# Patient Record
Sex: Female | Born: 1935 | Race: White | Hispanic: No | Smoking: Never smoker
Health system: Southern US, Community
[De-identification: ages and names within clinical notes are randomized; demographics above are authoritative.]

## PROBLEM LIST (undated history)

## (undated) DIAGNOSIS — G20 Parkinson's disease: Secondary | ICD-10-CM

## (undated) DIAGNOSIS — M6281 Muscle weakness (generalized): Secondary | ICD-10-CM

## (undated) DIAGNOSIS — I3139 Other pericardial effusion (noninflammatory): Secondary | ICD-10-CM

## (undated) DIAGNOSIS — F0390 Unspecified dementia without behavioral disturbance: Secondary | ICD-10-CM

## (undated) DIAGNOSIS — I639 Cerebral infarction, unspecified: Secondary | ICD-10-CM

## (undated) DIAGNOSIS — I313 Pericardial effusion (noninflammatory): Secondary | ICD-10-CM

## (undated) DIAGNOSIS — F329 Major depressive disorder, single episode, unspecified: Secondary | ICD-10-CM

## (undated) DIAGNOSIS — N183 Chronic kidney disease, stage 3 unspecified: Secondary | ICD-10-CM

## (undated) DIAGNOSIS — I10 Essential (primary) hypertension: Secondary | ICD-10-CM

## (undated) DIAGNOSIS — G20A1 Parkinson's disease without dyskinesia, without mention of fluctuations: Secondary | ICD-10-CM

## (undated) DIAGNOSIS — G9340 Encephalopathy, unspecified: Secondary | ICD-10-CM

## (undated) DIAGNOSIS — G459 Transient cerebral ischemic attack, unspecified: Secondary | ICD-10-CM

## (undated) DIAGNOSIS — F32A Depression, unspecified: Secondary | ICD-10-CM

## (undated) DIAGNOSIS — I69390 Apraxia following cerebral infarction: Secondary | ICD-10-CM

## (undated) DIAGNOSIS — E785 Hyperlipidemia, unspecified: Secondary | ICD-10-CM

## (undated) DIAGNOSIS — D509 Iron deficiency anemia, unspecified: Secondary | ICD-10-CM

## (undated) DIAGNOSIS — N390 Urinary tract infection, site not specified: Secondary | ICD-10-CM

## (undated) DIAGNOSIS — R001 Bradycardia, unspecified: Secondary | ICD-10-CM

## (undated) DIAGNOSIS — E875 Hyperkalemia: Secondary | ICD-10-CM

## (undated) HISTORY — PX: ABDOMINAL HYSTERECTOMY: SHX81

## (undated) HISTORY — PX: CHOLECYSTECTOMY: SHX55

## (undated) HISTORY — PX: APPENDECTOMY: SHX54

---

## 2008-06-29 ENCOUNTER — Other Ambulatory Visit: Payer: Self-pay

## 2008-06-29 ENCOUNTER — Inpatient Hospital Stay: Payer: Self-pay | Admitting: Internal Medicine

## 2008-07-05 ENCOUNTER — Other Ambulatory Visit: Payer: Self-pay

## 2008-07-05 ENCOUNTER — Emergency Department: Payer: Self-pay | Admitting: Internal Medicine

## 2011-12-05 ENCOUNTER — Emergency Department: Payer: Self-pay | Admitting: Emergency Medicine

## 2014-05-03 ENCOUNTER — Inpatient Hospital Stay: Payer: Self-pay | Admitting: Internal Medicine

## 2014-05-03 LAB — CK TOTAL AND CKMB (NOT AT ARMC)
CK, Total: 88 U/L
CK-MB: <0.5 ng/mL — ABNORMAL LOW (ref 0.5–3.6)

## 2014-05-03 LAB — URINALYSIS, COMPLETE
Bacteria: NONE SEEN
Bilirubin,UR: NEGATIVE
Glucose,UR: NEGATIVE mg/dL (ref 0–75)
Leukocyte Esterase: NEGATIVE
NITRITE: NEGATIVE
PH: 8 (ref 4.5–8.0)
RBC,UR: 44 /HPF (ref 0–5)
Specific Gravity: 1.008 (ref 1.003–1.030)
Squamous Epithelial: <1
WBC UR: 2 /HPF (ref 0–5)

## 2014-05-03 LAB — CBC
HCT: 39 % (ref 35.0–47.0)
HGB: 12.6 g/dL (ref 12.0–16.0)
MCH: 26.9 pg (ref 26.0–34.0)
MCHC: 32.3 g/dL (ref 32.0–36.0)
MCV: 83 fL (ref 80–100)
Platelet: 149 10*3/uL — ABNORMAL LOW (ref 150–440)
RBC: 4.69 10*6/uL (ref 3.80–5.20)
RDW: 19.6 % — AB (ref 11.5–14.5)
WBC: 8.6 10*3/uL (ref 3.6–11.0)

## 2014-05-03 LAB — COMPREHENSIVE METABOLIC PANEL
ALK PHOS: 70 U/L
ALT: 12 U/L (ref 12–78)
Albumin: 3.5 g/dL (ref 3.4–5.0)
Anion Gap: 7 (ref 7–16)
BUN: 20 mg/dL — AB (ref 7–18)
Bilirubin,Total: 0.5 mg/dL (ref 0.2–1.0)
Calcium, Total: 8.4 mg/dL — ABNORMAL LOW (ref 8.5–10.1)
Chloride: 109 mmol/L — ABNORMAL HIGH (ref 98–107)
Co2: 24 mmol/L (ref 21–32)
Creatinine: 1.09 mg/dL (ref 0.60–1.30)
EGFR (African American): 56 — ABNORMAL LOW
GFR CALC NON AF AMER: 48 — AB
GLUCOSE: 97 mg/dL (ref 65–99)
Osmolality: 282 (ref 275–301)
POTASSIUM: 4.4 mmol/L (ref 3.5–5.1)
SGOT(AST): 29 U/L (ref 15–37)
Sodium: 140 mmol/L (ref 136–145)
Total Protein: 7.5 g/dL (ref 6.4–8.2)

## 2014-05-03 LAB — TROPONIN I: Troponin-I: <0.02 ng/mL

## 2014-05-04 ENCOUNTER — Ambulatory Visit: Payer: Self-pay | Admitting: Neurology

## 2014-05-04 LAB — LIPID PANEL
CHOLESTEROL: 157 mg/dL (ref 0–200)
HDL: 79 mg/dL — AB (ref 40–60)
Ldl Cholesterol, Calc: 68 mg/dL (ref 0–100)
Triglycerides: 52 mg/dL (ref 0–200)
VLDL Cholesterol, Calc: 10 mg/dL (ref 5–40)

## 2014-05-05 LAB — PHENYTOIN LEVEL, TOTAL: DILANTIN: 14 ug/mL (ref 10.0–20.0)

## 2014-05-05 LAB — AMMONIA: AMMONIA, PLASMA: 11 umol/L (ref 11–32)

## 2014-05-11 ENCOUNTER — Emergency Department: Payer: Self-pay | Admitting: Emergency Medicine

## 2014-05-11 LAB — COMPREHENSIVE METABOLIC PANEL
ALT: 14 U/L
ANION GAP: 7 (ref 7–16)
Albumin: 3.1 g/dL — ABNORMAL LOW (ref 3.4–5.0)
Alkaline Phosphatase: 69 U/L
BUN: 39 mg/dL — ABNORMAL HIGH (ref 7–18)
Bilirubin,Total: 0.3 mg/dL (ref 0.2–1.0)
Calcium, Total: 8.1 mg/dL — ABNORMAL LOW (ref 8.5–10.1)
Chloride: 105 mmol/L (ref 98–107)
Co2: 27 mmol/L (ref 21–32)
Creatinine: 1.38 mg/dL — ABNORMAL HIGH (ref 0.60–1.30)
EGFR (African American): 42 — ABNORMAL LOW
EGFR (Non-African Amer.): 36 — ABNORMAL LOW
Glucose: 101 mg/dL — ABNORMAL HIGH (ref 65–99)
OSMOLALITY: 287 (ref 275–301)
Potassium: 5 mmol/L (ref 3.5–5.1)
SGOT(AST): 27 U/L (ref 15–37)
SODIUM: 139 mmol/L (ref 136–145)
Total Protein: 6.7 g/dL (ref 6.4–8.2)

## 2014-05-11 LAB — CBC
HCT: 35.9 % (ref 35.0–47.0)
HGB: 11.5 g/dL — ABNORMAL LOW (ref 12.0–16.0)
MCH: 26.9 pg (ref 26.0–34.0)
MCHC: 32 g/dL (ref 32.0–36.0)
MCV: 84 fL (ref 80–100)
Platelet: 176 10*3/uL (ref 150–440)
RBC: 4.27 10*6/uL (ref 3.80–5.20)
RDW: 18.8 % — AB (ref 11.5–14.5)
WBC: 7.2 10*3/uL (ref 3.6–11.0)

## 2014-05-30 ENCOUNTER — Inpatient Hospital Stay: Payer: Self-pay | Admitting: Internal Medicine

## 2014-05-30 LAB — BASIC METABOLIC PANEL
Anion Gap: 7 (ref 7–16)
BUN: 24 mg/dL — ABNORMAL HIGH (ref 7–18)
CALCIUM: 8.8 mg/dL (ref 8.5–10.1)
CREATININE: 1.63 mg/dL — AB (ref 0.60–1.30)
Chloride: 106 mmol/L (ref 98–107)
Co2: 26 mmol/L (ref 21–32)
EGFR (African American): 34 — ABNORMAL LOW
EGFR (Non-African Amer.): 29 — ABNORMAL LOW
Glucose: 169 mg/dL — ABNORMAL HIGH (ref 65–99)
OSMOLALITY: 286 (ref 275–301)
POTASSIUM: 4.1 mmol/L (ref 3.5–5.1)
SODIUM: 139 mmol/L (ref 136–145)

## 2014-05-30 LAB — CBC WITH DIFFERENTIAL/PLATELET
BASOS ABS: 0.1 10*3/uL (ref 0.0–0.1)
Basophil %: 0.5 %
EOS ABS: 0.1 10*3/uL (ref 0.0–0.7)
Eosinophil %: 0.8 %
HCT: 37.4 % (ref 35.0–47.0)
HGB: 11.9 g/dL — ABNORMAL LOW (ref 12.0–16.0)
LYMPHS PCT: 9.5 %
Lymphocyte #: 1.1 10*3/uL (ref 1.0–3.6)
MCH: 27.2 pg (ref 26.0–34.0)
MCHC: 31.8 g/dL — AB (ref 32.0–36.0)
MCV: 86 fL (ref 80–100)
MONO ABS: 1 x10 3/mm — AB (ref 0.2–0.9)
Monocyte %: 8.1 %
Neutrophil #: 9.6 10*3/uL — ABNORMAL HIGH (ref 1.4–6.5)
Neutrophil %: 81.1 %
Platelet: 189 10*3/uL (ref 150–440)
RBC: 4.37 10*6/uL (ref 3.80–5.20)
RDW: 17.1 % — ABNORMAL HIGH (ref 11.5–14.5)
WBC: 11.8 10*3/uL — ABNORMAL HIGH (ref 3.6–11.0)

## 2014-05-30 LAB — URINALYSIS, COMPLETE
BACTERIA: NONE SEEN
BILIRUBIN, UR: NEGATIVE
Glucose,UR: NEGATIVE mg/dL (ref 0–75)
Nitrite: NEGATIVE
Ph: 5 (ref 4.5–8.0)
Protein: 100
RBC,UR: 48 /HPF (ref 0–5)
SQUAMOUS EPITHELIAL: NONE SEEN
Specific Gravity: 1.015 (ref 1.003–1.030)
WBC UR: 3064 /HPF (ref 0–5)

## 2014-05-30 LAB — TROPONIN I

## 2014-05-30 LAB — CLOSTRIDIUM DIFFICILE(ARMC)

## 2014-05-31 LAB — BASIC METABOLIC PANEL
ANION GAP: 6 — AB (ref 7–16)
BUN: 26 mg/dL — ABNORMAL HIGH (ref 7–18)
CREATININE: 1.62 mg/dL — AB (ref 0.60–1.30)
Calcium, Total: 8 mg/dL — ABNORMAL LOW (ref 8.5–10.1)
Chloride: 107 mmol/L (ref 98–107)
Co2: 25 mmol/L (ref 21–32)
EGFR (African American): 34 — ABNORMAL LOW
GFR CALC NON AF AMER: 30 — AB
Glucose: 112 mg/dL — ABNORMAL HIGH (ref 65–99)
Osmolality: 281 (ref 275–301)
Potassium: 3.5 mmol/L (ref 3.5–5.1)
SODIUM: 138 mmol/L (ref 136–145)

## 2014-05-31 LAB — CBC WITH DIFFERENTIAL/PLATELET
Basophil #: 0 10*3/uL (ref 0.0–0.1)
Basophil %: 0.4 %
EOS ABS: 0.2 10*3/uL (ref 0.0–0.7)
Eosinophil %: 2.4 %
HCT: 33.5 % — ABNORMAL LOW (ref 35.0–47.0)
HGB: 10.8 g/dL — AB (ref 12.0–16.0)
Lymphocyte #: 1.8 10*3/uL (ref 1.0–3.6)
Lymphocyte %: 18.7 %
MCH: 27.7 pg (ref 26.0–34.0)
MCHC: 32.3 g/dL (ref 32.0–36.0)
MCV: 86 fL (ref 80–100)
MONO ABS: 1.1 x10 3/mm — AB (ref 0.2–0.9)
Monocyte %: 11.2 %
Neutrophil #: 6.6 10*3/uL — ABNORMAL HIGH (ref 1.4–6.5)
Neutrophil %: 67.3 %
PLATELETS: 156 10*3/uL (ref 150–440)
RBC: 3.92 10*6/uL (ref 3.80–5.20)
RDW: 16.8 % — ABNORMAL HIGH (ref 11.5–14.5)
WBC: 9.8 10*3/uL (ref 3.6–11.0)

## 2014-06-02 LAB — URINE CULTURE

## 2014-06-04 LAB — CULTURE, BLOOD (SINGLE)

## 2015-01-12 ENCOUNTER — Inpatient Hospital Stay: Payer: Self-pay | Admitting: Internal Medicine

## 2015-01-12 LAB — COMPREHENSIVE METABOLIC PANEL
ALK PHOS: 76 U/L
ANION GAP: 8 (ref 7–16)
Albumin: 4.1 g/dL
BUN: 36 mg/dL — ABNORMAL HIGH
Bilirubin,Total: 0.7 mg/dL
CALCIUM: 9.2 mg/dL
Chloride: 107 mmol/L
Co2: 25 mmol/L
Creatinine: 1.62 mg/dL — ABNORMAL HIGH
EGFR (African American): 34 — ABNORMAL LOW
GFR CALC NON AF AMER: 30 — AB
Glucose: 130 mg/dL — ABNORMAL HIGH
Potassium: 4.6 mmol/L
SGOT(AST): 23 U/L
Sodium: 140 mmol/L
Total Protein: 7 g/dL

## 2015-01-12 LAB — URINALYSIS, COMPLETE
BILIRUBIN, UR: NEGATIVE
GLUCOSE, UR: NEGATIVE mg/dL (ref 0–75)
NITRITE: NEGATIVE
Ph: 5 (ref 4.5–8.0)
Protein: 30
RBC,UR: NONE SEEN /HPF (ref 0–5)
Specific Gravity: 1.018 (ref 1.003–1.030)
Squamous Epithelial: 3

## 2015-01-12 LAB — CBC
HCT: 34.7 % — ABNORMAL LOW (ref 35.0–47.0)
HGB: 11.8 g/dL — ABNORMAL LOW (ref 12.0–16.0)
MCH: 31.1 pg (ref 26.0–34.0)
MCHC: 34 g/dL (ref 32.0–36.0)
MCV: 91 fL (ref 80–100)
Platelet: 144 10*3/uL — ABNORMAL LOW (ref 150–440)
RBC: 3.8 10*6/uL (ref 3.80–5.20)
RDW: 14.6 % — ABNORMAL HIGH (ref 11.5–14.5)
WBC: 8.2 10*3/uL (ref 3.6–11.0)

## 2015-01-12 LAB — TROPONIN I: Troponin-I: 0.03 ng/mL

## 2015-01-13 LAB — BASIC METABOLIC PANEL
ANION GAP: 9 (ref 7–16)
BUN: 41 mg/dL — ABNORMAL HIGH
CALCIUM: 9 mg/dL
CO2: 25 mmol/L
Chloride: 108 mmol/L
Creatinine: 1.71 mg/dL — ABNORMAL HIGH
EGFR (African American): 32 — ABNORMAL LOW
EGFR (Non-African Amer.): 28 — ABNORMAL LOW
GLUCOSE: 116 mg/dL — AB
Potassium: 4.4 mmol/L
Sodium: 142 mmol/L

## 2015-01-13 LAB — CBC WITH DIFFERENTIAL/PLATELET
Basophil #: 0.1 10*3/uL (ref 0.0–0.1)
Basophil %: 0.6 %
EOS PCT: 0.4 %
Eosinophil #: 0 10*3/uL (ref 0.0–0.7)
HCT: 31.4 % — ABNORMAL LOW (ref 35.0–47.0)
HGB: 10.7 g/dL — ABNORMAL LOW (ref 12.0–16.0)
Lymphocyte #: 1.6 10*3/uL (ref 1.0–3.6)
Lymphocyte %: 17.6 %
MCH: 31.2 pg (ref 26.0–34.0)
MCHC: 34.1 g/dL (ref 32.0–36.0)
MCV: 92 fL (ref 80–100)
Monocyte #: 1.1 x10 3/mm — ABNORMAL HIGH (ref 0.2–0.9)
Monocyte %: 12.3 %
NEUTROS ABS: 6.2 10*3/uL (ref 1.4–6.5)
NEUTROS PCT: 69.1 %
Platelet: 125 10*3/uL — ABNORMAL LOW (ref 150–440)
RBC: 3.43 10*6/uL — AB (ref 3.80–5.20)
RDW: 14.6 % — ABNORMAL HIGH (ref 11.5–14.5)
WBC: 9 10*3/uL (ref 3.6–11.0)

## 2015-01-14 LAB — BASIC METABOLIC PANEL
ANION GAP: 8 (ref 7–16)
BUN: 34 mg/dL — AB
Calcium, Total: 8.7 mg/dL — ABNORMAL LOW
Chloride: 112 mmol/L — ABNORMAL HIGH
Co2: 23 mmol/L
Creatinine: 1.38 mg/dL — ABNORMAL HIGH
EGFR (Non-African Amer.): 36 — ABNORMAL LOW
GFR CALC AF AMER: 42 — AB
Glucose: 89 mg/dL
Potassium: 4 mmol/L
SODIUM: 143 mmol/L

## 2015-01-15 LAB — BASIC METABOLIC PANEL
ANION GAP: 10 (ref 7–16)
BUN: 24 mg/dL — ABNORMAL HIGH
CHLORIDE: 112 mmol/L — AB
Calcium, Total: 8.8 mg/dL — ABNORMAL LOW
Co2: 22 mmol/L
Creatinine: 1.09 mg/dL — ABNORMAL HIGH
EGFR (African American): 56 — ABNORMAL LOW
EGFR (Non-African Amer.): 48 — ABNORMAL LOW
Glucose: 86 mg/dL
Potassium: 4.6 mmol/L
SODIUM: 144 mmol/L

## 2015-01-15 LAB — URINE CULTURE

## 2015-01-17 ENCOUNTER — Emergency Department
Admit: 2015-01-17 | Disposition: A | Discharge status: Home / Self Care | Payer: Self-pay | Admitting: Emergency Medicine

## 2015-01-22 ENCOUNTER — Emergency Department
Admit: 2015-01-22 | Disposition: A | Discharge status: Skilled Nursing Facility | Payer: Self-pay | Admitting: Emergency Medicine

## 2015-02-10 NOTE — Discharge Summary (Signed)
PATIENT NAME:  Stacey Baker, Stacey Baker DATE OF BIRTH:  05-19-1932  DATE OF ADMISSION:  05/03/2014 DATE OF DISCHARGE:  05/05/2014  ADMITTING DIAGNOSIS: Altered mental status.  DISCHARGE DIAGNOSES: 1. Seizure. 2. Malignant hypertension. 3. Postictal encephalopathy, resolved. 4. Acute urinary retention resolved. 5. Generalized weakness. 6. Dementia with behavioral issues. 7. History of stroke with right upper extremity weakness. 8. Slurring speech.  9. Parkinson disease with dementia. 10. Hypertension. 11. Hyperlipidemia.  DISCHARGE CONDITION: Stable.  DISCHARGE MEDICATIONS: The patient is to continue vitamin B12 at 1000 mcg p.o. daily, iron sulfate 325 mg p.o. twice daily, metoprolol tartrate 25 mg p.o. twice daily, carbidopa/levodopa 25/100 one tablet 4 times daily, Tylenol 325 mg 2 tablets every 4 hours as needed, Robafen 5 mL every 4 hours as needed, vancomycin 250 mg in 5 mL oral solution 10 mL 3 times daily as previously scheduled, lisinopril 10 mg p.o. twice daily, this is a new medication. Aspirin 81 mg p.o. daily, this is new medication, and Lipitor 10 mg p.o. at bedtime.   HOME OXYGEN: None.   DIET: 2-gram salt, low-fat, low-cholesterol. Diet consistency: Mechanical soft.   ACTIVITY LIMITATIONS: As tolerated.   REFERRAL: To home health, physical therapy.  FOLLOWUP APPOINTMENT: With primary care physician in 2 days after discharge.  CONSULTANTS: Care management, social work, Dr. Loretha BrasilZeylikman, neurologist. Electroencephalogram done on 05/04/2014 revealed abnormal awake and drowsy electroencephalogram with no indication of epilepsy. There is intermittent generalized slowing according to neurologist.   RADIOLOGIC STUDIES: Chest portable single view x-ray 05/03/2014, showed no active disease. One view x-ray on 05/03/2014 post seizure revealed bibasilar atelectasis, but no acute infiltrates and stable cardiomegaly. CT scan of head without contrast, 05/03/2014 showed chronic  atrophy and change with no acute findings. CT of head without contrast repeated on 05/03/2014 after seizures revealed again no acute abnormality. MRI of the brain 05/04/2014 without contrast revealed motion degraded exam demonstrated no definite acute intracranial abnormality. Chronic changes were noted.   HOSPITAL COURSE: The patient is a 81 year old Caucasian female with a past medical history significant for history of Parkinson disease, history of dementia, who presents to the hospital with confusion. Please refer to Dr. Jarrett SohoHower's admission note on 05/03/2014. Apparently the patient was having worsening of slurring speech and was brought to the Emergency Room for further evaluation. She had witnessed seizure and was postictal when she was seen later on. She was also noted to be markedly hypertensive with systolic blood pressure reaching 234 initially. Her physical exam was unremarkable.   LABORATORY DATA: The patient's lab data done on arrival to the hospital 05/03/2014 revealed mild elevation of BUN to 20, otherwise BMP was unremarkable. The patient's liver enzymes were normal. Cardiac enzymes: First set negative. The patient's CBC was within normal limits with white blood cell count 8.2, hemoglobin 12.6, platelet count 149,000. Urinalysis was remarkable for 44 red blood cells, 2 white blood cells. More 500 protein, 2+ blood. Otherwise negative for nitrites or leukocyte esterase.  The patient's EKG showed normal sinus rhythm at 79 beats per minute, normal axis, no significant changes were noted. The patient was admitted to the hospital for further evaluation and she was loaded with Dilantin IV. She was seen by a neurologist, Dr. Loretha BrasilZeylikman, the next day on 05/04/2014 and underwent electroencephalogram as well as MRI of the brain. MRI of the brain did not show any acute stroke, electroencephalogram was negative for seizure activity. Neurologist saw patient in consultation, felt that this a suspected first  time seizure for the  patient, who does not have any history of seizures. He recommended to hold off any antiepileptic medication specifically in the setting with MRI not showing any acute abnormalities. According to Dr. Loretha Brasil, blood pressure control should be done and follow up with neurology as outpatient in the next 3-6 weeks after discharge. The patient did very well as time progressed with medical therapy. She was much more alert the next day on 05/05/2014 and was felt to be ready to discharge back to facility. She was evaluated by physical therapist and recommended moderate assistance on the day of discharge.   VITALS SIGNS: Her vitals were stable with temperature of 97.6, pulse was 85, respiration rate was 18, creatinine 13, blood pressure 133/76, saturation was 96% on room air at rest.   The patient is being discharged to assisted living facility with the above-mentioned medications and followup. In regards to blood pressure management, her blood pressure was very poorly controlled on arrival to the hospital, could be stress related. The patient was initiated on new blood pressure medications, lisinopril 10 mg twice daily dose. It is recommended to advance lisinopril as needed to get her blood pressure under control. In regards to history of stroke, the patient was advised to continue aspirin as well as Lipitor. For history of dementia with behavioral issues, the patient is to follow up with her primary care physician for further recommendations. For Parkinson disease, she is to continue her outpatient medications. She is being discharged with the above-mentioned medications and followup.  TIME SPENT: 40 minutes.     ____________________________ Katharina Caper, MD rv:lt D: 05/05/2014 20:59:27 ET T: 05/06/2014 06:01:08 ET JOB#: [id number]  cc: Katharina Caper, MD, <Dictator> Keyra Virella MD ELECTRONICALLY SIGNED 05/26/2014 15:09

## 2015-02-10 NOTE — H&P (Signed)
PATIENT NAME:  Glorianne ManchesterREIER, Corie MR#:  [id number] DATE OF BIRTH:  1932-08-04  DATE OF ADMISSION:  05/03/2014  REFERRING PHYSICIAN:  Dr. Lenard LancePaduchowski   PRIMARY CARE PHYSICIAN: Nonlocal.  [facility]   CHIEF COMPLAINT: Worsening slurred speech.   HISTORY OF PRESENT ILLNESS:  This 81 year old Caucasian female with a history of Parkinson's, dementia, CVA with residual right upper extremity weakness, as well as history of slurred speech, hypertension, hyperlipidemia, presenting with worsening slurred speech. Unable to provide any information given current mental status.  In the Emergency Department, she had a witnessed seizure and has essentially been postictal from that time. On arrival, she was noted to be markedly hypertensive, blood pressure 234/90 which has subsequently improved. Once again, unable to provide any meaningful information given the current mental status.   REVIEW OF SYSTEMS:  Unable to obtain given mental status.   PAST MEDICAL HISTORY: Parkinson's, CVA with right upper extremity weakness, as well as slurred speech at baseline, hypertension, hyperlipidemia.   SOCIAL HISTORY: Note remote tobacco abuse. No alcohol or drug use documented.   FAMILY HISTORY: Positive for lung cancer as well as diabetes.   ALLERGIES: TO PENICILLIN AND PLAVIX.   HOME MEDICATIONS: Include MPAP 325 two tablets p.o. q. 4 hours as needed for pain, carbidopa levodopa 25/100 mg 1 tablet p.o. 4 times a day, metoprolol 25 mg p.o. b.i.d., Robafen 5 mg q. 4 hours as needed for cough, vancomycin 250 mg/mL 10 mL 3 times daily, Feosol 325 mg p.o. b.i.d., vitamin B12 at 1000 mcg p.o. daily.   VITAL SIGNS: Temperature 99.6, heart rate 79, respirations 16, blood pressure 234/90 on arrival, currently 171/93, saturating (Dictation Anomaly) on room air. Weight 63.5 kg, BMI 22.6.   PHYSICAL EXAMINATION: GENERAL: Ill-appearing Caucasian female, currently in minimal to moderate distress given  mental status.  HEAD: Normocephalic, atraumatic.  EYES: Pupils equal, round, and reactive to light. Extraocular muscles unable to fully assess. No scleral icterus.  MOUTH: Moist mucosal membrane. Dentition intact. No abscess noted.  EARS, NOSE, AND THROAT:  Clear without exudates. No external lesions.  NECK: Supple. No thyromegaly. No nodules. No JVD.  PULMONARY: Clear to auscultation bilaterally without wheezes, rales, rhonchi.  No use of accessory muscles. Good respiratory effort.  CHEST: Nontender to palpation.  CARDIOVASCULAR: S1, S2, regular rate and rhythm. No murmurs, rubs, or gallops. No edema. Pedal pulses 2+ bilaterally.  GASTROINTESTINAL: Soft, nontender, nondistended. No masses. Positive bowel sounds. No hepatosplenomegaly.  MUSCULOSKELETAL: No swelling, clubbing, or edema. Right upper extremity contraction. Otherwise passive range of motion full in all extremities.  NEUROLOGICAL: Unable to fully assess given the patient's current mental status. She is mumbling incoherently, unable to follow any commands, unable to hold any conversation. She does have spontaneous movement of all extremities. Reflexes intact.  SKIN: No ulcerations, lesions, rash or cyanosis. Skin warm, dry. Turgor intact.  PSYCHIATRIC: Unable to fully assess given patient's mental status. Currently, once again mumbling incoherently in bed, unable to follow any commands.   IMAGING DATA:  CT head performed, no acute intracranial process. Chest x-ray performed, no acute cardiopulmonary process.   LABORATORY DATA: Sodium 140, potassium 4.4, chloride 109, bicarbonate 24, BUN 20, creatinine 1.09, glucose 97. Her LFTs within normal limits. WBC 8.6, hemoglobin 12.6, platelets of 149,000. Urinalysis negative for evidence of an infection.   ASSESSMENT AND PLAN: A 81 year old Caucasian female with a history of Parkinson's, dementia, as well as history of cerebrovascular accident with right upper extremity weakness and slurred  speech presenting with worsening slurred speech. In the Emergency Department noted to be markedly hypertensive, blood pressure was 234/90 on arrival. Had a witnessed seizure, now currently postictal.  1. Hypertensive emergency indicated by elevated blood pressure as well as neurological changes. Blood pressure has improved, treat with hydralazine with goal blood pressure systolic less than 180. Continue home medications for blood pressure control.  2. Seizure/encephalopathy.  Most likely related to elevated blood pressure. We will do neuro checks q. 4 hours. Initiate aspirin and statin therapy. We will check an MRI to rule out a CVA, place on telemetry. Also consult neurology. She was loaded with Dilantin in the ED.  If has any further seizure activity we will dose with Ativan 2 mg IV.  3. Hyperlipidemia. Continue statin therapy. 4. Deep venous thrombosis prophylaxis with heparin subcutaneous.   CODE STATUS: The patient is full code.   TIME SPENT: 45 minutes.    ____________________________ Cletis Athens. Hower, MD dkh:dd D: 05/03/2014 21:07:20 ET T: 05/03/2014 21:53:27 ET JOB#: [id number]  cc: Cletis Athens. Hower, MD, <Dictator> DAVID Synetta Shadow MD ELECTRONICALLY SIGNED 05/03/2014 23:40

## 2015-02-10 NOTE — Discharge Summary (Signed)
PATIENT NAME:  Stacey Baker, Stacey Baker MR#:  [id number] DATE OF BIRTH:  Mar 06, 1932  DATE OF ADMISSION:  05/30/2014 DATE OF DISCHARGE:  06/01/2014  PRESENTING COMPLAINT: Sent in from facility with hypotension.   DISCHARGE DIAGNOSES: 1.  Sepsis due to urinary tract infection and Clostridium difficile diarrhea.  2.  Hypotension, resolved.   CODE STATUS: FULL.  DISCHARGE MEDICATIONS: 1.  Vitamin B12 1000 mcg p.o. daily.  2.  Ferrous sulfate 325 mg p.o. b.i.d.  3.  Metoprolol 25 mg b.i.d.  4.  Carbidopa/levodopa 25/100 one tablet 4 times a day. 5.  Mapap 325 mg 2 tablets every 4 hours as needed.  6.  Robafen 5 mL every 4 hours as needed.  7.  Lisinopril 10 mg 1 tablet b.i.d.  8.  Aspirin 81 mg daily.  9.  Lipitor 10 mg once a day at bedtime.  10.  Metronidazole 500 mg every 8 hours.  11.  Keflex 250 mg p.o. b.i.d.  12.  Probiotic capsule 1 t.i.d. with meals.   DISCHARGE INSTRUCTIONS: 1.  Physical therapy.  2.  Home health PT has been set up.  3.  Follow up with your primary care physician in 1 to 2 weeks.   DIAGNOSTIC DATA: White count at discharge is 9.8, H and H 10.8 and 33.5, and platelet count 156,000. Creatinine 1.6, sodium 138, potassium 3.5. Blood cultures negative in 18 to 24 hours. C. difficile is positive. Urine culture ID results still pending.   BRIEF SUMMARY OF HOSPITAL COURSE: Ms. Stacey Baker is an 81 year old Caucasian female who comes in from her assisted living facility with hypotension. She was found to have symptoms suggestive of sepsis. She was admitted with: 1.  Sepsis which is secondary to UTI and C. diff colitis. She was started on IV Rocephin and p.o. Flagyl. Sepsis resolved. IV fluids were discontinued. She is tolerating p.o. diet. She remained afebrile and white count was normal. Her blood cultures remained negative.  2.  Urinary tract infection. Urine culture is growing nothing so far. She will finish up a course of Keflex. 3.  C. diff diarrhea, on p.o. Flagyl. Her diarrhea  resolved. She will take probiotic.  4.  History of Parkinson disease. Continue carbidopa/levodopa. 5.  History of hypertension. Resumed lisinopril and beta blockers.   Hospital stay otherwise remained stable. Physical therapy recommended rehabilitation; however, [facility] can take her with physical therapy, which has been arranged.   TIME SPENT: 40 minutes.   ____________________________ Wylie HailSona A. Allena KatzPatel, MD sap:sb D: 06/01/2014 13:35:50 ET T: 06/01/2014 14:14:36 ET JOB#: [id number]  cc: Ariday Brinker A. Allena KatzPatel, MD, <Dictator> Willow OraSONA A Delta Pichon MD ELECTRONICALLY SIGNED 06/09/2014 14:46

## 2015-02-10 NOTE — H&P (Signed)
PATIENT NAME:  Stacey Baker, Stacey Baker MR#:  [id number] DATE OF BIRTH:  Aug 12, 1932  DATE OF ADMISSION:  05/30/2014  PRIMARY CARE PHYSICIAN: Patient resides in [facility].  REFERRING PHYSICIAN: Dr. Derrill Kay  CHIEF COMPLAINT: Sent from nursing home for hypotension.  HISTORY OF PRESENT ILLNESS: Stacey Baker is an 81 year old female with history of dementia, previous history of CVA, Parkinson's disease, hypertension, hyperlipidemia. Was found to be hypotensive at the nursing home. Considering this, patient is sent to the Emergency Department. In the Emergency Department, patient was found to have normal blood pressure; however, urine analysis is consistent with strong urinary tract infection with a WBC count of 3000 with 2+ leukocyte esterase. Patient also has Clostridium difficile positive. I could not obtain any history from the patient.  Patient is also found to have elevated white blood cell count of 11.8 with a left shift with elevated BUN and creatinine of 24 and 1.63.  The history is mainly obtained from the Emergency Department physician and the previous medical records.   PAST MEDICAL HISTORY:  1.  Parkinson's disease. 2.  CVA with right upper extremity weakness. 3.  Slurred speech at baseline. 4.  Hypertension. 5.  Hyperlipidemia 6.  Dementia.  PAST SURGICAL HISTORY: 1.  Hemorrhoidectomy. 2.  Hernia repair. 3.  Hysterectomy. 4.  Cholecystectomy. 5.  Tonsillectomy. 6.  Appendectomy.  ALLERGIES:  1.  PENICILLIN. 2.  PLAVIX.  HOME MEDICATIONS:  1.  Vitamin B12, 1000 mcg once a day. 2.  Robafen every 4 hours as needed. 3.  Metoprolol 25 mg 2 times a day. 4.  325 mg 2 tablets every 4 hours as needed. 5.  Lisinopril 10 mg 2 times a day. 6.  Lipitor 10 mg daily. 7.  Ferrous sulfate 325 mg 2 tablets daily. 8.  Carbidopa-levodopa 25 mg 4 times a day. 9.  Aspirin 81 mg once a day.  SOCIAL HISTORY: Remote history of tobacco use. No history of alcohol and drug use  currently. Currently lives at the [facility].   FAMILY HISTORY: Positive for lung cancer.  REVIEW OF SYSTEMS: Could not be obtained from the patient secondary to patient's dementia, possibly some underlying altered mental status.   PHYSICAL EXAMINATION:  GENERAL: This is a thin-built female lying down in the bed, not in distress.  VITAL SIGNS: Temperature 98.2, pulse 75, blood pressure 164/65, respiratory rate of 20, oxygen saturation 95% on room air.  HEENT: Head normocephalic, atraumatic. No scleral icterus. Conjunctivae normal. Pupils equal, round, and reactive to light. Mucous membranes dry. No pharyngeal erythema.  NECK: Supple. No lymphadenopathy. No JVD. No carotid bruit.  CHEST: Has no focal tenderness.  LUNGS: Bilaterally clear to auscultation.  HEART: S1 and S2 regular. No murmurs are heard.  ABDOMEN: Bowel sounds present. Soft. Has tenderness all over the abdomen. No rebound or guarding.  No hepatosplenomegaly.  EXTREMITIES: No pedal edema. Pulses 2+.  SKIN: No rash or lesions.  MUSCULOSKELETAL: Good range of motion in all the extremities.  NEUROLOGIC:  The patient is oriented to self and place, but not to time. No cranial nerve abnormalities.  Motor 5/5 in upper and lower extremities.   LABORATORY DATA: CBC: WBC of 11.8, hemoglobin 11.9, platelet count of 189,000. CMP: BUN 24, creatinine of 1.63.  UA: 2+ leukocyte esterase, WBC 30-60. Clostridium difficile position.  ASSESSMENT AND PLAN: Stacey Baker is an 81 year old female who comes today sent from the nursing home for hypotension. Is found to be sepsis from urinary tract infection and Clostridium  difficile colitis.   1.  Sepsis secondary to urinary tract infection and Clostridium difficile colitis. Continue with Rocephin and p.o. Flagyl. 2.  Urinary tract infection. Send urine and blood cultures. Followup based on the urine culture, de-escalate the antibiotics. 3.  Clostridium difficile colitis. Start  the patient on p.o. Flagyl. 4.  Parkinson's disease. 5.  Continue the carbidopa-levodopa. 6.  Hypertension. Hold the lisinopril. 7.  Keep the patient on deep venous thrombosis prophylaxis with Lovenox.  TIME SPENT: 50 minutes.    ____________________________ Susa GriffinsPadmaja Shakira Los, MD pv:am D: 05/31/2014 00:21:32 ET T: 05/31/2014 04:33:05 ET JOB#: [id number]  cc: Susa GriffinsPadmaja Saverio Kader, MD, <Dictator> Susa GriffinsPADMAJA Oluwaferanmi Wain MD ELECTRONICALLY SIGNED 06/07/2014 21:33

## 2015-02-10 NOTE — Consult Note (Signed)
PATIENT NAME:  Stacey Baker, Stacey Baker MR#:  [id number] DATE OF BIRTH:  [birth year]  DATE OF CONSULTATION:  05/04/2014  REFERRING PHYSICIAN:   CONSULTING PHYSICIAN:  Pauletta BrownsYuriy Naomia Lenderman, MD   REASON FOR CONSULTATION: Seizure activity.    HISTORY OF PRESENT ILLNESS: A 81 year old Caucasian female with past medical history of Parkinson's disease, dementia, history of a stroke with right hemiparesis, hypertension, hyperlipidemia, presenting with what is suspected to be worsening of slurred speech. The patient was admitted to the Emergency Department. In the Emergency Department, the patient had what is suspected as a generalized tonic-clonic seizure with postictal state. No tongue biting, no urinary incontinence. On presentation, the patient had elevated blood pressure with systolic of 234 and diastolic of 90. Currently, the patient appears to be agitated, but appears to be back to her baseline with history of chronic dementia.   REVIEW OF SYSTEMS: Unable to obtain at this point due to the history of dementia and chronic status.   PAST MEDICAL HISTORY: For stroke with right upper extremity weakness, baseline dysarthria, hypertension, and hyperlipidemia.   SOCIAL HISTORY: No drugs, tobacco or alcohol use.   FAMILY HISTORY: Positive lung cancer, as well as diabetes.   ALLERGIES: INCLUDE PENICILLIN AND PLAVIX.   HOME MEDICATIONS: Include Sinemet, metoprolol, Robafen, vitamin B supplementation.   LABORATORY DATA: Work-up has been reviewed.   MRI of the brain: No acute intracranial abnormality was found, but this was a limited study due to motion artifact.   PHYSICAL EXAMINATION:  VITAL SIGNS: Temperature 98, pulse 67, respirations 19, blood pressure 184/72, pulse oximetry 100.  NEUROLOGIC: The patient could only tell me her name. Could not tell me the date, time and her current location. Appears to be agitated. Question of dysarthria noted, but unaware of her baseline. It was noted in the chart that the  patient does have slurred speech at baseline. Minimal evidence of right facial droop. The patient appears to have extraocular movements to be intact, as she responds to visual threats bilaterally. Motor strength: The patient moves all her extremities symmetrically, questionable drift in the right upper extremity, but difficult to assess, and also a questionable weakness on right lower extremity, but it appears the patient has weakness bilaterally. Reflexes are diminished symmetrical. Sensation: The patient withdraws from painful stimuli bilaterally. Coordination could not be assessed. Gait could not be assessed.   IMPRESSION: A 81 year old female with chronic history of dementia, Parkinson's disease on Sinemet with residual right-sided hemiparesis due to previous stroke, admitted with questionable dysarthria, but found to have seizure activity in the Emergency Department. At that time, the patient's blood pressure was elevated with 230s systolic. Has not started any antiepileptics, suspected to be at baseline at this point. MRI of the brain, even though it was motion artifact in the image, it did not show any acute intracranial abnormality.   PLAN: Suspected first-time seizure from the patient. The patient does not have any history of seizures. At this point, would hold off any antiepileptic medications, specifically in the setting with MRI not showing any acute abnormalities. Blood pressure control is being done right now. Follow up with Neurology as outpatient in about 3 to 6 weeks. The patient also had EEG monitoring that showed diffuse slowing. No abnormal epileptiform activity. For that reason, also, do not want to start any antiepileptic medication.   Thank you. It was a pleasure seeing this patient. Please call with any questions.    ____________________________ Pauletta BrownsYuriy Punam Broussard, MD yz:jr D: 05/04/2014 14:23:41 ET T: 05/04/2014 14:59:41 ET  JOB#: [id number]  cc: Pauletta Browns, MD,  <Dictator> Pauletta Browns MD ELECTRONICALLY SIGNED 05/23/2014 21:14

## 2015-02-18 NOTE — Discharge Summary (Signed)
PATIENT NAME:  Stacey Baker, Stacey Baker MR#:  [id number] DATE OF BIRTH:  09/16/1932  DATE OF ADMISSION:  01/12/2015 DATE OF DISCHARGE:  01/16/2015  ADMITTING DIAGNOSIS: Altered mental status.   DISCHARGE DIAGNOSES: 1. Acute encephalopathy as a result of urinary tract infection, now improved.  2. Acute renal failure on chronic kidney failure, now resolved.  3. Essential hypertension.  4. Hyperlipidemia.  5. Poor oral  intake.  6. Parkinson dementia.  7. 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  8. Hyperlipidemia, unspecified.  9.  IMPORTANT LABORATORY DATA AND EVALUATIONS: Sodium 140, potassium 4.6, chloride 107, bicarbonate 25. WBC 8.2, hemoglobin 11.8.  Urinalysis: WBC 300, leukocyte esterase 3+,. Creatinine on discharge was 1.09. Urine culture showed gram-negative rods, but mixed bacterial results. Urinalysis showed 2+ blood, 3+ leukocytes, WBCs were 300. CT scan of the head without contrast showed atrophy with mild periventricular small vessel disease, stable. No intracranial mass or hemorrhage, extra axial fluid collection, no acute infarct.   HOSPITAL COURSE: Please refer to H and P done by the admitting physician. The patient is an 81 year old white female who currently resides in assisted living, who was brought in with altered mental status and weakness. The patient was noted to have a urinary tract infection. She was admitted to the hospital, given antibiotics and IV fluids. The patient likely has some form of dementia, as well. She had some intermittent confusion during hospitalization. She, at this point, seems to be back at baseline and is stable for discharge. Her urinary tract infection was treated.   DISCHARGE MEDICATIONS: Iron sulfate 325 mg 1 tab p.o. b.i.d., metoprolol tartrate 25 one tab p.o. b.i.d., carbidopa, levodopa 25/100 mg 1 tablet 4 times a day, Tylenol 650 q. 4 p.r.n. for pain, Robafen 5 mL q. 4 hours p.r.n. for cough, lisinopril 10 one tab p.o.  b.i.d., aspirin 81 one tab p.o. daily, Lexapro 5 daily, vitamin B12 1000 mcg daily, Keppra 500 one tab p.o. b.i.d., atorvastatin 10 at bedtime, Voltaren topically to affected area as needed, Megace 10 mL b.i.d., quetiapine 25 mg 1 tab at bedtime, Ceftin 500 one tab p.o. b.i.d. x 4 days.   DISCHARGE INSTRUCTIONS: Home health services with physical therapy.   DIET: Low-sodium, low-fat, low-cholesterol.   ACTIVITY: As tolerated with PT evaluation and treatment.   FOLLOWUP: Follow with primary M.D. in 1-2 weeks.    TIME SPENT ON THIS DISCHARGE: 35 minutes     ____________________________ Serita GritShreyang H. Allena KatzPatel, MD shp:mw D: 01/16/2015 19:55:51 ET T: 01/17/2015 10:55:40 ET JOB#: [id number]  cc: Bonetta Mostek H. Allena KatzPatel, MD, <Dictator> Charise CarwinSHREYANG H Caz Weaver MD ELECTRONICALLY SIGNED 01/19/2015 14:17

## 2015-02-18 NOTE — H&P (Signed)
PATIENT NAME:  Glorianne ManchesterREIER, Trinika MR#:  [id number] DATE OF BIRTH:  1932/05/27  DATE OF ADMISSION:  01/12/2015  REFERRING PHYSICIAN: Gladstone Pihavid Schaevitz, MD  PRIMARY CARE PHYSICIAN: Nonlocal. The patient currently resides at [facility].   CHIEF COMPLAINT: Altered mental status.   HISTORY OF PRESENT ILLNESS: An 81 year old Caucasian female with a history of dementia; hypertension, essential; CVA with residual right upper extremity weakness; presenting with altered mental status. She is, unfortunately, unable to provide any meaningful information given mental status and medical condition. [facility], [facility]. She is being sent to the hospital after having an unwitnessed fall with worsening mental status described mainly as confusion. Upon arrival to the Emergency Department, she was noted to be actively hallucinating; however, she is unable to provide any further information.  REVIEW OF SYSTEMS: Unobtainable given the patient's mental status and medical condition.   PAST MEDICAL HISTORY: Includes Parkinson's; dementia; CVA with residual right upper extremity weakness; hypertension, essential; hyperlipidemia, unspecified.   SOCIAL HISTORY: Uses a walker for ambulation at baseline. Remote smoking history. No alcohol usage. Res[facility].   FAMILY HISTORY: Positive for lung cancer. No documentation of cardiovascular or further pulmonary disorders.   ALLERGIES: PENICILLIN AND PLAVIX.  HOME MEDICATIONS: Aspirin 81 mg p.o. daily; Tylenol 325 mg 2 tablets every 4 hours as needed for pain; lisinopril 10 mg p.o. b.i.d.; Keppra 500 mg p.o. b.i.d.; escitalopram 15 mg p.o. daily; atorvastatin 10 mg p.o. at bedtime; carbidopa and levodopa 25/100 mg p.o. 4 times daily; metoprolol 25 mg p.o. b.i.d.; Voltaren 1% topical gel to the left knee twice daily; ferrous sulfate 325 mg b.i.d.; vitamin B12, 1000 mcg p.o. daily.   PHYSICAL EXAMINATION:  VITAL  SIGNS: Temperature 98.2, heart rate 71, respirations 20, blood pressure 183/72, saturating 92% on room air. Weight 61.8 kg, BMI 22.7.  GENERAL: Chronically ill, frail-appearing Caucasian female currently in minimal to moderate distress given mental status.  HEAD: Normocephalic, atraumatic.  EYES: Pupils equal, round, and reactive to light. Unable to fully assess extraocular muscles given inability to follow commands. No scleral icterus.  MOUTH: Dry mucosal membrane. Dentition intact. No abscess noted. EARS, NOSE, THROAT: Clear without exudates. No external lesions.  NECK: Supple. No thyromegaly. No nodules. No JVD.  PULMONARY: Clear to auscultation bilaterally without wheezes, rales, or rhonchi. No use of accessory muscles. Good respiratory effort.  CHEST: Nontender to palpation.  CARDIOVASCULAR: S1, S2, regular rate and rhythm. No murmurs, rubs, or gallops. No edema. Pedal pulses 2+ bilaterally.  GASTROINTESTINAL: Soft, nontender, nondistended. No masses. Positive bowel sounds. No hepatosplenomegaly.  MUSCULOSKELETAL: No swelling, clubbing, or edema. Range of motion full in all extremities.  NEUROLOGIC: Unable to fully assess given the patient's mental status, medical condition, and inability to follow commands at this time.  SKIN: No ulceration, lesions, rashes, or cyanosis. Skin warm, dry. Turgor intact. PSYCHIATRIC: Unable to fully assess given the patient's mental status and medical condition. Mood and affect agitated, was originally having what appeared to be visual hallucinations upon arrival to the Emergency Department. Those have fortunately subsided at this time. Insight and judgment poor.   LABORATORY DATA: Sodium 140, potassium 4.6, chloride 107, bicarbonate of 25, BUN 36, creatinine 1.62, glucose of 130. LFTs within normal limits. WBC of 8.2, hemoglobin 11.8, platelets of 144,000. Urinalysis: WBCs of 300, leukocyte esterase 3+, epithelial cells 3.   ASSESSMENT AND PLAN: An 81 year old  Caucasian female with a history of dementia, essential hypertension, cerebrovascular accident with residual right  upper extremity weakness, presenting with altered mental status. 1.  Encephalopathy. Likely metabolic in etiology secondary to urinary tract infection, site unspecified. Antibiotic coverage with ceftriaxone. Follow culture data. Adjust accordingly. She does have a history of pan sensitive klebsiella. Will do neurological checks q.4 hours following mental status.  2.  Acute kidney injury. Intravenous fluid hydration. Follow urine output and renal function. Hold nephrotoxic agents.  3.  Hypertension, essential. Hold ACE inhibitors as stated above. Continue with metoprolol.  4.  Hyperlipidemia, unspecified. Continue with statin therapy. 5.  Venous thromboembolism prophylaxis with heparin subcutaneous.  CODE STATUS: The patient is full code.   TIME SPENT: 45 minutes.    ____________________________ Cletis Athens. Heylee Tant, MD dkh:ST D: 01/13/2015 00:16:33 ET T: 01/13/2015 00:46:14 ET JOB#: [id number]  cc: Cletis Athens. Richy Spradley, MD, <Dictator> Laasya Peyton Synetta Shadow MD ELECTRONICALLY SIGNED 01/13/2015 1:38

## 2015-08-27 ENCOUNTER — Encounter: Payer: Self-pay | Admitting: Internal Medicine

## 2015-08-27 ENCOUNTER — Observation Stay
Admission: EM | Admit: 2015-08-27 | Discharge: 2015-08-29 | Payer: Medicare Other | Attending: Internal Medicine | Admitting: Internal Medicine

## 2015-08-27 ENCOUNTER — Emergency Department: Payer: Medicare Other

## 2015-08-27 DIAGNOSIS — I69351 Hemiplegia and hemiparesis following cerebral infarction affecting right dominant side: Secondary | ICD-10-CM | POA: Insufficient documentation

## 2015-08-27 DIAGNOSIS — Z7982 Long term (current) use of aspirin: Secondary | ICD-10-CM | POA: Diagnosis not present

## 2015-08-27 DIAGNOSIS — R531 Weakness: Secondary | ICD-10-CM | POA: Diagnosis not present

## 2015-08-27 DIAGNOSIS — I340 Nonrheumatic mitral (valve) insufficiency: Secondary | ICD-10-CM | POA: Diagnosis not present

## 2015-08-27 DIAGNOSIS — Z888 Allergy status to other drugs, medicaments and biological substances status: Secondary | ICD-10-CM | POA: Diagnosis not present

## 2015-08-27 DIAGNOSIS — G459 Transient cerebral ischemic attack, unspecified: Secondary | ICD-10-CM | POA: Diagnosis present

## 2015-08-27 DIAGNOSIS — Z880 Allergy status to penicillin: Secondary | ICD-10-CM | POA: Diagnosis not present

## 2015-08-27 DIAGNOSIS — R001 Bradycardia, unspecified: Secondary | ICD-10-CM | POA: Insufficient documentation

## 2015-08-27 DIAGNOSIS — Z8249 Family history of ischemic heart disease and other diseases of the circulatory system: Secondary | ICD-10-CM | POA: Diagnosis not present

## 2015-08-27 DIAGNOSIS — I071 Rheumatic tricuspid insufficiency: Secondary | ICD-10-CM | POA: Diagnosis not present

## 2015-08-27 DIAGNOSIS — M6289 Other specified disorders of muscle: Secondary | ICD-10-CM | POA: Diagnosis present

## 2015-08-27 DIAGNOSIS — F329 Major depressive disorder, single episode, unspecified: Secondary | ICD-10-CM | POA: Insufficient documentation

## 2015-08-27 DIAGNOSIS — Z79899 Other long term (current) drug therapy: Secondary | ICD-10-CM | POA: Insufficient documentation

## 2015-08-27 DIAGNOSIS — I6523 Occlusion and stenosis of bilateral carotid arteries: Secondary | ICD-10-CM | POA: Diagnosis not present

## 2015-08-27 DIAGNOSIS — E049 Nontoxic goiter, unspecified: Secondary | ICD-10-CM | POA: Insufficient documentation

## 2015-08-27 DIAGNOSIS — I639 Cerebral infarction, unspecified: Secondary | ICD-10-CM

## 2015-08-27 DIAGNOSIS — G3183 Dementia with Lewy bodies: Secondary | ICD-10-CM | POA: Insufficient documentation

## 2015-08-27 DIAGNOSIS — I69320 Aphasia following cerebral infarction: Secondary | ICD-10-CM | POA: Insufficient documentation

## 2015-08-27 DIAGNOSIS — I371 Nonrheumatic pulmonary valve insufficiency: Secondary | ICD-10-CM | POA: Diagnosis not present

## 2015-08-27 DIAGNOSIS — E785 Hyperlipidemia, unspecified: Secondary | ICD-10-CM | POA: Diagnosis not present

## 2015-08-27 DIAGNOSIS — Z8673 Personal history of transient ischemic attack (TIA), and cerebral infarction without residual deficits: Secondary | ICD-10-CM | POA: Insufficient documentation

## 2015-08-27 DIAGNOSIS — G40909 Epilepsy, unspecified, not intractable, without status epilepticus: Secondary | ICD-10-CM | POA: Insufficient documentation

## 2015-08-27 HISTORY — DX: Parkinson's disease without dyskinesia, without mention of fluctuations: G20.A1

## 2015-08-27 HISTORY — DX: Cerebral infarction, unspecified: I63.9

## 2015-08-27 HISTORY — DX: Unspecified dementia, unspecified severity, without behavioral disturbance, psychotic disturbance, mood disturbance, and anxiety: F03.90

## 2015-08-27 HISTORY — DX: Parkinson's disease: G20

## 2015-08-27 HISTORY — DX: Essential (primary) hypertension: I10

## 2015-08-27 HISTORY — DX: Depression, unspecified: F32.A

## 2015-08-27 HISTORY — DX: Hyperlipidemia, unspecified: E78.5

## 2015-08-27 HISTORY — DX: Major depressive disorder, single episode, unspecified: F32.9

## 2015-08-27 LAB — COMPREHENSIVE METABOLIC PANEL
ALBUMIN: 3.4 g/dL — AB (ref 3.5–5.0)
ALT: <5 U/L — ABNORMAL LOW (ref 14–54)
ANION GAP: 6 (ref 5–15)
AST: 17 U/L (ref 15–41)
Alkaline Phosphatase: 83 U/L (ref 38–126)
BILIRUBIN TOTAL: 0.1 mg/dL — AB (ref 0.3–1.2)
BUN: 45 mg/dL — AB (ref 6–20)
CHLORIDE: 112 mmol/L — AB (ref 101–111)
CO2: 25 mmol/L (ref 22–32)
Calcium: 8.5 mg/dL — ABNORMAL LOW (ref 8.9–10.3)
Creatinine, Ser: 1.51 mg/dL — ABNORMAL HIGH (ref 0.44–1.00)
GFR calc Af Amer: 36 mL/min — ABNORMAL LOW (ref >60)
GFR calc non Af Amer: 31 mL/min — ABNORMAL LOW (ref >60)
GLUCOSE: 126 mg/dL — AB (ref 65–99)
POTASSIUM: 5.1 mmol/L (ref 3.5–5.1)
SODIUM: 143 mmol/L (ref 135–145)
Total Protein: 6.5 g/dL (ref 6.5–8.1)

## 2015-08-27 LAB — CBC
HCT: 33.3 % — ABNORMAL LOW (ref 35.0–47.0)
HEMOGLOBIN: 11.3 g/dL — AB (ref 12.0–16.0)
MCH: 30.5 pg (ref 26.0–34.0)
MCHC: 33.8 g/dL (ref 32.0–36.0)
MCV: 90.2 fL (ref 80.0–100.0)
Platelets: 129 10*3/uL — ABNORMAL LOW (ref 150–440)
RBC: 3.69 MIL/uL — ABNORMAL LOW (ref 3.80–5.20)
RDW: 14.6 % — ABNORMAL HIGH (ref 11.5–14.5)
WBC: 5.6 10*3/uL (ref 3.6–11.0)

## 2015-08-27 LAB — DIFFERENTIAL
BASOS ABS: 0 10*3/uL (ref 0–0.1)
BASOS PCT: 1 %
EOS ABS: 0.2 10*3/uL (ref 0–0.7)
Eosinophils Relative: 4 %
LYMPHS ABS: 1.9 10*3/uL (ref 1.0–3.6)
Lymphocytes Relative: 34 %
Monocytes Absolute: 0.5 10*3/uL (ref 0.2–0.9)
Monocytes Relative: 9 %
NEUTROS PCT: 52 %
Neutro Abs: 2.9 10*3/uL (ref 1.4–6.5)

## 2015-08-27 LAB — APTT: APTT: 28 s (ref 24–36)

## 2015-08-27 LAB — ETHANOL: Alcohol, Ethyl (B): <5 mg/dL (ref <5)

## 2015-08-27 LAB — PROTIME-INR
INR: 1.13
PROTHROMBIN TIME: 14.7 s (ref 11.4–15.0)

## 2015-08-27 LAB — TROPONIN I: Troponin I: <0.03 ng/mL (ref <0.031)

## 2015-08-27 MED ORDER — ASPIRIN 81 MG PO CHEW
324.0000 mg | CHEWABLE_TABLET | Freq: Once | ORAL | Status: AC
  Administered 2015-08-27: 324 mg via ORAL
  Filled 2015-08-27: qty 4

## 2015-08-27 MED ORDER — ONDANSETRON HCL 4 MG/2ML IJ SOLN: 4.0000 mg | Freq: Four times a day (QID) | INTRAMUSCULAR | Status: DC | PRN

## 2015-08-27 MED ORDER — STROKE: EARLY STAGES OF RECOVERY BOOK
Freq: Once | Status: AC
  Administered 2015-08-28: 02:00:00

## 2015-08-27 MED ORDER — ONDANSETRON HCL 4 MG PO TABS: 4.0000 mg | ORAL_TABLET | Freq: Four times a day (QID) | ORAL | Status: DC | PRN

## 2015-08-27 MED ORDER — ACETAMINOPHEN 325 MG PO TABS: 650.0000 mg | ORAL_TABLET | Freq: Four times a day (QID) | ORAL | Status: DC | PRN

## 2015-08-27 MED ORDER — ASPIRIN 300 MG RE SUPP: 300.0000 mg | Freq: Every day | RECTAL | Status: DC

## 2015-08-27 MED ORDER — ASPIRIN 325 MG PO TABS
325.0000 mg | ORAL_TABLET | Freq: Every day | ORAL | Status: DC
  Administered 2015-08-28 – 2015-08-29 (×2): 325 mg via ORAL
  Filled 2015-08-27 (×2): qty 1

## 2015-08-27 MED ORDER — SODIUM CHLORIDE 0.9 % IJ SOLN
3.0000 mL | Freq: Two times a day (BID) | INTRAMUSCULAR | Status: DC
  Administered 2015-08-28 – 2015-08-29 (×4): 3 mL via INTRAVENOUS

## 2015-08-27 MED ORDER — ACETAMINOPHEN 650 MG RE SUPP: 650.0000 mg | Freq: Four times a day (QID) | RECTAL | Status: DC | PRN

## 2015-08-27 MED ORDER — HYDRALAZINE HCL 20 MG/ML IJ SOLN: 10.0000 mg | INTRAMUSCULAR | Status: DC | PRN

## 2015-08-27 NOTE — ED Provider Notes (Signed)
[facility] Emergency Department Provider Note  ____________________________________________  Time seen: On EMS arrival  I have reviewed the triage vital signs and the nursing notes.   HISTORY  Chief Complaint Code Stroke    HPI Rashauna Tep is a 81 y.o. female with history of prior CVA with residual right upper extremity weakness, HTN, parkinson's dementia who presents for evaluation of gradual onset right-sided weakness which began at approximately 5:30 pm, has been constant since onset but improving. Patient reports that she went to the dining hall at 5:30 PM and was not able to eat the cakes appendectomy for her because she had significant weakness in the right arm. She was escorted back to her room where she reports she had some right lower extremity weakness and she was leaning to the right. Symptoms have seemed to improve with time. She denies any recent illness including no cough, sneezing, runny nose, congestion, vomiting, diarrhea, fevers or chills. No chest pain or difficulty breathing. Currently her symptoms are mild. There are no modifying factors.   No past medical history on file.  There are no active problems to display for this patient.   No past surgical history on file.  Current Outpatient Rx  Name  Route  Sig  Dispense  Refill  . acetaminophen (TYLENOL) 325 MG tablet   Oral   Take 650 mg by mouth every 4 (four) hours as needed for mild pain.         Marland Kitchen aspirin EC 81 MG tablet   Oral   Take 81 mg by mouth daily.         Marland Kitchen atorvastatin (LIPITOR) 10 MG tablet   Oral   Take 10 mg by mouth at bedtime.         . carbidopa-levodopa (SINEMET IR) 25-100 MG tablet   Oral   Take 1 tablet by mouth 4 (four) times daily.         . diclofenac sodium (VOLTAREN) 1 % GEL   Topical   Apply 2 g topically 2 (two) times daily. Pt applies to left knee.         . donepezil (ARICEPT) 5 MG tablet   Oral   Take 5 mg by mouth at bedtime.          Marland Kitchen escitalopram (LEXAPRO) 10 MG tablet   Oral   Take 10 mg by mouth daily.         . ferrous sulfate 325 (65 FE) MG tablet   Oral   Take 325 mg by mouth 2 (two) times daily.         . fluticasone (FLONASE) 50 MCG/ACT nasal spray   Each Nare   Place 1 spray into both nostrils daily as needed for rhinitis.         Marland Kitchen guaiFENesin (MUCINEX) 600 MG 12 hr tablet   Oral   Take 600 mg by mouth 2 (two) times daily as needed for to loosen phlegm.         . hydrOXYzine (ATARAX/VISTARIL) 25 MG tablet   Oral   Take 25 mg by mouth every 6 (six) hours as needed.         . levETIRAcetam (KEPPRA) 500 MG tablet   Oral   Take 500 mg by mouth 2 (two) times daily.         Marland Kitchen lisinopril (PRINIVIL,ZESTRIL) 10 MG tablet   Oral   Take 10 mg by mouth 2 (two) times daily.         Marland Kitchen  metoprolol tartrate (LOPRESSOR) 25 MG tablet   Oral   Take 25 mg by mouth 2 (two) times daily.         . mirtazapine (REMERON) 15 MG tablet   Oral   Take 15 mg by mouth at bedtime.         Marland Kitchen. QUEtiapine (SEROQUEL) 25 MG tablet   Oral   Take 25 mg by mouth at bedtime.         . vitamin B-12 (CYANOCOBALAMIN) 1000 MCG tablet   Oral   Take 1,000 mcg by mouth daily.           Allergies Penicillins and Plavix  No family history on file.  Social History Social History  Substance Use Topics  . Smoking status: Not on file  . Smokeless tobacco: Not on file  . Alcohol Use: Not on file    Review of Systems Constitutional: No fever/chills Eyes: No visual changes. ENT: No sore throat. Cardiovascular: Denies chest pain. Respiratory: Denies shortness of breath. Gastrointestinal: No abdominal pain.  No nausea, no vomiting.  No diarrhea.  No constipation. Genitourinary: Negative for dysuria. Musculoskeletal: Negative for back pain. Skin: Negative for rash. Neurological: Negative for headaches, positive for right arm and leg weakness, no numbness.  10-point ROS otherwise  negative.  ____________________________________________   PHYSICAL EXAM: Filed Vitals:   08/27/15 2001 08/27/15 2031 08/27/15 2034 08/27/15 2117  BP:   173/63 155/56  Pulse:   63 62  Temp:  97.8 F (36.6 C)    TempSrc:      Resp:   18 20  Height:      Weight:      SpO2: 100%  100% 99%    VITAL SIGNS: ED Triage Vitals  Enc Vitals Group     BP --      Pulse --      Resp --      Temp --      Temp src --      SpO2 08/27/15 1925 97 %     Weight --      Height --      Head Cir --      Peak Flow --      Pain Score --      Pain Loc --      Pain Edu? --      Excl. in GC? --     Constitutional: Alert and oriented. Well appearing and in no acute distress. Eyes: Conjunctivae are normal. PERRL. EOMI. Head: Atraumatic. Nose: No congestion/rhinnorhea. Mouth/Throat: Mucous membranes are moist.  Oropharynx non-erythematous. Neck: No stridor.   Cardiovascular: Normal rate, regular rhythm. Grossly normal heart sounds.  Good peripheral circulation. Respiratory: Normal respiratory effort.  No retractions. Lungs CTAB. Gastrointestinal: Soft and nontender. No distention.  No CVA tenderness. Genitourinary: deferred Musculoskeletal: No lower extremity tenderness nor edema.  No joint effusions. Neurologic:  Normal speech and language. Face symmetric. Cranial nerves II through XII intact. 5 out of 5 strength in bilateral upper extremities. 3 out of 5 strength in the left lower extremity (which patient reports is chronic), 4 out of 5 strength in the right lower extremity. Sensation intact to light touch throughout. Skin:  Skin is warm, dry and intact. No rash noted. Psychiatric: Mood and affect are normal. Speech and behavior are normal.  ____________________________________________   LABS (all labs ordered are listed, but only abnormal results are displayed)  Labs Reviewed  CBC - Abnormal; Notable for the following:    RBC  3.69 (*)    Hemoglobin 11.3 (*)    HCT 33.3 (*)    RDW  14.6 (*)    Platelets 129 (*)    All other components within normal limits  COMPREHENSIVE METABOLIC PANEL - Abnormal; Notable for the following:    Chloride 112 (*)    Glucose, Bld 126 (*)    BUN 45 (*)    Creatinine, Ser 1.51 (*)    Calcium 8.5 (*)    Albumin 3.4 (*)    ALT <5 (*)    Total Bilirubin 0.1 (*)    GFR calc non Af Amer 31 (*)    GFR calc Af Amer 36 (*)    All other components within normal limits  ETHANOL  PROTIME-INR  APTT  DIFFERENTIAL  TROPONIN I  URINE RAPID DRUG SCREEN, HOSP PERFORMED  URINALYSIS COMPLETEWITH MICROSCOPIC ([facility] ONLY)   ____________________________________________  EKG  ED ECG REPORT I, Gayla Doss, the attending physician, personally viewed and interpreted this ECG.   Date: 08/27/2015  EKG Time: 19:53  Rate: 55  Rhythm: sinus bradycardia  Axis: Normal axis  Intervals:none  ST&T Change: No acute ST elevation  ____________________________________________  RADIOLOGY  CXR  IMPRESSION: No acute cardiopulmonary findings. Stable cardiac enlargement.    CT head IMPRESSION: Atrophy and chronic small vessel ischemia, stable from prior. No CT findings of acute ischemia or acute intracranial abnormality. ____________________________________________   PROCEDURES  Procedure(s) performed: None  Critical Care performed: Yes, see critical care note(s). Total critical care time spent 35 minutes.  ____________________________________________   INITIAL IMPRESSION / ASSESSMENT AND PLAN / ED COURSE  Pertinent labs & imaging results that were available during my care of the patient were reviewed by me and considered in my medical decision making (see chart for details).  Bayle Yanda is a 81 y.o. female with history of prior CVA with residual right upper extremity weakness, HTN, parkinson's dementia who presents for evaluation of gradual onset right-sided weakness which began at approximately 5:30 pm, has been constant since onset  but improving. Code stroke initiated on arrival that she has mild deficits at this time and her deficits seem to be improving significantly. NIH stroke on arrival is 3. CT head pending, labs pending, will consult teleneurologist on-call.  ----------------------------------------- 8:48 PM on 08/27/2015 ----------------------------------------- NIH stroke scale currently 1. Case discussed with Dr. Darin Engels, specialist on call who has evaluated the patient. Currently she has mild deficits and they seem to be improving rapidly therefore she recommends the patient not receive TPA. She recommends aspirin as well as admission to hospitalist for TIA workup. Aspirin ordered. Case discussed with hospitalist, Dr. Clint Guy, for admission. Labs reviewed and are notable for mild anemia, mild creatinine elevation. Troponin negative. CT head was negative for any acute intracranial process. Awaiting urinalysis at the time of admission. ____________________________________________   FINAL CLINICAL IMPRESSION(S) / ED DIAGNOSES  Final diagnoses:  Right sided weakness  Transient cerebral ischemia, unspecified transient cerebral ischemia type      Gayla Doss, MD 08/27/15 2148

## 2015-08-27 NOTE — ED Notes (Signed)
Pt arrives to ED via [facility] from [facility] for c/o right-sided weakness. Per EMS, staff at the facility reports the pt was noted to be leaning to the right around 530pm this evening at dinner and began c/o right-sided leg weakness. Per EMS, pt has a previous CVA with right-sided deficits, so unknown if these s/x's are new or progressive worsening of her existing condition. Pt denies any visual changes, denies pain or HA, is A&O, in NAD with respiration even, regular, and unlabored.

## 2015-08-27 NOTE — H&P (Signed)
[facility] Physicians - [place] at [facility]   PATIENT NAME: Stacey Baker    MR#:  [id number]  DATE OF BIRTH:  February 11, 1932    DATE OF ADMISSION:  08/27/2015  PRIMARY CARE PHYSICIAN: [name] BETH, PA-C   REQUESTING/REFERRING PHYSICIAN: Inocencio Homes  CHIEF COMPLAINT:   Chief Complaint  Patient presents with  . Code Stroke  right leg weakness  HISTORY OF PRESENT ILLNESS:  Stacey Baker  is a 81 y.o. female with a known history of stroke with residual right sided weakness and mild broca like aphasia who is now presenting with worsening right-sided weakness. For approximately one day in total duration she typically uses a wheelchair for ambulation but is able to self transfer now she is no longer able to stand secondary to weakness. She states the weakness has been improving somewhat right upper arm close to baseline right lower extremity still weak. She denies further symptomatology at this time  PAST MEDICAL HISTORY:   Past Medical History  Diagnosis Date  . Stroke (HCC)   . Dementia   . Parkinson disease (HCC)   . Hypertension   . Hyperlipidemia   . Depression     PAST SURGICAL HISTORY:  History reviewed. No pertinent past surgical history.  SOCIAL HISTORY:   Social History  Substance Use Topics  . Smoking status: Never Smoker   . Smokeless tobacco: Not on file  . Alcohol Use: No    FAMILY HISTORY:   Family History  Problem Relation Age of Onset  . Hypertension Other     DRUG ALLERGIES:   Allergies  Allergen Reactions  . Penicillins Other (See Comments)    Reaction:  Unknown   . Plavix [Clopidogrel Bisulfate] Other (See Comments)    Reaction:  Unknown     REVIEW OF SYSTEMS:  REVIEW OF SYSTEMS:  CONSTITUTIONAL: Denies fevers, chills, fatigue, weakness.  EYES: Denies blurred vision, double vision, or eye pain.  EARS, NOSE, THROAT: Denies tinnitus, ear pain, hearing loss.  RESPIRATORY: denies cough, shortness of breath, wheezing   CARDIOVASCULAR: Denies chest pain, palpitations, edema.  GASTROINTESTINAL: Denies nausea, vomiting, diarrhea, abdominal pain.  GENITOURINARY: Denies dysuria, hematuria.  ENDOCRINE: Denies nocturia or thyroid problems. HEMATOLOGIC AND LYMPHATIC: Denies easy bruising or bleeding.  SKIN: Denies rash or lesions.  MUSCULOSKELETAL: Denies pain in neck, back, shoulder, knees, hips, or further arthritic symptoms.  NEUROLOGIC: Denies paralysis, paresthesias. Positive right-sided weakness more so than baseline lower extremity greater than upper extremity  PSYCHIATRIC: Denies anxiety or depressive symptoms. Otherwise full review of systems performed by me is negative.   MEDICATIONS AT HOME:   Prior to Admission medications   Medication Sig Start Date End Date Taking? Authorizing Provider  acetaminophen (TYLENOL) 325 MG tablet Take 650 mg by mouth every 4 (four) hours as needed for mild pain.   Yes Historical Provider, MD  aspirin EC 81 MG tablet Take 81 mg by mouth daily.   Yes Historical Provider, MD  atorvastatin (LIPITOR) 10 MG tablet Take 10 mg by mouth at bedtime.   Yes Historical Provider, MD  carbidopa-levodopa (SINEMET IR) 25-100 MG tablet Take 1 tablet by mouth 4 (four) times daily.   Yes Historical Provider, MD  diclofenac sodium (VOLTAREN) 1 % GEL Apply 2 g topically 2 (two) times daily. Pt applies to left knee.   Yes Historical Provider, MD  donepezil (ARICEPT) 5 MG tablet Take 5 mg by mouth at bedtime.   Yes Historical Provider, MD  escitalopram (LEXAPRO) 10 MG tablet Take  10 mg by mouth daily.   Yes Historical Provider, MD  ferrous sulfate 325 (65 FE) MG tablet Take 325 mg by mouth 2 (two) times daily.   Yes Historical Provider, MD  fluticasone (FLONASE) 50 MCG/ACT nasal spray Place 1 spray into both nostrils daily as needed for rhinitis.   Yes Historical Provider, MD  guaiFENesin (MUCINEX) 600 MG 12 hr tablet Take 600 mg by mouth 2 (two) times daily as needed for to loosen phlegm.    Yes Historical Provider, MD  hydrOXYzine (ATARAX/VISTARIL) 25 MG tablet Take 25 mg by mouth every 6 (six) hours as needed.   Yes Historical Provider, MD  levETIRAcetam (KEPPRA) 500 MG tablet Take 500 mg by mouth 2 (two) times daily.   Yes Historical Provider, MD  lisinopril (PRINIVIL,ZESTRIL) 10 MG tablet Take 10 mg by mouth 2 (two) times daily.   Yes Historical Provider, MD  metoprolol tartrate (LOPRESSOR) 25 MG tablet Take 25 mg by mouth 2 (two) times daily.   Yes Historical Provider, MD  mirtazapine (REMERON) 15 MG tablet Take 15 mg by mouth at bedtime.   Yes Historical Provider, MD  QUEtiapine (SEROQUEL) 25 MG tablet Take 25 mg by mouth at bedtime.   Yes Historical Provider, MD  vitamin B-12 (CYANOCOBALAMIN) 1000 MCG tablet Take 1,000 mcg by mouth daily.   Yes Historical Provider, MD      VITAL SIGNS:  Blood pressure 130/52, pulse 56, temperature 97.8 F (36.6 C), temperature source Oral, resp. rate 16, height  (1.6 m), weight 135 lb (61.236 kg), SpO2 99 %.  PHYSICAL EXAMINATION:  VITAL SIGNS: Filed Vitals:   08/27/15 2154  BP: 130/52  Pulse: 56  Temp:   Resp: 16   GENERAL:81 y.o.female currently currently in no acute distress.  HEAD: Normocephalic, atraumatic.  EYES: Pupils equal, round, reactive to light. Extraocular muscles intact. No scleral icterus.  MOUTH: Moist mucosal membrane. Dentition intact. No abscess noted.  EAR, NOSE, THROAT: Clear without exudates. No external lesions.  NECK: Supple. No thyromegaly. No nodules. No JVD.  PULMONARY: Clear to ascultation, without wheeze rails or rhonci. No use of accessory muscles, Good respiratory effort. good air entry bilaterally CHEST: Nontender to palpation.  CARDIOVASCULAR: S1 and S2. Regular rate and rhythm. No murmurs, rubs, or gallops. No edema. Pedal pulses 2+ bilaterally.  GASTROINTESTINAL: Soft, nontender, nondistended. No masses. Positive bowel sounds. No hepatosplenomegaly.  MUSCULOSKELETAL: No swelling, clubbing, or edema.  Range of motion full in all extremities.  NEUROLOGIC: Cranial nerves II through XII are intact.Sensation intact. Reflexes intact. Pronator drift within normal limits, gait deferred at this time strength upper extremity bilateral 4/5 proximal/distal flexion/extension right lower extremity 3/5 proximal flexion and extension distal 4/5 flexion/extension.  SKIN: No ulceration, lesions, rashes, or cyanosis. Skin warm and dry. Turgor intact.  PSYCHIATRIC: Mood, affect within normal limits. The patient is awake, alert and oriented x 3. Insight, judgment intact.    LABORATORY PANEL:   CBC  Recent Labs Lab 08/27/15 1948  WBC 5.6  HGB 11.3*  HCT 33.3*  PLT 129*   ------------------------------------------------------------------------------------------------------------------  Chemistries   Recent Labs Lab 08/27/15 1948  NA 143  K 5.1  CL 112*  CO2 25  GLUCOSE 126*  BUN 45*  CREATININE 1.51*  CALCIUM 8.5*  AST 17  ALT <5*  ALKPHOS 83  BILITOT 0.1*   ------------------------------------------------------------------------------------------------------------------  Cardiac Enzymes  Recent Labs Lab 08/27/15 1948  TROPONINI <0.03   ------------------------------------------------------------------------------------------------------------------  RADIOLOGY:  Ct Head Wo Contrast  08/27/2015  CLINICAL DATA:  Code stroke.  Difficulty moving right leg. EXAM: CT HEAD WITHOUT CONTRAST TECHNIQUE: Contiguous axial images were obtained from the base of the skull through the vertex without intravenous contrast. COMPARISON:  Head CT 01/17/2015 FINDINGS: Stable atrophy and chronic small vessel ischemia.No intracranial hemorrhage, mass effect, or midline shift. No hydrocephalus. The basilar cisterns are patent. No evidence of territorial infarct. No intracranial fluid collection. Calvarium is intact. Included paranasal sinuses are clear. Minimal opacification of lower right mastoid air cells,  unchanged. IMPRESSION: Atrophy and chronic small vessel ischemia, stable from prior. No CT findings of acute ischemia or acute intracranial abnormality. These results were called by telephone at the time of interpretation on 08/27/2015 at 7:49 pm to Dr. Toney RakesERYKA GAYLE , who verbally acknowledged these results. Electronically Signed   By: Rubye OaksMelanie  Ehinger M.D.   On: 08/27/2015 19:49   Dg Chest Portable 1 View  08/27/2015  CLINICAL DATA:  Right-sided weakness tonight at 5:30. EXAM: PORTABLE CHEST 1 VIEW COMPARISON:  01/12/2015 FINDINGS: The heart is mildly enlarged but stable. The mediastinal and hilar contours are unchanged. Paratracheal density is unchanged and is due to thyroid goiter based on prior CT scans. The lungs are clear. The bony thorax is intact. IMPRESSION: No acute cardiopulmonary findings.  Stable cardiac enlargement. Electronically Signed   By: Rudie MeyerP.  Gallerani M.D.   On: 08/27/2015 20:11    EKG:   Orders placed or performed during the hospital encounter of 08/27/15  . ED EKG  . ED EKG    IMPRESSION AND PLAN:   81 year old Caucasian female history of stroke residual right-sided weakness as well as aphasia who is presenting with worsening weakness. However somewhat improving  1. TIA: Aspirin, statin therapy, telemetry, MRI, echo, lipids, hemoglobin A1c, carotid Doppler in searching for further risk factor modification, permissive hypertension 2. Hyperlipidemia unspecified: Statin therapy 3.: Essential hypertension, allow for permissive hypertension treating only greater than 220/120 or symptomatic-hydralazine at that time 4. Seizure disorder: Continue with Keppra 5. Venous thromboembolism prophylactic: SCDs   All the records are reviewed and case discussed with ED provider. Management plans discussed with the patient, family and they are in agreement.  CODE STATUS: Full  TOTAL TIME TAKING CARE OF THIS PATIENT: 35  minutes.    Aemon Koeller,  Mardi MainlandDavid K M.D on 08/27/2015 at 11:39  PM  Between 7am to 6pm - Pager - [phone]  After 6pm: House Pager: - [phone]  [facility]  Office  [phone]  CC: Primary care physician; Merlene PullingMCGRANAGHAN, MARY BETH, PA-C

## 2015-08-27 NOTE — Progress Notes (Signed)
   08/27/15 2002  Clinical Encounter Type  Visited With Patient  Visit Type Initial  Referral From Nurse  Consult/Referral To Chaplain  Advance Directives (For Healthcare)  Does patient have an advance directive? Yes  Type of Advance Directive Healthcare Power of Attorney  Does patient want to make changes to advanced directive? No - Patient declined  Copy of advanced directive(s) in chart? No - copy requested  Visited with Pt. No needs at this time. Konrad PentaChap Rick Eleuterio Dollar [phone]

## 2015-08-28 ENCOUNTER — Observation Stay: Payer: Medicare Other

## 2015-08-28 ENCOUNTER — Observation Stay
Admit: 2015-08-28 | Discharge: 2015-08-28 | Disposition: A | Discharge status: Home / Self Care | Payer: Medicare Other | Attending: Internal Medicine | Admitting: Internal Medicine

## 2015-08-28 DIAGNOSIS — R531 Weakness: Secondary | ICD-10-CM | POA: Diagnosis not present

## 2015-08-28 LAB — LIPID PANEL
CHOL/HDL RATIO: 2.1 ratio
CHOLESTEROL: 130 mg/dL (ref 0–200)
HDL: 61 mg/dL (ref >40)
LDL CALC: 62 mg/dL (ref 0–99)
TRIGLYCERIDES: 37 mg/dL (ref <150)
VLDL: 7 mg/dL (ref 0–40)

## 2015-08-28 LAB — URINALYSIS COMPLETE WITH MICROSCOPIC (ARMC ONLY)
Bilirubin Urine: NEGATIVE
GLUCOSE, UA: NEGATIVE mg/dL
Ketones, ur: NEGATIVE mg/dL
LEUKOCYTES UA: NEGATIVE
NITRITE: NEGATIVE
Protein, ur: NEGATIVE mg/dL
SPECIFIC GRAVITY, URINE: 1.016 (ref 1.005–1.030)
pH: 5 (ref 5.0–8.0)

## 2015-08-28 LAB — URINE DRUG SCREEN, QUALITATIVE (ARMC ONLY)
Amphetamines, Ur Screen: NOT DETECTED
Barbiturates, Ur Screen: NOT DETECTED
Benzodiazepine, Ur Scrn: NOT DETECTED
CANNABINOID 50 NG, UR ~~LOC~~: NOT DETECTED
COCAINE METABOLITE, UR ~~LOC~~: NOT DETECTED
MDMA (ECSTASY) UR SCREEN: NOT DETECTED
Methadone Scn, Ur: NOT DETECTED
Opiate, Ur Screen: NOT DETECTED
PHENCYCLIDINE (PCP) UR S: NOT DETECTED
Tricyclic, Ur Screen: NOT DETECTED

## 2015-08-28 LAB — HEMOGLOBIN A1C: Hgb A1c MFr Bld: 5.1 % (ref 4.0–6.0)

## 2015-08-28 LAB — MRSA PCR SCREENING: MRSA by PCR: NEGATIVE

## 2015-08-28 MED ORDER — ATORVASTATIN CALCIUM 10 MG PO TABS
10.0000 mg | ORAL_TABLET | Freq: Every day | ORAL | Status: DC
  Administered 2015-08-28 (×2): 10 mg via ORAL
  Filled 2015-08-28 (×2): qty 1

## 2015-08-28 MED ORDER — QUETIAPINE FUMARATE 25 MG PO TABS
25.0000 mg | ORAL_TABLET | Freq: Every day | ORAL | Status: DC
  Administered 2015-08-28 (×2): 25 mg via ORAL
  Filled 2015-08-28 (×2): qty 1

## 2015-08-28 MED ORDER — VITAMIN B-12 1000 MCG PO TABS
1000.0000 ug | ORAL_TABLET | Freq: Every day | ORAL | Status: DC
  Administered 2015-08-28 – 2015-08-29 (×2): 1000 ug via ORAL
  Filled 2015-08-28 (×2): qty 1

## 2015-08-28 MED ORDER — CARBIDOPA-LEVODOPA 25-100 MG PO TABS
1.0000 | ORAL_TABLET | Freq: Four times a day (QID) | ORAL | Status: DC
  Administered 2015-08-28 – 2015-08-29 (×6): 1 via ORAL
  Filled 2015-08-28 (×6): qty 1

## 2015-08-28 MED ORDER — LEVETIRACETAM 500 MG PO TABS
500.0000 mg | ORAL_TABLET | Freq: Two times a day (BID) | ORAL | Status: DC
  Administered 2015-08-28 – 2015-08-29 (×3): 500 mg via ORAL
  Filled 2015-08-28 (×3): qty 1

## 2015-08-28 MED ORDER — FERROUS SULFATE 325 (65 FE) MG PO TABS
325.0000 mg | ORAL_TABLET | Freq: Two times a day (BID) | ORAL | Status: DC
  Administered 2015-08-28 – 2015-08-29 (×3): 325 mg via ORAL
  Filled 2015-08-28 (×3): qty 1

## 2015-08-28 MED ORDER — FLUTICASONE PROPIONATE 50 MCG/ACT NA SUSP
1.0000 | Freq: Every day | NASAL | Status: DC | PRN
  Filled 2015-08-28: qty 16

## 2015-08-28 MED ORDER — DICLOFENAC SODIUM 1 % TD GEL
2.0000 g | Freq: Two times a day (BID) | TRANSDERMAL | Status: DC
  Administered 2015-08-28 – 2015-08-29 (×2): 2 g via TOPICAL
  Filled 2015-08-28: qty 100

## 2015-08-28 MED ORDER — ESCITALOPRAM OXALATE 10 MG PO TABS
10.0000 mg | ORAL_TABLET | Freq: Every day | ORAL | Status: DC
  Administered 2015-08-28 – 2015-08-29 (×2): 10 mg via ORAL
  Filled 2015-08-28 (×2): qty 1

## 2015-08-28 MED ORDER — DONEPEZIL HCL 5 MG PO TABS
5.0000 mg | ORAL_TABLET | Freq: Every day | ORAL | Status: DC
  Administered 2015-08-28 (×2): 5 mg via ORAL
  Filled 2015-08-28 (×2): qty 1

## 2015-08-28 MED ORDER — MIRTAZAPINE 15 MG PO TABS
15.0000 mg | ORAL_TABLET | Freq: Every day | ORAL | Status: DC
  Administered 2015-08-28 (×2): 15 mg via ORAL
  Filled 2015-08-28 (×2): qty 1

## 2015-08-28 NOTE — Plan of Care (Signed)
Problem: Activity: Goal: Risk for activity intolerance will decrease Outcome: Progressing Patient has no complaints of pain. VSS. Neuro checks performed. NIH=3. Worked with PT. Tolerating diet. [facility]

## 2015-08-28 NOTE — Progress Notes (Signed)
[facility] - [place] at [facility]   PATIENT NAME: Stacey ManchesterMiriam Baker    MR#:  [id number]  DATE OF BIRTH:  10/03/1932  SUBJECTIVE:  CHIEF COMPLAINT:   Chief Complaint  Patient presents with  . Code Stroke   Sleepy after getting seroquel.  Feels tired  REVIEW OF SYSTEMS:  Review of Systems  Constitutional: Negative for fever, weight loss, malaise/fatigue and diaphoresis.  HENT: Negative for ear discharge, ear pain, hearing loss, nosebleeds, sore throat and tinnitus.   Eyes: Negative for blurred vision and pain.  Respiratory: Negative for cough, hemoptysis, shortness of breath and wheezing.   Cardiovascular: Negative for chest pain, palpitations, orthopnea and leg swelling.  Gastrointestinal: Negative for heartburn, nausea, vomiting, abdominal pain, diarrhea, constipation and blood in stool.  Genitourinary: Negative for dysuria, urgency and frequency.  Musculoskeletal: Negative for myalgias and back pain.  Skin: Negative for itching and rash.  Neurological: Positive for focal weakness. Negative for dizziness, tingling, tremors, seizures, weakness and headaches.  Psychiatric/Behavioral: Negative for depression. The patient is not nervous/anxious.    DRUG ALLERGIES:   Allergies  Allergen Reactions  . Penicillins Other (See Comments)    Reaction:  Unknown   . Plavix [Clopidogrel Bisulfate] Other (See Comments)    Reaction:  Unknown    VITALS:  Blood pressure 144/43, pulse 45, temperature 97.4 F (36.3 C), temperature source Oral, resp. rate 20, height 5\' 3"  (1.6 m), weight 61.236 kg (135 lb), SpO2 98 %. PHYSICAL EXAMINATION:  Physical Exam  Constitutional: She is oriented to person, place, and time and well-developed, well-nourished, and in no distress.  HENT:  Head: Normocephalic and atraumatic.  Eyes: Conjunctivae and EOM are normal. Pupils are equal, round, and reactive to light.  Neck: Normal range of motion. Neck supple. No tracheal deviation present.  No thyromegaly present.  Cardiovascular: Normal rate, regular rhythm and normal heart sounds.   Pulmonary/Chest: Effort normal and breath sounds normal. No respiratory distress. She has no wheezes. She exhibits no tenderness.  Abdominal: Soft. Bowel sounds are normal. She exhibits no distension. There is no tenderness.  Musculoskeletal: Normal range of motion.  Neurological: She is alert and oriented to person, place, and time. She displays weakness (right LE more than the left). No cranial nerve deficit.  Skin: Skin is warm and dry. No rash noted.  Psychiatric: Mood and affect normal.   LABORATORY PANEL:   CBC  Recent Labs Lab 08/27/15 1948  WBC 5.6  HGB 11.3*  HCT 33.3*  PLT 129*   ------------------------------------------------------------------------------------------------------------------ Chemistries   Recent Labs Lab 08/27/15 1948  NA 143  K 5.1  CL 112*  CO2 25  GLUCOSE 126*  BUN 45*  CREATININE 1.51*  CALCIUM 8.5*  AST 17  ALT <5*  ALKPHOS 83  BILITOT 0.1*   RADIOLOGY:  Ct Head Wo Contrast  08/27/2015  CLINICAL DATA:  Code stroke.  Difficulty moving right leg. EXAM: CT HEAD WITHOUT CONTRAST TECHNIQUE: Contiguous axial images were obtained from the base of the skull through the vertex without intravenous contrast. COMPARISON:  Head CT 01/17/2015 FINDINGS: Stable atrophy and chronic small vessel ischemia.No intracranial hemorrhage, mass effect, or midline shift. No hydrocephalus. The basilar cisterns are patent. No evidence of territorial infarct. No intracranial fluid collection. Calvarium is intact. Included paranasal sinuses are clear. Minimal opacification of lower right mastoid air cells, unchanged. IMPRESSION: Atrophy and chronic small vessel ischemia, stable from prior. No CT findings of acute ischemia or acute intracranial abnormality. These results were called by telephone  at the time of interpretation on 08/27/2015 at 7:49 pm to Dr. Toney Rakes , who  verbally acknowledged these results. Electronically Signed   By: Rubye Oaks M.D.   On: 08/27/2015 19:49   Dg Chest Portable 1 View  08/27/2015  CLINICAL DATA:  Right-sided weakness tonight at 5:30. EXAM: PORTABLE CHEST 1 VIEW COMPARISON:  01/12/2015 FINDINGS: The heart is mildly enlarged but stable. The mediastinal and hilar contours are unchanged. Paratracheal density is unchanged and is due to thyroid goiter based on prior CT scans. The lungs are clear. The bony thorax is intact. IMPRESSION: No acute cardiopulmonary findings.  Stable cardiac enlargement. Electronically Signed   By: Rudie Meyer M.D.   On: 08/27/2015 20:11   ASSESSMENT AND PLAN:  81 year old Caucasian female history of stroke residual right-sided weakness as well as aphasia who is presenting with worsening weakness. somewhat improving  1.  Suspected TIA: continue Aspirin, statin therapy, telemetry, await MRI, echo, lipids, hemoglobin A1c, carotid Doppler in searching for further risk factor modification, permissive hypertension in acute settings   2. Hyperlipidemia unspecified: Statin therapy  3: Essential hypertension, allow for permissive hypertension treating only greater than 220/120 or symptomatic-hydralazine at that time  4. Seizure disorder: Continue with Keppra  5. Venous thromboembolism prophylactic: SCDs     All the records are reviewed and case discussed with Care Management/Social Worker. Management plans discussed with the patient, family and they are in agreement.  CODE STATUS: DO NOT RESUSCITATE   TOTAL TIME TAKING CARE OF THIS PATIENT: 35 minutes.   More than 50% of the time was spent in counseling/coordination of care: YES (case discussed with the patient's son Leonette Most at [phone]), he was agreeable with changing her CODE STATUS from full code to DO NOT RESUSCITATE   POSSIBLE D/C IN AM, DEPENDING ON CLINICAL CONDITION and stroke workup    [facility], Brylin Stanislawski M.D on 08/28/2015 at 8:23 AM  Between  7am to 6pm - Pager - [phone]  After 6pm go to [email or web address] - password [email or web address] [facility]  [facility]  Office  [phone]  CC: Primary care physician; [name] BETH, PA-C

## 2015-08-28 NOTE — Progress Notes (Signed)
Patient stated she was "frustrated" it seemed as if every time she started feeling better, able to do things, something else happens to stop her." She then communicated pervious fall which had her placed into assisted living.

## 2015-08-28 NOTE — Progress Notes (Signed)
Reported to Dr Sherryll BurgerShah that patients heart rate continues to be 39 to high 40's. MD acknowledged, no orders given.

## 2015-08-28 NOTE — Progress Notes (Signed)
*  PRELIMINARY RESULTS* Echocardiogram 2D Echocardiogram has been performed.  Georgann HousekeeperJerry R Hege 08/28/2015, 9:23 AM

## 2015-08-28 NOTE — NC FL2 (Signed)
[place] MEDICAID FL2 LEVEL OF CARE SCREENING TOOL     IDENTIFICATION  Patient Name: Stacey Baker Birthdate: 1931-12-20 Sex: female Admission Date (Current Location): 08/27/2015  [place] and [place] Number: [facility] and Address:  [facility], [address], [place], [place] [address]      Provider Number: [id number]  Attending Physician Name and Address:  Delfino Lovett, MD  Relative Name and Phone Number:       Current Level of Care: ALF Recommended Level of Care: Assisted Living Facility Prior Approval Number:    Date Approved/Denied:   PASRR Number:    Discharge Plan: Domiciliary (Rest home)    Current Diagnoses: Patient Active Problem List   Diagnosis Date Noted  . TIA (transient ischemic attack) 08/27/2015   Past Medical History  Diagnosis Date  . Stroke (HCC)   . Dementia   . Parkinson disease (HCC)   . Hypertension   . Hyperlipidemia   . Depression          Orientation ACTIVITIES/SOCIAL BLADDER RESPIRATION    Self, Time, Situation, Place  Family supportive, Active Continent Normal  BEHAVIORAL SYMPTOMS/MOOD NEUROLOGICAL BOWEL NUTRITION STATUS      Continent  (no salt added)  PHYSICIAN VISITS COMMUNICATION OF NEEDS Height & Weight Skin  30 days Verbally   135 lbs. Normal          AMBULATORY STATUS RESPIRATION    Supervision limited Normal      Personal Care Assistance Level of Assistance  Bathing, Dressing Bathing Assistance: Limited assistance   Dressing Assistance: Limited assistance      Functional Limitations Info                SPECIAL CARE FACTORS FREQUENCY  PT (By licensed PT), OT (By licensed OT)     PT Frequency: 3x's week OT Frequency: 2x's week        Home Health PT and OT to evaluate and treat at ALF   Additional Factors Info  Code Status, Allergies Code Status Info: DNR Allergies Info: Pencillin, plavix           Current Medications  (08/28/2015): Current Facility-Administered Medications  Medication Dose Route Frequency Provider Last Rate Last Dose  . acetaminophen (TYLENOL) tablet 650 mg  650 mg Oral Q6H PRN Wyatt Haste, MD       Or  . acetaminophen (TYLENOL) suppository 650 mg  650 mg Rectal Q6H PRN Wyatt Haste, MD      . aspirin suppository 300 mg  300 mg Rectal Daily Wyatt Haste, MD       Or  . aspirin tablet 325 mg  325 mg Oral Daily Wyatt Haste, MD      . atorvastatin (LIPITOR) tablet 10 mg  10 mg Oral QHS Wyatt Haste, MD   10 mg at 08/28/15 0225  . carbidopa-levodopa (SINEMET IR) 25-100 MG per tablet immediate release 1 tablet  1 tablet Oral QID Wyatt Haste, MD      . diclofenac sodium (VOLTAREN) 1 % transdermal gel 2 g  2 g Topical BID Wyatt Haste, MD   2 g at 08/28/15 0200  . donepezil (ARICEPT) tablet 5 mg  5 mg Oral QHS Wyatt Haste, MD   5 mg at 08/28/15 0225  . escitalopram (LEXAPRO) tablet 10 mg  10 mg Oral Daily Wyatt Haste, MD      . ferrous sulfate tablet 325 mg  325 mg Oral  BID WC Wyatt Haste, MD      . fluticasone [facility]) 50 MCG/ACT nasal spray 1 spray  1 spray Each Nare Daily PRN Wyatt Haste, MD      . hydrALAZINE (APRESOLINE) injection 10 mg  10 mg Intravenous Q4H PRN Wyatt Haste, MD      . levETIRAcetam (KEPPRA) tablet 500 mg  500 mg Oral BID Wyatt Haste, MD      . mirtazapine (REMERON) tablet 15 mg  15 mg Oral QHS Wyatt Haste, MD   15 mg at 08/28/15 0225  . ondansetron (ZOFRAN) tablet 4 mg  4 mg Oral Q6H PRN Wyatt Haste, MD       Or  . ondansetron [facility]) injection 4 mg  4 mg Intravenous Q6H PRN Wyatt Haste, MD      . QUEtiapine (SEROQUEL) tablet 25 mg  25 mg Oral QHS Wyatt Haste, MD   25 mg at 08/28/15 0225  . sodium chloride 0.9 % injection 3 mL  3 mL Intravenous Q12H Wyatt Haste, MD   3 mL at 08/28/15 0155  . vitamin B-12 (CYANOCOBALAMIN) tablet 1,000 mcg  1,000 mcg Oral Daily Wyatt Haste, MD       Do not use this list as official medication orders.  Please verify with discharge summary.  Discharge Medications:   Medication List    ASK your doctor about these medications        acetaminophen 325 MG tablet  Commonly known as:  TYLENOL  Take 650 mg by mouth every 4 (four) hours as needed for mild pain.     aspirin EC 81 MG tablet  Take 81 mg by mouth daily.     atorvastatin 10 MG tablet  Commonly known as:  LIPITOR  Take 10 mg by mouth at bedtime.     carbidopa-levodopa 25-100 MG tablet  Commonly known as:  SINEMET IR  Take 1 tablet by mouth 4 (four) times daily.     diclofenac sodium 1 % Gel  Commonly known as:  VOLTAREN  Apply 2 g topically 2 (two) times daily. Pt applies to left knee.     donepezil 5 MG tablet  Commonly known as:  ARICEPT  Take 5 mg by mouth at bedtime.     escitalopram 10 MG tablet  Commonly known as:  LEXAPRO  Take 10 mg by mouth daily.     ferrous sulfate 325 (65 FE) MG tablet  Take 325 mg by mouth 2 (two) times daily.     fluticasone 50 MCG/ACT nasal spray  Commonly known as:  FLONASE  Place 1 spray into both nostrils daily as needed for rhinitis.     guaiFENesin 600 MG 12 hr tablet  Commonly known as:  MUCINEX  Take 600 mg by mouth 2 (two) times daily as needed for to loosen phlegm.     hydrOXYzine 25 MG tablet  Commonly known as:  ATARAX/VISTARIL  Take 25 mg by mouth every 6 (six) hours as needed.     levETIRAcetam 500 MG tablet  Commonly known as:  KEPPRA  Take 500 mg by mouth 2 (two) times daily.     lisinopril 10 MG tablet  Commonly known as:  PRINIVIL,ZESTRIL  Take 10 mg by mouth 2 (two) times daily.     metoprolol tartrate 25 MG tablet  Commonly known as:  LOPRESSOR  Take 25 mg by mouth 2 (two) times daily.     mirtazapine 15 MG tablet  Commonly known as:  REMERON  Take 15 mg by mouth at bedtime.     QUEtiapine 25 MG tablet  Commonly known as:  SEROQUEL  Take 25 mg by mouth at bedtime.     vitamin B-12 1000 MCG tablet  Commonly known as:  CYANOCOBALAMIN  Take  1,000 mcg by mouth daily.        Relevant Imaging Results:  Relevant Lab Results:  Recent Labs    Additional Information    Ned Cardara N Veto Macqueen, LCSW

## 2015-08-28 NOTE — Care Management Obs Status (Signed)
MEDICARE OBSERVATION STATUS NOTIFICATION   Patient Details  Name: Glorianne ManchesterMiriam Adolf MRN: [id number] Date of Birth: 12/20/1931   Medicare Observation Status Notification Given:  Yes    Gwenette GreetBrenda S Brailyn Delman, RN 08/28/2015, 1:33 PM

## 2015-08-28 NOTE — Plan of Care (Signed)
Problem: Education: Goal: Knowledge of disease or condition will improve Outcome: Not Progressing Pt at times had minimal desire to learn information on disease process.   Problem: Health Behavior/Discharge Planning: Goal: Ability to manage health-related needs will improve Outcome: Not Applicable Date Met:  39/58/44 Pt from [place]. When asked about her medications Pt said " they just give me my medicine and I take it".   Problem: Self-Care: Goal: Ability to participate in self-care as condition permits will improve Outcome: Progressing Pt has order for OT/PT/SLP due to her admitting diagnosis  Goal: Ability to communicate needs accurately will improve Outcome: Progressing Pt able to verbalize her needs.   Problem: Nutrition: Goal: Risk of aspiration will decrease Outcome: Not Applicable Date Met:  17/12/78 Pt passed stroke swallow screen. MD notified and pt placed on heart healthy diet. Pt took pills whole with water without any s/sx of aspiration. SLP evaluation ordered due to admitting dx.   Problem: Tissue Perfusion: Goal: Complications of Ischemic Stroke will be minimized. Outcome: Progressing Pt with some baseline residual effects from previous stroke. Other studies pending.   Problem: Nutrition: Goal: Adequate nutrition will be maintained Outcome: Not Applicable Date Met:  71/83/67 Did not occur overnight

## 2015-08-28 NOTE — Progress Notes (Signed)
Pt heart rate dipping into the low 40's. Pt in with stroke like symptoms. Notified Dr Sheryle Hailiamond via telephone. No new orders given. Will continue to monitor.Geri Seminolerusilla A Jackson, RN

## 2015-08-28 NOTE — Evaluation (Signed)
Physical Therapy Evaluation Patient Details Name: Stacey ManchesterMiriam Sarkisyan MRN: [id number] DOB: 12/05/1931 Today's Date: 08/28/2015   History of Present Illness  Patient is a pleasant 81 y/o female that presents with stroke like symptoms and R sided weakness. Patient has a history of R sided weakness and Broca's aphasia from previous CVA. Initial CT scan negative for acute abnormality, MRI pending. She has a history of dementia and Parkinson's as well. [facility].   Clinical Impression  Patient presents initially with lethargy, however she increases alertness with sitting and provides excellent participation in PT session. She typically transfers to and from a wheelchair at baseline, however given the set up available today she was unable to provide sufficient assistance from her UEs to [facility] or [facility] independently. She requires assistance from PT and RN student to transfer to bedside commode and chair secondary to LE weakness. She would benefit from additional PT services to increase her independence with transfers and mobility.     Follow Up Recommendations SNF    Equipment Recommendations       Recommendations for Other Services       Precautions / Restrictions Precautions Precautions: Fall Restrictions Weight Bearing Restrictions: No      Mobility  Bed Mobility Overal bed mobility: Needs Assistance Bed Mobility: Supine to Sit     Supine to sit: [facility];Min assist     General bed mobility comments: Patient provides good effort throughout transfer, though she requires assistance to come to the edge of the bed.  Transfers Overall transfer level: Needs assistance   Transfers: Sit to/from Stand Sit to Stand: +2 physical assistance;Min assist;Mod assist         General transfer comment: Patient transfers with instruction to lean forward to initiate [facility]. +2 assistance for safety with stand pivot  transfer.   Ambulation/Gait             General Gait Details: Stand pivot transfer utilized with mod-max A x2 as patient is unable to safely bring herself into single leg stance whatsoever to initiate gait or turning.   Stairs            Wheelchair Mobility    Modified Rankin (Stroke Patients Only)       Balance Overall balance assessment: Needs assistance Sitting-balance support: Bilateral upper extremity supported Sitting balance-Leahy Scale: Fair Sitting balance - Comments: Patient fatigues with prolonged sitting and begins to lean to her R and posteriorly, with cuing for hand placement she is able to sit independently for periods of time prior to leaning posteriorly again.  Postural control: Right lateral lean;Posterior lean   Standing balance-Leahy Scale: Zero Standing balance comment: Requires mod-max A x 2 for assistance with standing balance secondary to LE weakness.                              Pertinent Vitals/Pain Pain Assessment:  (Patient does not complain of any pain during this session)    Home Living Family/patient expects to be discharged to:: Skilled nursing facility                 Additional Comments: Patient presents from Assisted living.     Prior Function Level of Independence: Independent with assistive device(s)         Comments: Patient states she has been able to transfer to and from a w/c currently without assistance.  Hand Dominance        Extremity/Trunk Assessment   Upper Extremity Assessment: Generalized weakness (No focal deficits noted from R to L though not extensively investigated  as patient asked to use [facility])           Lower Extremity Assessment: Generalized weakness         Communication   Communication: HOH (Broca's aphasia in chart, no deficits noted during this session)  Cognition Arousal/Alertness: Awake/alert Behavior During Therapy: WFL for tasks assessed/performed Overall  Cognitive Status: History of cognitive impairments - at baseline (No cognitive deficits noted during this session, noted from chart)                      General Comments      Exercises        Assessment/Plan    PT Assessment Patient needs continued PT services  PT Diagnosis Difficulty walking;Generalized weakness   PT Problem List Decreased strength;Decreased mobility;Decreased activity tolerance;Decreased balance  PT Treatment Interventions Therapeutic activities;Therapeutic exercise;Balance training;Functional mobility training;Wheelchair mobility training   PT Goals (Current goals can be found in the Care Plan section) Acute Rehab PT Goals Patient Stated Goal: To return to her prior level of function.  PT Goal Formulation: With patient Time For Goal Achievement: 09/11/15 Potential to Achieve Goals: Good    Frequency 7X/week (Pending MRI )   Barriers to discharge   Patient requires assistance to complete bed to chair transfers.     Co-evaluation               End of Session Equipment Utilized During Treatment: Gait belt Activity Tolerance: Patient tolerated treatment well Patient left: in chair;with chair alarm set;with call bell/phone within reach;with nursing/sitter in room Nurse Communication: Mobility status    Functional Assessment Tool Used: Clinical judgement  Functional Limitation: Mobility: Walking and moving around Mobility: Walking and Moving Around Current Status (G9562): At least 60 percent but less than 80 percent impaired, limited or restricted Mobility: Walking and Moving Around Goal Status [phone]): At least 60 percent but less than 80 percent impaired, limited or restricted    Time: 1052-1118 PT Time Calculation (min) (ACUTE ONLY): 26 min   Charges:   PT Evaluation $Initial PT Evaluation Tier I: 1 Procedure PT Treatments $Therapeutic Activity: 8-22 mins (Toileting transfer.)   PT G Codes:   PT G-Codes **NOT FOR INPATIENT  CLASS** Functional Assessment Tool Used: Clinical judgement  Functional Limitation: Mobility: Walking and moving around Mobility: Walking and Moving Around Current Status (V7846): At least 60 percent but less than 80 percent impaired, limited or restricted Mobility: Walking and Moving Around Goal Status [phone]): At least 60 percent but less than 80 percent impaired, limited or restricted   Kerin Ransom, PT, DPT     08/28/2015, 3:49 PM

## 2015-08-28 NOTE — Evaluation (Signed)
Clinical/Bedside Swallow Evaluation Patient Details  Name: Stacey Baker MRN: [id number] Date of Birth: 01/16/1932  Today's Date: 08/28/2015 Time: SLP Start Time (ACUTE ONLY): 1145 SLP Stop Time (ACUTE ONLY): 1215 SLP Time Calculation (min) (ACUTE ONLY): 30 min  Past Medical History:  Past Medical History  Diagnosis Date  . Stroke (HCC)   . Dementia   . Parkinson disease (HCC)   . Hypertension   . Hyperlipidemia   . Depression    Past Surgical History: History reviewed. No pertinent past surgical history. HPI:  Pt states no issues with swallowing and that she is at her baseline ability. HPI: Stacey Baker is a 81 y.o. female with a known history of stroke with residual right sided weakness and mild broca like aphasia who is now presenting with worsening right-sided weakness. For approximately one day in total duration she typically uses a wheelchair for ambulation but is able to self transfer now she is no longer able to stand secondary to weakness. She states the weakness has been improving somewhat right upper arm close to baseline right lower extremity still weak. She denies further symptomatology at this time    Assessment / Plan / Recommendation Clinical Impression  Pt presented with no immediate or overt s/s of aspriation or oral pharyngeal phase dysphagia. Pt consumed 5 ice chips, ~1 ounce of water, 7 trials of puree, and 10+ trials of solids with no immediate or overt s/s of aspiration across all trials. When asked directly if she has concerns with speech, language or swallowing, Pt said the only thing she is a little concerned about is mild word finding difficulty. ST educated Pt about outpatient ST services (she indicated her SNF has an ST available for f/u). ST services available per request why Pt is admitted.  ST recommends any further ST needs in an outpatient setting if Pt feels this is necessary. At this time, Pt is communicating wants and needs with others effectively. NSG  updated.    Aspiration Risk   (reduced)    Diet Recommendation Thin;Age appropriate regular solids   Medication Administration: Whole meds with puree Compensations: Minimize environmental distractions;Slow rate;Small sips/bites    Other  Recommendations Oral Care Recommendations: Oral care BID;Staff/trained caregiver to provide oral care   Follow Up Recommendations       Frequency and Duration        Pertinent Vitals/Pain None indicated     SLP Swallow Goals  N/A   Swallow Study Prior Functional Status   Regular diet with thins    General Date of Onset: 08/27/15 Other Pertinent Information: Pt states no issues with swallowing and that she is at her baseline ability. HPI: Stacey Baker is a 81 y.o. female with a known history of stroke with residual right sided weakness and mild broca like aphasia who is now presenting with worsening right-sided weakness. For approximately one day in total duration she typically uses a wheelchair for ambulation but is able to self transfer now she is no longer able to stand secondary to weakness. She states the weakness has been improving somewhat right upper arm close to baseline right lower extremity still weak. She denies further symptomatology at this time  Type of Study: Bedside swallow evaluation Temperature Spikes Noted: No Respiratory Status: Room air History of Recent Intubation: No Behavior/Cognition: Alert;Cooperative;Pleasant mood Oral Cavity - Dentition: Adequate natural dentition/normal for age Self-Feeding Abilities: Able to feed self;Needs set up Patient Positioning: Upright in chair/Tumbleform Baseline Vocal Quality: Low vocal intensity Volitional Swallow: Able to  elicit    Oral/Motor/Sensory Function Overall Oral Motor/Sensory Function: Appears within functional limits for tasks assessed (as observed with bolus management)   Ice Chips Ice chips: Within functional limits Presentation: Spoon Other Comments: Pt consumed 5  trials of ice chips with no overt or immediate s/s of aspiration.   Thin Liquid Thin Liquid: Within functional limits Presentation: Cup;Self Fed Other Comments: Pt consumed ~1 ounce thin liquid (water) with no immediate or overt s/s of aspiration during ST visit.    Nectar Thick Nectar Thick Liquid: Not tested   Honey Thick Honey Thick Liquid: Not tested   Puree Puree: Within functional limits Presentation: Self Fed;Spoon Other Comments: Pt consumed frozen consistency ice cream and apple sauce with no immediate or overt s/s of aspiration across trials.   Solid   GO    Solid: Within functional limits Presentation: Self Fed Other Comments: Pt consumed 6 trials of graham crackers as well as chicken salad sandwhich trials from lunch tray (4 observed directly) with no immediate or overt s/s of aspiration across trials.       Stacey Baker 08/28/2015,12:15 PM

## 2015-08-29 DIAGNOSIS — R531 Weakness: Secondary | ICD-10-CM | POA: Diagnosis not present

## 2015-08-29 NOTE — Discharge Summary (Signed)
[facility] - Eugenio Saenz at [facility]   PATIENT NAME: Stacey Baker    MR#:  [id number]  DATE OF BIRTH:  09-09-1932  DATE OF ADMISSION:  08/27/2015 ADMITTING PHYSICIAN: Wyatt Haste, MD  DATE OF DISCHARGE: 08/29/2015   PRIMARY CARE PHYSICIAN: [name] BETH, PA-C    ADMISSION DIAGNOSIS:  CVA (cerebral infarction) [I63.9] Right sided weakness [M62.89] Transient cerebral ischemia, unspecified transient cerebral ischemia type [G45.9]  DISCHARGE DIAGNOSIS:  TIA ruled out Asymptomatic bradycardia Generalized weakness - multi factorial SECONDARY DIAGNOSIS:   Past Medical History  Diagnosis Date  . Stroke (HCC)   . Dementia   . Parkinson disease (HCC)   . Hypertension   . Hyperlipidemia   . Depression    HOSPITAL COURSE:  81 y.o. female with a known history of stroke with residual right sided weakness and mild broca like aphasia admitted with worsening right-sided weakness. Her weakness was improving in somewhat right upper arm and was close to baseline. Her right lower extremity was still weak on admission for which she was admitted for further evaluation and management.  See Dr. Jarrett Soho dated history and physical for further details.  She underwent full neurological evaluation which was all essentially negative.  Her MRI of the brain did not show any stroke.  Carotid Dopplers were negative.  Echo was within normal limit.  Patient was evaluated by physical therapy who recommended short-term rehabilitation, but talking with patient's son, patient was already at assisted living who has been providing her physical therapy.  3.  Has a week and thereafter continue same services as is for now.  They are in agreement with the discharge plan and she is being discharged back to her facility in stable condition.  DISCHARGE CONDITIONS:  stable CONSULTS OBTAINED:  Treatment Team:  Wyatt Haste, MD DRUG ALLERGIES:   Allergies  Allergen Reactions  . Penicillins  Other (See Comments)    Reaction:  Unknown   . Plavix [Clopidogrel Bisulfate] Other (See Comments)    Reaction:  Unknown    DISCHARGE MEDICATIONS:   Current Discharge Medication List    CONTINUE these medications which have NOT CHANGED   Details  acetaminophen (TYLENOL) 325 MG tablet Take 650 mg by mouth every 4 (four) hours as needed for mild pain.    aspirin EC 81 MG tablet Take 81 mg by mouth daily.    atorvastatin (LIPITOR) 10 MG tablet Take 10 mg by mouth at bedtime.    carbidopa-levodopa (SINEMET IR) 25-100 MG tablet Take 1 tablet by mouth 4 (four) times daily.    diclofenac sodium (VOLTAREN) 1 % GEL Apply 2 g topically 2 (two) times daily. Pt applies to left knee.    donepezil (ARICEPT) 5 MG tablet Take 5 mg by mouth at bedtime.    escitalopram (LEXAPRO) 10 MG tablet Take 10 mg by mouth daily.    ferrous sulfate 325 (65 FE) MG tablet Take 325 mg by mouth 2 (two) times daily.    fluticasone (FLONASE) 50 MCG/ACT nasal spray Place 1 spray into both nostrils daily as needed for rhinitis.    guaiFENesin (MUCINEX) 600 MG 12 hr tablet Take 600 mg by mouth 2 (two) times daily as needed for to loosen phlegm.    hydrOXYzine (ATARAX/VISTARIL) 25 MG tablet Take 25 mg by mouth every 6 (six) hours as needed.    levETIRAcetam (KEPPRA) 500 MG tablet Take 500 mg by mouth 2 (two) times daily.    lisinopril (PRINIVIL,ZESTRIL) 10 MG tablet Take  10 mg by mouth 2 (two) times daily.    metoprolol tartrate (LOPRESSOR) 25 MG tablet Take 25 mg by mouth 2 (two) times daily.    mirtazapine (REMERON) 15 MG tablet Take 15 mg by mouth at bedtime.    QUEtiapine (SEROQUEL) 25 MG tablet Take 25 mg by mouth at bedtime.    vitamin B-12 (CYANOCOBALAMIN) 1000 MCG tablet Take 1,000 mcg by mouth daily.       DISCHARGE INSTRUCTIONS:   DIET:  Cardiac diet  DISCHARGE CONDITION:  Stable  ACTIVITY:  Activity as tolerated  OXYGEN:  Home Oxygen: No.   Oxygen Delivery: room air  DISCHARGE  LOCATION:  nursing home   If you experience worsening of your admission symptoms, develop shortness of breath, life threatening emergency, suicidal or homicidal thoughts you must seek medical attention immediately by calling 911 or calling your MD immediately  if symptoms less severe.  You Must read complete instructions/literature along with all the possible adverse reactions/side effects for all the Medicines you take and that have been prescribed to you. Take any new Medicines after you have completely understood and accpet all the possible adverse reactions/side effects.   Please note  You were cared for by a hospitalist during your hospital stay. If you have any questions about your discharge medications or the care you received while you were in the hospital after you are discharged, you can call the unit and asked to speak with the hospitalist on call if the hospitalist that took care of you is not available. Once you are discharged, your primary care physician will handle any further medical issues. Please note that NO REFILLS for any discharge medications will be authorized once you are discharged, as it is imperative that you return to your primary care physician (or establish a relationship with a primary care physician if you do not have one) for your aftercare needs so that they can reassess your need for medications and monitor your lab values.    On the day of Discharge:  VITAL SIGNS:  Blood pressure 171/70, pulse 69, temperature 98.3 F (36.8 C), temperature source Oral, resp. rate 18, height 5\' 3"  (1.6 m), weight 61.236 kg (135 lb), SpO2 100 %. PHYSICAL EXAMINATION:  GENERAL:  81 y.o.-year-old patient lying in the bed with no acute distress.  EYES: Pupils equal, round, reactive to light and accommodation. No scleral icterus. Extraocular muscles intact.  HEENT: Head atraumatic, normocephalic. Oropharynx and nasopharynx clear.  NECK:  Supple, no jugular venous distention. No  thyroid enlargement, no tenderness.  LUNGS: Normal breath sounds bilaterally, no wheezing, rales,rhonchi or crepitation. No use of accessory muscles of respiration.  CARDIOVASCULAR: S1, S2 normal. No murmurs, rubs, or gallops.  ABDOMEN: Soft, non-tender, non-distended. Bowel sounds present. No organomegaly or mass.  EXTREMITIES: No pedal edema, cyanosis, or clubbing.  NEUROLOGIC: Cranial nerves II through XII are intact. Muscle strength 4/5 in all extremities. Sensation intact. Gait not checked.  PSYCHIATRIC: The patient is alert and oriented x 3.  SKIN: No obvious rash, lesion, or ulcer.  DATA REVIEW:   CBC  Recent Labs Lab 08/27/15 1948  WBC 5.6  HGB 11.3*  HCT 33.3*  PLT 129*    Chemistries   Recent Labs Lab 08/27/15 1948  NA 143  K 5.1  CL 112*  CO2 25  GLUCOSE 126*  BUN 45*  CREATININE 1.51*  CALCIUM 8.5*  AST 17  ALT <5*  ALKPHOS 83  BILITOT 0.1*    Cardiac Enzymes  Recent Labs  Lab 08/27/15 1948  TROPONINI <0.03    Microbiology Results  Results for orders placed or performed during the hospital encounter of 08/27/15  MRSA PCR Screening     Status: None   Collection Time: 08/28/15  2:28 AM  Result Value Ref Range Status   MRSA by PCR NEGATIVE NEGATIVE Final    Comment:        The GeneXpert MRSA Assay (FDA approved for NASAL specimens only), is one component of a comprehensive MRSA colonization surveillance program. It is not intended to diagnose MRSA infection nor to guide or monitor treatment for MRSA infections.     RADIOLOGY:  Ct Head Wo Contrast  08/27/2015  CLINICAL DATA:  Code stroke.  Difficulty moving right leg. EXAM: CT HEAD WITHOUT CONTRAST TECHNIQUE: Contiguous axial images were obtained from the base of the skull through the vertex without intravenous contrast. COMPARISON:  Head CT 01/17/2015 FINDINGS: Stable atrophy and chronic small vessel ischemia.No intracranial hemorrhage, mass effect, or midline shift. No hydrocephalus. The  basilar cisterns are patent. No evidence of territorial infarct. No intracranial fluid collection. Calvarium is intact. Included paranasal sinuses are clear. Minimal opacification of lower right mastoid air cells, unchanged. IMPRESSION: Atrophy and chronic small vessel ischemia, stable from prior. No CT findings of acute ischemia or acute intracranial abnormality. These results were called by telephone at the time of interpretation on 08/27/2015 at 7:49 pm to Dr. Toney Rakes , who verbally acknowledged these results. Electronically Signed   By: Rubye Oaks M.D.   On: 08/27/2015 19:49   Mr Brain Wo Contrast  08/28/2015  CLINICAL DATA:  Stroke.  Worsening right-sided weakness. EXAM: MRI HEAD WITHOUT CONTRAST TECHNIQUE: Multiplanar, multiecho pulse sequences of the brain and surrounding structures were obtained without intravenous contrast. COMPARISON:  MRI 05/04/2014 FINDINGS: Generalized atrophy of a moderate degree. This is stable. Ventricular enlargement is consistent with atrophy. Negative for acute infarct. Chronic microvascular ischemic change in the white matter is mild for age. Negative for intracranial hemorrhage. Negative for mass or edema. No shift of the midline structures. Paranasal sinuses clear.  No orbital mass. IMPRESSION: Moderate atrophy with mild chronic microvascular ischemia No acute abnormality. Electronically Signed   By: Marlan Palau M.D.   On: 08/28/2015 14:18   US Carotid Bilateral  08/28/2015  CLINICAL DATA:  TIA. EXAM: BILATERAL CAROTID DUPLEX ULTRASOUND TECHNIQUE: Wallace Cullens scale imaging, color Doppler and duplex ultrasound were performed of bilateral carotid and vertebral arteries in the neck. COMPARISON:  None. FINDINGS: Criteria: Quantification of carotid stenosis is based on velocity parameters that correlate the residual internal carotid diameter with NASCET-based stenosis levels, using the diameter of the distal internal carotid lumen as the denominator for stenosis  measurement. The following velocity measurements were obtained: RIGHT ICA:  63/13 cm/sec CCA:  66/16 cm/sec SYSTOLIC ICA/CCA RATIO:  1.0 DIASTOLIC ICA/CCA RATIO:  0.8 ECA:  55 cm/sec LEFT ICA:  59/11 cm/sec CCA:  57/10 cm/sec SYSTOLIC ICA/CCA RATIO:  1.0 DIASTOLIC ICA/CCA RATIO:  1.1 ECA:  65 cm/sec RIGHT CAROTID ARTERY: Mild right carotid bifurcation plaque. No flow limiting stenosis. Waveforms unremarkable. RIGHT VERTEBRAL ARTERY:  Patent with antegrade flow. LEFT CAROTID ARTERY: Mild left carotid bifurcation plaque. No flow limiting stenosis. Waveforms unremarkable . LEFT VERTEBRAL ARTERY:  Patent with antegrade flow. IMPRESSION: 1. Mild bilateral carotid bifurcation plaque. No flow limiting stenosis. 2. Vertebral arteries are patent with antegrade flow. Electronically Signed   By: Maisie Fus  Register   On: 08/28/2015 10:18   Dg Chest Portable 1 View  08/27/2015  CLINICAL DATA:  Right-sided weakness tonight at 5:30. EXAM: PORTABLE CHEST 1 VIEW COMPARISON:  01/12/2015 FINDINGS: The heart is mildly enlarged but stable. The mediastinal and hilar contours are unchanged. Paratracheal density is unchanged and is due to thyroid goiter based on prior CT scans. The lungs are clear. The bony thorax is intact. IMPRESSION: No acute cardiopulmonary findings.  Stable cardiac enlargement. Electronically Signed   By: Rudie Meyer M.D.   On: 08/27/2015 20:11     Management plans discussed with the patient, family and they are in agreement.  CODE STATUS: DO NOT RESUSCITATE  TOTAL TIME TAKING CARE OF THIS PATIENT: 55 minutes.    [facility], Rheannon Cerney M.D on 08/29/2015 at 11:57 AM  Between 7am to 6pm - Pager - [phone]  After 6pm go to [email or web address] - password [email or web address] [facility]  [facility]  Office  [phone]  CC: Primary care physician; [name] BETH, PA-C

## 2015-08-29 NOTE — Plan of Care (Signed)
Problem: Physical Regulation: Goal: Ability to maintain clinical measurements within normal limits will improve Outcome: Progressing Pt NIH (2). Vital signs stable. Denies pain. Tolerating ordered diet. Incontinent of urine. SB on the monitor 40's and 50's (MD aware). Possible discharge today.

## 2015-08-29 NOTE — Progress Notes (Signed)
Discharge instructions sent electronically by social worker  IV removed per policy and pt [facility].  Orson Apeanielle Haydyn Girvan, RN

## 2015-08-29 NOTE — Clinical Social Work Note (Signed)
Clinical Social Work Assessment  Patient Details  Name: Stacey Baker MRN: [id number] Date of Birth: 03/31/1932  Date of referral:  08/28/15               Reason for consult:  Facility Placement                Permission sought to share information with:  Case Manager, Magazine features editoracility Contact Representative, Family Supports Permission granted to share information::  Yes, Verbal Permission Granted  Name::        Agency::  [facility]  Relationship::  Son/Stacey Baker  Contact Information:     Housing/Transportation Living arrangements for the past 2 months:  [facility] Source of Information:  Patient, Medical Team, Facility Patient Interpreter Needed:  None Criminal Activity/Legal Involvement Pertinent to Current Situation/Hospitalization:  No - Comment as needed Significant Relationships:  Adult Children, Merchandiser, retailCommunity Support Lives with:  Facility Resident Do you feel safe going back to the place where you live?  Yes Need for family participation in patient care:  Yes (Comment)  Care giving concerns:  [facility] she [facility] years. Pt reports that she is from [place], moved to [place] to be closer to her son as her health declined.    Social Worker assessment / plan:  CSW was referred to Pt to assist with dc planning. Pt is widowed (10+ yrs), has 2 adult children, is a retired Curatorsecrretary from [place]. Pt is alert and oriented and able to participate in dc planning. CSW reviewed SNF recommendations with Pt. Pt stated that she would like to go back to ALF and "work with the ladies" who provide physical therapy for Pt. CSW will contact Rn at ALF to discuss [name] return to ALF versus SNF. [name] insurance will not cover a STR at SNF. [facility]   Employment status:  Retired Health and safety inspectornsurance information:  [employer] PT Recommendations:  Skilled Nursing Facility Information / Referral to community resources:  Skilled Nursing Facility  Patient/Family's  Response to care: Pt expressed [facility] at [facility].   Patient/Family's Understanding of and Emotional Response to Diagnosis, Current Treatment, and Prognosis:  Pt understands recommendations, she has been to SNF before several years ago. Pt states that she prefers to return to ALF but understands the benefits of SNF. CSW will follow up with [name] son who has at work today.   Emotional Assessment Appearance:  Appears stated age Attitude/Demeanor/Rapport:   (engaged, cooperative) Affect (typically observed):  Accepting, Adaptable Orientation:  Oriented to Self, Oriented to Place, Oriented to  Time, Oriented to Situation Alcohol / Substance use:  Never Used Psych involvement (Current and /or in the community):  No (Comment)  Discharge Needs  Concerns to be addressed:  Adjustment to Illness, Discharge Planning Concerns Readmission within the last 30 days:  No Current discharge risk:  Dependent with Mobility Barriers to Discharge:  Inadequate or no insurance   Stacey Cardara N Kathrin Folden, LCSW 08/29/2015, 9:29 AM

## 2015-08-29 NOTE — Discharge Instructions (Signed)
Weakness °Weakness is a lack of strength. You may feel weak all over your body or just in one part of your body. Weakness can be serious. In some cases, you may need more medical tests. °HOME CARE °· Rest. °· Eat a well-balanced diet. °· Try to exercise every day. °· Only take medicines as told by your doctor. °GET HELP RIGHT AWAY IF:  °· You cannot do your normal daily activities. °· You cannot walk up and down stairs, or you feel very tired when you do so. °· You have shortness of breath or chest pain. °· You have trouble moving parts of your body. °· You have weakness in only one body part or on only one side of the body. °· You have a fever. °· You have trouble speaking or swallowing. °· You cannot control when you pee (urinate) or poop (bowel movement). °· You have black or bloody throw up (vomit) or poop. °· Your weakness gets worse or spreads to other body parts. °· You have new aches or pains. °MAKE SURE YOU:  °· Understand these instructions. °· Will watch your condition. °· Will get help right away if you are not doing well or get worse. °  °This information is not intended to replace advice given to you by your health care provider. Make sure you discuss any questions you have with your health care provider. °  °Document Released: 09/18/2008 Document Revised: 04/06/2012 Document Reviewed: 12/05/2011 °Elsevier Interactive Patient Education ©2016 [employer] ° °

## 2015-08-29 NOTE — Progress Notes (Signed)
CSW spoke to RN at [facility] and Pt is able to return at DC with PT/OT. CSW updated MD. ALF will be able to provide transportation at dc at 1pm.  CSW spoke to [name] son [name] and he is aware of discharge and in agreement with [name] return back to ALF. CSW updated FL2 and sent to facility.    No further CSW needs.   Wilford Gristara Perlie Stene, LCSW [phone]

## 2015-09-13 ENCOUNTER — Inpatient Hospital Stay
Admission: EM | Admit: 2015-09-13 | Discharge: 2015-09-15 | DRG: 603 | Disposition: A | Discharge status: Home Health | Payer: Medicare Other | Attending: Internal Medicine | Admitting: Internal Medicine

## 2015-09-13 ENCOUNTER — Emergency Department: Payer: Medicare Other

## 2015-09-13 DIAGNOSIS — E875 Hyperkalemia: Secondary | ICD-10-CM | POA: Diagnosis present

## 2015-09-13 DIAGNOSIS — L03211 Cellulitis of face: Principal | ICD-10-CM | POA: Diagnosis present

## 2015-09-13 DIAGNOSIS — K449 Diaphragmatic hernia without obstruction or gangrene: Secondary | ICD-10-CM | POA: Diagnosis present

## 2015-09-13 DIAGNOSIS — T464X5A Adverse effect of angiotensin-converting-enzyme inhibitors, initial encounter: Secondary | ICD-10-CM | POA: Diagnosis present

## 2015-09-13 DIAGNOSIS — F0390 Unspecified dementia without behavioral disturbance: Secondary | ICD-10-CM | POA: Diagnosis present

## 2015-09-13 DIAGNOSIS — Z79899 Other long term (current) drug therapy: Secondary | ICD-10-CM

## 2015-09-13 DIAGNOSIS — Z8673 Personal history of transient ischemic attack (TIA), and cerebral infarction without residual deficits: Secondary | ICD-10-CM

## 2015-09-13 DIAGNOSIS — F329 Major depressive disorder, single episode, unspecified: Secondary | ICD-10-CM | POA: Diagnosis present

## 2015-09-13 DIAGNOSIS — N189 Chronic kidney disease, unspecified: Secondary | ICD-10-CM | POA: Diagnosis present

## 2015-09-13 DIAGNOSIS — Z880 Allergy status to penicillin: Secondary | ICD-10-CM

## 2015-09-13 DIAGNOSIS — R52 Pain, unspecified: Secondary | ICD-10-CM

## 2015-09-13 DIAGNOSIS — L03113 Cellulitis of right upper limb: Secondary | ICD-10-CM | POA: Diagnosis present

## 2015-09-13 DIAGNOSIS — I517 Cardiomegaly: Secondary | ICD-10-CM | POA: Diagnosis present

## 2015-09-13 DIAGNOSIS — E785 Hyperlipidemia, unspecified: Secondary | ICD-10-CM | POA: Diagnosis present

## 2015-09-13 DIAGNOSIS — Y929 Unspecified place or not applicable: Secondary | ICD-10-CM | POA: Diagnosis not present

## 2015-09-13 DIAGNOSIS — Z888 Allergy status to other drugs, medicaments and biological substances status: Secondary | ICD-10-CM

## 2015-09-13 DIAGNOSIS — G20 Parkinson's disease: Secondary | ICD-10-CM | POA: Diagnosis present

## 2015-09-13 DIAGNOSIS — Z7982 Long term (current) use of aspirin: Secondary | ICD-10-CM

## 2015-09-13 DIAGNOSIS — F0280 Dementia in other diseases classified elsewhere without behavioral disturbance: Secondary | ICD-10-CM | POA: Diagnosis present

## 2015-09-13 DIAGNOSIS — R001 Bradycardia, unspecified: Secondary | ICD-10-CM | POA: Diagnosis present

## 2015-09-13 DIAGNOSIS — J449 Chronic obstructive pulmonary disease, unspecified: Secondary | ICD-10-CM | POA: Diagnosis present

## 2015-09-13 DIAGNOSIS — I10 Essential (primary) hypertension: Secondary | ICD-10-CM | POA: Diagnosis present

## 2015-09-13 DIAGNOSIS — F32A Depression, unspecified: Secondary | ICD-10-CM | POA: Diagnosis present

## 2015-09-13 DIAGNOSIS — I129 Hypertensive chronic kidney disease with stage 1 through stage 4 chronic kidney disease, or unspecified chronic kidney disease: Secondary | ICD-10-CM | POA: Diagnosis present

## 2015-09-13 DIAGNOSIS — L0390 Cellulitis, unspecified: Secondary | ICD-10-CM | POA: Diagnosis present

## 2015-09-13 LAB — CBC
HEMATOCRIT: 33.8 % — AB (ref 35.0–47.0)
Hemoglobin: 11.3 g/dL — ABNORMAL LOW (ref 12.0–16.0)
MCH: 30.4 pg (ref 26.0–34.0)
MCHC: 33.5 g/dL (ref 32.0–36.0)
MCV: 90.6 fL (ref 80.0–100.0)
Platelets: 120 10*3/uL — ABNORMAL LOW (ref 150–440)
RBC: 3.73 MIL/uL — ABNORMAL LOW (ref 3.80–5.20)
RDW: 14.6 % — AB (ref 11.5–14.5)
WBC: 4.9 10*3/uL (ref 3.6–11.0)

## 2015-09-13 LAB — BASIC METABOLIC PANEL
ANION GAP: 5 (ref 5–15)
BUN: 33 mg/dL — ABNORMAL HIGH (ref 6–20)
CO2: 23 mmol/L (ref 22–32)
Calcium: 8.5 mg/dL — ABNORMAL LOW (ref 8.9–10.3)
Chloride: 112 mmol/L — ABNORMAL HIGH (ref 101–111)
Creatinine, Ser: 1.34 mg/dL — ABNORMAL HIGH (ref 0.44–1.00)
GFR, EST AFRICAN AMERICAN: 42 mL/min — AB (ref >60)
GFR, EST NON AFRICAN AMERICAN: 36 mL/min — AB (ref >60)
Glucose, Bld: 114 mg/dL — ABNORMAL HIGH (ref 65–99)
POTASSIUM: 5.5 mmol/L — AB (ref 3.5–5.1)
SODIUM: 140 mmol/L (ref 135–145)

## 2015-09-13 LAB — URINALYSIS COMPLETE WITH MICROSCOPIC (ARMC ONLY)
BILIRUBIN URINE: NEGATIVE
Bacteria, UA: NONE SEEN
GLUCOSE, UA: NEGATIVE mg/dL
LEUKOCYTES UA: NEGATIVE
NITRITE: NEGATIVE
Protein, ur: NEGATIVE mg/dL
SPECIFIC GRAVITY, URINE: 1.015 (ref 1.005–1.030)
pH: 5 (ref 5.0–8.0)

## 2015-09-13 LAB — LACTIC ACID, PLASMA: Lactic Acid, Venous: 1.2 mmol/L (ref 0.5–2.0)

## 2015-09-13 MED ORDER — DONEPEZIL HCL 5 MG PO TABS
5.0000 mg | ORAL_TABLET | Freq: Every day | ORAL | Status: DC
  Administered 2015-09-13 – 2015-09-14 (×2): 5 mg via ORAL
  Filled 2015-09-13 (×2): qty 1

## 2015-09-13 MED ORDER — LEVETIRACETAM 500 MG PO TABS
500.0000 mg | ORAL_TABLET | Freq: Two times a day (BID) | ORAL | Status: DC
  Administered 2015-09-13 – 2015-09-15 (×4): 500 mg via ORAL
  Filled 2015-09-13 (×4): qty 1

## 2015-09-13 MED ORDER — HEPARIN SODIUM (PORCINE) 5000 UNIT/ML IJ SOLN
5000.0000 [IU] | Freq: Three times a day (TID) | INTRAMUSCULAR | Status: DC
  Administered 2015-09-13 – 2015-09-14 (×4): 5000 [IU] via SUBCUTANEOUS
  Filled 2015-09-13 (×5): qty 1

## 2015-09-13 MED ORDER — ATORVASTATIN CALCIUM 10 MG PO TABS
10.0000 mg | ORAL_TABLET | Freq: Every day | ORAL | Status: DC
  Administered 2015-09-13 – 2015-09-14 (×2): 10 mg via ORAL
  Filled 2015-09-13 (×2): qty 1

## 2015-09-13 MED ORDER — MIRTAZAPINE 15 MG PO TABS
15.0000 mg | ORAL_TABLET | Freq: Every day | ORAL | Status: DC
  Administered 2015-09-13 – 2015-09-14 (×2): 15 mg via ORAL
  Filled 2015-09-13 (×2): qty 1

## 2015-09-13 MED ORDER — ONDANSETRON HCL 4 MG PO TABS: 4.0000 mg | ORAL_TABLET | Freq: Four times a day (QID) | ORAL | Status: DC | PRN

## 2015-09-13 MED ORDER — ACETAMINOPHEN 325 MG PO TABS: 650.0000 mg | ORAL_TABLET | Freq: Four times a day (QID) | ORAL | Status: DC | PRN

## 2015-09-13 MED ORDER — ASPIRIN EC 81 MG PO TBEC
81.0000 mg | DELAYED_RELEASE_TABLET | Freq: Every day | ORAL | Status: DC
  Administered 2015-09-14 – 2015-09-15 (×2): 81 mg via ORAL
  Filled 2015-09-13 (×2): qty 1

## 2015-09-13 MED ORDER — METOPROLOL TARTRATE 25 MG PO TABS
25.0000 mg | ORAL_TABLET | Freq: Two times a day (BID) | ORAL | Status: DC
  Administered 2015-09-14 – 2015-09-15 (×2): 25 mg via ORAL
  Filled 2015-09-13 (×3): qty 1

## 2015-09-13 MED ORDER — ACETAMINOPHEN 650 MG RE SUPP: 650.0000 mg | Freq: Four times a day (QID) | RECTAL | Status: DC | PRN

## 2015-09-13 MED ORDER — SODIUM CHLORIDE 0.9 % IJ SOLN
3.0000 mL | Freq: Two times a day (BID) | INTRAMUSCULAR | Status: DC
  Administered 2015-09-13 – 2015-09-15 (×4): 3 mL via INTRAVENOUS

## 2015-09-13 MED ORDER — ONDANSETRON HCL 4 MG/2ML IJ SOLN: 4.0000 mg | Freq: Four times a day (QID) | INTRAMUSCULAR | Status: DC | PRN

## 2015-09-13 MED ORDER — CARBIDOPA-LEVODOPA 25-100 MG PO TABS
1.0000 | ORAL_TABLET | Freq: Four times a day (QID) | ORAL | Status: DC
  Administered 2015-09-13 – 2015-09-15 (×6): 1 via ORAL
  Filled 2015-09-13 (×6): qty 1

## 2015-09-13 MED ORDER — CLINDAMYCIN PHOSPHATE 600 MG/50ML IV SOLN
600.0000 mg | Freq: Three times a day (TID) | INTRAVENOUS | Status: DC
  Administered 2015-09-14 – 2015-09-15 (×5): 600 mg via INTRAVENOUS
  Filled 2015-09-13 (×10): qty 50

## 2015-09-13 MED ORDER — LISINOPRIL 10 MG PO TABS
10.0000 mg | ORAL_TABLET | Freq: Two times a day (BID) | ORAL | Status: DC
  Administered 2015-09-13 – 2015-09-15 (×4): 10 mg via ORAL
  Filled 2015-09-13 (×4): qty 1

## 2015-09-13 MED ORDER — QUETIAPINE FUMARATE 25 MG PO TABS
25.0000 mg | ORAL_TABLET | Freq: Every day | ORAL | Status: DC
  Administered 2015-09-13 – 2015-09-14 (×2): 25 mg via ORAL
  Filled 2015-09-13 (×3): qty 1

## 2015-09-13 MED ORDER — ESCITALOPRAM OXALATE 10 MG PO TABS
10.0000 mg | ORAL_TABLET | Freq: Every day | ORAL | Status: DC
  Administered 2015-09-14 – 2015-09-15 (×2): 10 mg via ORAL
  Filled 2015-09-13 (×2): qty 1

## 2015-09-13 MED ORDER — VANCOMYCIN HCL IN DEXTROSE 1-5 GM/200ML-% IV SOLN
1000.0000 mg | Freq: Once | INTRAVENOUS | Status: AC
  Administered 2015-09-13: 1000 mg via INTRAVENOUS
  Filled 2015-09-13: qty 200

## 2015-09-13 NOTE — H&P (Signed)
[facility] [place] [facility]   PATIENT NAME: Stacey Baker Spratley    MR#:  [id number]  DATE OF BIRTH:  07/08/1932  DATE OF ADMISSION:  09/13/2015  PRIMARY CARE PHYSICIAN: Merlene PullingMCGRANAGHAN, MARY BETH, PA-C   REQUESTING/REFERRING PHYSICIAN: Huel CoteQuigley, M.D.  CHIEF COMPLAINT:   Chief Complaint  Patient presents with  . Fatigue    HISTORY OF PRESENT ILLNESS:  Stacey Baker Caples  is a 81 y.o. female who presents with cellulitis of right hand and right cheek. Patient came in for chief complaint of fatigue, but was found here to have a significant right hand cellulitis with development of a large bulla. She also has an area of redness on her right cheek. Patient denies any fever or chills, or any other significant infectious symptoms. She was found to have elevated white count, and was afebrile with stable vital signs. She was given IV antibiotics in the ED, and hospitalists were called for admission.  PAST MEDICAL HISTORY:   Past Medical History  Diagnosis Date  . Stroke (HCC)   . Dementia   . Parkinson disease (HCC)   . Hypertension   . Hyperlipidemia   . Depression     PAST SURGICAL HISTORY:   Past Surgical History  Procedure Laterality Date  . Abdominal hysterectomy    . Cholecystectomy      SOCIAL HISTORY:   Social History  Substance Use Topics  . Smoking status: Never Smoker   . Smokeless tobacco: Not on file  . Alcohol Use: No    FAMILY HISTORY:   Family History  Problem Relation Age of Onset  . Hypertension Other     DRUG ALLERGIES:   Allergies  Allergen Reactions  . Penicillins Other (See Comments)    Reaction:  Unknown   . Plavix [Clopidogrel Bisulfate] Other (See Comments)    Reaction:  Unknown     MEDICATIONS AT HOME:   Prior to Admission medications   Medication Sig Start Date End Date Taking? Authorizing Provider  acetaminophen (TYLENOL) 325 MG tablet Take 650 mg by mouth every 4 (four) hours as needed for mild pain.     Historical Provider, MD  aspirin EC 81 MG tablet Take 81 mg by mouth daily.    Historical Provider, MD  atorvastatin (LIPITOR) 10 MG tablet Take 10 mg by mouth at bedtime.    Historical Provider, MD  carbidopa-levodopa (SINEMET IR) 25-100 MG tablet Take 1 tablet by mouth 4 (four) times daily.    Historical Provider, MD  diclofenac sodium (VOLTAREN) 1 % GEL Apply 2 g topically 2 (two) times daily. Pt applies to left knee.    Historical Provider, MD  donepezil (ARICEPT) 5 MG tablet Take 5 mg by mouth at bedtime.    Historical Provider, MD  escitalopram (LEXAPRO) 10 MG tablet Take 10 mg by mouth daily.    Historical Provider, MD  ferrous sulfate 325 (65 FE) MG tablet Take 325 mg by mouth 2 (two) times daily.    Historical Provider, MD  fluticasone (FLONASE) 50 MCG/ACT nasal spray Place 1 spray into both nostrils daily as needed for rhinitis.    Historical Provider, MD  guaiFENesin (MUCINEX) 600 MG 12 hr tablet Take 600 mg by mouth 2 (two) times daily as needed for to loosen phlegm.    Historical Provider, MD  hydrOXYzine (ATARAX/VISTARIL) 25 MG tablet Take 25 mg by mouth every 6 (six) hours as needed.    Historical Provider, MD  levETIRAcetam (KEPPRA) 500 MG tablet Take 500 mg  by mouth 2 (two) times daily.    Historical Provider, MD  lisinopril (PRINIVIL,ZESTRIL) 10 MG tablet Take 10 mg by mouth 2 (two) times daily.    Historical Provider, MD  metoprolol tartrate (LOPRESSOR) 25 MG tablet Take 25 mg by mouth 2 (two) times daily.    Historical Provider, MD  mirtazapine (REMERON) 15 MG tablet Take 15 mg by mouth at bedtime.    Historical Provider, MD  QUEtiapine (SEROQUEL) 25 MG tablet Take 25 mg by mouth at bedtime.    Historical Provider, MD  vitamin B-12 (CYANOCOBALAMIN) 1000 MCG tablet Take 1,000 mcg by mouth daily.    Historical Provider, MD    REVIEW OF SYSTEMS:  Review of Systems  Constitutional: Negative for fever, chills, weight loss and malaise/fatigue.  HENT: Negative for ear pain, hearing  loss and tinnitus.   Eyes: Negative for blurred vision, double vision, pain and redness.  Respiratory: Negative for cough, hemoptysis and shortness of breath.   Cardiovascular: Negative for chest pain, palpitations, orthopnea and leg swelling.  Gastrointestinal: Negative for nausea, vomiting, abdominal pain, diarrhea and constipation.  Genitourinary: Negative for dysuria, frequency and hematuria.  Musculoskeletal: Positive for joint pain (right hand). Negative for back pain and neck pain.  Skin:       Right hand and right face erythema  Neurological: Negative for dizziness, tremors, focal weakness and weakness.  Endo/Heme/Allergies: Negative for polydipsia. Does not bruise/bleed easily.  Psychiatric/Behavioral: Negative for depression. The patient is not nervous/anxious and does not have insomnia.      VITAL SIGNS:   Filed Vitals:   09/13/15 1930 09/13/15 2000 09/13/15 2030 09/13/15 2100  BP: 154/68 177/66 171/62 158/60  Pulse: 51 50 45 47  Temp:      TempSrc:      Resp: Height:      Weight:      SpO2: 98% 100% 99% 93%   Wt Readings from Last 3 Encounters:  09/13/15 72.122 kg (159 lb)  08/27/15 61.236 kg (135 lb)  08/28/15 61.236 kg (135 lb)    PHYSICAL EXAMINATION:  Physical Exam  Vitals reviewed. Constitutional: She is oriented to person, place, and time. She appears well-developed and well-nourished. No distress.  HENT:  Head: Normocephalic and atraumatic.  Mouth/Throat: Oropharynx is clear and moist.  Eyes: Conjunctivae and EOM are normal. Pupils are equal, round, and reactive to light. No scleral icterus.  Neck: Normal range of motion. Neck supple. No JVD present. No thyromegaly present.  Cardiovascular: Normal rate, regular rhythm and intact distal pulses.  Exam reveals no gallop and no friction rub.   No murmur heard. Respiratory: Effort normal and breath sounds normal. No respiratory distress. She has no wheezes. She has no rales.  GI: Soft. Bowel  sounds are normal. She exhibits no distension. There is no tenderness.  Musculoskeletal: Normal range of motion. She exhibits no edema.  No arthritis, no gout  Lymphadenopathy:    She has no cervical adenopathy.  Neurological: She is alert and oriented to person, place, and time. No cranial nerve deficit.  No dysarthria, no aphasia  Skin: Skin is warm and dry. No rash noted. There is erythema (right hand and right face).  Psychiatric: She has a normal mood and affect. Her behavior is normal. Judgment and thought content normal.    LABORATORY PANEL:   CBC  Recent Labs Lab 09/13/15 1945  WBC 4.9  HGB 11.3*  HCT 33.8*  PLT 120*   ------------------------------------------------------------------------------------------------------------------  Chemistries  Recent Labs Lab 09/13/15 1926  NA 140  K 5.5*  CL 112*  CO2 23  GLUCOSE 114*  BUN 33*  CREATININE 1.34*  CALCIUM 8.5*   ------------------------------------------------------------------------------------------------------------------  Cardiac Enzymes No results for input(s): TROPONINI in the last 168 hours. ------------------------------------------------------------------------------------------------------------------  RADIOLOGY:  Dg Chest 2 View  09/13/2015  CLINICAL DATA:  Weakness.  Lethargy.  Multiple bug bites. EXAM: CHEST  2 VIEW COMPARISON:  08/27/2015 and 01/12/2015 FINDINGS: Mild cardiomegaly and ectasia thoracic aorta are stable. Bibasilar scarring is again seen, right side greater than left. No evidence of pulmonary infiltrate or pleural effusion. Pulmonary hyperinflation again noted, suspicious COPD. Small hiatal hernia again noted. IMPRESSION: Probable COPD and bibasilar scarring, without significant change. Stable cardiomegaly and small hiatal hernia. Electronically Signed   By: Myles Rosenthal M.D.   On: 09/13/2015 21:44    EKG:   Orders placed or performed during the hospital encounter of 09/13/15   . ED EKG  . ED EKG    IMPRESSION AND PLAN:  Principal Problem:   Cellulitis - patient has right hand cellulitis with large bulla and some cellulitis of her right face. Unclear etiology for the cellulitis, and IV antibiotics were started in the ED. Blood cultures were sent. We'll continue IV antibiotics for now and monitor. Active Problems:   HTN (hypertension) - stable, continue home meds   Dementia - mild, continue home meds   Parkinson disease (HCC) - continue home meds   HLD (hyperlipidemia) - continue home meds   Depression - stable, continue home meds  All the records are reviewed and case discussed with ED provider. Management plans discussed with the patient and/or family.  DVT PROPHYLAXIS: Subcutaneous heparin  ADMISSION STATUS: Inpatient  CODE STATUS: Full Advance Directive Documentation        Most Recent Value   Type of Advance Directive  Healthcare Power of Attorney   Pre-existing out of facility DNR order (yellow form or pink MOST form)     "MOST" Form in Place?        TOTAL TIME TAKING CARE OF THIS PATIENT: 45 minutes.    Leanora Murin FIELDING 09/13/2015, 10:10 PM  [employer]  Office  [phone]  CC: Primary care physician; [name] BETH, PA-C

## 2015-09-13 NOTE — ED Provider Notes (Signed)
Time Seen: Approximately. 1900  I have reviewed the triage notes  Chief Complaint: Fatigue   History of Present Illness: Stacey Baker is a 81 y.o. female who presents with generalized weakness. Patient a recent admission here to the hospital for acute cerebrovascular accident. Extensive evaluation did not show any obvious etiology with negative carotid Dopplers and echocardiogram etc. She denies any focal weakness in that she has a history of dementia is able to answer most questions appropriately. She states she feels generalized weakness and was noted that she had skin lesion on the posterior surface of her right hand and also below her right eye on her cheek area. Areas are new and the patient states questionable sting or bite. She denies any fever that she is aware of. She denies any chest or abdominal pain   Past Medical History  Diagnosis Date  . Stroke (HCC)   . Dementia   . Parkinson disease (HCC)   . Hypertension   . Hyperlipidemia   . Depression     Patient Active Problem List   Diagnosis Date Noted  . TIA (transient ischemic attack) 08/27/2015    History reviewed. No pertinent past surgical history.  History reviewed. No pertinent past surgical history.  Current Outpatient Rx  Name  Route  Sig  Dispense  Refill  . acetaminophen (TYLENOL) 325 MG tablet   Oral   Take 650 mg by mouth every 4 (four) hours as needed for mild pain.         Marland Kitchen. aspirin EC 81 MG tablet   Oral   Take 81 mg by mouth daily.         Marland Kitchen. atorvastatin (LIPITOR) 10 MG tablet   Oral   Take 10 mg by mouth at bedtime.         . carbidopa-levodopa (SINEMET IR) 25-100 MG tablet   Oral   Take 1 tablet by mouth 4 (four) times daily.         . diclofenac sodium (VOLTAREN) 1 % GEL   Topical   Apply 2 g topically 2 (two) times daily. Pt applies to left knee.         . donepezil (ARICEPT) 5 MG tablet   Oral   Take 5 mg by mouth at bedtime.         Marland Kitchen. escitalopram (LEXAPRO) 10 MG  tablet   Oral   Take 10 mg by mouth daily.         . ferrous sulfate 325 (65 FE) MG tablet   Oral   Take 325 mg by mouth 2 (two) times daily.         . fluticasone (FLONASE) 50 MCG/ACT nasal spray   Each Nare   Place 1 spray into both nostrils daily as needed for rhinitis.         Marland Kitchen. guaiFENesin (MUCINEX) 600 MG 12 hr tablet   Oral   Take 600 mg by mouth 2 (two) times daily as needed for to loosen phlegm.         . hydrOXYzine (ATARAX/VISTARIL) 25 MG tablet   Oral   Take 25 mg by mouth every 6 (six) hours as needed.         . levETIRAcetam (KEPPRA) 500 MG tablet   Oral   Take 500 mg by mouth 2 (two) times daily.         Marland Kitchen. lisinopril (PRINIVIL,ZESTRIL) 10 MG tablet   Oral   Take 10 mg by mouth 2 (two) times  daily.         . metoprolol tartrate (LOPRESSOR) 25 MG tablet   Oral   Take 25 mg by mouth 2 (two) times daily.         . mirtazapine (REMERON) 15 MG tablet   Oral   Take 15 mg by mouth at bedtime.         Marland Kitchen QUEtiapine (SEROQUEL) 25 MG tablet   Oral   Take 25 mg by mouth at bedtime.         . vitamin B-12 (CYANOCOBALAMIN) 1000 MCG tablet   Oral   Take 1,000 mcg by mouth daily.           Allergies:  Penicillins and Plavix  Family History: Family History  Problem Relation Age of Onset  . Hypertension Other     Social History: Social History  Substance Use Topics  . Smoking status: Never Smoker   . Smokeless tobacco: None  . Alcohol Use: No     Review of Systems:   10 point review of systems was performed and was otherwise negative:  Constitutional: No fever Eyes: No visual disturbances ENT: No sore throat, ear pain Cardiac: No chest pain Respiratory: No shortness of breath, wheezing, or stridor Abdomen: No abdominal pain, no vomiting, No diarrhea Endocrine: No weight loss, No night sweats Extremities: No peripheral edema, cyanosis Skin: No rashes, easy bruising Neurologic: No new focal weakness, trouble with speech or  swollowing Urologic: No dysuria, Hematuria, or urinary frequency   Physical Exam:  ED Triage Vitals  Enc Vitals Group     BP 09/13/15 1918 153/56 mmHg     Pulse Rate 09/13/15 1918 51     Resp 09/13/15 1918 18     Temp 09/13/15 1918 97.7 F (36.5 C)     Temp Source 09/13/15 1918 Oral     SpO2 09/13/15 1918 95 %     Weight 09/13/15 1918 159 lb (72.122 kg)     Height 09/13/15 1918  (1.6 m)     Head Cir --      Peak Flow --      Pain Score 09/13/15 1932 0     Pain Loc --      Pain Edu? --      Excl. in GC? --     General: Awake , Alert , and Oriented times 3; GCS 15 Head: Patient has an area of redness and swelling below her right eye approximately 3 cm oval shaped there is no fluctuance or ecchymosis , atraumatic Eyes: Pupils equal , round, reactive to light Nose/Throat: No nasal drainage, patent upper airway without erythema or exudate.  Neck: Supple, Full range of motion, No anterior adenopathy or palpable thyroid masses Lungs: Clear to ascultation without wheezes , rhonchi, or rales Heart: Regular rate, regular rhythm without murmurs , gallops , or rubs Abdomen: Soft, non tender without rebound, guarding , or rigidity; bowel sounds positive and symmetric in all 4 quadrants. No organomegaly .        Extremities: Examination of right hand shows chronic affects of rheumatoid arthritis with a blistered red skin lesion on the back of her third digit she seems to have full range of motion there is no anterior findings are circumferential nature. Blistered burn type lesion with redness that extends to the back of the right hand. Neurologic: 4 out of 4 Motor symmetric without deficits, sensory intact Skin: warm, dry, no rashes   Labs:   All laboratory work was reviewed  including any pertinent negatives or positives listed below:  Labs Reviewed  BASIC METABOLIC PANEL  URINALYSIS COMPLETEWITH MICROSCOPIC ([facility] ONLY)  CBG MONITORING, ED    EKG:  ED ECG REPORT I, Jennye Moccasin, the attending physician, personally viewed and interpreted this ECG.  Date: 09/13/2015 EKG Time: *1913 Rate: 49 Rhythm: normal sinus rhythm QRS Axis: Right axis deviation Intervals: normal ST/T Wave abnormalities: normal Conduction Disutrbances: none Narrative Interpretation: unremarkable No acute ischemic changes   Radiology:  EXAM: CHEST 2 VIEW  COMPARISON: 08/27/2015 and 01/12/2015  FINDINGS: Mild cardiomegaly and ectasia thoracic aorta are stable. Bibasilar scarring is again seen, right side greater than left. No evidence of pulmonary infiltrate or pleural effusion. Pulmonary hyperinflation again noted, suspicious COPD. Small hiatal hernia again noted.  IMPRESSION: Probable COPD and bibasilar scarring, without significant change.  Stable cardiomegaly and small hiatal hernia.    I personally reviewed the radiologic studies    ED Course:  Patient's stay here was uneventful with her generalized weakness we cannot ascertain any focal neurologic signs. Concern is over the skin lesions on the back of her right hand and also below her right eye. These appear to be some form of bullous possible staphylococcal type infection. The patient was started on vancomycin. Blood cultures 2 and lactic acid levels are pending. But I felt the patient was unlikely to be septic.    Assessment: Hand and face cellulitis         Plan:  Inpatient management I spoke to the hospitalist team, further disposition and management depends upon their evaluation.           Jennye Moccasin, MD 09/13/15 2158

## 2015-09-13 NOTE — ED Notes (Signed)
MD at bedside. 

## 2015-09-13 NOTE — ED Notes (Signed)
Pt arrived via EMS complaining of feeling weak and lethargic; pt was bitten by a bug on her hand, face, and back with visible swelling and blisters.  EMS Vitals CBG 140, BP 130/80, HR 52, O2 97%, and RR 18.

## 2015-09-14 ENCOUNTER — Inpatient Hospital Stay: Payer: Medicare Other

## 2015-09-14 LAB — CBC
HEMATOCRIT: 32.6 % — AB (ref 35.0–47.0)
HEMOGLOBIN: 10.9 g/dL — AB (ref 12.0–16.0)
MCH: 29.9 pg (ref 26.0–34.0)
MCHC: 33.3 g/dL (ref 32.0–36.0)
MCV: 89.8 fL (ref 80.0–100.0)
PLATELETS: 114 10*3/uL — AB (ref 150–440)
RBC: 3.64 MIL/uL — AB (ref 3.80–5.20)
RDW: 14.5 % (ref 11.5–14.5)
WBC: 4.3 10*3/uL (ref 3.6–11.0)

## 2015-09-14 LAB — BASIC METABOLIC PANEL
ANION GAP: 3 — AB (ref 5–15)
BUN: 30 mg/dL — ABNORMAL HIGH (ref 6–20)
CHLORIDE: 111 mmol/L (ref 101–111)
CO2: 27 mmol/L (ref 22–32)
Calcium: 8.6 mg/dL — ABNORMAL LOW (ref 8.9–10.3)
Creatinine, Ser: 1.21 mg/dL — ABNORMAL HIGH (ref 0.44–1.00)
GFR calc non Af Amer: 41 mL/min — ABNORMAL LOW (ref >60)
GFR, EST AFRICAN AMERICAN: 47 mL/min — AB (ref >60)
Glucose, Bld: 85 mg/dL (ref 65–99)
POTASSIUM: 4.6 mmol/L (ref 3.5–5.1)
SODIUM: 141 mmol/L (ref 135–145)

## 2015-09-14 LAB — URIC ACID: Uric Acid, Serum: 5.3 mg/dL (ref 2.3–6.6)

## 2015-09-14 MED ORDER — HYDRALAZINE HCL 20 MG/ML IJ SOLN
10.0000 mg | INTRAMUSCULAR | Status: DC | PRN
  Administered 2015-09-14: 10 mg via INTRAVENOUS
  Filled 2015-09-14: qty 1

## 2015-09-14 NOTE — Care Management Important Message (Signed)
Important Message  Patient Details  Name: Stacey Baker MRN: [id number] Date of Birth: 07/20/1932   Medicare Important Message Given:  Yes    Marily MemosLisa M Alexanderia Gorby, RN 09/14/2015, 12:43 PM

## 2015-09-14 NOTE — Progress Notes (Signed)
[facility] [place] at [facility]   PATIENT NAME: Stacey Baker    MR#:  [id number]  DATE OF BIRTH:  05-18-1932  SUBJECTIVE:  CHIEF COMPLAINT:   Chief Complaint  Patient presents with  . Fatigue   patient is 81 year old Caucasian female with past medical history significant for history of stroke, dementia, Parkinson's disease, hypertension, hyperlipidemia, depression who presents to the hospital with  Fatigue. On arrival to emergency room, she was noted to have right hand erythema with development of large bulla on of the right fourth finger and redness and swelling of her right face. Patient was initiated on clindamycin and vancomycin was no significant improvement of her symptoms. She admits of having fleeing symptoms such as swelling and pain in her bilateral hand metacarpophalangeal joints.   Review of Systems  Constitutional: Negative for fever, chills and weight loss.  HENT: Negative for congestion.   Eyes: Negative for blurred vision and double vision.  Respiratory: Negative for cough, sputum production, shortness of breath and wheezing.   Cardiovascular: Negative for chest pain, palpitations, orthopnea, leg swelling and PND.  Gastrointestinal: Negative for nausea, vomiting, abdominal pain, diarrhea, constipation and blood in stool.  Genitourinary: Negative for dysuria, urgency, frequency and hematuria.  Musculoskeletal: Negative for falls.  Neurological: Negative for dizziness, tremors, focal weakness and headaches.  Endo/Heme/Allergies: Does not bruise/bleed easily.  Psychiatric/Behavioral: Negative for depression. The patient does not have insomnia.    right facial erythema as well as swelling and the swelling in right hand, including bullous formation on the fourth finger skin  VITAL SIGNS: Blood pressure 131/61, pulse 51, temperature 97.3 F (36.3 C), temperature source Oral, resp. rate 16, height  (1.6 m), weight 65.318 kg (144 lb), SpO2 98  %.  PHYSICAL EXAMINATION:   GENERAL:  81 y.o.-year-old patient lying in the bed with no acute distress.  EYES: Pupils equal, round, reactive to light and accommodation. No scleral icterus. Extraocular muscles intact.  HEENT: Head atraumatic, normocephalic. Oropharynx and nasopharynx clear.  NECK:  Supple, no jugular venous distention. No thyroid enlargement, no tenderness.  LUNGS: Normal breath sounds bilaterally, no wheezing, rales,rhonchi or crepitation. No use of accessory muscles of respiration.  CARDIOVASCULAR: S1, S2 normal. No murmurs, rubs, or gallops.  ABDOMEN: Soft, nontender, nondistended. Bowel sounds present. No organomegaly or mass.  EXTREMITIES: No pedal edema, cyanosis, or clubbing.  NEUROLOGIC: Cranial nerves II through XII are intact. Muscle strength 5/5 in all extremities. Sensation intact. Gait not checked.  PSYCHIATRIC: The patient is alert and oriented x 3.  SKIN: Ulnar deviation of her right hand fingers. Some swelling of metacarpophalangeal joints as well as erythema of her dorsal aspect of the hand with bullous formation on the fourth finger, mild erythema with swelling over the right cheek  ORDERS/RESULTS REVIEWED:   CBC  Recent Labs Lab 09/13/15 1945 09/14/15 0448  WBC 4.9 4.3  HGB 11.3* 10.9*  HCT 33.8* 32.6*  PLT 120* 114*  MCV 90.6 89.8  MCH 30.4 29.9  MCHC 33.5 33.3  RDW 14.6* 14.5   ------------------------------------------------------------------------------------------------------------------  Chemistries   Recent Labs Lab 09/13/15 1926 09/14/15 0448  NA 140 141  K 5.5* 4.6  CL 112* 111  CO2 23 27  GLUCOSE 114* 85  BUN 33* 30*  CREATININE 1.34* 1.21*  CALCIUM 8.5* 8.6*   ------------------------------------------------------------------------------------------------------------------ estimated creatinine clearance is 33.2 mL/min (by C-G formula based on Cr of  1.21). ------------------------------------------------------------------------------------------------------------------ No results for input(s): TSH, T4TOTAL, T3FREE, THYROIDAB in the last 72 hours.  Invalid input(s): FREET3  Cardiac Enzymes No results for input(s): CKMB, TROPONINI, MYOGLOBIN in the last 168 hours.  Invalid input(s): CK ------------------------------------------------------------------------------------------------------------------ Invalid input(s): POCBNP ---------------------------------------------------------------------------------------------------------------  RADIOLOGY: Dg Chest 2 View  09/13/2015  CLINICAL DATA:  Weakness.  Lethargy.  Multiple bug bites. EXAM: CHEST  2 VIEW COMPARISON:  08/27/2015 and 01/12/2015 FINDINGS: Mild cardiomegaly and ectasia thoracic aorta are stable. Bibasilar scarring is again seen, right side greater than left. No evidence of pulmonary infiltrate or pleural effusion. Pulmonary hyperinflation again noted, suspicious COPD. Small hiatal hernia again noted. IMPRESSION: Probable COPD and bibasilar scarring, without significant change. Stable cardiomegaly and small hiatal hernia. Electronically Signed   By: Myles RosenthalJohn  Stahl M.D.   On: 09/13/2015 21:44   Dg Hand Complete Right  09/14/2015  CLINICAL DATA:  Right middle finger pain and swelling. Initial encounter. EXAM: RIGHT HAND - COMPLETE 3+ VIEW COMPARISON:  None. FINDINGS: Three views study shows no evidence for an acute fracture. Subluxation is noted at the MCP joint of the middle finger. Large area of soft tissue swelling noted over the PIP joint of the middle finger. No underlying bony abnormality is evident. The scattered degenerative changes noted in the PIP joints. IMPRESSION: Focal soft tissue swelling at the PIP joint of the middle finger without underlying acute bony abnormality. Electronically Signed   By: Kennith CenterEric  Mansell M.D.   On: 09/14/2015 11:58    EKG:  Orders placed or performed  during the hospital encounter of 09/13/15  . ED EKG  . ED EKG    ASSESSMENT AND PLAN:  Principal Problem:   Cellulitis Active Problems:   Parkinson disease (HCC)   HTN (hypertension)   HLD (hyperlipidemia)   Depression   Dementia   Hyperkalemia 1. Right cheek cellulitis ,  right hand cellulitis, continue antibiotic therapy for now, blood cultures and negative 2. Right hand deformation, x-ray reveals no acute fracture, but subluxation of middle finger MCP joint, there is degenerative changes were noted in PIP joints, likely osteoarthritic changes, uric acid level was unremarkable, getting rheumatologist involved for recommendations 3. Hyperkalemia, resolved with IV fluids 4. Renal insufficiency,  Chronic, urinalysis unremarkable. No obvious UTI   Management plans discussed with the patient, family and they are in agreement.   DRUG ALLERGIES:  Allergies  Allergen Reactions  . Penicillins Other (See Comments)    Reaction:  Unknown   . Plavix [Clopidogrel Bisulfate] Other (See Comments)    Reaction:  Unknown     CODE STATUS:     Code Status Orders        Start     Ordered   09/13/15 2256  Full code   Continuous     09/13/15 2255    Advance Directive Documentation        Most Recent Value   Type of Advance Directive  Healthcare Power of Attorney   Pre-existing out of facility DNR order (yellow form or pink MOST form)     "MOST" Form in Place?        TOTAL TIME TAKING CARE OF THIS PATIENT: 35 minutes.    Katharina CaperVAICKUTE,Arnola Crittendon M.D on 09/14/2015 at 3:02 PM  Between 7am to 6pm - Pager - [phone]  After 6pm go to [email or web address] - password [email or web address] [facility]  [facility]  Office  [phone]  CC: Primary care physician; Odelia GageMCGRANAGHAN, MARY BETH, PA-C

## 2015-09-14 NOTE — Evaluation (Signed)
Physical Therapy Evaluation Patient Details Name: Stacey Baker MRN: [id number] DOB: [birth year] Today's Date: 09/14/2015   History of Present Illness  Pt is a pleasant 81 y/o female that presents with swelling and possible infection in R hand and face. Pt recently admitted for CVA like symptoms, CT was negative. She has a history of dementia and Parkinson's disease.   Clinical Impression  PAtient reports she has recently been making attempts at short distance ambulation at her ALF with RW and has been transferring to and from a wheelchair with assistance. She currently presents at approximately this same level as she is able to take small steps today to transfer from the bed to the chair with RW. 2 episodes of knee buckling noted, though she was able to self correct. Patient has been seeing a PT at her facility x4 days per week, which appears appropriate for her discharge recommendations. She does demonstrate L sided weakness, though she notes this has been chronic and is not new.     Follow Up Recommendations Home health PT (At her ALF)    Equipment Recommendations       Recommendations for Other Services       Precautions / Restrictions Precautions Precautions: Fall Restrictions Weight Bearing Restrictions: No      Mobility  Bed Mobility Overal bed mobility: Needs Assistance Bed Mobility: Supine to Sit     Supine to sit: Min guard     General bed mobility comments: Patient uses hand rails with L hand to bring herself to the EOB. No physical assistance required.   Transfers Overall transfer level: Needs assistance Equipment used: Rolling walker (2 wheeled) Transfers: Sit to/from Stand Sit to Stand: Mod assist         General transfer comment: Patient requires cuing for safe hand placement as well as technique (bringing her shoulders anteriorly). Poor foot placement noted as well, required cuing for foot placement.   Ambulation/Gait Ambulation/Gait assistance: Min  assist;Mod assist Ambulation Distance (Feet): 3 Feet Assistive device: Rolling walker (2 wheeled)       General Gait Details: Patient is able to complete small steps to complete turn to transfer from bedside chair to bed, she demonstrates 2 episodes of mini-knee buckles though she is able to self correct for these.   Stairs            Wheelchair Mobility    Modified Rankin (Stroke Patients Only)       Balance Overall balance assessment: Needs assistance Sitting-balance support: Bilateral upper extremity supported Sitting balance-Leahy Scale: Fair Sitting balance - Comments: Patient tends to lean to her right, she is able to hold when corrected by PT, but with fatigue she continues to lean to the R.  Postural control: Posterior lean;Right lateral lean Standing balance support: Bilateral upper extremity supported Standing balance-Leahy Scale: Poor Standing balance comment: Patient demonstrates 2 episodes of knee buckling during ambulation, though she was able to self correct.                              Pertinent Vitals/Pain Pain Assessment: No/denies pain    Home Living Family/patient expects to be discharged to:: Assisted living               Home Equipment: Walker - 2 wheels;Wheelchair - manual Additional Comments: Patient presents from Assisted living.     Prior Function Level of Independence: Needs assistance   Gait / Transfers Assistance Needed: Patient has  been assisted with RW to transfer to and from wheelchair.            Hand Dominance        Extremity/Trunk Assessment   Upper Extremity Assessment:  (Able to touch the top of her head bilaterally )           Lower Extremity Assessment:  (Noted LLE weaker than RLE, unable to formally test as patient did not consistently follow commands)         Communication   Communication: HOH (Aphasia noted in chart, not observed in this session. )  Cognition Arousal/Alertness:  Awake/alert Behavior During Therapy: WFL for tasks assessed/performed Overall Cognitive Status: History of cognitive impairments - at baseline (Patient repeats concepts throughout session, she is appropriate in her conversation)                      General Comments General comments (skin integrity, edema, etc.): Large cyst like lesion noted on R 3rd finger, swelling generally thoughout R hand.     Exercises General Exercises - Lower Extremity Short Arc QuadBarbaraann Boys: AAROM;Left;10 reps Long Arc Quad: AROM;AAROM;Both;10 reps Heel Slides: AROM;AAROM;Both;10 reps Hip ABduction/ADduction: AROM;AAROM;Both;10 reps Straight Leg Raises: AROM;AAROM;Both;10 reps Other Exercises Other Exercises: Sit to stand performed additional 2x from chair with additional cuing for hand and foot placement.       Assessment/Plan    PT Assessment Patient needs continued PT services  PT Diagnosis Difficulty walking;Generalized weakness   PT Problem List Decreased strength;Decreased mobility;Decreased activity tolerance;Decreased balance  PT Treatment Interventions Therapeutic activities;Therapeutic exercise;Balance training;Functional mobility training;Wheelchair mobility training;Gait training   PT Goals (Current goals can be found in the Care Plan section) Acute Rehab PT Goals Patient Stated Goal: To return to her prior level of function.  PT Goal Formulation: With patient Time For Goal Achievement: 09/28/15 Potential to Achieve Goals: Good    Frequency Min 2X/week   Barriers to discharge        Co-evaluation               End of Session Equipment Utilized During Treatment: Gait belt Activity Tolerance: Patient tolerated treatment well Patient left: in chair;with chair alarm set;with call bell/phone within reach Nurse Communication: Mobility status         Time: [id number] PT Time Calculation (min) (ACUTE ONLY): 28 min   Charges:   PT Evaluation $Initial PT Evaluation Tier I: 1  Procedure PT Treatments $Therapeutic Exercise: 8-22 mins   PT G Codes:       Kerin RansomPatrick A McNamara, PT, DPT    09/14/2015, 4:37 PM

## 2015-09-15 DIAGNOSIS — R001 Bradycardia, unspecified: Secondary | ICD-10-CM

## 2015-09-15 DIAGNOSIS — N189 Chronic kidney disease, unspecified: Secondary | ICD-10-CM

## 2015-09-15 LAB — CBC
HEMATOCRIT: 32 % — AB (ref 35.0–47.0)
HEMOGLOBIN: 10.8 g/dL — AB (ref 12.0–16.0)
MCH: 30.3 pg (ref 26.0–34.0)
MCHC: 33.8 g/dL (ref 32.0–36.0)
MCV: 89.6 fL (ref 80.0–100.0)
Platelets: 110 10*3/uL — ABNORMAL LOW (ref 150–440)
RBC: 3.57 MIL/uL — AB (ref 3.80–5.20)
RDW: 14.7 % — AB (ref 11.5–14.5)
WBC: 4.3 10*3/uL (ref 3.6–11.0)

## 2015-09-15 LAB — POTASSIUM: Potassium: 5 mmol/L (ref 3.5–5.1)

## 2015-09-15 MED ORDER — CLINDAMYCIN HCL 300 MG PO CAPS: 300.0000 mg | ORAL_CAPSULE | Freq: Three times a day (TID) | ORAL | Status: DC

## 2015-09-15 MED ORDER — AMLODIPINE BESYLATE 10 MG PO TABS: 10.0000 mg | ORAL_TABLET | Freq: Every day | ORAL | Status: DC

## 2015-09-15 NOTE — Progress Notes (Signed)
Pt stable. Report called to [facility] and EMS called for transport. IV will be removed.

## 2015-09-15 NOTE — Discharge Summary (Signed)
[facility] - [place] at [facility]   PATIENT NAME: Stacey Baker    MR#:  [id number]  DATE OF BIRTH:  Aug 17, 1932  DATE OF ADMISSION:  09/13/2015 ADMITTING PHYSICIAN: Oralia Manis, MD  DATE OF DISCHARGE: No discharge date for patient encounter.  PRIMARY CARE PHYSICIAN: MCGRANAGHAN, MARY BETH, PA-C     ADMISSION DIAGNOSIS:  Cellulitis of face [L03.211]  DISCHARGE DIAGNOSIS:  Principal Problem:   Cellulitis Active Problems:   Hyperkalemia   Chronic renal insufficiency   Bradycardia   Parkinson disease (HCC)   HTN (hypertension)   HLD (hyperlipidemia)   Depression   Dementia   SECONDARY DIAGNOSIS:   Past Medical History  Diagnosis Date  . Stroke (HCC)   . Dementia   . Parkinson disease (HCC)   . Hypertension   . Hyperlipidemia   . Depression     .pro HOSPITAL COURSE:   The patient is 80 year old Caucasian female with past medical history significant for history of stroke, dementia, Parkinson's disease, hypertension, hyperlipidemia, depression who presented to the hospital with fatigue. On arrival to emergency room, she was noted to have right hand erythema with development of large bulla on one of the fingers and redness and swelling of her right face. Patient was initiated on clindamycin and vancomycin with significant improvement of her symptoms. She had completed a right hand x-ray, which revealed focal soft tissue swelling at the PIP joint in the middle finger without underlying acute bony abnormality. Patient was evaluated by physical therapist and recommended home health physical therapy. t was felt the patient is stable to be discharged back home on 09/15/2015. Patient was noted to be bradycardic, which was felt to be metoprolol related, so metoprolol is being stopped. Patient was also noted to have hyperkalemia, which was felt to be due to lisinopril and underlying chronic renal insufficiency, so lisinopril was stopped Discussion by the  problem 1. Right cheek , right hand cellulitis, have improved significantly, bulla has collapsed and drained , continue clindamycin for 7 more days to complete course orally, blood cultures are negative 2 Hyperkalemia, likely due to ACE inhibitor, worsened by chronic renal disease , discontinue lisinopril 3. Bradycardia to 40 as, lowest was 47 , relatively asymptomatic , discontinue metoprolol for now, resume metoprolol depending on patient's needs 4. Renal insufficiency, Chronic, urinalysis unremarkable. No obvious UTI 5. Essential hypertension, initiate Norvasc, which is not going to affect patient's bradycardia or hyperkalemia, follow-up with primary care physician for further recommendations 6. Generalized weakness, patient was evaluated by physical therapist and recommended home health physical therapy, which will be prescribed upon discharge DISCHARGE CONDITIONS:   Stable  CONSULTS OBTAINED:     DRUG ALLERGIES:   Allergies  Allergen Reactions  . Penicillins Other (See Comments)    Reaction:  Unknown   . Plavix [Clopidogrel Bisulfate] Other (See Comments)    Reaction:  Unknown     DISCHARGE MEDICATIONS:   Current Discharge Medication List    START taking these medications   Details  amLODipine (NORVASC) 10 MG tablet Take 1 tablet (10 mg total) by mouth daily. Qty: 30 tablet, Refills: 5    clindamycin (CLEOCIN) 300 MG capsule Take 1 capsule (300 mg total) by mouth 3 (three) times daily. Qty: 21 capsule, Refills: 0      CONTINUE these medications which have NOT CHANGED   Details  acetaminophen (TYLENOL) 325 MG tablet Take 650 mg by mouth every 4 (four) hours as needed for mild pain.    aspirin EC  81 MG tablet Take 81 mg by mouth daily.    atorvastatin (LIPITOR) 10 MG tablet Take 10 mg by mouth at bedtime.    carbidopa-levodopa (SINEMET IR) 25-100 MG tablet Take 1 tablet by mouth 4 (four) times daily.    diclofenac sodium (VOLTAREN) 1 % GEL Apply 2 g topically 2  (two) times daily. Pt applies to left knee.    donepezil (ARICEPT) 5 MG tablet Take 5 mg by mouth at bedtime.    escitalopram (LEXAPRO) 10 MG tablet Take 10 mg by mouth daily.    ferrous sulfate 325 (65 FE) MG tablet Take 325 mg by mouth 2 (two) times daily.    fluticasone (FLONASE) 50 MCG/ACT nasal spray Place 1 spray into both nostrils daily as needed for rhinitis.    guaiFENesin (MUCINEX) 600 MG 12 hr tablet Take 600 mg by mouth 2 (two) times daily as needed for to loosen phlegm.    hydrOXYzine (ATARAX/VISTARIL) 25 MG tablet Take 25 mg by mouth every 6 (six) hours as needed.    levETIRAcetam (KEPPRA) 500 MG tablet Take 500 mg by mouth 2 (two) times daily.    mirtazapine (REMERON) 15 MG tablet Take 15 mg by mouth at bedtime.    QUEtiapine (SEROQUEL) 25 MG tablet Take 25 mg by mouth at bedtime.    vitamin B-12 (CYANOCOBALAMIN) 1000 MCG tablet Take 1,000 mcg by mouth daily.      STOP taking these medications     lisinopril (PRINIVIL,ZESTRIL) 10 MG tablet      metoprolol tartrate (LOPRESSOR) 25 MG tablet          DISCHARGE INSTRUCTIONS:    Patient is to follow-up with primary care physician as outpatient  If you experience worsening of your admission symptoms, develop shortness of breath, life threatening emergency, suicidal or homicidal thoughts you must seek medical attention immediately by calling 911 or calling your MD immediately  if symptoms less severe.  You Must read complete instructions/literature along with all the possible adverse reactions/side effects for all the Medicines you take and that have been prescribed to you. Take any new Medicines after you have completely understood and accept all the possible adverse reactions/side effects.   Please note  You were cared for by a hospitalist during your hospital stay. If you have any questions about your discharge medications or the care you received while you were in the hospital after you are discharged, you can  call the unit and asked to speak with the hospitalist on call if the hospitalist that took care of you is not available. Once you are discharged, your primary care physician will handle any further medical issues. Please note that NO REFILLS for any discharge medications will be authorized once you are discharged, as it is imperative that you return to your primary care physician (or establish a relationship with a primary care physician if you do not have one) for your aftercare needs so that they can reassess your need for medications and monitor your lab values.    Today   CHIEF COMPLAINT:   Chief Complaint  Patient presents with  . Fatigue    HISTORY OF PRESENT ILLNESS:  Stacey Baker  is a 81 y.o. female with a known history of stroke, dementia, Parkinson's disease, hypertension, hyperlipidemia, depression who presented to the hospital with fatigue. On arrival to emergency room, she was noted to have right hand erythema with development of large bulla on one of the fingers and redness and swelling of her right  face. Patient was initiated on clindamycin and vancomycin with significant improvement of her symptoms. She had completed a right hand x-ray, which revealed focal soft tissue swelling at the PIP joint in the middle finger without underlying acute bony abnormality. Patient was evaluated by physical therapist and recommended home health physical therapy. t was felt the patient is stable to be discharged back home on 09/15/2015. Patient was noted to be bradycardic, which was felt to be metoprolol related, so metoprolol is being stopped. Patient was also noted to have hyperkalemia, which was felt to be due to lisinopril and underlying chronic renal insufficiency, so lisinopril was stopped Discussion by the problem 1. Right cheek , right hand cellulitis, have improved significantly, bulla has collapsed and drained , continue clindamycin for 7 more days to complete course orally, blood cultures  are negative 2 Hyperkalemia, likely due to ACE inhibitor, worsened by chronic renal disease , discontinue lisinopril 3. Bradycardia to 40 as, lowest was 47 , relatively asymptomatic , discontinue metoprolol for now, resume metoprolol depending on patient's needs 4. Renal insufficiency, Chronic, urinalysis unremarkable. No obvious UTI 5. Essential hypertension, initiate Norvasc, which is not going to affect patient's bradycardia or hyperkalemia, follow-up with primary care physician for further recommendations 6. Generalized weakness, patient was evaluated by physical therapist and recommended home health physical therapy, which will be prescribed upon discharge    VITAL SIGNS:  Blood pressure 139/56, pulse 62, temperature 98.4 F (36.9 C), temperature source Oral, resp. rate 20, height 5\' 3"  (1.6 m), weight 65.318 kg (144 lb), SpO2 95 %.  I/O:   Intake/Output Summary (Last 24 hours) at 09/15/15 1334 Last data filed at 09/15/15 0900  Gross per 24 hour  Intake    193 ml  Output      0 ml  Net    193 ml    PHYSICAL EXAMINATION:  GENERAL:  81 y.o.-year-old patient lying in the bed with no acute distress.  EYES: Pupils equal, round, reactive to light and accommodation. No scleral icterus. Extraocular muscles intact.  HEENT: Head atraumatic, normocephalic. Oropharynx and nasopharynx clear.  NECK:  Supple, no jugular venous distention. No thyroid enlargement, no tenderness.  LUNGS: Normal breath sounds bilaterally, no wheezing, rales,rhonchi or crepitation. No use of accessory muscles of respiration.  CARDIOVASCULAR: S1, S2 normal. No murmurs, rubs, or gallops.  ABDOMEN: Soft, non-tender, non-distended. Bowel sounds present. No organomegaly or mass.  EXTREMITIES: No pedal edema, cyanosis, or clubbing.  NEUROLOGIC: Cranial nerves II through XII are intact. Muscle strength 5/5 in all extremities. Sensation intact. Gait not checked.  PSYCHIATRIC: The patient is alert and oriented x 3.  SKIN:  No obvious rash, lesion, or ulcer.   DATA REVIEW:   CBC  Recent Labs Lab 09/15/15 0446  WBC 4.3  HGB 10.8*  HCT 32.0*  PLT 110*    Chemistries   Recent Labs Lab 09/14/15 0448 09/15/15 0446  NA 141  --   K 4.6 5.0  CL 111  --   CO2 27  --   GLUCOSE 85  --   BUN 30*  --   CREATININE 1.21*  --   CALCIUM 8.6*  --     Cardiac Enzymes No results for input(s): TROPONINI in the last 168 hours.  Microbiology Results  Results for orders placed or performed during the hospital encounter of 09/13/15  Culture, blood (routine x 2)     Status: None (Preliminary result)   Collection Time: 09/13/15  7:45 PM  Result Value Ref Range  Status   Specimen Description BLOOD LEFT HAND  Final   Special Requests BOTTLES DRAWN AEROBIC AND ANAEROBIC  Final   Culture NO GROWTH 2 DAYS  Final   Report Status PENDING  Incomplete  Culture, blood (routine x 2)     Status: None (Preliminary result)   Collection Time: 09/13/15  9:10 PM  Result Value Ref Range Status   Specimen Description BLOOD LEFT ANTECUBITAL  Final   Special Requests BOTTLES DRAWN AEROBIC AND ANAEROBIC  Final   Culture NO GROWTH 2 DAYS  Final   Report Status PENDING  Incomplete    RADIOLOGY:  Dg Chest 2 View  09/13/2015  CLINICAL DATA:  Weakness.  Lethargy.  Multiple bug bites. EXAM: CHEST  2 VIEW COMPARISON:  08/27/2015 and 01/12/2015 FINDINGS: Mild cardiomegaly and ectasia thoracic aorta are stable. Bibasilar scarring is again seen, right side greater than left. No evidence of pulmonary infiltrate or pleural effusion. Pulmonary hyperinflation again noted, suspicious COPD. Small hiatal hernia again noted. IMPRESSION: Probable COPD and bibasilar scarring, without significant change. Stable cardiomegaly and small hiatal hernia. Electronically Signed   By: Myles Rosenthal M.D.   On: 09/13/2015 21:44   Dg Hand Complete Right  09/14/2015  CLINICAL DATA:  Right middle finger pain and swelling. Initial encounter. EXAM: RIGHT  HAND - COMPLETE 3+ VIEW COMPARISON:  None. FINDINGS: Three views study shows no evidence for an acute fracture. Subluxation is noted at the MCP joint of the middle finger. Large area of soft tissue swelling noted over the PIP joint of the middle finger. No underlying bony abnormality is evident. The scattered degenerative changes noted in the PIP joints. IMPRESSION: Focal soft tissue swelling at the PIP joint of the middle finger without underlying acute bony abnormality. Electronically Signed   By: Kennith Center M.D.   On: 09/14/2015 11:58    EKG:   Orders placed or performed during the hospital encounter of 09/13/15  . ED EKG  . ED EKG      Management plans discussed with the patient, family and they are in agreement.  CODE STATUS:     Code Status Orders        Start     Ordered   09/13/15 2256  Full code   Continuous     09/13/15 2255    Advance Directive Documentation        Most Recent Value   Type of Advance Directive  Healthcare Power of Attorney   Pre-existing out of facility DNR order (yellow form or pink MOST form)     "MOST" Form in Place?        TOTAL TIME TAKING CARE OF THIS PATIENT: 40 minutes.    Katharina Caper M.D on 09/15/2015 at 1:34 PM  Between 7am to 6pm - Pager - [phone]  After 6pm go to [email or web address] - password [email or web address] [facility]  [facility]  Office  [phone]  CC: Primary care physician; [name] BETH, PA-C

## 2015-09-15 NOTE — Progress Notes (Signed)
[name] HR went up to 62 with activity

## 2015-09-15 NOTE — Progress Notes (Signed)
Spoke to Dr. Seth BakeV about low HR, high 40s. MD acknowledged. Will walk pt to see how HR improves and continue to monitor.

## 2015-09-15 NOTE — [place] FL2 (Signed)
[place] MEDICAID FL2 LEVEL OF CARE SCREENING TOOL     IDENTIFICATION  Patient Name: Stacey Baker Birthdate: 1932-05-30 Sex: female Admission Date (Current Location): 09/13/2015  [place] Number:  Air cabin crew)   Facility and Address:  [facility], [address] [place], [place] [address]      Provider Number: [id number]  Attending Physician Name and Address:  Katharina Caper, MD  Relative Name and Phone Number:       Current Level of Care: Hospital Recommended Level of Care: Assisted Living Facility Prior Approval Number:    Date Approved/Denied:   PASRR Number:    Discharge Plan: Domiciliary (Rest home)    Current Diagnoses: Patient Active Problem List   Diagnosis Date Noted  . Chronic renal insufficiency 09/15/2015  . Bradycardia 09/15/2015  . Cellulitis 09/13/2015  . Parkinson disease (HCC) 09/13/2015  . HTN (hypertension) 09/13/2015  . HLD (hyperlipidemia) 09/13/2015  . Depression 09/13/2015  . Dementia 09/13/2015  . Hyperkalemia 09/13/2015  . TIA (transient ischemic attack) 08/27/2015    Orientation ACTIVITIES/SOCIAL BLADDER RESPIRATION    Self, Time, Situation, Place    Incontinent Normal  BEHAVIORAL SYMPTOMS/MOOD NEUROLOGICAL BOWEL NUTRITION STATUS      Continent Diet  PHYSICIAN VISITS COMMUNICATION OF NEEDS Height & Weight Skin    Verbally  (160 cm) 159 lbs. Normal          AMBULATORY STATUS RESPIRATION    Supervision limited Normal      Personal Care Assistance Level of Assistance  Bathing, Feeding Bathing Assistance: Limited assistance Feeding assistance: Independent Dressing Assistance: Limited assistance      Functional Limitations Info  Sight, Hearing Sight Info: Adequate Hearing Info: Adequate Speech Info: Adequate       SPECIAL CARE FACTORS FREQUENCY  PT (By licensed PT)     PT Frequency:  (See order for home health PT)             Additional Factors Info  Allergies    Allergies Info:  (Penicillins, Plavix)           Current Medications (09/15/2015): Current Facility-Administered Medications  Medication Dose Route Frequency Provider Last Rate Last Dose  . acetaminophen (TYLENOL) tablet 650 mg  650 mg Oral Q6H PRN Oralia Manis, MD       Or  . acetaminophen (TYLENOL) suppository 650 mg  650 mg Rectal Q6H PRN Oralia Manis, MD      . aspirin EC tablet 81 mg  81 mg Oral Daily Oralia Manis, MD   81 mg at 09/15/15 0907  . atorvastatin (LIPITOR) tablet 10 mg  10 mg Oral QHS Oralia Manis, MD   10 mg at 09/14/15 2228  . carbidopa-levodopa (SINEMET IR) 25-100 MG per tablet immediate release 1 tablet  1 tablet Oral QID Oralia Manis, MD   1 tablet at 09/15/15 0907  . clindamycin (CLEOCIN) IVPB 600 mg  600 mg Intravenous 3 times per day Oralia Manis, MD   600 mg at 09/15/15 0519  . donepezil (ARICEPT) tablet 5 mg  5 mg Oral QHS Oralia Manis, MD   5 mg at 09/14/15 2227  . escitalopram (LEXAPRO) tablet 10 mg  10 mg Oral Daily Oralia Manis, MD   10 mg at 09/15/15 5621  . heparin injection 5,000 Units  5,000 Units Subcutaneous 3 times per day Oralia Manis, MD   5,000 Units at 09/14/15 2227  . hydrALAZINE (APRESOLINE) injection 10 mg  10 mg Intravenous Q4H PRN Ihor Austin, MD   10  mg at 09/14/15 [id number]  . levETIRAcetam (KEPPRA) tablet 500 mg  500 mg Oral BID Oralia Manisavid Willis, MD   500 mg at 09/15/15 [id number]  . mirtazapine (REMERON) tablet 15 mg  15 mg Oral QHS Oralia Manisavid Willis, MD   15 mg at 09/14/15 2228  . ondansetron (ZOFRAN) tablet 4 mg  4 mg Oral Q6H PRN Oralia Manisavid Willis, MD       Or  . ondansetron [facility](ZOFRAN) injection 4 mg  4 mg Intravenous Q6H PRN Oralia Manisavid Willis, MD      . QUEtiapine (SEROQUEL) tablet 25 mg  25 mg Oral QHS Oralia Manisavid Willis, MD   25 mg at 09/14/15 2234  . sodium chloride 0.9 % injection 3 mL  3 mL Intravenous Q12H Oralia Manisavid Willis, MD   3 mL at 09/15/15 1000   Do not use this list as official medication orders. Please verify with discharge summary.  Discharge  Medications:  Current Discharge Medication List    START taking these medications   Details  amLODipine (NORVASC) 10 MG tablet Take 1 tablet (10 mg total) by mouth daily. Qty: 30 tablet, Refills: 5    clindamycin (CLEOCIN) 300 MG capsule Take 1 capsule (300 mg total) by mouth 3 (three) times daily. Qty: 21 capsule, Refills: 0      CONTINUE these medications which have NOT CHANGED   Details  acetaminophen (TYLENOL) 325 MG tablet Take 650 mg by mouth every 4 (four) hours as needed for mild pain.    aspirin EC 81 MG tablet Take 81 mg by mouth daily.    atorvastatin (LIPITOR) 10 MG tablet Take 10 mg by mouth at bedtime.    carbidopa-levodopa (SINEMET IR) 25-100 MG tablet Take 1 tablet by mouth 4 (four) times daily.    diclofenac sodium (VOLTAREN) 1 % GEL Apply 2 g topically 2 (two) times daily. Pt applies to left knee.    donepezil (ARICEPT) 5 MG tablet Take 5 mg by mouth at bedtime.    escitalopram (LEXAPRO) 10 MG tablet Take 10 mg by mouth daily.    ferrous sulfate 325 (65 FE) MG tablet Take 325 mg by mouth 2 (two) times daily.    fluticasone (FLONASE) 50 MCG/ACT nasal spray Place 1 spray into both nostrils daily as needed for rhinitis.    guaiFENesin (MUCINEX) 600 MG 12 hr tablet Take 600 mg by mouth 2 (two) times daily as needed for to loosen phlegm.    hydrOXYzine (ATARAX/VISTARIL) 25 MG tablet Take 25 mg by mouth every 6 (six) hours as needed.    levETIRAcetam (KEPPRA) 500 MG tablet Take 500 mg by mouth 2 (two) times daily.    mirtazapine (REMERON) 15 MG tablet Take 15 mg by mouth at bedtime.    QUEtiapine (SEROQUEL) 25 MG tablet Take 25 mg by mouth at bedtime.    vitamin B-12 (CYANOCOBALAMIN) 1000 MCG tablet Take 1,000 mcg by mouth daily.      STOP taking these medications     lisinopril (PRINIVIL,ZESTRIL) 10 MG tablet      metoprolol tartrate (LOPRESSOR) 25 MG tablet                Relevant Imaging Results:  Relevant Lab Results:  Recent Labs    Additional Information    Soundra PilonMoore, Elspeth Blucher H, LCSW

## 2015-09-15 NOTE — Care Management Note (Addendum)
Case Management Note  Patient Details  Name: Stacey Baker MRN: [id number] Date of Birth: 02/04/1932  Subjective/Objective:   Left phone message on son's cell and continue to await a call back. Dr Winona LegatoVaickute ordered home health RN, PT, and nurse aid for [name] who [facility]). A referral was faxed to [facility] requesting home health PT, RN, Aid. Sammuel Hineseborah Moore, CSW is arranging transportation back to [facility], and called and spoke with Hale BogusKalisha at [facility] to inform her that [name] will be returning to [facility] today. [facility]-RN will call nurse to nurse report.                 Action/Plan:   Expected Discharge Date:                  Expected Discharge Plan:     In-House Referral:     Discharge planning Services     Post Acute Care Choice:    Choice offered to:     DME Arranged:    DME Agency:     HH Arranged:    HH Agency:     Status of Service:     Medicare Important Message Given:  Yes Date Medicare IM Given:    Medicare IM give by:    Date Additional Medicare IM Given:    Additional Medicare Important Message give by:     If discussed at Long Length of Stay Meetings, dates discussed:    Additional Comments:  Kiaria Quinnell A, RN 09/15/2015, 2:19 PM

## 2015-09-15 NOTE — Clinical Social Work Note (Signed)
Clinical Social Work Assessment  Patient Details  Name: Stacey Baker MRN: [id number] Date of Birth: 07/14/1932  Date of referral:  09/15/15               Reason for consult:   (From [facility])                Permission sought to share information with:  Facility Medical sales representativeContact Representative, Family Supports Permission granted to share information::  Yes, Verbal Permission Granted  Name::      (son [name])  Agency::     Relationship::     Contact Information:     Housing/Transportation Living arrangements for the past 2 months:  [facility] Source of Information:  Medical Team, Adult Children, Facility, Patient Patient Interpreter Needed:  None Criminal Activity/Legal Involvement Pertinent to Current Situation/Hospitalization:  No - Comment as needed Significant Relationships:  Adult Children Lives with:  Facility Resident Do you feel safe going back to the place where you live?  Yes Need for family participation in patient care:     Care giving concerns:  none   Social Worker assessment / plan:  Pt is from [facility] where she has lived 2 years. Pt reports that she is from [place], moved to [place] to be closer to her son as her health declined. Pt is widowed (10+ yrs), has 2 adult children, is a retired Diplomatic Services operational officersecretary from [place]. Pt is alert and oriented and able to participate in dc planning.  [facility].    [facility] to inform [facility] patient will discharge back today. Per MD Rx has been faxed to pharmacy. Facility is unable to transport patient back.  Call to patient's son [name], waiting on return call.    CSW will complete FL2,  in anticipation of patient returning [place] [facility] at discharge.       Employment status:  Disabled (Comment on whether or not currently receiving Disability) Insurance information:  Medicare PT Recommendations:  Home with Home Health Information / Referral to community resources:      Patient/Family's Response to care:  Patient and son was appreciative of information provided by CSW.   Patient/Family's Understanding of and Emotional Response to Diagnosis, Current Treatment, and Prognosis:  Patient and son understand that patient will [facility].  Emotional Assessment Appearance:    Attitude/Demeanor/Rapport:    Affect (typically observed):  Pleasant Orientation:  Oriented to Self, Oriented to Place, Oriented to Situation Alcohol / Substance use:    Psych involvement (Current and /or in the community):  No (Comment)  Discharge Needs  Concerns to be addressed:  Care Coordination, Discharge Planning Concerns Readmission within the last 30 days:  Yes Current discharge risk:  Chronically ill, Dependent with Mobility Barriers to Discharge:  No Barriers Identified   Soundra PilonMoore, Aleksandr Pellow H, LCSW 09/15/2015, 2:43 PM

## 2015-09-15 NOTE — Progress Notes (Signed)
Clinical Social Worker informed by Winona LegatoVaickute MD that patient is medically ready to discharge back to ALF. Patient and patient's son [name] are in a agreement with plan. Patient eager to return home.  [facility] to inform them patient is ready to discharge.  Facility unable to transport patient.  Son is unable to transport patient.  CSW did inform son and patient there is no guarantee that the insurance will cover the EMS .   All discharge information faxed to  Facility. Rx's added to discharge packet.   RN will call report to Hale BogusKalisha [phone] and [facility] will [facility].  Sammuel Hineseborah Moore. Theresia MajorsLCSWA, MSW Clinical Social Work Department [phone] 3:41 PM

## 2015-09-15 NOTE — [place] FL2 (Signed)
[place] MEDICAID [id number] LEVEL OF CARE SCREENING TOOL     IDENTIFICATION  Patient Name: Stacey Baker Birthdate: Aug 25, 1932 Sex: female Admission Date (Current Location): 09/13/2015  [place] Number:  Air cabin crew)   Facility and Address:  [facility], [address] [place], [place] [address]      Provider Number: [id number]  Attending Physician Name and Address:  Katharina Caper, MD  Relative Name and Phone Number:       Current Level of Care: Hospital Recommended Level of Care: Assisted Living Facility Prior Approval Number:    Date Approved/Denied:   PASRR Number:    Discharge Plan: Domiciliary (Rest home)    Current Diagnoses: Patient Active Problem List   Diagnosis Date Noted  . Chronic renal insufficiency 09/15/2015  . Bradycardia 09/15/2015  . Cellulitis 09/13/2015  . Parkinson disease (HCC) 09/13/2015  . HTN (hypertension) 09/13/2015  . HLD (hyperlipidemia) 09/13/2015  . Depression 09/13/2015  . Dementia 09/13/2015  . Hyperkalemia 09/13/2015  . TIA (transient ischemic attack) 08/27/2015    Orientation ACTIVITIES/SOCIAL BLADDER RESPIRATION    Self, Time, Situation, Place    Incontinent Normal  BEHAVIORAL SYMPTOMS/MOOD NEUROLOGICAL BOWEL NUTRITION STATUS      Continent Diet  PHYSICIAN VISITS COMMUNICATION OF NEEDS Height & Weight Skin    Verbally  (160 cm) 159 lbs. Normal          AMBULATORY STATUS RESPIRATION    Supervision limited Normal      Personal Care Assistance Level of Assistance  Bathing, Feeding Bathing Assistance: Limited assistance Feeding assistance: Independent Dressing Assistance: Limited assistance      Functional Limitations Info  Sight, Hearing Sight Info: Adequate Hearing Info: Adequate Speech Info: Adequate       SPECIAL CARE FACTORS FREQUENCY  PT (By licensed PT)     PT Frequency:  (See order for home health PT)             Additional Factors Info  Allergies    Allergies Info:  (Penicillins, Plavix)           Current Medications (09/15/2015): Current Facility-Administered Medications  Medication Dose Route Frequency Provider Last Rate Last Dose  . acetaminophen (TYLENOL) tablet 650 mg  650 mg Oral Q6H PRN Oralia Manis, MD       Or  . acetaminophen (TYLENOL) suppository 650 mg  650 mg Rectal Q6H PRN Oralia Manis, MD      . aspirin EC tablet 81 mg  81 mg Oral Daily Oralia Manis, MD   81 mg at 09/15/15 0907  . atorvastatin (LIPITOR) tablet 10 mg  10 mg Oral QHS Oralia Manis, MD   10 mg at 09/14/15 2228  . carbidopa-levodopa (SINEMET IR) 25-100 MG per tablet immediate release 1 tablet  1 tablet Oral QID Oralia Manis, MD   1 tablet at 09/15/15 0907  . clindamycin (CLEOCIN) IVPB 600 mg  600 mg Intravenous 3 times per day Oralia Manis, MD   600 mg at 09/15/15 0519  . donepezil (ARICEPT) tablet 5 mg  5 mg Oral QHS Oralia Manis, MD   5 mg at 09/14/15 2227  . escitalopram (LEXAPRO) tablet 10 mg  10 mg Oral Daily Oralia Manis, MD   10 mg at 09/15/15 1478  . heparin injection 5,000 Units  5,000 Units Subcutaneous 3 times per day Oralia Manis, MD   5,000 Units at 09/14/15 2227  . hydrALAZINE (APRESOLINE) injection 10 mg  10 mg Intravenous Q4H PRN Ihor Austin, MD   10  mg at 09/14/15 8295  . levETIRAcetam (KEPPRA) tablet 500 mg  500 mg Oral BID Oralia Manis, MD   500 mg at 09/15/15 6213  . mirtazapine (REMERON) tablet 15 mg  15 mg Oral QHS Oralia Manis, MD   15 mg at 09/14/15 2228  . ondansetron (ZOFRAN) tablet 4 mg  4 mg Oral Q6H PRN Oralia Manis, MD       Or  . ondansetron [facility]) injection 4 mg  4 mg Intravenous Q6H PRN Oralia Manis, MD      . QUEtiapine (SEROQUEL) tablet 25 mg  25 mg Oral QHS Oralia Manis, MD   25 mg at 09/14/15 2234  . sodium chloride 0.9 % injection 3 mL  3 mL Intravenous Q12H Oralia Manis, MD   3 mL at 09/15/15 1000   Do not use this list as official medication orders. Please verify with discharge summary.  Discharge  Medications:   Medication List    STOP taking these medications        lisinopril 10 MG tablet  Commonly known as:  PRINIVIL,ZESTRIL     metoprolol tartrate 25 MG tablet  Commonly known as:  LOPRESSOR      TAKE these medications        acetaminophen 325 MG tablet  Commonly known as:  TYLENOL  Take 650 mg by mouth every 4 (four) hours as needed for mild pain.     amLODipine 10 MG tablet  Commonly known as:  NORVASC  Take 1 tablet (10 mg total) by mouth daily.     aspirin EC 81 MG tablet  Take 81 mg by mouth daily.     atorvastatin 10 MG tablet  Commonly known as:  LIPITOR  Take 10 mg by mouth at bedtime.     carbidopa-levodopa 25-100 MG tablet  Commonly known as:  SINEMET IR  Take 1 tablet by mouth 4 (four) times daily.     clindamycin 300 MG capsule  Commonly known as:  CLEOCIN  Take 1 capsule (300 mg total) by mouth 3 (three) times daily.     diclofenac sodium 1 % Gel  Commonly known as:  VOLTAREN  Apply 2 g topically 2 (two) times daily. Pt applies to left knee.     donepezil 5 MG tablet  Commonly known as:  ARICEPT  Take 5 mg by mouth at bedtime.     escitalopram 10 MG tablet  Commonly known as:  LEXAPRO  Take 10 mg by mouth daily.     ferrous sulfate 325 (65 FE) MG tablet  Take 325 mg by mouth 2 (two) times daily.     fluticasone 50 MCG/ACT nasal spray  Commonly known as:  FLONASE  Place 1 spray into both nostrils daily as needed for rhinitis.     guaiFENesin 600 MG 12 hr tablet  Commonly known as:  MUCINEX  Take 600 mg by mouth 2 (two) times daily as needed for to loosen phlegm.     hydrOXYzine 25 MG tablet  Commonly known as:  ATARAX/VISTARIL  Take 25 mg by mouth every 6 (six) hours as needed.     levETIRAcetam 500 MG tablet  Commonly known as:  KEPPRA  Take 500 mg by mouth 2 (two) times daily.     mirtazapine 15 MG tablet  Commonly known as:  REMERON  Take 15 mg by mouth at bedtime.     QUEtiapine 25 MG tablet  Commonly known as:   SEROQUEL  Take 25 mg by mouth at  bedtime.     vitamin B-12 1000 MCG tablet  Commonly known as:  CYANOCOBALAMIN  Take 1,000 mcg by mouth daily.        Relevant Imaging Results:  Relevant Lab Results:  Recent Labs    Additional Information    Soundra PilonMoore, Nupur Hohman H, LCSW

## 2015-09-19 LAB — CULTURE, BLOOD (ROUTINE X 2)
Culture: NO GROWTH
Culture: NO GROWTH

## 2015-10-31 ENCOUNTER — Ambulatory Visit: Payer: Medicare Other | Admitting: Cardiovascular Disease

## 2015-10-31 ENCOUNTER — Encounter: Payer: Self-pay | Admitting: *Deleted

## 2015-10-31 ENCOUNTER — Emergency Department
Admission: EM | Admit: 2015-10-31 | Discharge: 2015-10-31 | Disposition: A | Discharge status: Home / Self Care | Payer: Medicare Other | Attending: Emergency Medicine | Admitting: Emergency Medicine

## 2015-10-31 ENCOUNTER — Encounter: Payer: Self-pay | Admitting: Emergency Medicine

## 2015-10-31 DIAGNOSIS — Z880 Allergy status to penicillin: Secondary | ICD-10-CM | POA: Insufficient documentation

## 2015-10-31 DIAGNOSIS — Z7982 Long term (current) use of aspirin: Secondary | ICD-10-CM | POA: Insufficient documentation

## 2015-10-31 DIAGNOSIS — R220 Localized swelling, mass and lump, head: Secondary | ICD-10-CM | POA: Diagnosis not present

## 2015-10-31 DIAGNOSIS — G3183 Dementia with Lewy bodies: Secondary | ICD-10-CM | POA: Diagnosis not present

## 2015-10-31 DIAGNOSIS — Z792 Long term (current) use of antibiotics: Secondary | ICD-10-CM | POA: Diagnosis not present

## 2015-10-31 DIAGNOSIS — Z791 Long term (current) use of non-steroidal anti-inflammatories (NSAID): Secondary | ICD-10-CM | POA: Insufficient documentation

## 2015-10-31 DIAGNOSIS — I10 Essential (primary) hypertension: Secondary | ICD-10-CM | POA: Insufficient documentation

## 2015-10-31 DIAGNOSIS — R600 Localized edema: Secondary | ICD-10-CM

## 2015-10-31 DIAGNOSIS — Z79899 Other long term (current) drug therapy: Secondary | ICD-10-CM | POA: Insufficient documentation

## 2015-10-31 DIAGNOSIS — L299 Pruritus, unspecified: Secondary | ICD-10-CM | POA: Diagnosis not present

## 2015-10-31 HISTORY — DX: Encephalopathy, unspecified: G93.40

## 2015-10-31 LAB — BASIC METABOLIC PANEL
ANION GAP: 3 — AB (ref 5–15)
BUN: 34 mg/dL — AB (ref 6–20)
CALCIUM: 8.5 mg/dL — AB (ref 8.9–10.3)
CO2: 27 mmol/L (ref 22–32)
Chloride: 112 mmol/L — ABNORMAL HIGH (ref 101–111)
Creatinine, Ser: 1.61 mg/dL — ABNORMAL HIGH (ref 0.44–1.00)
GFR calc Af Amer: 33 mL/min — ABNORMAL LOW (ref >60)
GFR, EST NON AFRICAN AMERICAN: 29 mL/min — AB (ref >60)
GLUCOSE: 109 mg/dL — AB (ref 65–99)
Potassium: 4.8 mmol/L (ref 3.5–5.1)
Sodium: 142 mmol/L (ref 135–145)

## 2015-10-31 LAB — CBC WITH DIFFERENTIAL/PLATELET
BASOS PCT: 1 %
Basophils Absolute: 0 10*3/uL (ref 0–0.1)
EOS ABS: 0.4 10*3/uL (ref 0–0.7)
EOS PCT: 8 %
HEMATOCRIT: 32.6 % — AB (ref 35.0–47.0)
Hemoglobin: 10.7 g/dL — ABNORMAL LOW (ref 12.0–16.0)
Lymphocytes Relative: 30 %
Lymphs Abs: 1.4 10*3/uL (ref 1.0–3.6)
MCH: 29.5 pg (ref 26.0–34.0)
MCHC: 32.8 g/dL (ref 32.0–36.0)
MCV: 90 fL (ref 80.0–100.0)
MONO ABS: 0.4 10*3/uL (ref 0.2–0.9)
MONOS PCT: 9 %
NEUTROS ABS: 2.4 10*3/uL (ref 1.4–6.5)
Neutrophils Relative %: 52 %
PLATELETS: 135 10*3/uL — AB (ref 150–440)
RBC: 3.62 MIL/uL — ABNORMAL LOW (ref 3.80–5.20)
RDW: 14.9 % — AB (ref 11.5–14.5)
WBC: 4.6 10*3/uL (ref 3.6–11.0)

## 2015-10-31 MED ORDER — FAMOTIDINE IN NACL 20-0.9 MG/50ML-% IV SOLN
20.0000 mg | Freq: Once | INTRAVENOUS | Status: AC
  Administered 2015-10-31: 20 mg via INTRAVENOUS
  Filled 2015-10-31: qty 50

## 2015-10-31 MED ORDER — DIPHENHYDRAMINE HCL 50 MG/ML IJ SOLN
25.0000 mg | Freq: Once | INTRAMUSCULAR | Status: AC
  Administered 2015-10-31: 25 mg via INTRAVENOUS
  Filled 2015-10-31: qty 1

## 2015-10-31 MED ORDER — DEXAMETHASONE SODIUM PHOSPHATE 10 MG/ML IJ SOLN
10.0000 mg | Freq: Once | INTRAMUSCULAR | Status: AC
  Administered 2015-10-31: 10 mg via INTRAVENOUS
  Filled 2015-10-31: qty 1

## 2015-10-31 NOTE — Discharge Instructions (Signed)
Edema  Sit with your head elevated as often as possible. Also make sure to sleep on the left side of your face instead of the right side.  Return for any concerning or worsening symptoms such as difficulty breathing or worsening of the swelling on her face.  Edema is an abnormal buildup of fluids in your bodytissues. Edema is somewhatdependent on gravity to pull the fluid to the lowest place in your body. That makes the condition more common in the legs and thighs (lower extremities). Painless swelling of the feet and ankles is common and becomes more likely as you get older. It is also common in looser tissues, like around your eyes.  When the affected area is squeezed, the fluid may move out of that spot and leave a dent for a few moments. This dent is called pitting.  CAUSES  There are many possible causes of edema. Eating too much salt and being on your feet or sitting for a long time can cause edema in your legs and ankles. Hot weather may make edema worse. Common medical causes of edema include:  Heart failure.  Liver disease.  Kidney disease.  Weak blood vessels in your legs.  Cancer.  An injury.  Pregnancy.  Some medications.  Obesity. SYMPTOMS  Edema is usually painless.Your skin may look swollen or shiny.  DIAGNOSIS  Your health care provider may be able to diagnose edema by asking about your medical history and doing a physical exam. You may need to have tests such as X-rays, an electrocardiogram, or blood tests to check for medical conditions that may cause edema.  TREATMENT  Edema treatment depends on the cause. If you have heart, liver, or kidney disease, you need the treatment appropriate for these conditions. General treatment may include:  Elevation of the affected body part above the level of your heart.  Compression of the affected body part. Pressure from elastic bandages or support stockings squeezes the tissues and forces fluid back into the blood vessels.  This keeps fluid from entering the tissues.  Restriction of fluid and salt intake.  Use of a water pill (diuretic). These medications are appropriate only for some types of edema. They pull fluid out of your body and make you urinate more often. This gets rid of fluid and reduces swelling, but diuretics can have side effects. Only use diuretics as directed by your health care provider. HOME CARE INSTRUCTIONS   Keep the affected body part above the level of your heart when you are lying down.   Do not sit still or stand for prolonged periods.   Do not put anything directly under your knees when lying down.  Do not wear constricting clothing or garters on your upper legs.   Exercise your legs to work the fluid back into your blood vessels. This may help the swelling go down.   Wear elastic bandages or support stockings to reduce ankle swelling as directed by your health care provider.   Eat a low-salt diet to reduce fluid if your health care provider recommends it.   Only take medicines as directed by your health care provider. SEEK MEDICAL CARE IF:   Your edema is not responding to treatment.  You have heart, liver, or kidney disease and notice symptoms of edema.  You have edema in your legs that does not improve after elevating them.   You have sudden and unexplained weight gain. SEEK IMMEDIATE MEDICAL CARE IF:   You develop shortness of breath or chest  pain.   You cannot breathe when you lie down.  You develop pain, redness, or warmth in the swollen areas.   You have heart, liver, or kidney disease and suddenly get edema.  You have a fever and your symptoms suddenly get worse. MAKE SURE YOU:   Understand these instructions.  Will watch your condition.  Will get help right away if you are not doing well or get worse.   This information is not intended to replace advice given to you by your health care provider. Make sure you discuss any questions you have  with your health care provider.   Document Released: 10/06/2005 Document Revised: 10/27/2014 Document Reviewed: 07/29/2013 Elsevier Interactive Patient Education [employer]

## 2015-10-31 NOTE — ED Provider Notes (Signed)
[facility] Emergency Department Provider Note  ____________________________________________  Time seen:  Seen upon arrival to the emergency department  I have reviewed the triage vital signs and the nursing notes.   HISTORY  Chief Complaint Facial Swelling    HPI Stacey Baker is a 82 y.o. female with a history of a recent cellulitis on clindamycin who is presenting with right-sided facial swelling. She says that she woke up with the facial swelling this morning. She has had no difficulty breathing. No difficulty swallowing or drooling. She says that the right side of her face was mildly "itchy" yesterday was normal when she went to sleep last night as far as the swelling goes. She says that she does not have any pain when moving her eyes from side to side. She denies any problems with her vision.   Past Medical History  Diagnosis Date  . Stroke (HCC)   . Dementia   . Parkinson disease (HCC)   . Hypertension   . Hyperlipidemia   . Depression   . CVA (cerebral infarction)   . Encephalopathy     Patient Active Problem List   Diagnosis Date Noted  . Chronic renal insufficiency 09/15/2015  . Bradycardia 09/15/2015  . Cellulitis 09/13/2015  . Parkinson disease (HCC) 09/13/2015  . HTN (hypertension) 09/13/2015  . HLD (hyperlipidemia) 09/13/2015  . Depression 09/13/2015  . Dementia 09/13/2015  . Hyperkalemia 09/13/2015  . TIA (transient ischemic attack) 08/27/2015    Past Surgical History  Procedure Laterality Date  . Abdominal hysterectomy    . Cholecystectomy      Current Outpatient Rx  Name  Route  Sig  Dispense  Refill  . acetaminophen (TYLENOL) 325 MG tablet   Oral   Take 650 mg by mouth every 4 (four) hours as needed for mild pain.         Marland Kitchen amLODipine (NORVASC) 10 MG tablet   Oral   Take 1 tablet (10 mg total) by mouth daily.   30 tablet   5   . aspirin EC 81 MG tablet   Oral   Take 81 mg by mouth daily.         Marland Kitchen  atorvastatin (LIPITOR) 10 MG tablet   Oral   Take 10 mg by mouth at bedtime.         . carbidopa-levodopa (SINEMET IR) 25-100 MG tablet   Oral   Take 1 tablet by mouth 4 (four) times daily.         . clindamycin (CLEOCIN) 300 MG capsule   Oral   Take 1 capsule (300 mg total) by mouth 3 (three) times daily.   21 capsule   0   . diclofenac sodium (VOLTAREN) 1 % GEL   Topical   Apply 2 g topically 2 (two) times daily. Pt applies to left knee.         . donepezil (ARICEPT) 5 MG tablet   Oral   Take 5 mg by mouth at bedtime.         Marland Kitchen escitalopram (LEXAPRO) 10 MG tablet   Oral   Take 10 mg by mouth daily.         . ferrous sulfate 325 (65 FE) MG tablet   Oral   Take 325 mg by mouth 2 (two) times daily.         . fluticasone (FLONASE) 50 MCG/ACT nasal spray   Each Nare   Place 1 spray into both nostrils daily as needed for rhinitis.         Marland Kitchen  guaiFENesin (MUCINEX) 600 MG 12 hr tablet   Oral   Take 600 mg by mouth 2 (two) times daily as needed for to loosen phlegm.         . hydrOXYzine (ATARAX/VISTARIL) 25 MG tablet   Oral   Take 25 mg by mouth every 6 (six) hours as needed.         . levETIRAcetam (KEPPRA) 500 MG tablet   Oral   Take 500 mg by mouth 2 (two) times daily.         . mirtazapine (REMERON) 15 MG tablet   Oral   Take 15 mg by mouth at bedtime.         Marland Kitchen. QUEtiapine (SEROQUEL) 25 MG tablet   Oral   Take 25 mg by mouth at bedtime.         . vitamin B-12 (CYANOCOBALAMIN) 1000 MCG tablet   Oral   Take 1,000 mcg by mouth daily.           Allergies Penicillins and Plavix  Family History  Problem Relation Age of Onset  . Hypertension Other     Social History Social History  Substance Use Topics  . Smoking status: Never Smoker   . Smokeless tobacco: None  . Alcohol Use: No    Review of Systems Constitutional: No fever/chills Eyes: No visual changes. ENT: No sore throat. Cardiovascular: Denies chest  pain. Respiratory: Denies shortness of breath. Gastrointestinal: No abdominal pain.  No nausea, no vomiting.  No diarrhea.  No constipation. Genitourinary: Negative for dysuria. Musculoskeletal: Negative for back pain. Skin: Negative for rash. Neurological: Negative for headaches, focal weakness or numbness.  10-point ROS otherwise negative.  ____________________________________________   PHYSICAL EXAM:  VITAL SIGNS: ED Triage Vitals  Enc Vitals Group     BP 10/31/15 1033 158/63 mmHg     Pulse Rate 10/31/15 1027 59     Resp 10/31/15 1027 14     Temp 10/31/15 1027 98 F (36.7 C)     Temp Source 10/31/15 1027 Oral     SpO2 10/31/15 1023 97 %     Weight 10/31/15 1027 189 lb (85.73 kg)     Height 10/31/15 1027 5\' 3"  (1.6 m)     Head Cir --      Peak Flow --      Pain Score --      Pain Loc --      Pain Edu? --      Excl. in GC? --     Constitutional: Alert and oriented. Well appearing and in no acute distress. Eyes: Conjunctivae are normal. PERRL. EOMI. Head: Atraumatic. Nose: No congestion/rhinnorhea. Mouth/Throat: Mucous membranes are moist.  Oropharynx non-erythematous. Neck: No stridor.   Cardiovascular: Normal rate, regular rhythm. Grossly normal heart sounds.  Good peripheral circulation. Respiratory: Normal respiratory effort.  No retractions. Lungs CTAB. Gastrointestinal: Soft and nontender. No distention. No abdominal bruits. No CVA tenderness. Musculoskeletal: No lower extremity tenderness nor edema.  No joint effusions. Neurologic:  Normal speech and language. No gross focal neurologic deficits are appreciated. No gait instability. Skin:  Facial swelling to the right periorbital area extending back to the right ear which is edematous. It is nontender. Non-erythematous or indurated. There is no pus. Psychiatric: Mood and affect are normal. Speech and behavior are normal.  ____________________________________________   LABS (all labs ordered are listed, but  only abnormal results are displayed)  Labs Reviewed  CBC WITH DIFFERENTIAL/PLATELET - Abnormal; Notable for the following:  RBC 3.62 (*)    Hemoglobin 10.7 (*)    HCT 32.6 (*)    RDW 14.9 (*)    Platelets 135 (*)    All other components within normal limits  BASIC METABOLIC PANEL - Abnormal; Notable for the following:    Chloride 112 (*)    Glucose, Bld 109 (*)    BUN 34 (*)    Creatinine, Ser 1.61 (*)    Calcium 8.5 (*)    GFR calc non Af Amer 29 (*)    GFR calc Af Amer 33 (*)    Anion gap 3 (*)    All other components within normal limits   ____________________________________________  EKG   ____________________________________________  RADIOLOGY   ____________________________________________   PROCEDURES    ____________________________________________   INITIAL IMPRESSION / ASSESSMENT AND PLAN / ED COURSE  Pertinent labs & imaging results that were available during my care of the patient were reviewed by me and considered in my medical decision making (see chart for details).  ----------------------------------------- 2:17 PM on 10/31/2015 -----------------------------------------  Patient with mild improvement after IV medications for allergic reaction. At this point I have a lower suspicion that the patient's findings are secondary to an allergic reaction. Possible early cellulitis versus edema. Fever edema. Patient will continue her clindamycin over the next 3 days which should cover any infectious etiology. Continues to be without any airway compromise. Instructed to make sure to sit up in order for gravity to work to drain the fluid from her right face. Says always sleeps on her right side. Instructed to try sleeping on the left side to eliminate any irritation that may be caused to the right side of the face.  Discuss possible early shingles with the patient but I feel this is also less likely. Especially because there was no burning pain. There weren't  any vesicular lesions either.   ____________________________________________   FINAL CLINICAL IMPRESSION(S) / ED DIAGNOSES  Facial edema.    Myrna Blazer, MD 10/31/15 [phone]

## 2015-10-31 NOTE — ED Notes (Signed)
Pt arrived by EMS with c/o right sided facial swelling. Pt denies pain but states it started to itch yesterday. Pt was recently seen for a skin infection and is taking clindamycin. NAD at this time.

## 2015-12-31 ENCOUNTER — Emergency Department: Payer: Medicare Other

## 2015-12-31 ENCOUNTER — Inpatient Hospital Stay
Admission: EM | Admit: 2015-12-31 | Discharge: 2016-01-06 | DRG: 684 | Disposition: A | Discharge status: Home Health | Payer: Medicare Other | Attending: Internal Medicine | Admitting: Internal Medicine

## 2015-12-31 ENCOUNTER — Encounter: Payer: Self-pay | Admitting: Specialist

## 2015-12-31 DIAGNOSIS — E785 Hyperlipidemia, unspecified: Secondary | ICD-10-CM | POA: Diagnosis present

## 2015-12-31 DIAGNOSIS — R197 Diarrhea, unspecified: Secondary | ICD-10-CM | POA: Diagnosis present

## 2015-12-31 DIAGNOSIS — R509 Fever, unspecified: Secondary | ICD-10-CM | POA: Diagnosis not present

## 2015-12-31 DIAGNOSIS — Y92129 Unspecified place in nursing home as the place of occurrence of the external cause: Secondary | ICD-10-CM | POA: Diagnosis not present

## 2015-12-31 DIAGNOSIS — N189 Chronic kidney disease, unspecified: Secondary | ICD-10-CM | POA: Diagnosis present

## 2015-12-31 DIAGNOSIS — R410 Disorientation, unspecified: Secondary | ICD-10-CM

## 2015-12-31 DIAGNOSIS — Z66 Do not resuscitate: Secondary | ICD-10-CM | POA: Diagnosis present

## 2015-12-31 DIAGNOSIS — Z8673 Personal history of transient ischemic attack (TIA), and cerebral infarction without residual deficits: Secondary | ICD-10-CM

## 2015-12-31 DIAGNOSIS — Z79899 Other long term (current) drug therapy: Secondary | ICD-10-CM | POA: Diagnosis not present

## 2015-12-31 DIAGNOSIS — S0083XA Contusion of other part of head, initial encounter: Secondary | ICD-10-CM | POA: Diagnosis present

## 2015-12-31 DIAGNOSIS — R531 Weakness: Secondary | ICD-10-CM

## 2015-12-31 DIAGNOSIS — E860 Dehydration: Secondary | ICD-10-CM | POA: Diagnosis present

## 2015-12-31 DIAGNOSIS — W1839XA Other fall on same level, initial encounter: Secondary | ICD-10-CM | POA: Diagnosis present

## 2015-12-31 DIAGNOSIS — F329 Major depressive disorder, single episode, unspecified: Secondary | ICD-10-CM | POA: Diagnosis present

## 2015-12-31 DIAGNOSIS — N179 Acute kidney failure, unspecified: Principal | ICD-10-CM | POA: Diagnosis present

## 2015-12-31 DIAGNOSIS — R296 Repeated falls: Secondary | ICD-10-CM | POA: Diagnosis present

## 2015-12-31 DIAGNOSIS — Z9181 History of falling: Secondary | ICD-10-CM | POA: Diagnosis not present

## 2015-12-31 DIAGNOSIS — Z87440 Personal history of urinary (tract) infections: Secondary | ICD-10-CM

## 2015-12-31 DIAGNOSIS — Z8249 Family history of ischemic heart disease and other diseases of the circulatory system: Secondary | ICD-10-CM

## 2015-12-31 DIAGNOSIS — G20 Parkinson's disease: Secondary | ICD-10-CM | POA: Diagnosis present

## 2015-12-31 DIAGNOSIS — Z993 Dependence on wheelchair: Secondary | ICD-10-CM | POA: Diagnosis not present

## 2015-12-31 DIAGNOSIS — R4182 Altered mental status, unspecified: Secondary | ICD-10-CM | POA: Diagnosis present

## 2015-12-31 DIAGNOSIS — N289 Disorder of kidney and ureter, unspecified: Secondary | ICD-10-CM | POA: Diagnosis present

## 2015-12-31 DIAGNOSIS — Z7982 Long term (current) use of aspirin: Secondary | ICD-10-CM | POA: Diagnosis not present

## 2015-12-31 DIAGNOSIS — F0390 Unspecified dementia without behavioral disturbance: Secondary | ICD-10-CM | POA: Diagnosis present

## 2015-12-31 DIAGNOSIS — I129 Hypertensive chronic kidney disease with stage 1 through stage 4 chronic kidney disease, or unspecified chronic kidney disease: Secondary | ICD-10-CM | POA: Diagnosis present

## 2015-12-31 DIAGNOSIS — W19XXXA Unspecified fall, initial encounter: Secondary | ICD-10-CM

## 2015-12-31 LAB — CBC WITH DIFFERENTIAL/PLATELET
BASOS PCT: 1 %
Basophils Absolute: 0 10*3/uL (ref 0–0.1)
EOS ABS: 0.1 10*3/uL (ref 0–0.7)
EOS PCT: 1 %
HCT: 36.3 % (ref 35.0–47.0)
HEMOGLOBIN: 12.1 g/dL (ref 12.0–16.0)
Lymphocytes Relative: 17 %
Lymphs Abs: 1.2 10*3/uL (ref 1.0–3.6)
MCH: 30.2 pg (ref 26.0–34.0)
MCHC: 33.3 g/dL (ref 32.0–36.0)
MCV: 90.7 fL (ref 80.0–100.0)
MONOS PCT: 9 %
Monocytes Absolute: 0.6 10*3/uL (ref 0.2–0.9)
NEUTROS PCT: 72 %
Neutro Abs: 5.2 10*3/uL (ref 1.4–6.5)
PLATELETS: 120 10*3/uL — AB (ref 150–440)
RBC: 4 MIL/uL (ref 3.80–5.20)
RDW: 14.1 % (ref 11.5–14.5)
WBC: 7.1 10*3/uL (ref 3.6–11.0)

## 2015-12-31 LAB — CBC
HEMATOCRIT: 37.7 % (ref 35.0–47.0)
HEMOGLOBIN: 12.5 g/dL (ref 12.0–16.0)
MCH: 29.9 pg (ref 26.0–34.0)
MCHC: 33 g/dL (ref 32.0–36.0)
MCV: 90.6 fL (ref 80.0–100.0)
Platelets: 118 10*3/uL — ABNORMAL LOW (ref 150–440)
RBC: 4.16 MIL/uL (ref 3.80–5.20)
RDW: 13.8 % (ref 11.5–14.5)
WBC: 5.8 10*3/uL (ref 3.6–11.0)

## 2015-12-31 LAB — URINALYSIS COMPLETE WITH MICROSCOPIC (ARMC ONLY)
Bilirubin Urine: NEGATIVE
GLUCOSE, UA: NEGATIVE mg/dL
Ketones, ur: NEGATIVE mg/dL
Leukocytes, UA: NEGATIVE
Nitrite: NEGATIVE
Protein, ur: NEGATIVE mg/dL
Specific Gravity, Urine: 1.01 (ref 1.005–1.030)
pH: 5 (ref 5.0–8.0)

## 2015-12-31 LAB — COMPREHENSIVE METABOLIC PANEL
ALBUMIN: 3.8 g/dL (ref 3.5–5.0)
ALT: 14 U/L (ref 14–54)
ALT: 16 U/L (ref 14–54)
ANION GAP: 9 (ref 5–15)
AST: 41 U/L (ref 15–41)
AST: 43 U/L — ABNORMAL HIGH (ref 15–41)
Albumin: 4.1 g/dL (ref 3.5–5.0)
Alkaline Phosphatase: 73 U/L (ref 38–126)
Alkaline Phosphatase: 76 U/L (ref 38–126)
Anion gap: 9 (ref 5–15)
BUN: 48 mg/dL — ABNORMAL HIGH (ref 6–20)
BUN: 45 mg/dL — ABNORMAL HIGH (ref 6–20)
CALCIUM: 8.5 mg/dL — AB (ref 8.9–10.3)
CHLORIDE: 111 mmol/L (ref 101–111)
CHLORIDE: 111 mmol/L (ref 101–111)
CO2: 20 mmol/L — AB (ref 22–32)
CO2: 20 mmol/L — AB (ref 22–32)
CREATININE: 2.26 mg/dL — AB (ref 0.44–1.00)
Calcium: 8.8 mg/dL — ABNORMAL LOW (ref 8.9–10.3)
Creatinine, Ser: 2.41 mg/dL — ABNORMAL HIGH (ref 0.44–1.00)
GFR calc non Af Amer: 18 mL/min — ABNORMAL LOW (ref >60)
GFR calc non Af Amer: 19 mL/min — ABNORMAL LOW (ref >60)
GFR, EST AFRICAN AMERICAN: 21 mL/min — AB (ref >60)
GFR, EST AFRICAN AMERICAN: 22 mL/min — AB (ref >60)
GLUCOSE: 96 mg/dL (ref 65–99)
Glucose, Bld: 109 mg/dL — ABNORMAL HIGH (ref 65–99)
POTASSIUM: 4.4 mmol/L (ref 3.5–5.1)
POTASSIUM: 4.6 mmol/L (ref 3.5–5.1)
SODIUM: 140 mmol/L (ref 135–145)
SODIUM: 140 mmol/L (ref 135–145)
Total Bilirubin: 0.6 mg/dL (ref 0.3–1.2)
Total Bilirubin: 0.6 mg/dL (ref 0.3–1.2)
Total Protein: 6.8 g/dL (ref 6.5–8.1)
Total Protein: 7.2 g/dL (ref 6.5–8.1)

## 2015-12-31 LAB — TROPONIN I: TROPONIN I: 0.04 ng/mL — AB (ref <0.031)

## 2015-12-31 MED ORDER — VITAMIN B-12 1000 MCG PO TABS
1000.0000 ug | ORAL_TABLET | Freq: Every day | ORAL | Status: DC
  Administered 2016-01-01 – 2016-01-06 (×6): 1000 ug via ORAL
  Filled 2015-12-31 (×7): qty 1

## 2015-12-31 MED ORDER — QUETIAPINE FUMARATE 25 MG PO TABS
25.0000 mg | ORAL_TABLET | Freq: Every day | ORAL | Status: DC
  Administered 2015-12-31 – 2016-01-05 (×6): 25 mg via ORAL
  Filled 2015-12-31 (×6): qty 1

## 2015-12-31 MED ORDER — MIRTAZAPINE 15 MG PO TABS
15.0000 mg | ORAL_TABLET | Freq: Every day | ORAL | Status: DC
  Administered 2015-12-31 – 2016-01-05 (×6): 15 mg via ORAL
  Filled 2015-12-31 (×6): qty 1

## 2015-12-31 MED ORDER — ASPIRIN EC 81 MG PO TBEC
81.0000 mg | DELAYED_RELEASE_TABLET | Freq: Every day | ORAL | Status: DC
  Administered 2016-01-01 – 2016-01-06 (×5): 81 mg via ORAL
  Filled 2015-12-31 (×7): qty 1

## 2015-12-31 MED ORDER — SODIUM CHLORIDE 0.9 % IV SOLN
INTRAVENOUS | Status: DC
  Administered 2015-12-31 – 2016-01-04 (×7): via INTRAVENOUS

## 2015-12-31 MED ORDER — ACETAMINOPHEN 325 MG PO TABS
650.0000 mg | ORAL_TABLET | Freq: Four times a day (QID) | ORAL | Status: DC | PRN
  Administered 2016-01-03: 650 mg via ORAL
  Filled 2015-12-31: qty 2

## 2015-12-31 MED ORDER — SODIUM CHLORIDE 0.9 % IV BOLUS (SEPSIS)
1000.0000 mL | Freq: Once | INTRAVENOUS | Status: AC
  Administered 2015-12-31: 1000 mL via INTRAVENOUS

## 2015-12-31 MED ORDER — HEPARIN SODIUM (PORCINE) 5000 UNIT/ML IJ SOLN
5000.0000 [IU] | Freq: Three times a day (TID) | INTRAMUSCULAR | Status: DC
  Administered 2015-12-31 – 2016-01-01 (×2): 5000 [IU] via SUBCUTANEOUS
  Filled 2015-12-31 (×2): qty 1

## 2015-12-31 MED ORDER — CARBIDOPA-LEVODOPA 25-100 MG PO TABS
1.0000 | ORAL_TABLET | Freq: Three times a day (TID) | ORAL | Status: DC
  Administered 2015-12-31 – 2016-01-06 (×17): 1 via ORAL
  Filled 2015-12-31 (×18): qty 1

## 2015-12-31 MED ORDER — DONEPEZIL HCL 5 MG PO TABS
5.0000 mg | ORAL_TABLET | Freq: Every day | ORAL | Status: DC
  Administered 2015-12-31 – 2016-01-05 (×6): 5 mg via ORAL
  Filled 2015-12-31 (×6): qty 1

## 2015-12-31 MED ORDER — ONDANSETRON HCL 4 MG PO TABS: 4.0000 mg | ORAL_TABLET | Freq: Four times a day (QID) | ORAL | Status: DC | PRN

## 2015-12-31 MED ORDER — LEVETIRACETAM 500 MG PO TABS
500.0000 mg | ORAL_TABLET | Freq: Two times a day (BID) | ORAL | Status: DC
  Administered 2015-12-31 – 2016-01-06 (×12): 500 mg via ORAL
  Filled 2015-12-31 (×13): qty 1

## 2015-12-31 MED ORDER — FERROUS SULFATE 325 (65 FE) MG PO TABS
325.0000 mg | ORAL_TABLET | Freq: Two times a day (BID) | ORAL | Status: DC
  Administered 2015-12-31 – 2016-01-06 (×12): 325 mg via ORAL
  Filled 2015-12-31 (×13): qty 1

## 2015-12-31 MED ORDER — CHLORHEXIDINE GLUCONATE 0.12 % MT SOLN
15.0000 mL | Freq: Two times a day (BID) | OROMUCOSAL | Status: DC
  Administered 2015-12-31 – 2016-01-06 (×12): 15 mL via OROMUCOSAL
  Filled 2015-12-31 (×11): qty 15

## 2015-12-31 MED ORDER — AMLODIPINE BESYLATE 10 MG PO TABS
10.0000 mg | ORAL_TABLET | Freq: Every day | ORAL | Status: DC
  Administered 2016-01-01 – 2016-01-06 (×6): 10 mg via ORAL
  Filled 2015-12-31 (×7): qty 1

## 2015-12-31 MED ORDER — ACETAMINOPHEN 650 MG RE SUPP
650.0000 mg | Freq: Four times a day (QID) | RECTAL | Status: DC | PRN
Start: 2015-12-31 — End: 2016-01-06

## 2015-12-31 MED ORDER — ATORVASTATIN CALCIUM 10 MG PO TABS
10.0000 mg | ORAL_TABLET | Freq: Every day | ORAL | Status: DC
  Administered 2015-12-31 – 2016-01-05 (×6): 10 mg via ORAL
  Filled 2015-12-31 (×6): qty 1

## 2015-12-31 MED ORDER — ONDANSETRON HCL 4 MG/2ML IJ SOLN: 4.0000 mg | Freq: Four times a day (QID) | INTRAMUSCULAR | Status: DC | PRN

## 2015-12-31 MED ORDER — CETYLPYRIDINIUM CHLORIDE 0.05 % MT LIQD
7.0000 mL | Freq: Two times a day (BID) | OROMUCOSAL | Status: DC
  Administered 2016-01-01 – 2016-01-04 (×6): 7 mL via OROMUCOSAL

## 2015-12-31 MED ORDER — ESCITALOPRAM OXALATE 10 MG PO TABS
10.0000 mg | ORAL_TABLET | Freq: Every day | ORAL | Status: DC
  Administered 2016-01-01 – 2016-01-06 (×6): 10 mg via ORAL
  Filled 2015-12-31 (×7): qty 1

## 2015-12-31 NOTE — ED Provider Notes (Signed)
[facility] Emergency Department Provider Note  Time seen: 12:04 PM  I have reviewed the triage vital signs and the nursing notes.   HISTORY  Chief Complaint Weakness    HPI Stacey Baker is a 82 y.o. female with a past medical history of dementia, Parkinson's, CVA, hypertension, hyperlipidemia who presents the emergency department after a fall with generalized weakness. According to the patient and report for the past one week she has been having intermittent diarrhea, denies any currently. She states just generalized weakness, today her legs gave out and she fell hitting the left side of her head. Denies loss of consciousness. Denies vomiting. States she was having dysuria several days ago but that has resolved. Denies any chest pain, trouble breathing, abdominal pain. Does state mild headache. Denies any neck pain.     Past Medical History  Diagnosis Date  . Stroke (HCC)   . Dementia   . Parkinson disease (HCC)   . Hypertension   . Hyperlipidemia   . Depression   . CVA (cerebral infarction)   . Encephalopathy     Patient Active Problem List   Diagnosis Date Noted  . Chronic renal insufficiency 09/15/2015  . Bradycardia 09/15/2015  . Cellulitis 09/13/2015  . Parkinson disease (HCC) 09/13/2015  . HTN (hypertension) 09/13/2015  . HLD (hyperlipidemia) 09/13/2015  . Depression 09/13/2015  . Dementia 09/13/2015  . Hyperkalemia 09/13/2015  . TIA (transient ischemic attack) 08/27/2015    Past Surgical History  Procedure Laterality Date  . Abdominal hysterectomy    . Cholecystectomy      Current Outpatient Rx  Name  Route  Sig  Dispense  Refill  . acetaminophen (TYLENOL) 325 MG tablet   Oral   Take 650 mg by mouth every 4 (four) hours as needed for mild pain.         Marland Kitchen amLODipine (NORVASC) 10 MG tablet   Oral   Take 1 tablet (10 mg total) by mouth daily.   30 tablet   5   . aspirin EC 81 MG tablet   Oral   Take 81 mg by mouth  daily.         Marland Kitchen atorvastatin (LIPITOR) 10 MG tablet   Oral   Take 10 mg by mouth at bedtime.         . carbidopa-levodopa (SINEMET IR) 25-100 MG tablet   Oral   Take 1 tablet by mouth 4 (four) times daily.         . clindamycin (CLEOCIN) 300 MG capsule   Oral   Take 1 capsule (300 mg total) by mouth 3 (three) times daily.   21 capsule   0   . diclofenac sodium (VOLTAREN) 1 % GEL   Topical   Apply 2 g topically 2 (two) times daily. Pt applies to left knee.         . donepezil (ARICEPT) 5 MG tablet   Oral   Take 5 mg by mouth at bedtime.         Marland Kitchen escitalopram (LEXAPRO) 10 MG tablet   Oral   Take 10 mg by mouth daily.         . ferrous sulfate 325 (65 FE) MG tablet   Oral   Take 325 mg by mouth 2 (two) times daily.         . fluticasone (FLONASE) 50 MCG/ACT nasal spray   Each Nare   Place 1 spray into both nostrils daily as needed for rhinitis.         Marland Kitchen  guaiFENesin (MUCINEX) 600 MG 12 hr tablet   Oral   Take 600 mg by mouth 2 (two) times daily as needed for to loosen phlegm.         . hydrOXYzine (ATARAX/VISTARIL) 25 MG tablet   Oral   Take 25 mg by mouth every 6 (six) hours as needed.         . levETIRAcetam (KEPPRA) 500 MG tablet   Oral   Take 500 mg by mouth 2 (two) times daily.         . mirtazapine (REMERON) 15 MG tablet   Oral   Take 15 mg by mouth at bedtime.         Marland Kitchen QUEtiapine (SEROQUEL) 25 MG tablet   Oral   Take 25 mg by mouth at bedtime.         . vitamin B-12 (CYANOCOBALAMIN) 1000 MCG tablet   Oral   Take 1,000 mcg by mouth daily.           Allergies Penicillins and Plavix  Family History  Problem Relation Age of Onset  . Hypertension Other     Social History Social History  Substance Use Topics  . Smoking status: Never Smoker   . Smokeless tobacco: Not on file  . Alcohol Use: No    Review of Systems Constitutional: Negative for fever.Positive for generalized weakness Cardiovascular: Negative for  chest pain. Respiratory: Negative for shortness of breath. Gastrointestinal: Negative for abdominal pain Genitourinary: Dysuria several days ago which has resolved Musculoskeletal: Negative for back pain.Negative for neck pain. Neurological: Mild headache. Denies focal weakness or numbness 10-point ROS otherwise negative.  ____________________________________________   PHYSICAL EXAM:  VITAL SIGNS: ED Triage Vitals  Enc Vitals Group     BP 12/31/15 1148 131/57 mmHg     Pulse Rate 12/31/15 1148 79     Resp 12/31/15 1148 16     Temp 12/31/15 1148 97.8 F (36.6 C)     Temp Source 12/31/15 1148 Oral     SpO2 12/31/15 1148 100 %     Weight 12/31/15 1148 160 lb 15 oz (73 kg)     Height --      Head Cir --      Peak Flow --      Pain Score 12/31/15 1149 8     Pain Loc --      Pain Edu? --      Excl. in GC? --     Constitutional: Alert and oriented. Appears weak and fatigued, but no active distress. Eyes: Normal exam ENT   Head: Mild hematoma to left hand.   Nose: No congestion/rhinnorhea.   Mouth/Throat: Mucous membranes are moist. Cardiovascular: Normal rate, regular rhythm. No murmur Respiratory: Normal respiratory effort without tachypnea nor retractions. Breath sounds are clear Gastrointestinal: Soft and nontender. No distention.   Musculoskeletal: Nontender with normal range of motion in all extremities. Neurologic:  Normal speech and language.  Skin:  Skin is warm, dry and intact.  Psychiatric: Mood and affect are normal. Speech and behavior are normal.   ____________________________________________    EKG  EKG reviewed and interpreted by myself shows what appears to be a junctional rhythm versus sinus rhythm around 67 bpm, narrow QRS, normal axis, normal intervals, nonspecific ST changes. No obvious ST elevations.  ____________________________________________    RADIOLOGY  CT head and C-spine  negative.  ____________________________________________   INITIAL IMPRESSION / ASSESSMENT AND PLAN / ED COURSE  Pertinent labs & imaging results that were available during  my care of the patient were reviewed by me and considered in my medical decision making (see chart for details).  Patient status post fall with generalized weakness. We will check labs, IV hydrate, check urinalysis, obtained a CT head C-spine due to the fall.  CTs are negative. Currently awaiting lab results, they have now hemolyzed twice. We will reset and labs.  Labs have resulted showing renal insufficiency with a creatinine of 2.4 likely due to the patient's recent diarrheal illness. No more diarrhea today per patient. Given her generalized weakness leading to a fall today and acute renal insufficiency we will admit the patient for further treatment. Patient agreeable to plan.  ____________________________________________   FINAL CLINICAL IMPRESSION(S) / ED DIAGNOSES  Fall Generalized weakness Renal insufficiency  Minna AntisKevin Delora Gravatt, MD 12/31/15 1550

## 2015-12-31 NOTE — ED Notes (Signed)
Pt here from [facility], reports weakness today, fall earlier with hematoma to left side of head. Pt also reports diarrhea and weakness for the past week; pt conscious and alert upon arrival, Pt alert and oriented upon arrival.

## 2015-12-31 NOTE — H&P (Signed)
[facility] [place] [facility]   PATIENT NAME: Stacey Baker    MR#:  [id number]  DATE OF BIRTH:  09/10/1932  DATE OF ADMISSION:  12/31/2015  PRIMARY CARE PHYSICIAN: Merlene Pulling, PA-C   REQUESTING/REFERRING PHYSICIAN: Dr. Minna Antis  CHIEF COMPLAINT:   Chief Complaint  Patient presents with  . Weakness    HISTORY OF PRESENT ILLNESS:  Stacey Baker  is a 82 y.o. female with a known history of dementia, previous CVA, Parkinson's disease, hypertension, hyperlipidemia, depression, history of previous UTIs who presents to the hospital due to generalized weakness frequent falls and noted to be in acute renal failure. Patient says that she's been feeling increasingly weak over the past few days and has had frequent falls at her assisted living. She denies having any head trauma or loss of consciousness. She says she's been having some diarrhea over the past few days which has been associated with some nausea and vomiting. She also admits to poor by mouth intake over the past few days. Patient presented to the hospital was noted to be in acute renal failure with a creatinine of 2.4. Hospitalist services were contacted further treatment and evaluation.  PAST MEDICAL HISTORY:   Past Medical History  Diagnosis Date  . Stroke (HCC)   . Dementia   . Parkinson disease (HCC)   . Hypertension   . Hyperlipidemia   . Depression   . CVA (cerebral infarction)   . Encephalopathy     PAST SURGICAL HISTORY:   Past Surgical History  Procedure Laterality Date  . Abdominal hysterectomy    . Cholecystectomy      SOCIAL HISTORY:   Social History  Substance Use Topics  . Smoking status: Never Smoker   . Smokeless tobacco: Not on file  . Alcohol Use: No    FAMILY HISTORY:   Family History  Problem Relation Age of Onset  . Hypertension Other   . Heart disease Mother     DRUG ALLERGIES:   Allergies  Allergen Reactions  . Penicillins Other  (See Comments)    Reaction:  Unknown   . Plavix [Clopidogrel Bisulfate] Other (See Comments)    Reaction:  Unknown     REVIEW OF SYSTEMS:   Review of Systems  Constitutional: Negative for fever and weight loss.  HENT: Negative for congestion, nosebleeds and tinnitus.   Eyes: Negative for blurred vision, double vision and redness.  Respiratory: Negative for cough, hemoptysis and shortness of breath.   Cardiovascular: Negative for chest pain, orthopnea, leg swelling and PND.  Gastrointestinal: Positive for nausea, vomiting and diarrhea. Negative for abdominal pain and melena.  Genitourinary: Negative for dysuria, urgency and hematuria.  Musculoskeletal: Positive for falls. Negative for joint pain.  Neurological: Positive for weakness. Negative for dizziness, tingling, sensory change, focal weakness, seizures and headaches.  Endo/Heme/Allergies: Negative for polydipsia. Does not bruise/bleed easily.  Psychiatric/Behavioral: Negative for depression and memory loss. The patient is not nervous/anxious.     MEDICATIONS AT HOME:   Prior to Admission medications   Medication Sig Start Date End Date Taking? Authorizing Provider  amLODipine (NORVASC) 10 MG tablet Take 1 tablet (10 mg total) by mouth daily. 09/15/15  Yes Katharina Caper, MD  aspirin EC 81 MG tablet Take 81 mg by mouth daily.   Yes Historical Provider, MD  atorvastatin (LIPITOR) 10 MG tablet Take 10 mg by mouth at bedtime.   Yes Historical Provider, MD  carbidopa-levodopa (SINEMET IR) 25-100 MG tablet Take 1 tablet  by mouth 3 (three) times daily.   Yes Historical Provider, MD  diclofenac sodium (VOLTAREN) 1 % GEL Apply 2 g topically 2 (two) times daily. Pt applies to left knee.   Yes Historical Provider, MD  donepezil (ARICEPT) 5 MG tablet Take 5 mg by mouth at bedtime.   Yes Historical Provider, MD  escitalopram (LEXAPRO) 10 MG tablet Take 10 mg by mouth daily.   Yes Historical Provider, MD  ferrous sulfate 325 (65 FE) MG tablet  Take 325 mg by mouth 2 (two) times daily.   Yes Historical Provider, MD  furosemide (LASIX) 20 MG tablet Take 20 mg by mouth daily.   Yes Historical Provider, MD  levETIRAcetam (KEPPRA) 500 MG tablet Take 500 mg by mouth 2 (two) times daily.   Yes Historical Provider, MD  mirtazapine (REMERON) 15 MG tablet Take 15 mg by mouth at bedtime.   Yes Historical Provider, MD  potassium chloride (K-DUR,KLOR-CON) 10 MEQ tablet Take 10 mEq by mouth daily.   Yes Historical Provider, MD  QUEtiapine (SEROQUEL) 25 MG tablet Take 25 mg by mouth at bedtime.   Yes Historical Provider, MD  vitamin B-12 (CYANOCOBALAMIN) 1000 MCG tablet Take 1,000 mcg by mouth daily.   Yes Historical Provider, MD  clindamycin (CLEOCIN) 300 MG capsule Take 1 capsule (300 mg total) by mouth 3 (three) times daily. Patient not taking: Reported on 12/31/2015 09/15/15   Katharina Caperima Vaickute, MD      VITAL SIGNS:  Blood pressure 139/70, pulse 79, temperature 97.8 F (36.6 C), temperature source Oral, resp. rate 12, weight 73 kg (160 lb 15 oz), SpO2 96 %.  PHYSICAL EXAMINATION:  Physical Exam  GENERAL:  82 y.o.-year-old cachectic patient lying in the bed in no acute distress.  EYES: Pupils equal, round, reactive to light and accommodation. No scleral icterus. Extraocular muscles intact.  HEENT: Head atraumatic, normocephalic. Oropharynx and nasopharynx clear. No oropharyngeal erythema, dry oral mucosa  NECK:  Supple, no jugular venous distention. No thyroid enlargement, no tenderness.  LUNGS: Normal breath sounds bilaterally, no wheezing, rales, rhonchi. No use of accessory muscles of respiration.  CARDIOVASCULAR: S1, S2 RRR. No murmurs, rubs, gallops, clicks.  ABDOMEN: Soft, nontender, nondistended. Bowel sounds present. No organomegaly or mass.  EXTREMITIES: No pedal edema, cyanosis, or clubbing. + 2 pedal & radial pulses b/l.   NEUROLOGIC: Cranial nerves II through XII are intact. No focal Motor or sensory deficits appreciated b/l.   Globally weak.   PSYCHIATRIC: The patient is alert and oriented x 3.  SKIN: No obvious rash, lesion, or ulcer.   LABORATORY PANEL:   CBC  Recent Labs Lab 12/31/15 1249  WBC 7.1  HGB 12.1  HCT 36.3  PLT 120*   ------------------------------------------------------------------------------------------------------------------  Chemistries   Recent Labs Lab 12/31/15 1424  NA 140  K 4.4  CL 111  CO2 20*  GLUCOSE 96  BUN 48*  CREATININE 2.41*  CALCIUM 8.5*  AST 41  ALT 14  ALKPHOS 73  BILITOT 0.6   ------------------------------------------------------------------------------------------------------------------  Cardiac Enzymes  Recent Labs Lab 12/31/15 1424  TROPONINI 0.04*   ------------------------------------------------------------------------------------------------------------------  RADIOLOGY:  Ct Head Wo Contrast  12/31/2015  CLINICAL DATA:  Larey SeatFell today and hit head.  Left-sided hematoma. EXAM: CT HEAD WITHOUT CONTRAST CT CERVICAL SPINE WITHOUT CONTRAST TECHNIQUE: Multidetector CT imaging of the head and cervical spine was performed following the standard protocol without intravenous contrast. Multiplanar CT image reconstructions of the cervical spine were also generated. COMPARISON:  Multiple prior examinations. The most recent is 08/27/2015  FINDINGS: CT HEAD FINDINGS Stable age related cerebral atrophy, ventriculomegaly and periventricular white matter disease. No extra-axial fluid collections are identified. No CT findings for acute hemispheric infarction or intracranial hemorrhage. No mass lesions. The brainstem and cerebellum are normal. No acute skull fracture is identified. The paranasal sinuses and mastoid air cells are clear. The globes are intact. The visualized facial bones are intact. CT CERVICAL SPINE FINDINGS Degenerative cervical spondylosis with multilevel disc disease and facet disease. No acute cervical spine fracture. No abnormal prevertebral soft  tissue swelling. The facets are normally aligned. Facet fusions are noted. No facet or lamina fractures are identified. Stable right-sided thyroid goiter since 2015. The lung apices are grossly clear. Emphysematous changes are noted. IMPRESSION: 1. Stable age related cerebral atrophy, ventriculomegaly and periventricular white matter disease. 2. No acute intracranial findings or acute skull fracture. 3. Degenerative cervical spondylosis with multilevel disc disease and facet disease but no acute cervical spine fracture. Electronically Signed   By: Rudie Meyer M.D.   On: 12/31/2015 12:50   Ct Cervical Spine Wo Contrast  12/31/2015  CLINICAL DATA:  Larey Seat today and hit head.  Left-sided hematoma. EXAM: CT HEAD WITHOUT CONTRAST CT CERVICAL SPINE WITHOUT CONTRAST TECHNIQUE: Multidetector CT imaging of the head and cervical spine was performed following the standard protocol without intravenous contrast. Multiplanar CT image reconstructions of the cervical spine were also generated. COMPARISON:  Multiple prior examinations. The most recent is 08/27/2015 FINDINGS: CT HEAD FINDINGS Stable age related cerebral atrophy, ventriculomegaly and periventricular white matter disease. No extra-axial fluid collections are identified. No CT findings for acute hemispheric infarction or intracranial hemorrhage. No mass lesions. The brainstem and cerebellum are normal. No acute skull fracture is identified. The paranasal sinuses and mastoid air cells are clear. The globes are intact. The visualized facial bones are intact. CT CERVICAL SPINE FINDINGS Degenerative cervical spondylosis with multilevel disc disease and facet disease. No acute cervical spine fracture. No abnormal prevertebral soft tissue swelling. The facets are normally aligned. Facet fusions are noted. No facet or lamina fractures are identified. Stable right-sided thyroid goiter since 2015. The lung apices are grossly clear. Emphysematous changes are noted. IMPRESSION:  1. Stable age related cerebral atrophy, ventriculomegaly and periventricular white matter disease. 2. No acute intracranial findings or acute skull fracture. 3. Degenerative cervical spondylosis with multilevel disc disease and facet disease but no acute cervical spine fracture. Electronically Signed   By: Rudie Meyer M.D.   On: 12/31/2015 12:50     IMPRESSION AND PLAN:   82 year old female with past medical history of dementia, Parkinson's disease, hypertension, hyperlipidemia, depression, history of previous CVA who presents to the hospital due to a fall, generalized weakness, diarrhea and noted to be in acute renal failure.  #1 acute renal failure-due to dehydration and ongoing diarrhea. -I will hydrate the patient with IV fluids, follow BUN and creatinine. Hold Lasix.  #2 acute diarrhea-etiology unclear. I will place on clear liquid diet, -Check gastrointestinal PCR for pathogenic organisms.  #3 generalized weakness/falls-due to #1 and #2 and also underlying deconditioning. -I will get a physical therapy evaluation to assess mobility.  #4 history of Parkinson's disease-continue Sinemet.  #5 depression-continue Remeron.  #6 dementia-continue Aricept, Seroquel.   All the records are reviewed and case discussed with ED provider. Management plans discussed with the patient, family and they are in agreement.  CODE STATUS: Full   TOTAL TIME TAKING CARE OF THIS PATIENT: 45 minutes.    [name] M.D on 12/31/2015 at 4:06 PM  Between 7am to 6pm - Pager - [phone]  After 6pm go to [email or web address] - password [email or web address] [facility]  [facility]  Office  [phone]  CC: Primary care physician; [name] BETH, PA-C

## 2016-01-01 LAB — CBC
HEMATOCRIT: 34 % — AB (ref 35.0–47.0)
HEMOGLOBIN: 11.4 g/dL — AB (ref 12.0–16.0)
MCH: 30.8 pg (ref 26.0–34.0)
MCHC: 33.4 g/dL (ref 32.0–36.0)
MCV: 92 fL (ref 80.0–100.0)
Platelets: 94 10*3/uL — ABNORMAL LOW (ref 150–440)
RBC: 3.7 MIL/uL — AB (ref 3.80–5.20)
RDW: 13.7 % (ref 11.5–14.5)
WBC: 4 10*3/uL (ref 3.6–11.0)

## 2016-01-01 LAB — BASIC METABOLIC PANEL
ANION GAP: 3 — AB (ref 5–15)
BUN: 39 mg/dL — ABNORMAL HIGH (ref 6–20)
CHLORIDE: 115 mmol/L — AB (ref 101–111)
CO2: 22 mmol/L (ref 22–32)
Calcium: 8.1 mg/dL — ABNORMAL LOW (ref 8.9–10.3)
Creatinine, Ser: 1.88 mg/dL — ABNORMAL HIGH (ref 0.44–1.00)
GFR calc non Af Amer: 24 mL/min — ABNORMAL LOW (ref >60)
GFR, EST AFRICAN AMERICAN: 28 mL/min — AB (ref >60)
Glucose, Bld: 90 mg/dL (ref 65–99)
POTASSIUM: 4.2 mmol/L (ref 3.5–5.1)
Sodium: 140 mmol/L (ref 135–145)

## 2016-01-01 NOTE — Progress Notes (Signed)
[facility] [place] at [facility]                                                                                                                                                                                            Patient Demographics   Stacey ManchesterMiriam Baker, is a 82 y.o. female, DOB - 06/01/1932, [id number]  Admit date - 12/31/2015   Admitting Physician Houston SirenVivek J Sainani, MD  Outpatient Primary MD for the patient is [facility], MARY BETH, PA-C   LOS - 1  Subjective: Patient continues to be very weak and tired. States diarrhea is resolved     Review of Systems:   CONSTITUTIONAL: No documented fever. Positive fatigue and weakness. No weight gain, no weight loss.  EYES: No blurry or double vision.  ENT: No tinnitus. No postnasal drip. No redness of the oropharynx.  RESPIRATORY: No cough, no wheeze, no hemoptysis. No dyspnea.  CARDIOVASCULAR: No chest pain. No orthopnea. No palpitations. No syncope.  GASTROINTESTINAL: No nausea, no vomiting or diarrhea. No abdominal pain. No melena or hematochezia.  GENITOURINARY: No dysuria or hematuria.  ENDOCRINE: No polyuria or nocturia. No heat or cold intolerance.  HEMATOLOGY: No anemia. No bruising. No bleeding.  INTEGUMENTARY: No rashes. No lesions.  MUSCULOSKELETAL: No arthritis. No swelling. No gout.  NEUROLOGIC: No numbness, tingling, or ataxia. No seizure-type activity.  PSYCHIATRIC: No anxiety. No insomnia. No ADD.    Vitals:   Filed Vitals:   12/31/15 2334 01/01/16 0429 01/01/16 0800 01/01/16 1142  BP: 150/64 130/54 142/64 138/60  Pulse: 63 60 62 54  Temp: 98.3 F (36.8 C) 97.3 F (36.3 C) 98.4 F (36.9 C) 98.6 F (37 C)  TempSrc: Axillary Axillary Axillary Axillary  Resp: 18 17 16 16   Height:      Weight:      SpO2: 95% 97% 94% 94%    Wt Readings from Last 3 Encounters:  12/31/15 66.225 kg (146 lb)  10/31/15 85.73 kg (189 lb)  09/13/15 65.318 kg (144 lb)     Intake/Output Summary (Last 24  hours) at 01/01/16 1226 Last data filed at 01/01/16 0940  Gross per 24 hour  Intake 958.33 ml  Output      0 ml  Net 958.33 ml    Physical Exam:   GENERAL: Pleasant-appearing in no apparent distress.  HEAD, EYES, EARS, NOSE AND THROAT: Atraumatic, normocephalic. Extraocular muscles are intact. Pupils equal and reactive to light. Sclerae anicteric. No conjunctival injection. No oro-pharyngeal erythema.  NECK: Supple. There is no jugular venous distention. No bruits, no lymphadenopathy, no thyromegaly.  HEART: Regular rate and rhythm,. No murmurs, no  rubs, no clicks.  LUNGS: Clear to auscultation bilaterally. No rales or rhonchi. No wheezes.  ABDOMEN: Soft, flat, nontender, nondistended. Has good bowel sounds. No hepatosplenomegaly appreciated.  EXTREMITIES: No evidence of any cyanosis, clubbing, or peripheral edema.  +2 pedal and radial pulses bilaterally.  NEUROLOGIC: The patient is alert, awake, and oriented x3 with no focal motor or sensory deficits appreciated bilaterally.  SKIN: Moist and warm with no rashes appreciated.  Psych: Not anxious, depressed LN: No inguinal LN enlargement    Antibiotics   Anti-infectives    None      Medications   Scheduled Meds: . amLODipine  10 mg Oral Daily  . antiseptic oral rinse  7 mL Mouth Rinse q12n4p  . aspirin EC  81 mg Oral Daily  . atorvastatin  10 mg Oral QHS  . carbidopa-levodopa  1 tablet Oral TID  . chlorhexidine  15 mL Mouth Rinse BID  . donepezil  5 mg Oral QHS  . escitalopram  10 mg Oral Daily  . ferrous sulfate  325 mg Oral BID  . levETIRAcetam  500 mg Oral BID  . mirtazapine  15 mg Oral QHS  . QUEtiapine  25 mg Oral QHS  . vitamin B-12  1,000 mcg Oral Daily   Continuous Infusions: . sodium chloride 100 mL/hr at 01/01/16 0308   PRN Meds:.acetaminophen **OR** acetaminophen, ondansetron **OR** ondansetron (ZOFRAN) IV   Data Review:   Micro Results No results found for this or any previous visit (from the past 240  hour(s)).  Radiology Reports Ct Head Wo Contrast  12/31/2015  CLINICAL DATA:  Larey Seat today and hit head.  Left-sided hematoma. EXAM: CT HEAD WITHOUT CONTRAST CT CERVICAL SPINE WITHOUT CONTRAST TECHNIQUE: Multidetector CT imaging of the head and cervical spine was performed following the standard protocol without intravenous contrast. Multiplanar CT image reconstructions of the cervical spine were also generated. COMPARISON:  Multiple prior examinations. The most recent is 08/27/2015 FINDINGS: CT HEAD FINDINGS Stable age related cerebral atrophy, ventriculomegaly and periventricular white matter disease. No extra-axial fluid collections are identified. No CT findings for acute hemispheric infarction or intracranial hemorrhage. No mass lesions. The brainstem and cerebellum are normal. No acute skull fracture is identified. The paranasal sinuses and mastoid air cells are clear. The globes are intact. The visualized facial bones are intact. CT CERVICAL SPINE FINDINGS Degenerative cervical spondylosis with multilevel disc disease and facet disease. No acute cervical spine fracture. No abnormal prevertebral soft tissue swelling. The facets are normally aligned. Facet fusions are noted. No facet or lamina fractures are identified. Stable right-sided thyroid goiter since 2015. The lung apices are grossly clear. Emphysematous changes are noted. IMPRESSION: 1. Stable age related cerebral atrophy, ventriculomegaly and periventricular white matter disease. 2. No acute intracranial findings or acute skull fracture. 3. Degenerative cervical spondylosis with multilevel disc disease and facet disease but no acute cervical spine fracture. Electronically Signed   By: Rudie Meyer M.D.   On: 12/31/2015 12:50   Ct Cervical Spine Wo Contrast  12/31/2015  CLINICAL DATA:  Larey Seat today and hit head.  Left-sided hematoma. EXAM: CT HEAD WITHOUT CONTRAST CT CERVICAL SPINE WITHOUT CONTRAST TECHNIQUE: Multidetector CT imaging of the head  and cervical spine was performed following the standard protocol without intravenous contrast. Multiplanar CT image reconstructions of the cervical spine were also generated. COMPARISON:  Multiple prior examinations. The most recent is 08/27/2015 FINDINGS: CT HEAD FINDINGS Stable age related cerebral atrophy, ventriculomegaly and periventricular white matter disease. No extra-axial fluid collections are identified.  No CT findings for acute hemispheric infarction or intracranial hemorrhage. No mass lesions. The brainstem and cerebellum are normal. No acute skull fracture is identified. The paranasal sinuses and mastoid air cells are clear. The globes are intact. The visualized facial bones are intact. CT CERVICAL SPINE FINDINGS Degenerative cervical spondylosis with multilevel disc disease and facet disease. No acute cervical spine fracture. No abnormal prevertebral soft tissue swelling. The facets are normally aligned. Facet fusions are noted. No facet or lamina fractures are identified. Stable right-sided thyroid goiter since 2015. The lung apices are grossly clear. Emphysematous changes are noted. IMPRESSION: 1. Stable age related cerebral atrophy, ventriculomegaly and periventricular white matter disease. 2. No acute intracranial findings or acute skull fracture. 3. Degenerative cervical spondylosis with multilevel disc disease and facet disease but no acute cervical spine fracture. Electronically Signed   By: Rudie Meyer M.D.   On: 12/31/2015 12:50     CBC  Recent Labs Lab 12/31/15 1249 12/31/15 1757 01/01/16 0407  WBC 7.1 5.8 4.0  HGB 12.1 12.5 11.4*  HCT 36.3 37.7 34.0*  PLT 120* 118* 94*  MCV 90.7 90.6 92.0  MCH 30.2 29.9 30.8  MCHC 33.3 33.0 33.4  RDW 14.1 13.8 13.7  LYMPHSABS 1.2  --   --   MONOABS 0.6  --   --   EOSABS 0.1  --   --   BASOSABS 0.0  --   --     Chemistries   Recent Labs Lab 12/31/15 1424 12/31/15 1757 01/01/16 0407  NA 140 140 140  K 4.4 4.6 4.2  CL 111 111  115*  CO2 20* 20* 22  GLUCOSE 96 109* 90  BUN 48* 45* 39*  CREATININE 2.41* 2.26* 1.88*  CALCIUM 8.5* 8.8* 8.1*  AST 41 43*  --   ALT 14 16  --   ALKPHOS 73 76  --   BILITOT 0.6 0.6  --    ------------------------------------------------------------------------------------------------------------------ estimated creatinine clearance is 21.5 mL/min (by C-G formula based on Cr of 1.88). ------------------------------------------------------------------------------------------------------------------ No results for input(s): HGBA1C in the last 72 hours. ------------------------------------------------------------------------------------------------------------------ No results for input(s): CHOL, HDL, LDLCALC, TRIG, CHOLHDL, LDLDIRECT in the last 72 hours. ------------------------------------------------------------------------------------------------------------------ No results for input(s): TSH, T4TOTAL, T3FREE, THYROIDAB in the last 72 hours.  Invalid input(s): FREET3 ------------------------------------------------------------------------------------------------------------------ No results for input(s): VITAMINB12, FOLATE, FERRITIN, TIBC, IRON, RETICCTPCT in the last 72 hours.  Coagulation profile No results for input(s): INR, PROTIME in the last 168 hours.  No results for input(s): DDIMER in the last 72 hours.  Cardiac Enzymes  Recent Labs Lab 12/31/15 1424  TROPONINI 0.04*   ------------------------------------------------------------------------------------------------------------------ Invalid input(s): POCBNP    Assessment & Plan   82 year old female with multiple medical problems who is presenting with generalized weakness and acute renal failure  #1 acute renal failure-due to dehydration and ongoing diarrhea. Continue supportive care with IV hydration repeat renal function the morning  #2 acute diarrhea- resolved d/c isolation  #3 generalized  weakness/falls-due to #1 and #2 and also underlying deconditioning. Pt eval and treat  #4 history of Parkinson's disease-continue Sinemet.  #5 depression-continue Remeron.  #6 dementia-continue Aricept, Seroquel.      Code Status Orders        Start     Ordered   12/31/15 1742  Full code   Continuous     12/31/15 1742    Code Status History    Date Active Date Inactive Code Status Order ID Comments User Context   09/13/2015 10:55 PM 09/15/2015  8:25  PM Full Code [id number]  Oralia Manis, MD Inpatient   08/28/2015  8:14 AM 08/29/2015  4:49 PM DNR [id number]  Delfino Lovett, MD Inpatient   08/27/2015 11:25 PM 08/28/2015  8:14 AM Full Code [id number]  Wyatt Haste, MD ED           Consults   none  DVT Prophylaxis due to platelets dropping we'll stop her DVT prophylaxis with heparin  Lab Results  Component Value Date   PLT 94* 01/01/2016     Time Spent in minutes   Auburn Bilberry M.D on 01/01/2016 at 12:26 PM  Between 7am to 6pm - Pager - [phone]  After 6pm go to [email or web address] - password [email or web address] [facility]  [facility]   Office  [phone]

## 2016-01-01 NOTE — Progress Notes (Addendum)
[facility] patient is being [facility]. Clinical Child psychotherapistocial Worker (CSW) attempted to contact patient's son for the second time this afternoon with no answer. A voicemail was left.   Jetta LoutBailey Morgan, LCSW [phone]

## 2016-01-01 NOTE — Clinical Social Work Note (Signed)
Clinical Social Work Assessment  Patient Details  Name: Stacey ManchesterMiriam Baker MRN: [id number] Date of Birth: 07/21/1932  Date of referral:  01/01/16               Reason for consult:  Other (Comment Required) (From Stacey Baker Baker )                Permission sought to share information with:  Oceanographeracility Contact Representative Permission granted to share information::  Yes, Verbal Permission Granted  Name::      Stacey Baker Baker   Agency::     Relationship::     Contact Information:     Housing/Transportation Living arrangements for the past 2 months:  Assisted Living Facility Source of Information:  Facility Patient Interpreter Needed:  None Criminal Activity/Legal Involvement Pertinent to Current Situation/Hospitalization:  No - Comment as needed Significant Relationships:  Adult Children Lives with:  Facility Resident Do you feel safe going back to the place where you live?    Need for family participation in patient care:  Yes (Comment)  Care giving concerns:  [facility] is a [facility] (fax: [phone])   Social Worker assessment / plan: Clinical Social Worker (CSW) reviewed chart and noted that patient is from [facility]. CSW attempted to meet with patient however per RN she is asleep this morning. CSW contacted Administrator, Civil ServiceLaura Director at [facility] to get more information. Per Stacey RiegerLaura patient [facility]. Per Stacey RiegerLaura patient is private pay and is wheel chair bound. Per Stacey RiegerLaura patient is not on oxygen and has had c-diff in the past. Per Stacey RiegerLaura patient is followed by palliative and hospice at some point and her son Stacey Baker is the main contact. Per Stacey RiegerLaura patient can [facility].  CSW left patient's son Stacey Baker a Engineer, technical salesvoicemail. CSW will continue to follow and assist as needed.   Employment status:  Disabled (Comment on whether or not currently receiving Disability), Retired Health and safety inspectornsurance information:  Medicare PT Recommendations:  Not assessed at this time Information  / Referral to community resources:  Other (Comment Required) (Baker )  Patient/Family's Response to care: CSW is waiting on return call from patient's son Stacey Baker.   Patient/Family's Understanding of and Emotional Response to Diagnosis, Current Treatment, and Prognosis: Patient was asleep when CSW attempted to meet with her this morning.   Emotional Assessment Appearance:  Appears stated age Attitude/Demeanor/Rapport:  Unable to Assess Affect (typically observed):  Unable to Assess Orientation:  Oriented to Self, Fluctuating Orientation (Suspected and/or reported Sundowners) Alcohol / Substance use:  Not Applicable Psych involvement (Current and /or in the community):  No (Comment)  Discharge Needs  Concerns to be addressed:  Discharge Planning Concerns Readmission within the last 30 days:  No Current discharge risk:  Chronically ill, Cognitively Impaired, Dependent with Mobility Barriers to Discharge:  Continued Medical Work up   Stacey Baker, Stacey Pellegrin G, LCSW 01/01/2016, 10:56 AM

## 2016-01-01 NOTE — [place] FL2 (Signed)
[place] MEDICAID FL2 LEVEL OF CARE SCREENING TOOL     IDENTIFICATION  Patient Name: Stacey Baker Birthdate: 21-Jan-1932 Sex: female Admission Date (Current Location): 12/31/2015  [facility] and [place] Number:  Chiropodist and Address:  [facility], [address] [place], [place] [address]      Provider Number: [id number]  Attending Physician Name and Address:  Auburn Bilberry, MD  Relative Name and Phone Number:       Current Level of Care: Hospital Recommended Level of Care: Assisted Living Facility Prior Approval Number:    Date Approved/Denied:   PASRR Number:    Discharge Plan: Domiciliary (Rest home)    Current Diagnoses: Patient Active Problem List   Diagnosis Date Noted  . Acute renal failure (ARF) (HCC) 12/31/2015  . Chronic renal insufficiency 09/15/2015  . Bradycardia 09/15/2015  . Cellulitis 09/13/2015  . Parkinson disease (HCC) 09/13/2015  . HTN (hypertension) 09/13/2015  . HLD (hyperlipidemia) 09/13/2015  . Depression 09/13/2015  . Dementia 09/13/2015  . Hyperkalemia 09/13/2015  . TIA (transient ischemic attack) 08/27/2015    Orientation RESPIRATION BLADDER Height & Weight     Self  Normal Incontinent Weight: 146 lb (66.225 kg) Height:   (160 cm)  BEHAVIORAL SYMPTOMS/MOOD NEUROLOGICAL BOWEL NUTRITION STATUS   (none )  (none ) Continent Diet (Diet: Clear Liquid )  AMBULATORY STATUS COMMUNICATION OF NEEDS Skin   Extensive Assist (Wheel Chair Bound ) Verbally Normal                       Personal Care Assistance Level of Assistance  Bathing, Feeding, Dressing Bathing Assistance: Limited assistance Feeding assistance: Limited assistance Dressing Assistance: Limited assistance     Functional Limitations Info  Sight, Hearing, Speech Sight Info: Adequate Hearing Info: Adequate Speech Info: Adequate    SPECIAL CARE FACTORS FREQUENCY                       Contractures      Additional  Factors Info  Code Status, Allergies Code Status Info:  (Full Code. ) Allergies Info:  (Penicillins, Plavix)           Current Medications (01/01/2016):  This is the current hospital active medication list Current Facility-Administered Medications  Medication Dose Route Frequency Provider Last Rate Last Dose  . 0.9 %  sodium chloride infusion   Intravenous Continuous [name], MD 100 mL/hr at 01/01/16 0308    . acetaminophen (TYLENOL) tablet 650 mg  650 mg Oral Q6H PRN [name], MD       Or  . acetaminophen (TYLENOL) suppository 650 mg  650 mg Rectal Q6H PRN [name], MD      . amLODipine (NORVASC) tablet 10 mg  10 mg Oral Daily [name], MD   10 mg at 01/01/16 0900  . antiseptic oral rinse (CPC / CETYLPYRIDINIUM CHLORIDE 0.05%) solution 7 mL  7 mL Mouth Rinse q12n4p [name], MD      . aspirin EC tablet 81 mg  81 mg Oral Daily [name], MD   81 mg at 01/01/16 0900  . atorvastatin (LIPITOR) tablet 10 mg  10 mg Oral QHS [name], MD   10 mg at 12/31/15 2132  . carbidopa-levodopa (SINEMET IR) 25-100 MG per tablet immediate release 1 tablet  1 tablet Oral TID [name], MD   1 tablet at 01/01/16 0900  . chlorhexidine (  PERIDEX) 0.12 % solution 15 mL  15 mL Mouth Rinse BID Houston SirenVivek J Sainani, MD   15 mL at 01/01/16 0900  . donepezil (ARICEPT) tablet 5 mg  5 mg Oral QHS Houston SirenVivek J Sainani, MD   5 mg at 12/31/15 2132  . escitalopram (LEXAPRO) tablet 10 mg  10 mg Oral Daily Houston SirenVivek J Sainani, MD   10 mg at 01/01/16 0900  . ferrous sulfate tablet 325 mg  325 mg Oral BID Houston SirenVivek J Sainani, MD   325 mg at 01/01/16 0900  . heparin injection 5,000 Units  5,000 Units Subcutaneous 3 times per day Houston SirenVivek J Sainani, MD   5,000 Units at 01/01/16 0500  . levETIRAcetam (KEPPRA) tablet 500 mg  500 mg Oral BID Houston SirenVivek J Sainani, MD   500 mg at 01/01/16 0900  . mirtazapine (REMERON) tablet 15 mg  15 mg Oral QHS Houston SirenVivek J Sainani, MD   15 mg at 12/31/15 2132  . ondansetron  (ZOFRAN) tablet 4 mg  4 mg Oral Q6H PRN Houston SirenVivek J Sainani, MD       Or  . ondansetron (ZOFRAN) injection 4 mg  4 mg Intravenous Q6H PRN Houston SirenVivek J Sainani, MD      . QUEtiapine (SEROQUEL) tablet 25 mg  25 mg Oral QHS Houston SirenVivek J Sainani, MD   25 mg at 12/31/15 2132  . vitamin B-12 (CYANOCOBALAMIN) tablet 1,000 mcg  1,000 mcg Oral Daily Houston SirenVivek J Sainani, MD   1,000 mcg at 01/01/16 0900     Discharge Medications: Please see discharge summary for a list of discharge medications.  Relevant Imaging Results:  Relevant Lab Results:   Additional Information  (SSN: [id number])  Haig ProphetMorgan, Camran Keady G, LCSW

## 2016-01-01 NOTE — Care Management Important Message (Signed)
Important Message  Patient Details  Name: Stacey ManchesterMiriam Baker MRN: [id number] Date of Birth: 01/10/1932   Medicare Important Message Given:  Yes    Matsue Strom A, RN 01/01/2016, 9:12 AM

## 2016-01-01 NOTE — Progress Notes (Signed)
PT Cancellation Note  Patient Details Name: Stacey ManchesterMiriam Baker MRN: [id number] DOB: 04/26/1932   Cancelled Treatment:    Reason Eval/Treat Not Completed: Patient's level of consciousness. Attempted to see patient around 9:45A. Pt is sleeping and unable to arouse. Pt does not respond to questions or commands. With tactile stimulation pt briefly opens eyes but closes them quickly and falls back to sleep. Not appropriate for PT evaluation at this time. Will attempt to see patient at later date/time as appropriate.  Sharalyn InkJason D Emari Demmer PT, DPT   [name] 01/01/2016, 11:16 AM

## 2016-01-01 NOTE — Progress Notes (Signed)
Initial Nutrition Assessment   INTERVENTION:   Coordination of Care: await diet progression as medically able Medical Food Supplement Therapy: will recommend Boost Breeze if unable to advance diet order   NUTRITION DIAGNOSIS:   Inadequate oral intake related to inability to eat as evidenced by  (CL diet order).  GOAL:   Patient will meet greater than or equal to 90% of their needs  MONITOR:   PO intake, Diet advancement, Labs, Weight trends, I & O's  REASON FOR ASSESSMENT:   Diagnosis    ASSESSMENT:    Pt admitted with weakness and acute renal failure with poor po intake and diarrhea for the past few days. Pt with h/o Parkinson's and dementia.  Past Medical History  Diagnosis Date  . Stroke (HCC)   . Dementia   . Parkinson disease (HCC)   . Hypertension   . Hyperlipidemia   . Depression   . CVA (cerebral infarction)   . Encephalopathy     Diet Order:  Diet clear liquid Room service appropriate?: Yes; Fluid consistency:: Thin   Current Nutrition: Pt currently on CL; pt sleeping soundly on visit this morning.  Food/Nutrition-Related History: Per MST pt with decreased appetite PTA. Per MD note pt with poor po intake for the past few days PTA.    Scheduled Medications:  . amLODipine  10 mg Oral Daily  . antiseptic oral rinse  7 mL Mouth Rinse q12n4p  . aspirin EC  81 mg Oral Daily  . atorvastatin  10 mg Oral QHS  . carbidopa-levodopa  1 tablet Oral TID  . chlorhexidine  15 mL Mouth Rinse BID  . donepezil  5 mg Oral QHS  . escitalopram  10 mg Oral Daily  . ferrous sulfate  325 mg Oral BID  . levETIRAcetam  500 mg Oral BID  . mirtazapine  15 mg Oral QHS  . QUEtiapine  25 mg Oral QHS  . vitamin B-12  1,000 mcg Oral Daily    Continuous Medications:  . sodium chloride 100 mL/hr at 01/01/16 1340     Electrolyte/Renal Profile and Glucose Profile:   Recent Labs Lab 12/31/15 1424 12/31/15 1757 01/01/16 0407  NA 140 140 140  K 4.4 4.6 4.2  CL 111 111  115*  CO2 20* 20* 22  BUN 48* 45* 39*  CREATININE 2.41* 2.26* 1.88*  CALCIUM 8.5* 8.8* 8.1*  GLUCOSE 96 109* 90   Protein Profile:   Recent Labs Lab 12/31/15 1424 12/31/15 1757  ALBUMIN 3.8 4.1    Gastrointestinal Profile: Last BM:  12/30/2015   Nutrition-Focused Physical Exam Findings:  Unable to complete Nutrition-Focused physical exam at this time.    Weight Change: Per [facility] weight encounters pt weight relatively stable November 2016, question accuracy of weight documented in January 2017.   Height:   Ht Readings from Last 1 Encounters:  12/31/15 5\' 3"  (1.6 m)    Weight:   Wt Readings from Last 1 Encounters:  12/31/15 146 lb (66.225 kg)   Wt Readings from Last 10 Encounters:  12/31/15 146 lb (66.225 kg)  10/31/15 189 lb (85.73 kg)  09/13/15 144 lb (65.318 kg)  08/27/15 135 lb (61.236 kg)  08/28/15 135 lb (61.236 kg)    BMI:  Body mass index is 25.87 kg/(m^2).  Estimated Nutritional Needs:   Kcal:  BEE: 1255kcals, TEE: (IF 1.1-1.3)(AF 1.2) 1445-1706kcals  Protein:  66-80g protein (1.0-1.2g/kg)  Fluid:  1650-197280mL of fluid (25-7630mL/kg)  EDUCATION NEEDS:   No education needs identified at this time  MODERATE Care Level  Dwyane Luo, [place], [place] Pager [phone] Weekend/On-Call Pager [phone]

## 2016-01-01 NOTE — Care Management Note (Signed)
Case Management Note  Patient Details  Name: Stacey Baker MRN: [id number] Date of Birth: 12/08/1931  Subjective/Objective:    83yo Mrs Stacey Baker was admitted from [facility] on 12/31/15 with generalized weakness, acute renal failure, and diarrhea. Hx of Dementia, CVA, Parkinson's Disease. PCP=Mary McGranaghan, a Palliative Care NP. [name] is wheelchair bound. She has used [facility] in the past. Currently at [facility] she is receiving no oxygen, no home health services. [facility] will [facility] and Case management will arrange home health if it is needed after discharge.             Action/Plan:   Expected Discharge Date:                  Expected Discharge Plan:     In-House Referral:     Discharge planning Services     Post Acute Care Choice:    Choice offered to:     DME Arranged:    DME Agency:     HH Arranged:    HH Agency:     Status of Service:     Medicare Important Message Given:  Yes Date Medicare IM Given:    Medicare IM give by:    Date Additional Medicare IM Given:    Additional Medicare Important Message give by:     If discussed at Long Length of Stay Meetings, dates discussed:    Additional Comments:  Jennavie Martinek A, RN 01/01/2016, 4:08 PM

## 2016-01-01 NOTE — Plan of Care (Signed)
Problem: Safety: Goal: Ability to remain free from injury will improve Outcome: Progressing Remaining free from falls this shift. Bed alarm activated. Bed in lowest position.  Problem: Pain Managment: Goal: General experience of comfort will improve Outcome: Progressing No complaints of pain.  Problem: Fluid Volume: Goal: Ability to maintain a balanced intake and output will improve Outcome: Progressing Still remaining on iv fluids.

## 2016-01-02 LAB — BASIC METABOLIC PANEL
Anion gap: 3 — ABNORMAL LOW (ref 5–15)
BUN: 24 mg/dL — AB (ref 6–20)
CALCIUM: 7.8 mg/dL — AB (ref 8.9–10.3)
CO2: 21 mmol/L — ABNORMAL LOW (ref 22–32)
CREATININE: 1.29 mg/dL — AB (ref 0.44–1.00)
Chloride: 115 mmol/L — ABNORMAL HIGH (ref 101–111)
GFR calc Af Amer: 44 mL/min — ABNORMAL LOW (ref >60)
GFR, EST NON AFRICAN AMERICAN: 38 mL/min — AB (ref >60)
GLUCOSE: 83 mg/dL (ref 65–99)
Potassium: 4.1 mmol/L (ref 3.5–5.1)
SODIUM: 139 mmol/L (ref 135–145)

## 2016-01-02 LAB — CBC
HCT: 33.3 % — ABNORMAL LOW (ref 35.0–47.0)
Hemoglobin: 11.1 g/dL — ABNORMAL LOW (ref 12.0–16.0)
MCH: 30.2 pg (ref 26.0–34.0)
MCHC: 33.4 g/dL (ref 32.0–36.0)
MCV: 90.7 fL (ref 80.0–100.0)
PLATELETS: 99 10*3/uL — AB (ref 150–440)
RBC: 3.67 MIL/uL — ABNORMAL LOW (ref 3.80–5.20)
RDW: 13.5 % (ref 11.5–14.5)
WBC: 3.9 10*3/uL (ref 3.6–11.0)

## 2016-01-02 NOTE — Progress Notes (Addendum)
Clinical Social Worker (CSW) met with patient and her son Juanda Crumble was at bedside this morning. Per son [employer] staff can contact his wife Marianna Fuss if he cannot be reached. CSW updated patient's contact list with Kerri's information. Son reported that patient and himself are originally from [place]. Patient and son are agreeable for patient to return to [facility]. Per son Nanine Means usually provides transport and if they cannot then EMS can be arranged. Patient was alert and oriented this morning and was sitting up in bed. Patient answered questions appropriately. CSW will continue to follow and assist as needed.   Blima Rich, LCSW [phone]

## 2016-01-02 NOTE — Progress Notes (Signed)
[facility] at [facility]                                                                                                                                                                                            Patient Demographics   Stacey Baker, is a 82 y.o. female, DOB - 07-09-1932, ZOX:[id number]  Admit date - 12/31/2015   Admitting Physician [name], MD  Outpatient Primary MD for the patient is [facility], MARY BETH, PA-C   LOS - 2  Subjective: More awake today. Feeling much better today. No further diarrhea.    Review of Systems:   CONSTITUTIONAL: No documented fever. Improve fatigue and weakness. No weight gain, no weight loss.  EYES: No blurry or double vision.  ENT: No tinnitus. No postnasal drip. No redness of the oropharynx.  RESPIRATORY: No cough, no wheeze, no hemoptysis. No dyspnea.  CARDIOVASCULAR: No chest pain. No orthopnea. No palpitations. No syncope.  GASTROINTESTINAL: No nausea, no vomiting or diarrhea. No abdominal pain. No melena or hematochezia.  GENITOURINARY: No dysuria or hematuria.  ENDOCRINE: No polyuria or nocturia. No heat or cold intolerance.  HEMATOLOGY: No anemia. No bruising. No bleeding.  INTEGUMENTARY: No rashes. No lesions.  MUSCULOSKELETAL: No arthritis. No swelling. No gout.  NEUROLOGIC: No numbness, tingling, or ataxia. No seizure-type activity.  PSYCHIATRIC: No anxiety. No insomnia. No ADD.    Vitals:   Filed Vitals:   01/01/16 1518 01/01/16 1954 01/02/16 0405 01/02/16 0735  BP: 140/54 124/49 133/50 138/59  Pulse: 56 56 58 60  Temp: 98 F (36.7 C) 98.3 F (36.8 C) 98 F (36.7 C) 97.4 F (36.3 C)  TempSrc: Oral Oral Oral Oral  Resp: Height:      Weight:      SpO2: 94% 98% 96% 94%    Wt Readings from Last 3 Encounters:  12/31/15 66.225 kg (146 lb)  10/31/15 85.73 kg (189 lb)  09/13/15 65.318 kg (144 lb)     Intake/Output Summary (Last 24 hours) at 01/02/16  1147 Last data filed at 01/02/16 0933  Gross per 24 hour  Intake 1678.33 ml  Output      0 ml  Net 1678.33 ml    Physical Exam:   GENERAL: Pleasant-appearing in no apparent distress.  HEAD, EYES, EARS, NOSE AND THROAT: Atraumatic, normocephalic. Extraocular muscles are intact. Pupils equal and reactive to light. Sclerae anicteric. No conjunctival injection. No oro-pharyngeal erythema.  NECK: Supple. There is no jugular venous distention. No bruits, no lymphadenopathy, no thyromegaly.  HEART: Regular rate and rhythm,. No murmurs, no rubs, no clicks.  LUNGS: Clear to auscultation bilaterally. No rales or rhonchi. No wheezes.  ABDOMEN: Soft, flat, nontender, nondistended. Has good bowel sounds. No hepatosplenomegaly appreciated.  EXTREMITIES: No evidence of any cyanosis, clubbing, or peripheral edema.  +2 pedal and radial pulses bilaterally.  NEUROLOGIC: The patient is alert, awake, and oriented x3 with no focal motor or sensory deficits appreciated bilaterally.  SKIN: Moist and warm with no rashes appreciated.  Psych: Not anxious, depressed LN: No inguinal LN enlargement    Antibiotics   Anti-infectives    None      Medications   Scheduled Meds: . amLODipine  10 mg Oral Daily  . antiseptic oral rinse  7 mL Mouth Rinse q12n4p  . aspirin EC  81 mg Oral Daily  . atorvastatin  10 mg Oral QHS  . carbidopa-levodopa  1 tablet Oral TID  . chlorhexidine  15 mL Mouth Rinse BID  . donepezil  5 mg Oral QHS  . escitalopram  10 mg Oral Daily  . ferrous sulfate  325 mg Oral BID  . levETIRAcetam  500 mg Oral BID  . mirtazapine  15 mg Oral QHS  . QUEtiapine  25 mg Oral QHS  . vitamin B-12  1,000 mcg Oral Daily   Continuous Infusions: . sodium chloride 75 mL/hr at 01/02/16 1112   PRN Meds:.acetaminophen **OR** acetaminophen, ondansetron **OR** ondansetron (ZOFRAN) IV   Data Review:   Micro Results No results found for this or any previous visit (from the past 240  hour(s)).  Radiology Reports Ct Head Wo Contrast  12/31/2015  CLINICAL DATA:  Larey Seat today and hit head.  Left-sided hematoma. EXAM: CT HEAD WITHOUT CONTRAST CT CERVICAL SPINE WITHOUT CONTRAST TECHNIQUE: Multidetector CT imaging of the head and cervical spine was performed following the standard protocol without intravenous contrast. Multiplanar CT image reconstructions of the cervical spine were also generated. COMPARISON:  Multiple prior examinations. The most recent is 08/27/2015 FINDINGS: CT HEAD FINDINGS Stable age related cerebral atrophy, ventriculomegaly and periventricular white matter disease. No extra-axial fluid collections are identified. No CT findings for acute hemispheric infarction or intracranial hemorrhage. No mass lesions. The brainstem and cerebellum are normal. No acute skull fracture is identified. The paranasal sinuses and mastoid air cells are clear. The globes are intact. The visualized facial bones are intact. CT CERVICAL SPINE FINDINGS Degenerative cervical spondylosis with multilevel disc disease and facet disease. No acute cervical spine fracture. No abnormal prevertebral soft tissue swelling. The facets are normally aligned. Facet fusions are noted. No facet or lamina fractures are identified. Stable right-sided thyroid goiter since 2015. The lung apices are grossly clear. Emphysematous changes are noted. IMPRESSION: 1. Stable age related cerebral atrophy, ventriculomegaly and periventricular white matter disease. 2. No acute intracranial findings or acute skull fracture. 3. Degenerative cervical spondylosis with multilevel disc disease and facet disease but no acute cervical spine fracture. Electronically Signed   By: Rudie Meyer M.D.   On: 12/31/2015 12:50   Ct Cervical Spine Wo Contrast  12/31/2015  CLINICAL DATA:  Larey Seat today and hit head.  Left-sided hematoma. EXAM: CT HEAD WITHOUT CONTRAST CT CERVICAL SPINE WITHOUT CONTRAST TECHNIQUE: Multidetector CT imaging of the head  and cervical spine was performed following the standard protocol without intravenous contrast. Multiplanar CT image reconstructions of the cervical spine were also generated. COMPARISON:  Multiple prior examinations. The most recent is 08/27/2015 FINDINGS: CT HEAD FINDINGS Stable age related cerebral atrophy, ventriculomegaly and periventricular white matter disease. No extra-axial fluid collections are identified. No CT findings for  acute hemispheric infarction or intracranial hemorrhage. No mass lesions. The brainstem and cerebellum are normal. No acute skull fracture is identified. The paranasal sinuses and mastoid air cells are clear. The globes are intact. The visualized facial bones are intact. CT CERVICAL SPINE FINDINGS Degenerative cervical spondylosis with multilevel disc disease and facet disease. No acute cervical spine fracture. No abnormal prevertebral soft tissue swelling. The facets are normally aligned. Facet fusions are noted. No facet or lamina fractures are identified. Stable right-sided thyroid goiter since 2015. The lung apices are grossly clear. Emphysematous changes are noted. IMPRESSION: 1. Stable age related cerebral atrophy, ventriculomegaly and periventricular white matter disease. 2. No acute intracranial findings or acute skull fracture. 3. Degenerative cervical spondylosis with multilevel disc disease and facet disease but no acute cervical spine fracture. Electronically Signed   By: Rudie Meyer M.D.   On: 12/31/2015 12:50     CBC  Recent Labs Lab 12/31/15 1249 12/31/15 1757 01/01/16 0407 01/02/16 0434  WBC 7.1 5.8 4.0 3.9  HGB 12.1 12.5 11.4* 11.1*  HCT 36.3 37.7 34.0* 33.3*  PLT 120* 118* 94* 99*  MCV 90.7 90.6 92.0 90.7  MCH 30.2 29.9 30.8 30.2  MCHC 33.3 33.0 33.4 33.4  RDW 14.1 13.8 13.7 13.5  LYMPHSABS 1.2  --   --   --   MONOABS 0.6  --   --   --   EOSABS 0.1  --   --   --   BASOSABS 0.0  --   --   --     Chemistries   Recent Labs Lab 12/31/15 1424  12/31/15 1757 01/01/16 0407 01/02/16 0434  NA 140 140 140 139  K 4.4 4.6 4.2 4.1  CL 111 111 115* 115*  CO2 20* 20* 22 21*  GLUCOSE 96 109* 90 83  BUN 48* 45* 39* 24*  CREATININE 2.41* 2.26* 1.88* 1.29*  CALCIUM 8.5* 8.8* 8.1* 7.8*  AST 41 43*  --   --   ALT 14 16  --   --   ALKPHOS 73 76  --   --   BILITOT 0.6 0.6  --   --    ------------------------------------------------------------------------------------------------------------------ estimated creatinine clearance is 31.3 mL/min (by C-G formula based on Cr of 1.29). ------------------------------------------------------------------------------------------------------------------ No results for input(s): HGBA1C in the last 72 hours. ------------------------------------------------------------------------------------------------------------------ No results for input(s): CHOL, HDL, LDLCALC, TRIG, CHOLHDL, LDLDIRECT in the last 72 hours. ------------------------------------------------------------------------------------------------------------------ No results for input(s): TSH, T4TOTAL, T3FREE, THYROIDAB in the last 72 hours.  Invalid input(s): FREET3 ------------------------------------------------------------------------------------------------------------------ No results for input(s): VITAMINB12, FOLATE, FERRITIN, TIBC, IRON, RETICCTPCT in the last 72 hours.  Coagulation profile No results for input(s): INR, PROTIME in the last 168 hours.  No results for input(s): DDIMER in the last 72 hours.  Cardiac Enzymes  Recent Labs Lab 12/31/15 1424  TROPONINI 0.04*   ------------------------------------------------------------------------------------------------------------------ Invalid input(s): POCBNP    Assessment & Plan   82 year old female with multiple medical problems who is presenting with generalized weakness and acute renal failure  #1 acute renal failure- Now improved with IV hydration decrease IV fluid  rate advance patient's diet  #2 acute diarrhea- resolved  #3 generalized weakness/falls-due to #1 and #2 and also underlying deconditioning. Seen by PT today  #4 history of Parkinson's disease-continue Sinemet.  #5 depression-continue Remeron.  #6 dementia-continue Aricept, Seroquel.      Code Status Orders        Start     Ordered   12/31/15 1742  Full code   Continuous  12/31/15 1742    Code Status History    Date Active Date Inactive Code Status Order ID Comments User Context   09/13/2015 10:55 PM 09/15/2015  8:25 PM Full Code [id number]  Oralia Manisavid Willis, MD Inpatient   08/28/2015  8:14 AM 08/29/2015  4:49 PM DNR [id number]  Delfino LovettVipul Shah, MD Inpatient   08/27/2015 11:25 PM 08/28/2015  8:14 AM Full Code [id number]  Wyatt Hasteavid K Hower, MD ED           Consults   none  DVT Prophylaxis due to platelets dropping we'll stop her DVT prophylaxis with heparin  Lab Results  Component Value Date   PLT 99* 01/02/2016     Time Spent in minutes  30min  Auburn BilberryPATEL, Kimberlynn Lumbra M.D on 01/02/2016 at 11:47 AM  Between 7am to 6pm - Pager - [phone]  After 6pm go to [email or web address] - password [email or web address] [facility]  [facility]   Office  [phone]

## 2016-01-02 NOTE — Evaluation (Signed)
Physical Therapy Evaluation Patient Details Name: Stacey Baker MRN: [id number] DOB: [birth year] Today's Date: 01/02/2016   History of Present Illness  Stacey Baker  is a 82 y.o. female with a known history of dementia, previous CVA, Parkinson's disease, hypertension, hyperlipidemia, depression, history of previous UTIs who presents to the hospital due to generalized weakness frequent falls and noted to be in acute renal failure. Patient says that she's been feeling increasingly weak over the past few days and has had frequent falls at her assisted living. She denies having any head trauma or loss of consciousness. She says she's been having some diarrhea over the past few days which has been associated with some nausea and vomiting. She also admits to poor by mouth intake over the past few days. Patient presented to the hospital was noted to be in acute renal failure with a creatinine of 2.4. Medical record indicates she is wheelchair bound however pt states she transfers independently from bed to wheelchair at facility. She also ambulates approximately 94' at a time with physical therapy. Pt reports multiple falls over the last 12 months which is why her facility has kept her contained mostly to a wheelchair for mobility.   Clinical Impression  Pt demonstrates close to baseline mobility based on subjective reports from patient and son. She is limited in her mobility but does perform limited transfers and short distance ambulation at facility. She mostly uses wheelchair at facility due to their requests due to frequent falls. Pt reports she would like to receive PT while at [facility]. She requires assistance for bed mobility, transfers, and very limited ambulation forward/backwards at bedside. When medically appropriate pt will be safe to DC back to ALF and resume PT services at facility. Pt will benefit from skilled PT services to address deficits in strength, balance, and mobility in order to return to full  function at home.     Follow Up Recommendations Home health PT;Other (comment) (Continue PT at ALF)    Equipment Recommendations  None recommended by PT    Recommendations for Other Services       Precautions / Restrictions Precautions Precautions: Fall Restrictions Weight Bearing Restrictions: No      Mobility  Bed Mobility Overal bed mobility: Needs Assistance Bed Mobility: Supine to Sit;Sit to Supine     Supine to sit: Min assist Sit to supine: Min assist;+2 for safety/equipment   General bed mobility comments: Pt requires cues and use of bed rail. HOB elevated. Moves very slowly  Transfers Overall transfer level: Needs assistance Equipment used: Rolling walker (2 wheeled) Transfers: Sit to/from Stand Sit to Stand: Min assist;+2 physical assistance         General transfer comment: Pt with decreased anterior weight shifting and decreased LE strength. Cues for hand placement and safety  Ambulation/Gait Ambulation/Gait assistance: Min assist;+2 physical assistance Ambulation Distance (Feet): 6 Feet Assistive device: Rolling walker (2 wheeled) Gait Pattern/deviations: Step-to pattern Gait velocity: Decreased Gait velocity interpretation: <1.8 ft/sec, indicative of risk for recurrent falls General Gait Details: Pt takes small steps forward/backward at EOB with minA+2 and rolling walker. Decreased speed and step length. Crouched posture with LE flexion. Pt with poor safety awareness  Stairs            Wheelchair Mobility    Modified Rankin (Stroke Patients Only)       Balance Overall balance assessment: Needs assistance Sitting-balance support: No upper extremity supported Sitting balance-Leahy Scale: Fair     Standing balance support: Bilateral upper extremity  supported Standing balance-Leahy Scale: Poor                               Pertinent Vitals/Pain Pain Assessment: 0-10 Pain Score: 2  Pain Location: Back of head at site of  contusion on right site Pain Descriptors / Indicators: Aching Pain Intervention(s): Monitored during session    Home Living Family/patient expects to be discharged to:: Assisted living               Home Equipment: Walker - 2 wheels;Wheelchair - manual Additional Comments: Patient presents from [facility].     Prior Function Level of Independence: Needs assistance   Gait / Transfers Assistance Needed: Patient has been assisted with RW to transfer to and from wheelchair. She ambulates with physical therapy approximately 50 yards or more  ADL's / Homemaking Assistance Needed: Assist with bathing/dressing. Independent with feeding  Comments: Patient states she has been able to transfer to and from a w/c currently without assistance.      Hand Dominance   Dominant Hand: Right    Extremity/Trunk Assessment   Upper Extremity Assessment: Generalized weakness           Lower Extremity Assessment: Generalized weakness         Communication   Communication: HOH  Cognition Arousal/Alertness: Awake/alert Behavior During Therapy: WFL for tasks assessed/performed Overall Cognitive Status: Within Functional Limits for tasks assessed                      General Comments      Exercises General Exercises - Lower Extremity Ankle Circles/Pumps: Strengthening;Both;10 reps;Supine Short Arc Quad: Strengthening;Both;10 reps;Supine Heel Slides: Strengthening;Both;10 reps;Supine Hip ABduction/ADduction: Strengthening;Both;10 reps;Supine Straight Leg Raises: Strengthening;Both;10 reps;Supine      Assessment/Plan    PT Assessment Patient needs continued PT services  PT Diagnosis Difficulty walking;Abnormality of gait;Generalized weakness   PT Problem List Decreased strength;Decreased activity tolerance;Decreased balance;Decreased mobility;Decreased safety awareness;Decreased knowledge of precautions  PT Treatment Interventions DME instruction;Gait  training;Functional mobility training;Therapeutic activities;Therapeutic exercise;Balance training;Neuromuscular re-education;Patient/family education   PT Goals (Current goals can be found in the Care Plan section) Acute Rehab PT Goals Patient Stated Goal: "[facility] PT Goal Formulation: With patient/family Time For Goal Achievement: 01/16/16 Potential to Achieve Goals: Good    Frequency Min 2X/week   Barriers to discharge        Co-evaluation               End of Session Equipment Utilized During Treatment: Gait belt Activity Tolerance: Patient tolerated treatment well Patient left: in bed;with call bell/phone within reach;with bed alarm set;with family/visitor present;with nursing/sitter in room Nurse Communication: Mobility status         Time: 1035-1100 PT Time Calculation (min) (ACUTE ONLY): 25 min   Charges:   PT Evaluation $PT Eval Low Complexity: 1 Procedure PT Treatments $Therapeutic Exercise: 8-22 mins   PT G Codes:       Sharalyn InkJason D Demontrae Gilbert PT, DPT   Trevis Eden 01/02/2016, 12:23 PM

## 2016-01-03 ENCOUNTER — Inpatient Hospital Stay: Payer: Medicare Other

## 2016-01-03 LAB — BASIC METABOLIC PANEL
Anion gap: 4 — ABNORMAL LOW (ref 5–15)
BUN: 16 mg/dL (ref 6–20)
CHLORIDE: 113 mmol/L — AB (ref 101–111)
CO2: 23 mmol/L (ref 22–32)
CREATININE: 1.13 mg/dL — AB (ref 0.44–1.00)
Calcium: 8.4 mg/dL — ABNORMAL LOW (ref 8.9–10.3)
GFR calc Af Amer: 51 mL/min — ABNORMAL LOW (ref >60)
GFR calc non Af Amer: 44 mL/min — ABNORMAL LOW (ref >60)
Glucose, Bld: 110 mg/dL — ABNORMAL HIGH (ref 65–99)
Potassium: 3.6 mmol/L (ref 3.5–5.1)
Sodium: 140 mmol/L (ref 135–145)

## 2016-01-03 LAB — CBC
HCT: 34.9 % — ABNORMAL LOW (ref 35.0–47.0)
Hemoglobin: 11.6 g/dL — ABNORMAL LOW (ref 12.0–16.0)
MCH: 29.7 pg (ref 26.0–34.0)
MCHC: 33.3 g/dL (ref 32.0–36.0)
MCV: 89.1 fL (ref 80.0–100.0)
PLATELETS: 112 10*3/uL — AB (ref 150–440)
RBC: 3.92 MIL/uL (ref 3.80–5.20)
RDW: 13.6 % (ref 11.5–14.5)
WBC: 6.1 10*3/uL (ref 3.6–11.0)

## 2016-01-03 NOTE — Care Management (Signed)
Per [facility] patient's home health PT was recently closed out. MD if you want patient to have HHPT new orders will be needed. I will send referral to [facility] if you feel patient would benefit from it again. Thanks.

## 2016-01-03 NOTE — [place] FL2 (Addendum)
[place] MEDICAID FL2 LEVEL OF CARE SCREENING TOOL     IDENTIFICATION  Patient Name: Stacey Baker Birthdate: November 04, 1931 Sex: female Admission Date (Current Location): 12/31/2015  [facility] and [place] Number:  Chiropodist and Address:  [facility], [address] [place], [place] [address]      Provider Number: [id number]  Attending Physician Name and Address:  Auburn Bilberry, MD  Relative Name and Phone Number:       Current Level of Care: Hospital Recommended Level of Care: Assisted Living Facility Prior Approval Number:    Date Approved/Denied:   PASRR Number:    Discharge Plan: Domiciliary (Rest home)    Current Diagnoses: Patient Active Problem List   Diagnosis Date Noted  . Acute renal failure (ARF) (HCC) 12/31/2015  . Chronic renal insufficiency 09/15/2015  . Bradycardia 09/15/2015  . Cellulitis 09/13/2015  . Parkinson disease (HCC) 09/13/2015  . HTN (hypertension) 09/13/2015  . HLD (hyperlipidemia) 09/13/2015  . Depression 09/13/2015  . Dementia 09/13/2015  . Hyperkalemia 09/13/2015  . TIA (transient ischemic attack) 08/27/2015    Orientation RESPIRATION BLADDER Height & Weight     Self  Normal Incontinent Weight: 146 lb (66.225 kg) Height:   (160 cm)  BEHAVIORAL SYMPTOMS/MOOD NEUROLOGICAL BOWEL NUTRITION STATUS   (none )  (none ) Continent Regular Diet   AMBULATORY STATUS COMMUNICATION OF NEEDS Skin   Extensive Assist (Wheel Chair Bound ) Verbally Normal                       Personal Care Assistance Level of Assistance  Bathing, Feeding, Dressing Bathing Assistance: Limited assistance Feeding assistance: Limited assistance Dressing Assistance: Limited assistance     Functional Limitations Info  Sight, Hearing, Speech Sight Info: Adequate Hearing Info: Adequate Speech Info: Adequate    SPECIAL CARE FACTORS FREQUENCY   PT   2-3 times per week (Home Health PT)                    Contractures      Additional Factors Info  Code Status, Allergies Code Status Info:  (Full Code. ) Allergies Info:  (Penicillins, Plavix)          Discharge Medications: Current Discharge Medication List    START taking these medications   Details  levofloxacin (LEVAQUIN) 250 MG tablet Take 1 tablet (250 mg total) by mouth daily. Qty: 8 tablet, Refills: 0      CONTINUE these medications which have NOT CHANGED   Details  amLODipine (NORVASC) 10 MG tablet Take 1 tablet (10 mg total) by mouth daily. Qty: 30 tablet, Refills: 5    aspirin EC 81 MG tablet Take 81 mg by mouth daily.    atorvastatin (LIPITOR) 10 MG tablet Take 10 mg by mouth at bedtime.    carbidopa-levodopa (SINEMET IR) 25-100 MG tablet Take 1 tablet by mouth 3 (three) times daily.    diclofenac sodium (VOLTAREN) 1 % GEL Apply 2 g topically 2 (two) times daily. Pt applies to left knee.    donepezil (ARICEPT) 5 MG tablet Take 5 mg by mouth at bedtime.    escitalopram (LEXAPRO) 10 MG tablet Take 10 mg by mouth daily.    ferrous sulfate 325 (65 FE) MG tablet Take 325 mg by mouth 2 (two) times daily.    levETIRAcetam (KEPPRA) 500 MG tablet Take 500 mg by mouth 2 (two) times daily.    mirtazapine (REMERON) 15 MG tablet Take 15 mg  by mouth at bedtime.    QUEtiapine (SEROQUEL) 25 MG tablet Take 25 mg by mouth at bedtime.    vitamin B-12 (CYANOCOBALAMIN) 1000 MCG tablet Take 1,000 mcg by mouth daily.      STOP taking these medications     furosemide (LASIX) 20 MG tablet      potassium chloride (K-DUR,KLOR-CON) 10 MEQ tablet      clindamycin (CLEOCIN) 300 MG capsule               Relevant Imaging Results:  Relevant Lab Results:   Additional Information  (SSN: [id number])  Haig ProphetMorgan, Bailey G, LCSW

## 2016-01-03 NOTE — Progress Notes (Signed)
Pt having fever cancel d/c check cxr, and ua

## 2016-01-03 NOTE — Discharge Summary (Signed)
Stacey Baker, 82 y.o., DOB 02-18-1932, MRN [id number]. Admission date: 12/31/2015 Discharge Date 01/03/2016 Primary MD Stacey Pulling, PA-C Admitting Physician Stacey Siren, MD  Admission Diagnosis  Acute on chronic renal insufficiency (HCC) [N28.9, N18.9] Generalized weakness [R53.1] Fall, initial encounter [W19.XXXA]  Discharge Diagnosis   Active Problems:   Acute renal failure (ARF) (HCC) Dehydration Status post fall History of CVA Dementia Parkinson's is Hypertension Hyperlipidemia       Hospital Course Stacey Baker is a 82 y.o. female with a known history of dementia, previous CVA, Parkinson's disease, hypertension, hyperlipidemia, depression, history of previous UTIs who presents to the hospital due to generalized weakness frequent falls and noted to be in acute renal failure. She was admitted to the hospital given IV fluids. Renal failure resolved with IV fluids. Patient feeling much better. She does have dementia and is intermittently confused. Her urinalysis showed no evidence of infection. Her Lasix has been discontinued. Patient will need to have continued physical therapy at assisted living.         Consults  None  Significant Tests:  See full reports for all details      Ct Head Wo Contrast  12/31/2015  CLINICAL DATA:  Larey Seat today and hit head.  Left-sided hematoma. EXAM: CT HEAD WITHOUT CONTRAST CT CERVICAL SPINE WITHOUT CONTRAST TECHNIQUE: Multidetector CT imaging of the head and cervical spine was performed following the standard protocol without intravenous contrast. Multiplanar CT image reconstructions of the cervical spine were also generated. COMPARISON:  Multiple prior examinations. The most recent is 08/27/2015 FINDINGS: CT HEAD FINDINGS Stable age related cerebral atrophy, ventriculomegaly and periventricular white matter disease. No extra-axial fluid collections are identified. No CT findings for acute hemispheric infarction or intracranial  hemorrhage. No mass lesions. The brainstem and cerebellum are normal. No acute skull fracture is identified. The paranasal sinuses and mastoid air cells are clear. The globes are intact. The visualized facial bones are intact. CT CERVICAL SPINE FINDINGS Degenerative cervical spondylosis with multilevel disc disease and facet disease. No acute cervical spine fracture. No abnormal prevertebral soft tissue swelling. The facets are normally aligned. Facet fusions are noted. No facet or lamina fractures are identified. Stable right-sided thyroid goiter since 2015. The lung apices are grossly clear. Emphysematous changes are noted. IMPRESSION: 1. Stable age related cerebral atrophy, ventriculomegaly and periventricular white matter disease. 2. No acute intracranial findings or acute skull fracture. 3. Degenerative cervical spondylosis with multilevel disc disease and facet disease but no acute cervical spine fracture. Electronically Signed   By: Stacey Baker M.D.   On: 12/31/2015 12:50   Ct Cervical Spine Wo Contrast  12/31/2015  CLINICAL DATA:  Larey Seat today and hit head.  Left-sided hematoma. EXAM: CT HEAD WITHOUT CONTRAST CT CERVICAL SPINE WITHOUT CONTRAST TECHNIQUE: Multidetector CT imaging of the head and cervical spine was performed following the standard protocol without intravenous contrast. Multiplanar CT image reconstructions of the cervical spine were also generated. COMPARISON:  Multiple prior examinations. The most recent is 08/27/2015 FINDINGS: CT HEAD FINDINGS Stable age related cerebral atrophy, ventriculomegaly and periventricular white matter disease. No extra-axial fluid collections are identified. No CT findings for acute hemispheric infarction or intracranial hemorrhage. No mass lesions. The brainstem and cerebellum are normal. No acute skull fracture is identified. The paranasal sinuses and mastoid air cells are clear. The globes are intact. The visualized facial bones are intact. CT CERVICAL SPINE  FINDINGS Degenerative cervical spondylosis with multilevel disc disease and facet disease. No acute cervical spine fracture. No abnormal  prevertebral soft tissue swelling. The facets are normally aligned. Facet fusions are noted. No facet or lamina fractures are identified. Stable right-sided thyroid goiter since 2015. The lung apices are grossly clear. Emphysematous changes are noted. IMPRESSION: 1. Stable age related cerebral atrophy, ventriculomegaly and periventricular white matter disease. 2. No acute intracranial findings or acute skull fracture. 3. Degenerative cervical spondylosis with multilevel disc disease and facet disease but no acute cervical spine fracture. Electronically Signed   By: Stacey Baker M.D.   On: 12/31/2015 12:50       Today   Subjective:   Stacey PartridgeMiriam Baker  patient is confused but easily reoriented. No complaints  Objective:   Blood pressure 121/45, pulse 85, temperature 99.3 F (37.4 C), temperature source Oral, resp. rate 16, height 5\' 3"  (1.6 m), weight 66.225 kg (146 lb), SpO2 96 %.  .  Intake/Output Summary (Last 24 hours) at 01/03/16 1024 Last data filed at 01/02/16 2357  Gross per 24 hour  Intake 3057.92 ml  Output      0 ml  Net 3057.92 ml    Exam VITAL SIGNS: Blood pressure 121/45, pulse 85, temperature 99.3 F (37.4 C), temperature source Oral, resp. rate 16, height 5\' 3"  (1.6 m), weight 66.225 kg (146 lb), SpO2 96 %.  GENERAL:  82 y.o.-year-old patient lying in the bed with no acute distress.  EYES: Pupils equal, round, reactive to light and accommodation. No scleral icterus. Extraocular muscles intact.  HEENT: Head atraumatic, normocephalic. Oropharynx and nasopharynx clear.  NECK:  Supple, no jugular venous distention. No thyroid enlargement, no tenderness.  LUNGS: Normal breath sounds bilaterally, no wheezing, rales,rhonchi or crepitation. No use of accessory muscles of respiration.  CARDIOVASCULAR: S1, S2 normal. No murmurs, rubs, or gallops.   ABDOMEN: Soft, nontender, nondistended. Bowel sounds present. No organomegaly or mass.  EXTREMITIES: No pedal edema, cyanosis, or clubbing.  NEUROLOGIC: Cranial nerves II through XII are intact. Muscle strength 5/5 in all extremities. Sensation intact. Gait not checked.  PSYCHIATRIC: The patient is alert and oriented x 3.  SKIN: No obvious rash, lesion, or ulcer.   Data Review     CBC w Diff: Lab Results  Component Value Date   WBC 3.9 01/02/2016   WBC 9.0 01/13/2015   HGB 11.1* 01/02/2016   HGB 10.7* 01/13/2015   HCT 33.3* 01/02/2016   HCT 31.4* 01/13/2015   PLT 99* 01/02/2016   PLT 125* 01/13/2015   LYMPHOPCT 17 12/31/2015   LYMPHOPCT 17.6 01/13/2015   MONOPCT 9 12/31/2015   MONOPCT 12.3 01/13/2015   EOSPCT 1 12/31/2015   EOSPCT 0.4 01/13/2015   BASOPCT 1 12/31/2015   BASOPCT 0.6 01/13/2015   CMP: Lab Results  Component Value Date   NA 140 01/03/2016   NA 144 01/15/2015   K 3.6 01/03/2016   K 4.6 01/15/2015   CL 113* 01/03/2016   CL 112* 01/15/2015   CO2 23 01/03/2016   CO2 22 01/15/2015   BUN 16 01/03/2016   BUN 24* 01/15/2015   CREATININE 1.13* 01/03/2016   CREATININE 1.09* 01/15/2015   PROT 7.2 12/31/2015   PROT 7.0 01/12/2015   ALBUMIN 4.1 12/31/2015   ALBUMIN 4.1 01/12/2015   BILITOT 0.6 12/31/2015   BILITOT 0.7 01/12/2015   ALKPHOS 76 12/31/2015   ALKPHOS 76 01/12/2015   AST 43* 12/31/2015   AST 23 01/12/2015   ALT 16 12/31/2015   ALT <5 01/12/2015  .  Micro Results No results found for this or any previous visit (from the  past 240 hour(s)).      Code Status Orders        Start     Ordered   12/31/15 1742  Full code   Continuous     12/31/15 1742    Code Status History    Date Active Date Inactive Code Status Order ID Comments User Context   09/13/2015 10:55 PM 09/15/2015  8:25 PM Full Code [id number]  Oralia Manis, MD Inpatient   08/28/2015  8:14 AM 08/29/2015  4:49 PM DNR [id number]  Delfino Lovett, MD Inpatient   08/27/2015 11:25 PM  08/28/2015  8:14 AM Full Code [id number]  Wyatt Haste, MD ED            Discharge Medications     Medication List    STOP taking these medications        clindamycin 300 MG capsule  Commonly known as:  CLEOCIN     furosemide 20 MG tablet  Commonly known as:  LASIX     potassium chloride 10 MEQ tablet  Commonly known as:  K-DUR,KLOR-CON      TAKE these medications        amLODipine 10 MG tablet  Commonly known as:  NORVASC  Take 1 tablet (10 mg total) by mouth daily.     aspirin EC 81 MG tablet  Take 81 mg by mouth daily.     atorvastatin 10 MG tablet  Commonly known as:  LIPITOR  Take 10 mg by mouth at bedtime.     carbidopa-levodopa 25-100 MG tablet  Commonly known as:  SINEMET IR  Take 1 tablet by mouth 3 (three) times daily.     diclofenac sodium 1 % Gel  Commonly known as:  VOLTAREN  Apply 2 g topically 2 (two) times daily. Pt applies to left knee.     donepezil 5 MG tablet  Commonly known as:  ARICEPT  Take 5 mg by mouth at bedtime.     escitalopram 10 MG tablet  Commonly known as:  LEXAPRO  Take 10 mg by mouth daily.     ferrous sulfate 325 (65 FE) MG tablet  Take 325 mg by mouth 2 (two) times daily.     levETIRAcetam 500 MG tablet  Commonly known as:  KEPPRA  Take 500 mg by mouth 2 (two) times daily.     mirtazapine 15 MG tablet  Commonly known as:  REMERON  Take 15 mg by mouth at bedtime.     QUEtiapine 25 MG tablet  Commonly known as:  SEROQUEL  Take 25 mg by mouth at bedtime.     vitamin B-12 1000 MCG tablet  Commonly known as:  CYANOCOBALAMIN  Take 1,000 mcg by mouth daily.           Total Time in preparing paper work, data evaluation and todays exam - 35 minutes  Auburn Bilberry M.D on 01/03/2016 at 10:24 AM  [facility]   Office  [phone]

## 2016-01-03 NOTE — Care Management Important Message (Signed)
Important Message  Patient Details  Name: Stacey ManchesterMiriam Baker MRN: [id number] Date of Birth: 06/22/1932   Medicare Important Message Given:  Yes    Olegario MessierKathy A Tashanna Dolin 01/03/2016, 10:36 AM

## 2016-01-03 NOTE — Progress Notes (Signed)
Per RN patient's discharge order was cancelled today due to fever. Clinical Child psychotherapistocial Worker (CSW) contacted [facility] and made them aware of above. CSW will continue to follow and assist as needed.   Jetta LoutBailey Morgan, LCSW [phone]

## 2016-01-03 NOTE — Progress Notes (Signed)
[facility] - [place] at [facility]                                                                                                                                                                                            Patient Demographics   Stacey Baker, is a 82 y.o. female, DOB - December 06, 1931, ZOX:[id number]  Admit date - 12/31/2015   Admitting Physician [name], MD  Outpatient Primary MD for the patient is [facility], MARY BETH, PA-C   LOS - 3  Subjective: Patient pleasantly confused    Review of Systems:   CONSTITUTIONAL: Positive fever. Improve fatigue and weakness. No weight gain, no weight loss.  EYES: No blurry or double vision.  ENT: No tinnitus. No postnasal drip. No redness of the oropharynx.  RESPIRATORY: No cough, no wheeze, no hemoptysis. No dyspnea.  CARDIOVASCULAR: No chest pain. No orthopnea. No palpitations. No syncope.  GASTROINTESTINAL: No nausea, no vomiting or diarrhea. No abdominal pain. No melena or hematochezia.  GENITOURINARY: No dysuria or hematuria.  ENDOCRINE: No polyuria or nocturia. No heat or cold intolerance.  HEMATOLOGY: No anemia. No bruising. No bleeding.  INTEGUMENTARY: No rashes. No lesions.  MUSCULOSKELETAL: No arthritis. No swelling. No gout.  NEUROLOGIC: No numbness, tingling, or ataxia. No seizure-type activity.  PSYCHIATRIC: No anxiety. No insomnia. No ADD.    Vitals:   Filed Vitals:   01/03/16 0355 01/03/16 0806 01/03/16 0808 01/03/16 1155  BP: 151/59 100/46 121/45   Pulse: 87 85 85   Temp: 97.8 F (36.6 C) 99.3 F (37.4 C)  99.8 F (37.7 C)  TempSrc: Oral Oral  Axillary  Resp: 16     Height:      Weight:      SpO2: 97% 95% 96%     Wt Readings from Last 3 Encounters:  12/31/15 66.225 kg (146 lb)  10/31/15 85.73 kg (189 lb)  09/13/15 65.318 kg (144 lb)     Intake/Output Summary (Last 24 hours) at 01/03/16 1359 Last data filed at 01/03/16 1300  Gross per 24 hour  Intake 2937.92 ml   Output      0 ml  Net 2937.92 ml    Physical Exam:   GENERAL: Pleasant-appearing in no apparent distress.  HEAD, EYES, EARS, NOSE AND THROAT: Atraumatic, normocephalic. Extraocular muscles are intact. Pupils equal and reactive to light. Sclerae anicteric. No conjunctival injection. No oro-pharyngeal erythema.  NECK: Supple. There is no jugular venous distention. No bruits, no lymphadenopathy, no thyromegaly.  HEART: Regular rate and rhythm,. No murmurs, no rubs, no clicks.  LUNGS: Clear to auscultation bilaterally. No rales or rhonchi. No  wheezes.  ABDOMEN: Soft, flat, nontender, nondistended. Has good bowel sounds. No hepatosplenomegaly appreciated.  EXTREMITIES: No evidence of any cyanosis, clubbing, or peripheral edema.  +2 pedal and radial pulses bilaterally.  NEUROLOGIC: The patient is alert, awake, Not oriented toplace or time with no focal motor or sensory deficits appreciated bilaterally.  SKIN: Moist and warm with no rashes appreciated.  Psych: Not anxious, depressed LN: No inguinal LN enlargement    Antibiotics   Anti-infectives    None      Medications   Scheduled Meds: . amLODipine  10 mg Oral Daily  . antiseptic oral rinse  7 mL Mouth Rinse q12n4p  . aspirin EC  81 mg Oral Daily  . atorvastatin  10 mg Oral QHS  . carbidopa-levodopa  1 tablet Oral TID  . chlorhexidine  15 mL Mouth Rinse BID  . donepezil  5 mg Oral QHS  . escitalopram  10 mg Oral Daily  . ferrous sulfate  325 mg Oral BID  . levETIRAcetam  500 mg Oral BID  . mirtazapine  15 mg Oral QHS  . QUEtiapine  25 mg Oral QHS  . vitamin B-12  1,000 mcg Oral Daily   Continuous Infusions: . sodium chloride 75 mL/hr at 01/02/16 2357   PRN Meds:.acetaminophen **OR** acetaminophen, ondansetron **OR** ondansetron (ZOFRAN) IV   Data Review:   Micro Results No results found for this or any previous visit (from the past 240 hour(s)).  Radiology Reports Ct Head Wo Contrast  12/31/2015  CLINICAL DATA:   Larey SeatFell today and hit head.  Left-sided hematoma. EXAM: CT HEAD WITHOUT CONTRAST CT CERVICAL SPINE WITHOUT CONTRAST TECHNIQUE: Multidetector CT imaging of the head and cervical spine was performed following the standard protocol without intravenous contrast. Multiplanar CT image reconstructions of the cervical spine were also generated. COMPARISON:  Multiple prior examinations. The most recent is 08/27/2015 FINDINGS: CT HEAD FINDINGS Stable age related cerebral atrophy, ventriculomegaly and periventricular white matter disease. No extra-axial fluid collections are identified. No CT findings for acute hemispheric infarction or intracranial hemorrhage. No mass lesions. The brainstem and cerebellum are normal. No acute skull fracture is identified. The paranasal sinuses and mastoid air cells are clear. The globes are intact. The visualized facial bones are intact. CT CERVICAL SPINE FINDINGS Degenerative cervical spondylosis with multilevel disc disease and facet disease. No acute cervical spine fracture. No abnormal prevertebral soft tissue swelling. The facets are normally aligned. Facet fusions are noted. No facet or lamina fractures are identified. Stable right-sided thyroid goiter since 2015. The lung apices are grossly clear. Emphysematous changes are noted. IMPRESSION: 1. Stable age related cerebral atrophy, ventriculomegaly and periventricular white matter disease. 2. No acute intracranial findings or acute skull fracture. 3. Degenerative cervical spondylosis with multilevel disc disease and facet disease but no acute cervical spine fracture. Electronically Signed   By: Rudie MeyerP.  Gallerani M.D.   On: 12/31/2015 12:50   Ct Cervical Spine Wo Contrast  12/31/2015  CLINICAL DATA:  Larey SeatFell today and hit head.  Left-sided hematoma. EXAM: CT HEAD WITHOUT CONTRAST CT CERVICAL SPINE WITHOUT CONTRAST TECHNIQUE: Multidetector CT imaging of the head and cervical spine was performed following the standard protocol without intravenous  contrast. Multiplanar CT image reconstructions of the cervical spine were also generated. COMPARISON:  Multiple prior examinations. The most recent is 08/27/2015 FINDINGS: CT HEAD FINDINGS Stable age related cerebral atrophy, ventriculomegaly and periventricular white matter disease. No extra-axial fluid collections are identified. No CT findings for acute hemispheric infarction or intracranial hemorrhage. No mass  lesions. The brainstem and cerebellum are normal. No acute skull fracture is identified. The paranasal sinuses and mastoid air cells are clear. The globes are intact. The visualized facial bones are intact. CT CERVICAL SPINE FINDINGS Degenerative cervical spondylosis with multilevel disc disease and facet disease. No acute cervical spine fracture. No abnormal prevertebral soft tissue swelling. The facets are normally aligned. Facet fusions are noted. No facet or lamina fractures are identified. Stable right-sided thyroid goiter since 2015. The lung apices are grossly clear. Emphysematous changes are noted. IMPRESSION: 1. Stable age related cerebral atrophy, ventriculomegaly and periventricular white matter disease. 2. No acute intracranial findings or acute skull fracture. 3. Degenerative cervical spondylosis with multilevel disc disease and facet disease but no acute cervical spine fracture. Electronically Signed   By: Rudie Meyer M.D.   On: 12/31/2015 12:50     CBC  Recent Labs Lab 12/31/15 1249 12/31/15 1757 01/01/16 0407 01/02/16 0434 01/03/16 1318  WBC 7.1 5.8 4.0 3.9 6.1  HGB 12.1 12.5 11.4* 11.1* 11.6*  HCT 36.3 37.7 34.0* 33.3* 34.9*  PLT 120* 118* 94* 99* 112*  MCV 90.7 90.6 92.0 90.7 89.1  MCH 30.2 29.9 30.8 30.2 29.7  MCHC 33.3 33.0 33.4 33.4 33.3  RDW 14.1 13.8 13.7 13.5 13.6  LYMPHSABS 1.2  --   --   --   --   MONOABS 0.6  --   --   --   --   EOSABS 0.1  --   --   --   --   BASOSABS 0.0  --   --   --   --     Chemistries   Recent Labs Lab 12/31/15 1424  12/31/15 1757 01/01/16 0407 01/02/16 0434 01/03/16 0434  NA 140 140 140 139 140  K 4.4 4.6 4.2 4.1 3.6  CL 111 111 115* 115* 113*  CO2 20* 20* 22 21* 23  GLUCOSE 96 109* 90 83 110*  BUN 48* 45* 39* 24* 16  CREATININE 2.41* 2.26* 1.88* 1.29* 1.13*  CALCIUM 8.5* 8.8* 8.1* 7.8* 8.4*  AST 41 43*  --   --   --   ALT 14 16  --   --   --   ALKPHOS 73 76  --   --   --   BILITOT 0.6 0.6  --   --   --    ------------------------------------------------------------------------------------------------------------------ estimated creatinine clearance is 35.7 mL/min (by C-G formula based on Cr of 1.13). ------------------------------------------------------------------------------------------------------------------ No results for input(s): HGBA1C in the last 72 hours. ------------------------------------------------------------------------------------------------------------------ No results for input(s): CHOL, HDL, LDLCALC, TRIG, CHOLHDL, LDLDIRECT in the last 72 hours. ------------------------------------------------------------------------------------------------------------------ No results for input(s): TSH, T4TOTAL, T3FREE, THYROIDAB in the last 72 hours.  Invalid input(s): FREET3 ------------------------------------------------------------------------------------------------------------------ No results for input(s): VITAMINB12, FOLATE, FERRITIN, TIBC, IRON, RETICCTPCT in the last 72 hours.  Coagulation profile No results for input(s): INR, PROTIME in the last 168 hours.  No results for input(s): DDIMER in the last 72 hours.  Cardiac Enzymes  Recent Labs Lab 12/31/15 1424  TROPONINI 0.04*   ------------------------------------------------------------------------------------------------------------------ Invalid input(s): POCBNP    Assessment & Plan   82 year old female with multiple medical problems who is presenting with generalized weakness and acute renal failure  #1  fever-I will check a CBC and a chest x-ray urinalysis hold discharge   #2 acute renal failure-Resolve    #3 acute diarrhea- resolved  #4 history of Parkinson's disease-continue Sinemet.  #5 depression-continue Remeron.  #6 dementia-continue Aricept, Seroquel.      Code  Status Orders        Start     Ordered   12/31/15 1742  Full code   Continuous     12/31/15 1742    Code Status History    Date Active Date Inactive Code Status Order ID Comments User Context   09/13/2015 10:55 PM 09/15/2015  8:25 PM Full Code [id number]  Oralia Manis, MD Inpatient   08/28/2015  8:14 AM 08/29/2015  4:49 PM DNR [id number]  Delfino Lovett, MD Inpatient   08/27/2015 11:25 PM 08/28/2015  8:14 AM Full Code [id number]  Wyatt Haste, MD ED           Consults   none  DVT Prophylaxis due to platelets dropping we'll stop her DVT prophylaxis with heparin  Lab Results  Component Value Date   PLT 112* 01/03/2016     Time Spent in minutes   Auburn Bilberry M.D on 01/03/2016 at 1:59 PM  Between 7am to 6pm - Pager - [phone]  After 6pm go to [email or web address] - password [email or web address] [facility]  [facility] [place] [facility]   Office  [phone]

## 2016-01-04 ENCOUNTER — Inpatient Hospital Stay: Payer: Medicare Other

## 2016-01-04 MED ORDER — MAGNESIUM HYDROXIDE 400 MG/5ML PO SUSP
30.0000 mL | Freq: Every day | ORAL | Status: DC | PRN
  Administered 2016-01-05: 30 mL via ORAL
  Filled 2016-01-04: qty 30

## 2016-01-04 MED ORDER — DOCUSATE SODIUM 100 MG PO CAPS
100.0000 mg | ORAL_CAPSULE | Freq: Two times a day (BID) | ORAL | Status: DC
  Administered 2016-01-04 – 2016-01-06 (×4): 100 mg via ORAL
  Filled 2016-01-04 (×5): qty 1

## 2016-01-04 MED ORDER — LEVOFLOXACIN 500 MG PO TABS: 250.0000 mg | ORAL_TABLET | Freq: Every day | ORAL | Status: DC

## 2016-01-04 MED ORDER — LEVOFLOXACIN IN D5W 250 MG/50ML IV SOLN
250.0000 mg | INTRAVENOUS | Status: DC
  Filled 2016-01-04: qty 50

## 2016-01-04 MED ORDER — LEVOFLOXACIN 500 MG PO TABS: 500.0000 mg | ORAL_TABLET | Freq: Every day | ORAL | Status: DC

## 2016-01-04 MED ORDER — LEVOFLOXACIN 500 MG PO TABS
500.0000 mg | ORAL_TABLET | Freq: Once | ORAL | Status: DC
  Filled 2016-01-04: qty 1

## 2016-01-04 MED ORDER — LEVOFLOXACIN IN D5W 500 MG/100ML IV SOLN
500.0000 mg | INTRAVENOUS | Status: AC
  Administered 2016-01-04: 500 mg via INTRAVENOUS
  Filled 2016-01-04: qty 100

## 2016-01-04 MED ORDER — LEVOFLOXACIN IN D5W 500 MG/100ML IV SOLN: 500.0000 mg | INTRAVENOUS | Status: DC

## 2016-01-04 MED ORDER — BISACODYL 10 MG RE SUPP
10.0000 mg | Freq: Every day | RECTAL | Status: DC | PRN
  Administered 2016-01-06: 10 mg via RECTAL
  Filled 2016-01-04: qty 1

## 2016-01-04 NOTE — Progress Notes (Signed)
Paged MD. Orders for suppository, laxative, and stool softeners obtained, pt has no BM for several days. Also noted with fine crackles to bilateral lung sounds, requested to D/C IV fluids. Orders approved per Dr. Clint GuyHower.

## 2016-01-04 NOTE — Progress Notes (Signed)
Pt had new onset fever this afternoon, notified MD. Discharge cancelled pending CBC, U/A, chest xray. Tylenol given for fever effective, will continue to monitor.

## 2016-01-04 NOTE — Evaluation (Signed)
Clinical/Bedside Swallow Evaluation Patient Details  Name: Stacey Baker MRN: [id number] Date of Birth: 1932-01-25  Today's Date: 01/04/2016 Time: SLP Start Time (ACUTE ONLY): 1615 SLP Stop Time (ACUTE ONLY): 1715 SLP Time Calculation (min) (ACUTE ONLY): 60 min  Past Medical History:  Past Medical History  Diagnosis Date  . Stroke (HCC)   . Dementia   . Parkinson disease (HCC)   . Hypertension   . Hyperlipidemia   . Depression   . CVA (cerebral infarction)   . Encephalopathy    Past Surgical History:  Past Surgical History  Procedure Laterality Date  . Abdominal hysterectomy    . Cholecystectomy     HPI:  Pt is a 82 y.o. female with a known history of dementia, previous CVA, Parkinson's disease, hypertension, hyperlipidemia, depression, history of previous UTIs who presents to the hospital due to generalized weakness frequent falls and noted to be in acute renal failure. Patient says that she's been feeling increasingly weak over the past few days and has had frequent falls at her assisted living. She denies having any head trauma or loss of consciousness. She says she's been having some diarrhea over the past few days which has been associated with some nausea and vomiting. She also admits to poor by mouth intake over the past few days. Patient presented to the hospital was noted to be in acute renal failure with a creatinine of 2.4. Pt was febrile this AM. She is currently tolerating her diet ordered by MD; NSG reported toleration of meds w/ water.    Assessment / Plan / Recommendation Clinical Impression  Pt appeared to adequately tolerate trials of thin liquids and purees/soft solids w/ no overt s/s of aspiration noted; clear vocal quality noted b/t trials and no decline in respiratory status during/post trials. Oral phase appeared grossly wfl w/ trials. Pt fed self but required assistance at times sec. to RUE decreased grip, overall weakness in hands. Pt does have mild tremor in UEs  during movements/use(baseline Parkinson's Dis. dx). Pt appears at reduced risk for aspiration following general aspiration precautions; full tray setup at meals and adaptive utensils at [facility]. Though pt does have dxs of Dementia and Parkinson's Dis w/ previous CVA that can increase risk for dysphagia. Rec. meds in puree for easier swallowing if indicated. Pt does have baseline expressive Aphasia from previous CVA - pt was encouraged to use her strategies of slowing down/beginning again and describing word she cannot remember.     Aspiration Risk   (reduced following aspriation precautions)    Diet Recommendation  regular diet(meats chopped and moist); thin liquids; aspiration and reflux precautions; FULL Tray Setup at all meals.   Medication Administration: Whole meds with liquid (use puree is indicated)    Other  Recommendations Recommended Consults:  (Dietician) Oral Care Recommendations: Oral care BID;Staff/trained caregiver to provide oral care   Follow up Recommendations   (returning to NH)    Frequency and Duration            Prognosis Prognosis for Safe Diet Advancement: Good Barriers to Reach Goals: Cognitive deficits      Swallow Study   General Date of Onset: 12/31/15 HPI: Pt is a 82 y.o. female with a known history of dementia, previous CVA, Parkinson's disease, hypertension, hyperlipidemia, depression, history of previous UTIs who presents to the hospital due to generalized weakness frequent falls and noted to be in acute renal failure. Patient says that she's been feeling increasingly weak over the past few days and has  had frequent falls at her assisted living. She denies having any head trauma or loss of consciousness. She says she's been having some diarrhea over the past few days which has been associated with some nausea and vomiting. She also admits to poor by mouth intake over the past few days. Patient presented to the hospital was noted to be in acute renal failure with a  creatinine of 2.4. Pt was febrile this AM. She is currently tolerating her diet ordered by MD; NSG reported toleration of meds w/ water.  Type of Study: Bedside Swallow Evaluation Previous Swallow Assessment: none Diet Prior to this Study: Regular;Thin liquids Temperature Spikes Noted:  (100.1; wbc not elevated) Respiratory Status: Room air History of Recent Intubation: No Behavior/Cognition: Alert;Cooperative;Pleasant mood;Requires cueing;Distractible Oral Cavity Assessment: Within Functional Limits Oral Care Completed by SLP: Recent completion by staff Oral Cavity - Dentition: Adequate natural dentition Vision: Functional for self-feeding Self-Feeding Abilities: Able to feed self;Needs assist;Needs set up (has adaptive utensils at NH per pt) Patient Positioning: Upright in bed Baseline Vocal Quality: Normal;Low vocal intensity Volitional Cough: Strong Volitional Swallow: Able to elicit    Oral/Motor/Sensory Function Overall Oral Motor/Sensory Function: Within functional limits   Ice Chips Ice chips: Not tested   Thin Liquid Thin Liquid: Within functional limits Presentation: Straw;Self Fed;Cup (8 trials total; then multiple sips via straw)    Nectar Thick Nectar Thick Liquid: Not tested   Honey Thick Honey Thick Liquid: Not tested   Puree Puree: Within functional limits Presentation: Self Fed;Spoon (4 trials)   Solid   GO   Solid: Within functional limits Presentation: Self Fed;Spoon (8+ trials of fish and veggies)       Jerilynn SomKatherine Watson, MS, CCC-SLP  Watson,Katherine 01/04/2016,5:14 PM

## 2016-01-04 NOTE — Progress Notes (Signed)
PT Cancellation Note  Patient Details Name: Stacey ManchesterMiriam Baker MRN: [id number] DOB: 01/03/1932   Cancelled Treatment:    Reason Eval/Treat Not Completed: Medical issues which prohibited therapy.  Pt currently waiting imaging for head CT.  Will hold PT at this time.  Will re-attempt at a later date and time.  [name], SPT [name] 01/04/2016, 11:18 AM

## 2016-01-04 NOTE — Progress Notes (Signed)
Physical Therapy Treatment Patient Details Name: Stacey Baker MRN: [id number] DOB: Nov 17, 1931 Today's Date: 01/04/2016    History of Present Illness Stacey Baker  is a 82 y.o. female with a known history of dementia, previous CVA, Parkinson's disease, hypertension, hyperlipidemia, depression, history of previous UTIs who presents to the hospital due to generalized weakness frequent falls and noted to be in acute renal failure. Patient says that she's been feeling increasingly weak over the past few days and has had frequent falls at her assisted living. She denies having any head trauma or loss of consciousness. She says she's been having some diarrhea over the past few days which has been associated with some nausea and vomiting. She also admits to poor by mouth intake over the past few days. Patient presented to the hospital was noted to be in acute renal failure with a creatinine of 2.4. Medical record indicates she is wheelchair bound however pt states she transfers independently from bed to wheelchair at facility. She also ambulates approximately 15' at a time with physical therapy. Pt reports multiple falls over the last 12 months which is why her facility has kept her contained mostly to a wheelchair for mobility.     PT Comments    Pt was min assist for bed mobility.  Pt required extra time and VC's and tactile cues for UE, torso and LE during bed mobility.  Pt required min to mod assist for sit to stand transfers with RW.  Pt performed 5 sets of sit to stand with RW.  Pt displayed posterior lean when standing and utilized bed on posterior calves as extra support.  Pt required extra time and VC's and tactile cues for hand and foot placement during sit to stand.  Next session PT will attempt ambulation with RW.   Follow Up Recommendations  Home health PT continue PT at ALF.     Equipment Recommendations  None recommended by PT    Recommendations for Other Services       Precautions /  Restrictions Precautions Precautions: Fall Restrictions Weight Bearing Restrictions: No    Mobility  Bed Mobility Overal bed mobility: Needs Assistance Bed Mobility: Supine to Sit;Sit to Supine     Supine to sit: Min assist Sit to supine: Min assist   General bed mobility comments: Pt requires cues and use of bed rail. HOB elevated. Moves very slowly  Transfers Overall transfer level: Needs assistance Equipment used: Rolling walker (2 wheeled) Transfers: Sit to/from Stand (5 sets ) Sit to Stand: Min assist;Mod assist         General transfer comment: Pt with decreased anterior weight shifting and decreased LE strength. Cues for hand placement and safety.  Pt utilized bed on the posterior calves as extra support during sit to stand.  Ambulation/Gait                 Stairs            Wheelchair Mobility    Modified Rankin (Stroke Patients Only)       Balance Overall balance assessment: Needs assistance Sitting-balance support: Feet supported;Single extremity supported (bed rail ) Sitting balance-Leahy Scale: Fair     Standing balance support: Bilateral upper extremity supported (RW) Standing balance-Leahy Scale: Poor                      Cognition Arousal/Alertness: Awake/alert Behavior During Therapy: WFL for tasks assessed/performed Overall Cognitive Status: Within Functional Limits for tasks assessed  Exercises      General Comments   Nursing was contacted and cleared pt for physical therapy.  Pt was agreeable and tolerated session well.       Pertinent Vitals/Pain Pain Assessment: 0-10 Pain Score: 4  Pain Location: R knee and R lateral calf  Pain Descriptors / Indicators: Aching Pain Intervention(s): Limited activity within patient's tolerance;Monitored during session;Repositioned  See flow sheet for vitals.     Home Living Family/patient expects to be discharged to:: Assisted living              Home Equipment: Dan HumphreysWalker - 2 wheels;Wheelchair - manual Additional Comments[facility]     Prior Function            PT Goals (current goals can now be found in the care plan section) Acute Rehab PT Goals Patient Stated Goal: [facility] PT Goal Formulation: With patient/family Time For Goal Achievement: 01/16/16 Potential to Achieve Goals: Good Progress towards PT goals: Progressing toward goals    Frequency  Min 2X/week    PT Plan Current plan remains appropriate    Co-evaluation             End of Session Equipment Utilized During Treatment: Gait belt Activity Tolerance: Patient tolerated treatment well Patient left: in bed;with call bell/phone within reach;with bed alarm set     Time: 1531-1555 PT Time Calculation (min) (ACUTE ONLY): 24 min  Charges:                       G Codes:       Lyndel SafeGarrett Mirabelle Cyphers, SPT Lyndel SafeGarrett [place] 01/04/2016, 4:07 PM

## 2016-01-04 NOTE — Progress Notes (Signed)
Pt more alert this shift, answers questions appropriately, oriented x 4. CT scan negative for acute changes. Speech evaluated pt today, see notes. No changes at this time. Neuro consult recommended by SLP due to pt having trouble with words, states she "gets them mixed up since she fell". Relayed information to oncoming shift, will notify MD tomorrow.

## 2016-01-04 NOTE — Progress Notes (Signed)
[facility] [place] at [facility]                                                                                                                                                                                            Patient Demographics   Stacey Baker, is a 82 y.o. female, DOB - 02-Dec-1931, UEA:[id number]  Admit date - 12/31/2015   Admitting Physician [name], MD  Outpatient Primary MD for the patient is [facility], MARY BETH, PA-C   LOS - 4  Subjective: Patient is currently sleepy, and has some saliva particles on her gown Very drowsy currently. Had a fever again earlier this morning.    Review of Systems:   CONSTITUTIONAL: Positive fever  Vitals:   Filed Vitals:   01/03/16 1934 01/04/16 0421 01/04/16 0435 01/04/16 0720  BP: 142/50 126/43 146/62 135/50  Pulse: 67 65  62  Temp: 99.9 F (37.7 C) 100.7 F (38.2 C) 100 F (37.8 C) 98.8 F (37.1 C)  TempSrc: Axillary Axillary Axillary Axillary  Resp: Height:      Weight:      SpO2: 95% 91%  91%    Wt Readings from Last 3 Encounters:  12/31/15 66.225 kg (146 lb)  10/31/15 85.73 kg (189 lb)  09/13/15 65.318 kg (144 lb)     Intake/Output Summary (Last 24 hours) at 01/04/16 1032 Last data filed at 01/03/16 1700  Gross per 24 hour  Intake    240 ml  Output      0 ml  Net    240 ml    Physical Exam:   GENERAL: Pleasant-appearing currently very drowsy HEAD, EYES, EARS, NOSE AND THROAT: Atraumatic, normocephalic. Extraocular muscles are intact. Pupils equal and reactive to light. Sclerae anicteric. No conjunctival injection. No oro-pharyngeal erythema.  NECK: Supple. There is no jugular venous distention. No bruits, no lymphadenopathy, no thyromegaly.  HEART: Regular rate and rhythm,. No murmurs, no rubs, no clicks.  LUNGS:  ABDOMEN: Soft, flat, nontender, nondistended. Has good bowel sounds. No hepatosplenomegaly appreciated.  EXTREMITIES: No evidence of any cyanosis,  clubbing, or peripheral edema.  +2 pedal and radial pulses bilaterally.  NEUROLOGIC: Sleepy  SKIN: Moist and warm with no rashes appreciated.  Psych:Drowsy LN: No inguinal LN enlargement    Antibiotics   Anti-infectives    Start     Dose/Rate Route Frequency Ordered Stop   01/05/16 1000  levofloxacin (LEVAQUIN) tablet 250 mg     250 mg Oral Daily 01/04/16 0946     01/04/16 1000  levofloxacin (LEVAQUIN) tablet 500 mg  Status:  Discontinued  500 mg Oral Daily 01/04/16 0926 01/04/16 0946   01/04/16 1000  levofloxacin (LEVAQUIN) tablet 500 mg     500 mg Oral  Once 01/04/16 0946        Medications   Scheduled Meds: . amLODipine  10 mg Oral Daily  . antiseptic oral rinse  7 mL Mouth Rinse q12n4p  . aspirin EC  81 mg Oral Daily  . atorvastatin  10 mg Oral QHS  . carbidopa-levodopa  1 tablet Oral TID  . chlorhexidine  15 mL Mouth Rinse BID  . donepezil  5 mg Oral QHS  . escitalopram  10 mg Oral Daily  . ferrous sulfate  325 mg Oral BID  . levETIRAcetam  500 mg Oral BID  . [START ON 01/05/2016] levofloxacin  250 mg Oral Daily  . levofloxacin  500 mg Oral Once  . mirtazapine  15 mg Oral QHS  . QUEtiapine  25 mg Oral QHS  . vitamin B-12  1,000 mcg Oral Daily   Continuous Infusions: . sodium chloride 75 mL/hr at 01/04/16 0347   PRN Meds:.acetaminophen **OR** acetaminophen, ondansetron **OR** ondansetron (ZOFRAN) IV   Data Review:   Micro Results No results found for this or any previous visit (from the past 240 hour(s)).  Radiology Reports Ct Head Wo Contrast  12/31/2015  CLINICAL DATA:  Larey Seat today and hit head.  Left-sided hematoma. EXAM: CT HEAD WITHOUT CONTRAST CT CERVICAL SPINE WITHOUT CONTRAST TECHNIQUE: Multidetector CT imaging of the head and cervical spine was performed following the standard protocol without intravenous contrast. Multiplanar CT image reconstructions of the cervical spine were also generated. COMPARISON:  Multiple prior examinations. The most recent  is 08/27/2015 FINDINGS: CT HEAD FINDINGS Stable age related cerebral atrophy, ventriculomegaly and periventricular white matter disease. No extra-axial fluid collections are identified. No CT findings for acute hemispheric infarction or intracranial hemorrhage. No mass lesions. The brainstem and cerebellum are normal. No acute skull fracture is identified. The paranasal sinuses and mastoid air cells are clear. The globes are intact. The visualized facial bones are intact. CT CERVICAL SPINE FINDINGS Degenerative cervical spondylosis with multilevel disc disease and facet disease. No acute cervical spine fracture. No abnormal prevertebral soft tissue swelling. The facets are normally aligned. Facet fusions are noted. No facet or lamina fractures are identified. Stable right-sided thyroid goiter since 2015. The lung apices are grossly clear. Emphysematous changes are noted. IMPRESSION: 1. Stable age related cerebral atrophy, ventriculomegaly and periventricular white matter disease. 2. No acute intracranial findings or acute skull fracture. 3. Degenerative cervical spondylosis with multilevel disc disease and facet disease but no acute cervical spine fracture. Electronically Signed   By: Rudie Meyer M.D.   On: 12/31/2015 12:50   Ct Cervical Spine Wo Contrast  12/31/2015  CLINICAL DATA:  Larey Seat today and hit head.  Left-sided hematoma. EXAM: CT HEAD WITHOUT CONTRAST CT CERVICAL SPINE WITHOUT CONTRAST TECHNIQUE: Multidetector CT imaging of the head and cervical spine was performed following the standard protocol without intravenous contrast. Multiplanar CT image reconstructions of the cervical spine were also generated. COMPARISON:  Multiple prior examinations. The most recent is 08/27/2015 FINDINGS: CT HEAD FINDINGS Stable age related cerebral atrophy, ventriculomegaly and periventricular white matter disease. No extra-axial fluid collections are identified. No CT findings for acute hemispheric infarction or  intracranial hemorrhage. No mass lesions. The brainstem and cerebellum are normal. No acute skull fracture is identified. The paranasal sinuses and mastoid air cells are clear. The globes are intact. The visualized facial bones are intact. CT  CERVICAL SPINE FINDINGS Degenerative cervical spondylosis with multilevel disc disease and facet disease. No acute cervical spine fracture. No abnormal prevertebral soft tissue swelling. The facets are normally aligned. Facet fusions are noted. No facet or lamina fractures are identified. Stable right-sided thyroid goiter since 2015. The lung apices are grossly clear. Emphysematous changes are noted. IMPRESSION: 1. Stable age related cerebral atrophy, ventriculomegaly and periventricular white matter disease. 2. No acute intracranial findings or acute skull fracture. 3. Degenerative cervical spondylosis with multilevel disc disease and facet disease but no acute cervical spine fracture. Electronically Signed   By: Rudie Meyer M.D.   On: 12/31/2015 12:50   Dg Chest Port 1 View  01/03/2016  CLINICAL DATA:  Dementia.  Parkinson's disease.  Weakness and falls. EXAM: PORTABLE CHEST 1 VIEW COMPARISON:  09/13/2015 FINDINGS: The patient is rotated to the right on today's radiograph, reducing diagnostic sensitivity and specificity. Moderate hiatal hernia. Potential mild enlargement of the cardiopericardial silhouette. Suspected subsegmental atelectasis or scar at the right lung base. Some of this appearance may be due to prominent epicardial adipose tissue. The left lung appears clear. Emphysema noted. IMPRESSION: 1. Moderate hiatal hernia. 2. Suspected mild enlargement of the cardiopericardial silhouette. 3. Possible atelectasis at the right lung base. 4. Emphysema. Electronically Signed   By: Gaylyn Rong M.D.   On: 01/03/2016 13:57     CBC  Recent Labs Lab 12/31/15 1249 12/31/15 1757 01/01/16 0407 01/02/16 0434 01/03/16 1318  WBC 7.1 5.8 4.0 3.9 6.1  HGB 12.1  12.5 11.4* 11.1* 11.6*  HCT 36.3 37.7 34.0* 33.3* 34.9*  PLT 120* 118* 94* 99* 112*  MCV 90.7 90.6 92.0 90.7 89.1  MCH 30.2 29.9 30.8 30.2 29.7  MCHC 33.3 33.0 33.4 33.4 33.3  RDW 14.1 13.8 13.7 13.5 13.6  LYMPHSABS 1.2  --   --   --   --   MONOABS 0.6  --   --   --   --   EOSABS 0.1  --   --   --   --   BASOSABS 0.0  --   --   --   --     Chemistries   Recent Labs Lab 12/31/15 1424 12/31/15 1757 01/01/16 0407 01/02/16 0434 01/03/16 0434  NA 140 140 140 139 140  K 4.4 4.6 4.2 4.1 3.6  CL 111 111 115* 115* 113*  CO2 20* 20* 22 21* 23  GLUCOSE 96 109* 90 83 110*  BUN 48* 45* 39* 24* 16  CREATININE 2.41* 2.26* 1.88* 1.29* 1.13*  CALCIUM 8.5* 8.8* 8.1* 7.8* 8.4*  AST 41 43*  --   --   --   ALT 14 16  --   --   --   ALKPHOS 73 76  --   --   --   BILITOT 0.6 0.6  --   --   --    ------------------------------------------------------------------------------------------------------------------ estimated creatinine clearance is 35.7 mL/min (by C-G formula based on Cr of 1.13). ------------------------------------------------------------------------------------------------------------------ No results for input(s): HGBA1C in the last 72 hours. ------------------------------------------------------------------------------------------------------------------ No results for input(s): CHOL, HDL, LDLCALC, TRIG, CHOLHDL, LDLDIRECT in the last 72 hours. ------------------------------------------------------------------------------------------------------------------ No results for input(s): TSH, T4TOTAL, T3FREE, THYROIDAB in the last 72 hours.  Invalid input(s): FREET3 ------------------------------------------------------------------------------------------------------------------ No results for input(s): VITAMINB12, FOLATE, FERRITIN, TIBC, IRON, RETICCTPCT in the last 72 hours.  Coagulation profile No results for input(s): INR, PROTIME in the last 168 hours.  No results for  input(s): DDIMER in the last 72 hours.  Cardiac Enzymes  Recent Labs Lab 12/31/15 1424  TROPONINI 0.04*   ------------------------------------------------------------------------------------------------------------------ Invalid input(s): POCBNP    Assessment & Plan   82 year old female with multiple medical problems who is presenting with generalized weakness and acute renal failure  #1 fever-chest x-ray with possible aspiration pneumonia I will place her on IV Levaquin. Speech eval of   #2 acute renal failure-Resolve    #3 acuteencephalopathy check a CT scan of the head  #4 history of Parkinson's disease-continue Sinemet.  #5 depression-continue Remeron.  #6 dementia-continue Aricept, Seroquel.      Code Status Orders        Start     Ordered   12/31/15 1742  Full code   Continuous     12/31/15 1742    Code Status History    Date Active Date Inactive Code Status Order ID Comments User Context   09/13/2015 10:55 PM 09/15/2015  8:25 PM Full Code [id number]  Oralia Manisavid Willis, MD Inpatient   08/28/2015  8:14 AM 08/29/2015  4:49 PM DNR [id number]  Delfino LovettVipul Shah, MD Inpatient   08/27/2015 11:25 PM 08/28/2015  8:14 AM Full Code [id number]  Wyatt Hasteavid K Hower, MD ED           Consults   none  DVT Prophylaxis due to platelets dropping we'll stop her DVT prophylaxis with heparin  Lab Results  Component Value Date   PLT 112* 01/03/2016     Time Spent in minutes  25min  Auburn BilberryPATEL, Annika Selke M.D on 01/04/2016 at 10:32 AM  Between 7am to 6pm - Pager - [phone]  After 6pm go to [email or web address] - password [email or web address] [facility]  [facility]   Office  [phone]

## 2016-01-05 MED ORDER — LEVOFLOXACIN 500 MG PO TABS
250.0000 mg | ORAL_TABLET | Freq: Every day | ORAL | Status: DC
  Administered 2016-01-05 – 2016-01-06 (×2): 250 mg via ORAL
  Filled 2016-01-05 (×2): qty 1

## 2016-01-05 NOTE — Progress Notes (Signed)
PHARMACIST - PHYSICIAN COMMUNICATION  CONCERNING: Antibiotic IV to Oral Route Change Policy  RECOMMENDATION: This patient is receiving LEVOFLOXACIN  by the intravenous route.  Based on criteria approved by the Pharmacy and Therapeutics Committee, the antibiotic(s) is/are being converted to the equivalent oral dose form(s).   DESCRIPTION: These criteria include:  Patient being treated for a respiratory tract infection, urinary tract infection, cellulitis or clostridium difficile associated diarrhea if on metronidazole  The patient is not neutropenic and does not exhibit a GI malabsorption state  The patient is eating (either orally or via tube) and/or has been taking other orally administered medications for a least 24 hours  The patient is improving clinically and has a Tmax < 100.5  If you have questions about this conversion, please contact the Pharmacy Department  [phone]( [phone] )  [facility]  Cher NakaiSheema Dara Beidleman, PharmD Pharmacy Resident 01/05/2016

## 2016-01-05 NOTE — Progress Notes (Signed)
Pt doing well very pleasant and cooperative no complaints of pain. Took meds cas

## 2016-01-05 NOTE — Progress Notes (Signed)
Speech Language Pathology Treatment: Dysphagia  Patient Details Name: Stacey Baker MRN: [id number] DOB: 1932-08-07 Today's Date: 01/05/2016 Time: 0820-0900 SLP Time Calculation (min) (ACUTE ONLY): 40 min  Assessment / Plan / Recommendation Clinical Impression     HPI HPI: Pt is a 82 y.o. female with a known history of dementia, previous CVA, Parkinson's disease, hypertension, hyperlipidemia, depression, history of previous UTIs who presents to the hospital due to generalized weakness frequent falls and noted to be in acute renal failure. Patient says that she's been feeling increasingly weak over the past few days and has had frequent falls at her assisted living. She denies having any head trauma or loss of consciousness. She says she's been having some diarrhea over the past few days which has been associated with some nausea and vomiting. She also admits to poor by mouth intake over the past few days. Patient presented to the hospital was noted to be in acute renal failure with a creatinine of 2.4. Pt was febrile this AM. She is currently tolerating her diet ordered by MD; NSG reported toleration of meds w/ water.       SLP Plan  All goals met     Recommendations  Diet recommendations: Regular;Thin liquid Liquids provided via: Cup Medication Administration: Whole meds with liquid Supervision: Patient able to self feed (setup assist) Compensations: Minimize environmental distractions;Small sips/bites;Slow rate Postural Changes and/or Swallow Maneuvers: Seated upright 90 degrees             Oral Care Recommendations: Oral care BID;Staff/trained caregiver to provide oral care Follow up Recommendations: None Plan: All goals met     GO               Stacey Kenner, MS, CCC-SLP  [name] 01/05/2016, 3:16 PM

## 2016-01-05 NOTE — Progress Notes (Signed)
Patient ID: Stacey Baker, female   DOB: 08/21/1932, 82 y.o.   MRN: [id number] [facility] PRO[facility]  Stacey Baker WUJ:[id number] DOB: 09/08/1932 DOA: 12/31/2015 PCP: Odelia GageMCGRANAGHAN, MARY BETH, PA-C  HPI/Subjective: Patient feeling a little bit better today. She did have a fever last night. As per my associates note yesterday she was lethargic. Patient answering all questions today.  Objective: Filed Vitals:   01/05/16 0812 01/05/16 1428  BP: 150/50 118/52  Pulse: 69 60  Temp: 97.4 F (36.3 C) 98.9 F (37.2 C)  Resp: 18 18    Filed Weights   12/31/15 1148 12/31/15 1800  Weight: 73 kg (160 lb 15 oz) 66.225 kg (146 lb)    ROS: Review of Systems  Constitutional: Negative for fever and chills.  Eyes: Negative for blurred vision.  Respiratory: Positive for cough. Negative for shortness of breath.   Cardiovascular: Negative for chest pain.  Gastrointestinal: Negative for nausea, vomiting, abdominal pain, diarrhea and constipation.  Genitourinary: Negative for dysuria.  Musculoskeletal: Negative for joint pain.  Neurological: Positive for weakness. Negative for dizziness and headaches.   Exam: Physical Exam  Constitutional: She is oriented to person, place, and time.  HENT:  Nose: No mucosal edema.  Mouth/Throat: No oropharyngeal exudate or posterior oropharyngeal edema.  Eyes: Conjunctivae, EOM and lids are normal. Pupils are equal, round, and reactive to light.  Neck: No JVD present. Carotid bruit is not present. No edema present. No thyroid mass and no thyromegaly present.  Cardiovascular: S1 normal and S2 normal.  Exam reveals no gallop.   No murmur heard. Pulses:      Dorsalis pedis pulses are 2+ on the right side, and 2+ on the left side.  Respiratory: No respiratory distress. She has no wheezes. She has no rhonchi. She has no rales.  GI: Soft. Bowel sounds are normal. There is no tenderness.  Musculoskeletal:       Right ankle: She exhibits no swelling.        Left ankle: She exhibits no swelling.  Lymphadenopathy:    She has no cervical adenopathy.  Neurological: She is alert and oriented to person, place, and time. No cranial nerve deficit.  Skin: Skin is warm. No rash noted. Nails show no clubbing.  Psychiatric: She has a normal mood and affect.      Data Reviewed: Basic Metabolic Panel:  Recent Labs Lab 12/31/15 1424 12/31/15 1757 01/01/16 0407 01/02/16 0434 01/03/16 0434  NA 140 140 140 139 140  K 4.4 4.6 4.2 4.1 3.6  CL 111 111 115* 115* 113*  CO2 20* 20* 22 21* 23  GLUCOSE 96 109* 90 83 110*  BUN 48* 45* 39* 24* 16  CREATININE 2.41* 2.26* 1.88* 1.29* 1.13*  CALCIUM 8.5* 8.8* 8.1* 7.8* 8.4*   Liver Function Tests:  Recent Labs Lab 12/31/15 1424 12/31/15 1757  AST 41 43*  ALT 14 16  ALKPHOS 73 76  BILITOT 0.6 0.6  PROT 6.8 7.2  ALBUMIN 3.8 4.1   CBC:  Recent Labs Lab 12/31/15 1249 12/31/15 1757 01/01/16 0407 01/02/16 0434 01/03/16 1318  WBC 7.1 5.8 4.0 3.9 6.1  NEUTROABS 5.2  --   --   --   --   HGB 12.1 12.5 11.4* 11.1* 11.6*  HCT 36.3 37.7 34.0* 33.3* 34.9*  MCV 90.7 90.6 92.0 90.7 89.1  PLT 120* 118* 94* 99* 112*   Cardiac Enzymes:  Recent Labs Lab 12/31/15 1424  TROPONINI 0.04*   Studies: Ct Head Wo Contrast  01/04/2016  CLINICAL DATA:  Confusion, generalized weakness EXAM: CT HEAD WITHOUT CONTRAST TECHNIQUE: Contiguous axial images were obtained from the base of the skull through the vertex without intravenous contrast. COMPARISON:  12/31/2015 FINDINGS: There is atrophy and chronic small vessel disease changes. No acute intracranial abnormality. Specifically, no hemorrhage, hydrocephalus, mass lesion, acute infarction, or significant intracranial injury. No acute calvarial abnormality. Visualized paranasal sinuses and mastoids clear. Orbital soft tissues unremarkable. IMPRESSION: No acute intracranial abnormality. Atrophy, chronic microvascular disease. Electronically Signed   By: Charlett Nose  M.D.   On: 01/04/2016 13:27    Scheduled Meds: . amLODipine  10 mg Oral Daily  . aspirin EC  81 mg Oral Daily  . atorvastatin  10 mg Oral QHS  . carbidopa-levodopa  1 tablet Oral TID  . chlorhexidine  15 mL Mouth Rinse BID  . docusate sodium  100 mg Oral BID  . donepezil  5 mg Oral QHS  . escitalopram  10 mg Oral Daily  . ferrous sulfate  325 mg Oral BID  . levETIRAcetam  500 mg Oral BID  . levofloxacin  250 mg Oral Daily  . mirtazapine  15 mg Oral QHS  . QUEtiapine  25 mg Oral QHS  . vitamin B-12  1,000 mcg Oral Daily    Assessment/Plan:  1. Fever. Potential aspiration pneumonia. Chest x-ray read as negative. Patient empirically put on Levaquin. Patient more alert today. Would like to see the patient afebrile for at least 24 hours prior to making and disposition. 2. Acute renal failure. This has improved 3. Parkinson's disease with frequent falls. PT recommended back to assisted living with home PT. Patient states that she spends a lot of time in the wheelchair. Continue Sinemet 4. History of seizure on Keppra 5. Hyperlipidemia unspecified on atorvastatin 6. Essential hypertension on Norvasc  Code Status:     Code Status Orders        Start     Ordered   12/31/15 1742  Full code   Continuous     12/31/15 1742    Code Status History    Date Active Date Inactive Code Status Order ID Comments User Context   09/13/2015 10:55 PM 09/15/2015  8:25 PM Full Code [id number]  Oralia Manis, MD Inpatient   08/28/2015  8:14 AM 08/29/2015  4:49 PM DNR [id number]  Delfino Lovett, MD Inpatient   08/27/2015 11:25 PM 08/28/2015  8:14 AM Full Code [id number]  Wyatt Haste, MD ED     Family Communication: Spoke with son on phone Disposition Plan: Potentially back to assisted living tomorrow  Antibiotics:  Levaquin  Time spent: 25 minutes  Alford Highland  [facility]

## 2016-01-06 MED ORDER — LEVOFLOXACIN 250 MG PO TABS: 250.0000 mg | ORAL_TABLET | Freq: Every day | ORAL | Status: DC

## 2016-01-06 NOTE — Discharge Summary (Signed)
[facility] [place] [facility]   PATIENT NAME: Stacey ManchesterMiriam Baker    MR#:  [id number]  DATE OF BIRTH:  06/29/1932  DATE OF ADMISSION:  12/31/2015 ADMITTING PHYSICIAN: Houston SirenVivek J Sainani, MD  DATE OF DISCHARGE: 01/06/2016  PRIMARY CARE PHYSICIAN: Odelia GageMCGRANAGHAN, MARY BETH, PA-C    ADMISSION DIAGNOSIS:  Acute on chronic renal insufficiency (HCC) [N28.9, N18.9] Generalized weakness [R53.1] Fall, initial encounter [W19.XXXA]  DISCHARGE DIAGNOSIS:  Active Problems:   Acute renal failure (ARF) (HCC)   SECONDARY DIAGNOSIS:   Past Medical History  Diagnosis Date  . Stroke (HCC)   . Dementia   . Parkinson disease (HCC)   . Hypertension   . Hyperlipidemia   . Depression   . CVA (cerebral infarction)   . Encephalopathy     HOSPITAL COURSE:   1. Fever unspecified. Patient afebrile for over 24 hours now. Empiric Levaquin was given. Whether this was an aspiration or urine infection unclear. But either way patient's fevers improved. 2. Acute renal failure this has improved. Recommend stopping Lasix. 3. Parkinson's disease continue Sinemet 4. Dementia currently without behavioral disturbance. On Seroquel and Aricept.  5. Hyperlipidemia unspecified continue atorvastatin 6. Depression on Lexapro 7. Weakness- home health physical therapy consultation. Patient states that she is mostly wheelchair bound but occasionally does walk with a walker. 8. History of CVA on aspirin and statin 9. Elevated troponin is a false positive from acute renal failure  DISCHARGE CONDITIONS:   Fair  CONSULTS OBTAINED:  None  DRUG ALLERGIES:   Allergies  Allergen Reactions  . Penicillins Other (See Comments)    Reaction:  Unknown   . Plavix [Clopidogrel Bisulfate] Other (See Comments)    Reaction:  Unknown     DISCHARGE MEDICATIONS:   Current Discharge Medication List    START taking these medications   Details  levofloxacin (LEVAQUIN) 250 MG tablet Take 1 tablet (250  mg total) by mouth daily. Qty: 8 tablet, Refills: 0      CONTINUE these medications which have NOT CHANGED   Details  amLODipine (NORVASC) 10 MG tablet Take 1 tablet (10 mg total) by mouth daily. Qty: 30 tablet, Refills: 5    aspirin EC 81 MG tablet Take 81 mg by mouth daily.    atorvastatin (LIPITOR) 10 MG tablet Take 10 mg by mouth at bedtime.    carbidopa-levodopa (SINEMET IR) 25-100 MG tablet Take 1 tablet by mouth 3 (three) times daily.    diclofenac sodium (VOLTAREN) 1 % GEL Apply 2 g topically 2 (two) times daily. Pt applies to left knee.    donepezil (ARICEPT) 5 MG tablet Take 5 mg by mouth at bedtime.    escitalopram (LEXAPRO) 10 MG tablet Take 10 mg by mouth daily.    ferrous sulfate 325 (65 FE) MG tablet Take 325 mg by mouth 2 (two) times daily.    levETIRAcetam (KEPPRA) 500 MG tablet Take 500 mg by mouth 2 (two) times daily.    mirtazapine (REMERON) 15 MG tablet Take 15 mg by mouth at bedtime.    QUEtiapine (SEROQUEL) 25 MG tablet Take 25 mg by mouth at bedtime.    vitamin B-12 (CYANOCOBALAMIN) 1000 MCG tablet Take 1,000 mcg by mouth daily.      STOP taking these medications     furosemide (LASIX) 20 MG tablet      potassium chloride (K-DUR,KLOR-CON) 10 MEQ tablet      clindamycin (CLEOCIN) 300 MG capsule          DISCHARGE  INSTRUCTIONS:   Follow-up medical doctor this week  If you experience worsening of your admission symptoms, develop shortness of breath, life threatening emergency, suicidal or homicidal thoughts you must seek medical attention immediately by calling 911 or calling your MD immediately  if symptoms less severe.  You Must read complete instructions/literature along with all the possible adverse reactions/side effects for all the Medicines you take and that have been prescribed to you. Take any new Medicines after you have completely understood and accept all the possible adverse reactions/side effects.   Please note  You were cared  for by a hospitalist during your hospital stay. If you have any questions about your discharge medications or the care you received while you were in the hospital after you are discharged, you can call the unit and asked to speak with the hospitalist on call if the hospitalist that took care of you is not available. Once you are discharged, your primary care physician will handle any further medical issues. Please note that NO REFILLS for any discharge medications will be authorized once you are discharged, as it is imperative that you return to your primary care physician (or establish a relationship with a primary care physician if you do not have one) for your aftercare needs so that they can reassess your need for medications and monitor your lab values.    Today   CHIEF COMPLAINT:   Chief Complaint  Patient presents with  . Weakness    HISTORY OF PRESENT ILLNESS:  Stacey Baker  is a 82 y.o. female presented with weakness and altered mental status and found to be in acute renal failure   VITAL SIGNS:  Blood pressure 149/60, pulse 66, temperature 97.7 F (36.5 C), temperature source Oral, resp. rate 18, height  (1.6 m), weight 66.225 kg (146 lb), SpO2 93 %.    PHYSICAL EXAMINATION:  GENERAL:  82 y.o.-year-old patient lying in the bed with no acute distress.  EYES: Pupils equal, round, reactive to light and accommodation. No scleral icterus. Extraocular muscles intact.  HEENT: Head atraumatic, normocephalic. Oropharynx and nasopharynx clear.  NECK:  Supple, no jugular venous distention. No thyroid enlargement, no tenderness.  LUNGS: Normal breath sounds bilaterally, no wheezing, rales,rhonchi or crepitation. No use of accessory muscles of respiration.  CARDIOVASCULAR: S1, S2 normal. No murmurs, rubs, or gallops.  ABDOMEN: Soft, non-tender, non-distended. Bowel sounds present. No organomegaly or mass.  EXTREMITIES: No pedal edema, cyanosis, or clubbing.  NEUROLOGIC: Cranial  nerves II through XII are intact. Sensation intact. Gait not checked.  PSYCHIATRIC: The patient is alert.  SKIN: No obvious rash, lesion, or ulcer.   DATA REVIEW:   CBC  Recent Labs Lab 01/03/16 1318  WBC 6.1  HGB 11.6*  HCT 34.9*  PLT 112*    Chemistries   Recent Labs Lab 12/31/15 1757  01/03/16 0434  NA 140  < > 140  K 4.6  < > 3.6  CL 111  < > 113*  CO2 20*  < > 23  GLUCOSE 109*  < > 110*  BUN 45*  < > 16  CREATININE 2.26*  < > 1.13*  CALCIUM 8.8*  < > 8.4*  AST 43*  --   --   ALT 16  --   --   ALKPHOS 76  --   --   BILITOT 0.6  --   --   < > = values in this interval not displayed.  Cardiac Enzymes  Recent Labs Lab 12/31/15 1424  TROPONINI 0.04*    Microbiology Results  Results for orders placed or performed during the hospital encounter of 09/13/15  Culture, blood (routine x 2)     Status: None   Collection Time: 09/13/15  7:45 PM  Result Value Ref Range Status   Specimen Description BLOOD LEFT HAND  Final   Special Requests BOTTLES DRAWN AEROBIC AND ANAEROBIC  Final   Culture NO GROWTH 6 DAYS  Final   Report Status 09/19/2015 FINAL  Final  Culture, blood (routine x 2)     Status: None   Collection Time: 09/13/15  9:10 PM  Result Value Ref Range Status   Specimen Description BLOOD LEFT ANTECUBITAL  Final   Special Requests BOTTLES DRAWN AEROBIC AND ANAEROBIC  Final   Culture NO GROWTH 6 DAYS  Final   Report Status 09/19/2015 FINAL  Final    RADIOLOGY:  Ct Head Wo Contrast  01/04/2016  CLINICAL DATA:  Confusion, generalized weakness EXAM: CT HEAD WITHOUT CONTRAST TECHNIQUE: Contiguous axial images were obtained from the base of the skull through the vertex without intravenous contrast. COMPARISON:  12/31/2015 FINDINGS: There is atrophy and chronic small vessel disease changes. No acute intracranial abnormality. Specifically, no hemorrhage, hydrocephalus, mass lesion, acute infarction, or significant intracranial injury. No acute calvarial  abnormality. Visualized paranasal sinuses and mastoids clear. Orbital soft tissues unremarkable. IMPRESSION: No acute intracranial abnormality. Atrophy, chronic microvascular disease. Electronically Signed   By: Charlett Nose M.D.   On: 01/04/2016 13:27   Management plans discussed with the patient, family and they are in agreement.  CODE STATUS:     Code Status Orders        Start     Ordered   12/31/15 1742  Full code   Continuous     12/31/15 1742    Code Status History    Date Active Date Inactive Code Status Order ID Comments User Context   09/13/2015 10:55 PM 09/15/2015  8:25 PM Full Code [id number]  Oralia Manis, MD Inpatient   08/28/2015  8:14 AM 08/29/2015  4:49 PM DNR [id number]  Delfino Lovett, MD Inpatient   08/27/2015 11:25 PM 08/28/2015  8:14 AM Full Code [id number]  Wyatt Haste, MD ED      TOTAL TIME TAKING CARE OF THIS PATIENT: 35 minutes.    Alford Highland M.D on 01/06/2016 at 9:11 AM  Between 7am to 6pm - Pager - [phone]  After 6pm go to [email or web address] - password [email or web address] [facility]  [facility]  Office  [phone]  CC: Primary care physician; [name] BETH, PA-C

## 2016-01-06 NOTE — Final Progress Note (Signed)
Report called to nurse at [facility], awaiting nurse to come from assisting living and assess patient

## 2016-01-06 NOTE — Clinical Social Work Placement (Signed)
   CLINICAL SOCIAL WORK PLACEMENT  NOTE  Date:  01/06/2016  Patient Details  Name: Stacey Baker MRN: [id number] Date of Birth: 08/08/1932  Clinical Social Work is seeking post-discharge placement for this patient at the Assisted Living Facility level of care (*CSW will initial, date and re-position this form in  chart as items are completed):  No   Patient/family provided with [place] Clinical Social Work Department's list of facilities offering this level of care within the geographic area requested by the patient (or if unable, by the patient's family).  No   Patient/family informed of their freedom to choose among providers that offer the needed level of care, that participate in Medicare, Medicaid or managed care program needed by the patient, have an available bed and are willing to accept the patient.  No   Patient/family informed of [facility]'s ownership interest in [facility] and [facility], as well as of the fact that they are under no obligation to receive care at these facilities.  PASRR submitted to EDS on       PASRR number received on 01/01/16     Existing PASRR number confirmed on       FL2 transmitted to all facilities in geographic area requested by pt/family on 01/01/16     FL2 transmitted to all facilities within larger geographic area on       Patient informed that his/her managed care company has contracts with or will negotiate with certain facilities, including the following:            Patient/family informed of bed offers received.  Patient chooses bed at  [facility])     Physician recommends and patient chooses bed at  [facility])    Patient to be transferred to  [facility]) on 01/06/16.  Patient to be transferred to facility by  (EMS)     Patient family notified on 01/06/16 of transfer.  Name of family member notified:   Stacey Baker(Charles Sherod (son) [phone])     PHYSICIAN       Additional Comment:     _______________________________________________ Starr SinclairSamantha L Undrea Shipes, LCSW 01/06/2016, 2:01 PM

## 2016-01-06 NOTE — Progress Notes (Addendum)
CSW spoke with [place] at [facility] via phone at [phone]. Matt HolmesCalisha confirmed patient is a resident at [facility] and can return today. CSW faxed d/c summary and FL2 to [phone] [facility]. Matt Holmesalisha confirmed orders were received. D/c packet completed and placed next to patients chart. CSW provided nurse with number for report [phone] and point of contact for report at facility. Staff from [facility] came to assess patient at bedside. Approval was given for patient to return. Patient will be leaving by EMS. Patient son Cammy CopaCharles Laurel was notified of d/c at bedside. Both has agreed to d/c plan. [facility] RNCM  Lyn was notified of HH orders.   Sherryl MangesSamantha Breyden Jeudy, BSW, MSW, LCSWA Clinical Social Work Dept [phone]

## 2016-01-06 NOTE — Progress Notes (Signed)
Called EMS to transport patient to [facility].

## 2016-01-06 NOTE — Discharge Instructions (Signed)
DIET:  Cardiac diet  DISCHARGE CONDITION:  Stable  ACTIVITY:  Activity as tolerated  OXYGEN:  Home Oxygen: No.   Oxygen Delivery: room air  DISCHARGE LOCATION:  assited living    ADDITIONAL DISCHARGE INSTRUCTION:   If you experience worsening of your admission symptoms, develop shortness of breath, life threatening emergency, suicidal or homicidal thoughts you must seek medical attention immediately by calling 911 or calling your MD immediately  if symptoms less severe.  You Must read complete instructions/literature along with all the possible adverse reactions/side effects for all the Medicines you take and that have been prescribed to you. Take any new Medicines after you have completely understood and accpet all the possible adverse reactions/side effects.   Please note  You were cared for by a hospitalist during your hospital stay. If you have any questions about your discharge medications or the care you received while you were in the hospital after you are discharged, you can call the unit and asked to speak with the hospitalist on call if the hospitalist that took care of you is not available. Once you are discharged, your primary care physician will handle any further medical issues. Please note that NO REFILLS for any discharge medications will be authorized once you are discharged, as it is imperative that you return to your primary care physician (or establish a relationship with a primary care physician if you do not have one) for your aftercare needs so that they can reassess your need for medications and monitor your lab values.   Fever, Adult A fever is an increase in the body's temperature. It is often defined as a temperature of 100 F (38C) or higher. Short mild or moderate fevers often have no long-term effects. They also often do not need treatment. Moderate or high fevers may make you feel uncomfortable. Sometimes, they can also be a sign of a serious illness  or disease. The sweating that may happen with repeated fevers or fevers that last a while may also cause you to not have enough fluid in your body (dehydration). You can take your temperature with a thermometer to see if you have a fever. A measured temperature can change with:  Age.  Time of day.  Where the thermometer is placed:  Mouth (oral).  Rectum (rectal).  Ear (tympanic).  Underarm (axillary).  Forehead (temporal). HOME CARE Pay attention to any changes in your symptoms. Take these actions to help with your condition:  Take over-the-counter and prescription medicines only as told by your doctor. Follow the dosing instructions carefully.  If you were prescribed an antibiotic medicine, take it as told by your doctor. Do not stop taking the antibiotic even if you start to feel better.  Rest as needed.  Drink enough fluid to keep your pee (urine) clear or pale yellow.  Sponge yourself or bathe with room-temperature water as needed. This helps to lower your body temperature . Do not use ice water.  Do not wear too many blankets or heavy clothes. GET HELP IF:  You throw up (vomit).  You cannot eat or drink without throwing up.  You have watery poop (diarrhea).  It hurts when you pee.  Your symptoms do not get better with treatment.  You have new symptoms.  You feel very weak. GET HELP RIGHT AWAY IF:  You are short of breath or have trouble breathing.  You are dizzy or you pass out (faint).  You feel confused.  You have signs of not having  enough fluid in your body, such as:  A dry mouth.  Peeing less.  Looking pale.  You have very bad pain in your belly (abdomen).  You keep throwing up or having water poop.  You have a skin rash.  Your symptoms suddenly get worse.   This information is not intended to replace advice given to you by your health care provider. Make sure you discuss any questions you have with your health care provider.     Document Released: 07/15/2008 Document Revised: 06/27/2015 Document Reviewed: 11/30/2014 Elsevier Interactive Patient Education [employer]

## 2016-01-06 NOTE — Care Management Note (Signed)
Case Management Note  Patient Details  Name: Stacey Baker MRN: [id number] Date of Birth: 12/17/1931  Subjective/Objective:        A request for home health PT was called and faxed to [facility].             Action/Plan:   Expected Discharge Date:                  Expected Discharge Plan:     In-House Referral:     Discharge planning Services     Post Acute Care Choice:    Choice offered to:     DME Arranged:    DME Agency:     HH Arranged:    HH Agency:     Status of Service:     Medicare Important Message Given:  Yes Date Medicare IM Given:    Medicare IM give by:    Date Additional Medicare IM Given:    Additional Medicare Important Message give by:     If discussed at Long Length of Stay Meetings, dates discussed:    Additional Comments:  Yi Haugan A, RN 01/06/2016, 9:48 AM

## 2016-07-03 ENCOUNTER — Encounter: Payer: Self-pay | Admitting: Emergency Medicine

## 2016-07-03 ENCOUNTER — Observation Stay (HOSPITAL_BASED_OUTPATIENT_CLINIC_OR_DEPARTMENT_OTHER)
Admit: 2016-07-03 | Discharge: 2016-07-03 | Disposition: A | Discharge status: Home / Self Care | Payer: Medicare Other | Attending: Internal Medicine | Admitting: Internal Medicine

## 2016-07-03 ENCOUNTER — Observation Stay: Payer: Medicare Other

## 2016-07-03 ENCOUNTER — Emergency Department: Payer: Medicare Other

## 2016-07-03 ENCOUNTER — Observation Stay (HOSPITAL_BASED_OUTPATIENT_CLINIC_OR_DEPARTMENT_OTHER)
Admission: EM | Admit: 2016-07-03 | Discharge: 2016-07-05 | Disposition: A | Discharge status: Home / Self Care | Payer: Medicare Other | Source: Home / Self Care | Attending: Emergency Medicine | Admitting: Emergency Medicine

## 2016-07-03 DIAGNOSIS — I313 Pericardial effusion (noninflammatory): Secondary | ICD-10-CM | POA: Diagnosis present

## 2016-07-03 DIAGNOSIS — R531 Weakness: Secondary | ICD-10-CM

## 2016-07-03 DIAGNOSIS — G20 Parkinson's disease: Secondary | ICD-10-CM | POA: Diagnosis present

## 2016-07-03 DIAGNOSIS — I639 Cerebral infarction, unspecified: Secondary | ICD-10-CM

## 2016-07-03 DIAGNOSIS — E785 Hyperlipidemia, unspecified: Secondary | ICD-10-CM | POA: Diagnosis present

## 2016-07-03 DIAGNOSIS — I3139 Other pericardial effusion (noninflammatory): Secondary | ICD-10-CM | POA: Diagnosis present

## 2016-07-03 DIAGNOSIS — R001 Bradycardia, unspecified: Secondary | ICD-10-CM | POA: Diagnosis present

## 2016-07-03 DIAGNOSIS — D696 Thrombocytopenia, unspecified: Secondary | ICD-10-CM

## 2016-07-03 DIAGNOSIS — G459 Transient cerebral ischemic attack, unspecified: Secondary | ICD-10-CM | POA: Diagnosis present

## 2016-07-03 DIAGNOSIS — N183 Chronic kidney disease, stage 3 unspecified: Secondary | ICD-10-CM | POA: Diagnosis present

## 2016-07-03 DIAGNOSIS — E875 Hyperkalemia: Secondary | ICD-10-CM

## 2016-07-03 DIAGNOSIS — I10 Essential (primary) hypertension: Secondary | ICD-10-CM | POA: Diagnosis present

## 2016-07-03 DIAGNOSIS — N390 Urinary tract infection, site not specified: Secondary | ICD-10-CM

## 2016-07-03 DIAGNOSIS — N179 Acute kidney failure, unspecified: Secondary | ICD-10-CM

## 2016-07-03 HISTORY — DX: Transient cerebral ischemic attack, unspecified: G45.9

## 2016-07-03 HISTORY — DX: Bradycardia, unspecified: R00.1

## 2016-07-03 HISTORY — DX: Chronic kidney disease, stage 3 unspecified: N18.30

## 2016-07-03 HISTORY — DX: Pericardial effusion (noninflammatory): I31.3

## 2016-07-03 HISTORY — DX: Other pericardial effusion (noninflammatory): I31.39

## 2016-07-03 HISTORY — DX: Hyperkalemia: E87.5

## 2016-07-03 HISTORY — DX: Chronic kidney disease, stage 3 (moderate): N18.3

## 2016-07-03 LAB — COMPREHENSIVE METABOLIC PANEL
ALBUMIN: 4 g/dL (ref 3.5–5.0)
ALK PHOS: 101 U/L (ref 38–126)
ALT: 9 U/L — AB (ref 14–54)
ANION GAP: 7 (ref 5–15)
AST: 24 U/L (ref 15–41)
BILIRUBIN TOTAL: 0.5 mg/dL (ref 0.3–1.2)
BUN: 38 mg/dL — AB (ref 6–20)
CALCIUM: 8.4 mg/dL — AB (ref 8.9–10.3)
CO2: 22 mmol/L (ref 22–32)
Chloride: 114 mmol/L — ABNORMAL HIGH (ref 101–111)
Creatinine, Ser: 1.81 mg/dL — ABNORMAL HIGH (ref 0.44–1.00)
GFR calc Af Amer: 29 mL/min — ABNORMAL LOW (ref >60)
GFR calc non Af Amer: 25 mL/min — ABNORMAL LOW (ref >60)
GLUCOSE: 97 mg/dL (ref 65–99)
Potassium: 5.4 mmol/L — ABNORMAL HIGH (ref 3.5–5.1)
SODIUM: 143 mmol/L (ref 135–145)
Total Protein: 7.2 g/dL (ref 6.5–8.1)

## 2016-07-03 LAB — DIFFERENTIAL
BASOS ABS: 0 10*3/uL (ref 0–0.1)
Basophils Relative: 1 %
EOS PCT: 4 %
Eosinophils Absolute: 0.2 10*3/uL (ref 0–0.7)
LYMPHS ABS: 1.1 10*3/uL (ref 1.0–3.6)
LYMPHS PCT: 22 %
Monocytes Absolute: 0.4 10*3/uL (ref 0.2–0.9)
Monocytes Relative: 8 %
NEUTROS ABS: 3.3 10*3/uL (ref 1.4–6.5)
NEUTROS PCT: 65 %

## 2016-07-03 LAB — ECHOCARDIOGRAM COMPLETE
Height: 63 in
Weight: 2320 oz

## 2016-07-03 LAB — PROTIME-INR
INR: 1.2
Prothrombin Time: 15.3 seconds — ABNORMAL HIGH (ref 11.4–15.2)

## 2016-07-03 LAB — URINE DRUG SCREEN, QUALITATIVE (ARMC ONLY)
AMPHETAMINES, UR SCREEN: NOT DETECTED
Barbiturates, Ur Screen: NOT DETECTED
Benzodiazepine, Ur Scrn: NOT DETECTED
CANNABINOID 50 NG, UR ~~LOC~~: NOT DETECTED
COCAINE METABOLITE, UR ~~LOC~~: NOT DETECTED
MDMA (ECSTASY) UR SCREEN: NOT DETECTED
Methadone Scn, Ur: NOT DETECTED
Opiate, Ur Screen: NOT DETECTED
PHENCYCLIDINE (PCP) UR S: NOT DETECTED
TRICYCLIC, UR SCREEN: NOT DETECTED

## 2016-07-03 LAB — URINALYSIS COMPLETE WITH MICROSCOPIC (ARMC ONLY)
Bilirubin Urine: NEGATIVE
GLUCOSE, UA: NEGATIVE mg/dL
HGB URINE DIPSTICK: NEGATIVE
Ketones, ur: NEGATIVE mg/dL
NITRITE: NEGATIVE
PH: 5 (ref 5.0–8.0)
Protein, ur: NEGATIVE mg/dL
SPECIFIC GRAVITY, URINE: 1.012 (ref 1.005–1.030)

## 2016-07-03 LAB — GLUCOSE, CAPILLARY: Glucose-Capillary: 88 mg/dL (ref 65–99)

## 2016-07-03 LAB — CBC
HCT: 35.8 % (ref 35.0–47.0)
HEMOGLOBIN: 11.8 g/dL — AB (ref 12.0–16.0)
MCH: 28.7 pg (ref 26.0–34.0)
MCHC: 32.9 g/dL (ref 32.0–36.0)
MCV: 87.2 fL (ref 80.0–100.0)
PLATELETS: 122 10*3/uL — AB (ref 150–440)
RBC: 4.1 MIL/uL (ref 3.80–5.20)
RDW: 16.5 % — ABNORMAL HIGH (ref 11.5–14.5)
WBC: 5 10*3/uL (ref 3.6–11.0)

## 2016-07-03 LAB — ETHANOL

## 2016-07-03 LAB — APTT: APTT: 29 s (ref 24–36)

## 2016-07-03 LAB — TROPONIN I: Troponin I: <0.03 ng/mL (ref <0.03)

## 2016-07-03 MED ORDER — POLYETHYLENE GLYCOL 3350 17 G PO PACK: 17.0000 g | PACK | Freq: Every day | ORAL | Status: DC | PRN

## 2016-07-03 MED ORDER — ONDANSETRON HCL 4 MG/2ML IJ SOLN: 4.0000 mg | Freq: Four times a day (QID) | INTRAMUSCULAR | Status: DC | PRN

## 2016-07-03 MED ORDER — ALBUTEROL SULFATE (2.5 MG/3ML) 0.083% IN NEBU: 2.5000 mg | INHALATION_SOLUTION | RESPIRATORY_TRACT | Status: DC | PRN

## 2016-07-03 MED ORDER — DOCUSATE SODIUM 100 MG PO CAPS
100.0000 mg | ORAL_CAPSULE | Freq: Two times a day (BID) | ORAL | Status: DC
  Administered 2016-07-03 – 2016-07-05 (×5): 100 mg via ORAL
  Filled 2016-07-03 (×5): qty 1

## 2016-07-03 MED ORDER — ENOXAPARIN SODIUM 40 MG/0.4ML ~~LOC~~ SOLN
30.0000 mg | SUBCUTANEOUS | Status: DC
  Administered 2016-07-03: 30 mg via SUBCUTANEOUS
  Filled 2016-07-03: qty 0.4

## 2016-07-03 MED ORDER — ESCITALOPRAM OXALATE 10 MG PO TABS
10.0000 mg | ORAL_TABLET | Freq: Every day | ORAL | Status: DC
  Administered 2016-07-03 – 2016-07-05 (×3): 10 mg via ORAL
  Filled 2016-07-03 (×3): qty 1

## 2016-07-03 MED ORDER — SODIUM CHLORIDE 0.9 % IV SOLN: INTRAVENOUS | Status: DC

## 2016-07-03 MED ORDER — ASPIRIN EC 81 MG PO TBEC
81.0000 mg | DELAYED_RELEASE_TABLET | Freq: Every day | ORAL | Status: DC
  Administered 2016-07-04 – 2016-07-05 (×2): 81 mg via ORAL
  Filled 2016-07-03 (×2): qty 1

## 2016-07-03 MED ORDER — ATORVASTATIN CALCIUM 20 MG PO TABS
10.0000 mg | ORAL_TABLET | Freq: Every day | ORAL | Status: DC
  Administered 2016-07-03 – 2016-07-04 (×2): 10 mg via ORAL
  Filled 2016-07-03 (×2): qty 1

## 2016-07-03 MED ORDER — LEVETIRACETAM 500 MG PO TABS
500.0000 mg | ORAL_TABLET | Freq: Two times a day (BID) | ORAL | Status: DC
  Administered 2016-07-03 – 2016-07-05 (×5): 500 mg via ORAL
  Filled 2016-07-03 (×5): qty 1

## 2016-07-03 MED ORDER — ONDANSETRON HCL 4 MG PO TABS: 4.0000 mg | ORAL_TABLET | Freq: Four times a day (QID) | ORAL | Status: DC | PRN

## 2016-07-03 MED ORDER — AMLODIPINE BESYLATE 10 MG PO TABS
10.0000 mg | ORAL_TABLET | Freq: Every day | ORAL | Status: DC
  Administered 2016-07-05: 09:00:00 10 mg via ORAL
  Filled 2016-07-03: qty 1

## 2016-07-03 MED ORDER — CARBIDOPA-LEVODOPA 25-100 MG PO TABS
1.0000 | ORAL_TABLET | Freq: Three times a day (TID) | ORAL | Status: DC
  Administered 2016-07-03 – 2016-07-05 (×7): 1 via ORAL
  Filled 2016-07-03 (×9): qty 1

## 2016-07-03 MED ORDER — QUETIAPINE FUMARATE 25 MG PO TABS
25.0000 mg | ORAL_TABLET | Freq: Every day | ORAL | Status: DC
  Administered 2016-07-03 – 2016-07-04 (×2): 25 mg via ORAL
  Filled 2016-07-03 (×2): qty 1

## 2016-07-03 MED ORDER — ACETAMINOPHEN 650 MG RE SUPP: 650.0000 mg | Freq: Four times a day (QID) | RECTAL | Status: DC | PRN

## 2016-07-03 MED ORDER — DONEPEZIL HCL 5 MG PO TABS
5.0000 mg | ORAL_TABLET | Freq: Every day | ORAL | Status: DC
  Administered 2016-07-03 – 2016-07-04 (×2): 5 mg via ORAL
  Filled 2016-07-03 (×2): qty 1

## 2016-07-03 MED ORDER — SODIUM BICARBONATE 8.4 % IV SOLN
50.0000 meq | Freq: Once | INTRAVENOUS | Status: AC
  Administered 2016-07-03: 50 meq via INTRAVENOUS
  Filled 2016-07-03: qty 50

## 2016-07-03 MED ORDER — DICLOFENAC SODIUM 1 % TD GEL
2.0000 g | Freq: Two times a day (BID) | TRANSDERMAL | Status: DC
  Administered 2016-07-03 – 2016-07-05 (×3): 2 g via TOPICAL
  Filled 2016-07-03: qty 100

## 2016-07-03 MED ORDER — SODIUM CHLORIDE 0.9% FLUSH: 3.0000 mL | INTRAVENOUS | Status: DC | PRN

## 2016-07-03 MED ORDER — SODIUM CHLORIDE 0.9 % IV SOLN: 250.0000 mL | INTRAVENOUS | Status: DC | PRN

## 2016-07-03 MED ORDER — FUROSEMIDE 20 MG PO TABS
60.0000 mg | ORAL_TABLET | Freq: Once | ORAL | Status: AC
  Administered 2016-07-03: 16:00:00 60 mg via ORAL
  Filled 2016-07-03: qty 1

## 2016-07-03 MED ORDER — ASPIRIN 81 MG PO CHEW
324.0000 mg | CHEWABLE_TABLET | Freq: Once | ORAL | Status: AC
  Administered 2016-07-03: 324 mg via ORAL
  Filled 2016-07-03: qty 4

## 2016-07-03 MED ORDER — FERROUS SULFATE 325 (65 FE) MG PO TABS
325.0000 mg | ORAL_TABLET | Freq: Two times a day (BID) | ORAL | Status: DC
  Administered 2016-07-03 – 2016-07-05 (×5): 325 mg via ORAL
  Filled 2016-07-03 (×5): qty 1

## 2016-07-03 MED ORDER — SODIUM CHLORIDE 0.9% FLUSH: 3.0000 mL | Freq: Two times a day (BID) | INTRAVENOUS | Status: DC

## 2016-07-03 MED ORDER — SODIUM CHLORIDE 0.9 % IV BOLUS (SEPSIS)
1000.0000 mL | Freq: Once | INTRAVENOUS | Status: AC
  Administered 2016-07-03: 1000 mL via INTRAVENOUS

## 2016-07-03 MED ORDER — SODIUM CHLORIDE 0.9% FLUSH
3.0000 mL | Freq: Two times a day (BID) | INTRAVENOUS | Status: DC
  Administered 2016-07-03 – 2016-07-05 (×5): 3 mL via INTRAVENOUS

## 2016-07-03 MED ORDER — MIRTAZAPINE 15 MG PO TABS
15.0000 mg | ORAL_TABLET | Freq: Every day | ORAL | Status: DC
  Administered 2016-07-03 – 2016-07-04 (×2): 15 mg via ORAL
  Filled 2016-07-03 (×2): qty 1

## 2016-07-03 MED ORDER — ACETAMINOPHEN 325 MG PO TABS: 650.0000 mg | ORAL_TABLET | Freq: Four times a day (QID) | ORAL | Status: DC | PRN

## 2016-07-03 NOTE — ED Triage Notes (Signed)
0935: Pt here via EMS from assisted living facility with c/o "feeling like she was having another stroke." Pt states she received help getting dressed at 0800, and had no concern of stroke at that time, then at 0830, while eating breakfast, she became "shakey in her hands", dropping her utensils and became worried she was having a stroke. Pt appears in no distress, is falling to the right side in the bed, which pt states is new for her. Speech clear, however, pt has hx of dementia, so conversation trails off at times. Grips weak bilaterally, no arm drift noted, some mild drift in left leg noted, however, pt states that leg is weak from her previous stroke.

## 2016-07-03 NOTE — Progress Notes (Signed)
Per Dr Elpidio AnisSudini discontinue NIH and neuro checks

## 2016-07-03 NOTE — ED Notes (Signed)
Patient transported to Ultrasound 

## 2016-07-03 NOTE — ED Notes (Signed)
Patient transported to MRI 

## 2016-07-03 NOTE — Consult Note (Signed)
Reason for Consult:R sided weakness  Referring Physician: Dr. Elpidio Anis  CC: R sided weakness   HPI: Stacey Baker is an 82 y.o. female  with a known history of CVA, expressive aphasia, hypertension, dementia, Parkinson's disease presents to the emergency room from local nursing home due to tremors and worsening right-sided weakness. Patient was found to be slumped onto the right. She does have right-sided weakness from old stroke. Patient is a poor historian. CT scan of the head showed no  Acute abnormality.      Past Medical History:  Diagnosis Date  . CVA (cerebral infarction)   . Dementia   . Depression   . Encephalopathy   . Hyperlipidemia   . Hypertension   . Parkinson disease (HCC)   . Stroke [facility])     Past Surgical History:  Procedure Laterality Date  . ABDOMINAL HYSTERECTOMY    . CHOLECYSTECTOMY      Family History  Problem Relation Age of Onset  . Heart disease Mother   . Hypertension Other     Social History:  reports that she has never smoked. She has never used smokeless tobacco. She reports that she does not drink alcohol or use drugs.  Allergies  Allergen Reactions  . Penicillins Other (See Comments)    Reaction:  Unknown   . Plavix [Clopidogrel Bisulfate] Other (See Comments)    Reaction:  Unknown     Medications: I have reviewed the patient's current medications.  ROS: History obtained from the patient  General ROS: negative for - chills, fatigue, fever, night sweats, weight gain or weight loss Psychological ROS: negative for - behavioral disorder, hallucinations, memory difficulties, mood swings or suicidal ideation Ophthalmic ROS: negative for - blurry vision, double vision, eye pain or loss of vision ENT ROS: negative for - epistaxis, nasal discharge, oral lesions, sore throat, tinnitus or vertigo Allergy and Immunology ROS: negative for - hives or itchy/watery eyes Hematological and Lymphatic ROS: negative for - bleeding problems, bruising or  swollen lymph nodes Endocrine ROS: negative for - galactorrhea, hair pattern changes, polydipsia/polyuria or temperature intolerance Respiratory ROS: negative for - cough, hemoptysis, shortness of breath or wheezing Cardiovascular ROS: negative for - chest pain, dyspnea on exertion, edema or irregular heartbeat Gastrointestinal ROS: negative for - abdominal pain, diarrhea, hematemesis, nausea/vomiting or stool incontinence Genito-Urinary ROS: negative for - dysuria, hematuria, incontinence or urinary frequency/urgency Musculoskeletal ROS: negative for - joint swelling or muscular weakness Neurological ROS: as noted in HPI Dermatological ROS: negative for rash and skin lesion changes  Physical Examination: Blood pressure 126/60, pulse (!) 48, temperature 98.2 F (36.8 C), temperature source Oral, resp. rate (!) 9, height 5\' 3"  (1.6 m), weight 65.8 kg (145 lb), SpO2 99 %.   Neurological Examination Mental Status: Alert, oriented, Cranial Nerves: II: Discs flat bilaterally; Visual fields grossly normal, pupils equal, round, reactive to light and accommodation III,IV, VI: ptosis not present, extra-ocular motions intact bilaterally V,VII: smile symmetric, facial light touch sensation normal bilaterally VIII: hearing normal bilaterally IX,X: gag reflex present XI: bilateral shoulder shrug XII: midline tongue extension Motor: Right : Upper extremity   4/5    Left:     Upper extremity   4/5  Lower extremity   3/5     Lower extremity   3/5 Tone and bulk:normal tone throughout; no atrophy noted Sensory: Pinprick and light touch intact throughout, bilaterally Deep Tendon Reflexes: 2+ and symmetric throughout Plantars: Right: downgoing   Left: downgoing Cerebellar: normal finger-to-nose, normal rapid alternating movements and  normal heel-to-shin test Gait: not tested       Laboratory Studies:   Basic Metabolic Panel:  Recent Labs Lab 07/03/16 0947  NA 143  K 5.4*  CL 114*  CO2  22  GLUCOSE 97  BUN 38*  CREATININE 1.81*  CALCIUM 8.4*    Liver Function Tests:  Recent Labs Lab 07/03/16 0947  AST 24  ALT 9*  ALKPHOS 101  BILITOT 0.5  PROT 7.2  ALBUMIN 4.0   No results for input(s): LIPASE, AMYLASE in the last 168 hours. No results for input(s): AMMONIA in the last 168 hours.  CBC:  Recent Labs Lab 07/03/16 0947  WBC 5.0  NEUTROABS 3.3  HGB 11.8*  HCT 35.8  MCV 87.2  PLT 122*    Cardiac Enzymes:  Recent Labs Lab 07/03/16 0947  TROPONINI <0.03    BNP: Invalid input(s): POCBNP  CBG:  Recent Labs Lab 07/03/16 0946  GLUCAP 88    Microbiology: Results for orders placed or performed during the hospital encounter of 09/13/15  Culture, blood (routine x 2)     Status: None   Collection Time: 09/13/15  7:45 PM  Result Value Ref Range Status   Specimen Description BLOOD LEFT HAND  Final   Special Requests BOTTLES DRAWN AEROBIC AND ANAEROBIC 5ML  Final   Culture NO GROWTH 6 DAYS  Final   Report Status 09/19/2015 FINAL  Final  Culture, blood (routine x 2)     Status: None   Collection Time: 09/13/15  9:10 PM  Result Value Ref Range Status   Specimen Description BLOOD LEFT ANTECUBITAL  Final   Special Requests BOTTLES DRAWN AEROBIC AND ANAEROBIC 5ML  Final   Culture NO GROWTH 6 DAYS  Final   Report Status 09/19/2015 FINAL  Final    Coagulation Studies:  Recent Labs  07/03/16 1033  LABPROT 15.3*  INR 1.20    Urinalysis: No results for input(s): COLORURINE, LABSPEC, PHURINE, GLUCOSEU, HGBUR, BILIRUBINUR, KETONESUR, PROTEINUR, UROBILINOGEN, NITRITE, LEUKOCYTESUR in the last 168 hours.  Invalid input(s): APPERANCEUR  Lipid Panel:     Component Value Date/Time   CHOL 130 08/28/2015 1124   CHOL 157 05/04/2014 0453   TRIG 37 08/28/2015 1124   TRIG 52 05/04/2014 0453   HDL 61 08/28/2015 1124   HDL 79 (H) 05/04/2014 0453   CHOLHDL 2.1 08/28/2015 1124   VLDL 7 08/28/2015 1124   VLDL 10 05/04/2014 0453   LDLCALC 62  08/28/2015 1124   LDLCALC 68 05/04/2014 0453    HgbA1C:  Lab Results  Component Value Date   HGBA1C 5.1 08/28/2015    Urine Drug Screen:     Component Value Date/Time   LABOPIA NONE DETECTED 08/28/2015 0228   COCAINSCRNUR NONE DETECTED 08/28/2015 0228   LABBENZ NONE DETECTED 08/28/2015 0228   AMPHETMU NONE DETECTED 08/28/2015 0228   THCU NONE DETECTED 08/28/2015 0228   LABBARB NONE DETECTED 08/28/2015 0228    Alcohol Level:  Recent Labs Lab 07/03/16 0947  ETH <5     Imaging: Ct Head Wo Contrast  Result Date: 07/03/2016 CLINICAL DATA:  Code stroke. Mental status changes beginning 1.5 hours ago. EXAM: CT HEAD WITHOUT CONTRAST TECHNIQUE: Contiguous axial images were obtained from the base of the skull through the vertex without intravenous contrast. COMPARISON:  12/31/2015 FINDINGS: Brain: The brain shows generalized atrophy. There chronic small-vessel ischemic changes of the deep and subcortical white matter. No sign of acute infarction, mass lesion, hemorrhage, hydrocephalus or extra-axial collection. No hyperdense vessels. Vascular: No  hyperdense vessels. Skull: Normal Sinuses/Orbits: Clear Other: None significant IMPRESSION: Atrophy and chronic small-vessel ischemic changes. No acute finding. These results were called by telephone at the time of interpretation on 07/03/2016 at 10:10 am to Dr. Nita Sickle , who verbally acknowledged these results. Electronically Signed   By: Paulina Fusi M.D.   On: 07/03/2016 10:13   US Carotid Bilateral  Result Date: 07/03/2016 CLINICAL DATA:  CVA. EXAM: BILATERAL CAROTID DUPLEX ULTRASOUND TECHNIQUE: Wallace Cullens scale imaging, color Doppler and duplex ultrasound were performed of bilateral carotid and vertebral arteries in the neck. COMPARISON:  08/28/2015 FINDINGS: Criteria: Quantification of carotid stenosis is based on velocity parameters that correlate the residual internal carotid diameter with NASCET-based stenosis levels, using the diameter of  the distal internal carotid lumen as the denominator for stenosis measurement. The following velocity measurements were obtained: RIGHT ICA:  110 cm/sec CCA:  86 cm/sec SYSTOLIC ICA/CCA RATIO:  1.3 DIASTOLIC ICA/CCA RATIO:  0.7 ECA:  72 cm/sec LEFT ICA:  79 cm/sec CCA:  68 cm/sec SYSTOLIC ICA/CCA RATIO:  1.2 DIASTOLIC ICA/CCA RATIO:  1.6 ECA:  55 cm/sec RIGHT CAROTID ARTERY: Small amount of echogenic plaque in the distal common carotid artery and bulb. Small amount of echogenic plaque in the proximal internal carotid artery without significant stenosis. Normal waveforms and velocities in the right internal carotid artery. RIGHT VERTEBRAL ARTERY: Antegrade flow and normal waveform in the right vertebral artery. LEFT CAROTID ARTERY: Small amount of echogenic plaque at the left carotid bulb. Small amount of echogenic plaque in the proximal internal carotid artery. Normal waveforms and velocities in left internal carotid artery. LEFT VERTEBRAL ARTERY: Antegrade flow and normal waveform in the left vertebral artery. IMPRESSION: Mild atherosclerotic disease in the carotid arteries bilaterally. Estimated degree of stenosis in the internal carotid arteries is less than 50% bilaterally. Patent vertebral arteries with antegrade flow. Electronically Signed   By: Richarda Overlie M.D.   On: 07/03/2016 12:21     Assessment/Plan:   82 y.o. female  with a known history of CVA, expressive aphasia, hypertension, dementia, Parkinson's disease presents to the emergency room from local nursing home due to tremors and worsening right-sided weakness. Patient was found to be slumped onto the right. She does have right-sided weakness from old stroke. Patient is a poor historian. CT scan of the head showed no  Acute abnormality.    On my examination pt had significant b/l LE weakness and she ambulated with a cane. No clear weakness on R side that has worsened Stroke work up with MRI/MRA Con't anti platelet and statin therapy   Pt/OT Likely d/c tomorrow  07/03/2016, 1:46 PM

## 2016-07-03 NOTE — ED Notes (Signed)
Pt taken from MRI to floor by house supervisor Annice PihJackie

## 2016-07-03 NOTE — ED Notes (Signed)
Code  Stroke  Called  To 333 

## 2016-07-03 NOTE — ED Notes (Signed)
Soc  Done  Paperwork  Given  To  md 

## 2016-07-03 NOTE — ED Notes (Signed)
Pt returned from US, resting in bed 

## 2016-07-03 NOTE — ED Notes (Signed)
Pt resting in bed, pt awake and alert to self, watching TV, pt denies any needs

## 2016-07-03 NOTE — ED Notes (Signed)
Pt resting in bed, pt awake and alert in no acute distress 

## 2016-07-03 NOTE — ED Provider Notes (Signed)
[facility] Emergency Department Provider Note  ____________________________________________  Time seen: Approximately 9:37 AM  I have reviewed the triage vital signs and the nursing notes.   HISTORY  Chief Complaint Code Stroke  Level 5 caveat:  Portions of the history and physical were unable to be obtained due to dementia   HPI Stacey Baker is a 82 y.o. female with a history of dementia, CVA on aspirin, Parkinson's disease, hypertension, hyperlipidemia who presents for evaluation of possible stroke. Patient reports that she was walking in her skilled nursing facility when she started falling to the right. This happened at 8:30 AM this morning. This was similar to her prior stroke so she came to the nurse's station and told them she felt she was having a stroke. Per EMS facility was unable to tell them if patient has any deficits from prior strokes at baseline. Patient has dementia and is unable to tell me if she had prior deficits. All she is able to tell me is that she was falling to the right and that usually not normal for her and she has had these similar symptoms with prior strokes. Per EMS patient's blood glucose was 108, she had a noticeable right-sided facial droop and also weakness on the right side.  Past Medical History:  Diagnosis Date  . CVA (cerebral infarction)   . Dementia   . Depression   . Encephalopathy   . Hyperlipidemia   . Hypertension   . Parkinson disease (HCC)   . Stroke [facility])     Patient Active Problem List   Diagnosis Date Noted  . Acute renal failure (ARF) (HCC) 12/31/2015  . Chronic renal insufficiency 09/15/2015  . Bradycardia 09/15/2015  . Cellulitis 09/13/2015  . Parkinson disease (HCC) 09/13/2015  . HTN (hypertension) 09/13/2015  . HLD (hyperlipidemia) 09/13/2015  . Depression 09/13/2015  . Dementia 09/13/2015  . Hyperkalemia 09/13/2015  . TIA (transient ischemic attack) 08/27/2015    Past Surgical History:    Procedure Laterality Date  . ABDOMINAL HYSTERECTOMY    . CHOLECYSTECTOMY      Prior to Admission medications   Medication Sig Start Date End Date Taking? Authorizing Provider  amLODipine (NORVASC) 10 MG tablet Take 1 tablet (10 mg total) by mouth daily. 09/15/15  Yes Katharina Caper, MD  aspirin EC 81 MG tablet Take 81 mg by mouth daily.   Yes Historical Provider, MD  atorvastatin (LIPITOR) 10 MG tablet Take 10 mg by mouth at bedtime.   Yes Historical Provider, MD  carbidopa-levodopa (SINEMET IR) 25-100 MG tablet Take 1 tablet by mouth 3 (three) times daily.   Yes Historical Provider, MD  diclofenac sodium (VOLTAREN) 1 % GEL Apply 2 g topically 2 (two) times daily. Pt applies to left knee.   Yes Historical Provider, MD  donepezil (ARICEPT) 5 MG tablet Take 5 mg by mouth at bedtime.   Yes Historical Provider, MD  escitalopram (LEXAPRO) 10 MG tablet Take 10 mg by mouth daily.   Yes Historical Provider, MD  ferrous sulfate 325 (65 FE) MG tablet Take 325 mg by mouth 2 (two) times daily.   Yes Historical Provider, MD  levETIRAcetam (KEPPRA) 500 MG tablet Take 500 mg by mouth 2 (two) times daily.   Yes Historical Provider, MD  mirtazapine (REMERON) 15 MG tablet Take 15 mg by mouth at bedtime.   Yes Historical Provider, MD  QUEtiapine (SEROQUEL) 25 MG tablet Take 25 mg by mouth at bedtime.   Yes Historical Provider, MD  vitamin  B-12 (CYANOCOBALAMIN) 1000 MCG tablet Take 1,000 mcg by mouth daily.   Yes Historical Provider, MD  levofloxacin (LEVAQUIN) 250 MG tablet Take 1 tablet (250 mg total) by mouth daily. Patient not taking: Reported on 07/03/2016 01/06/16   Alford Highlandichard Wieting, MD    Allergies Penicillins and Plavix [clopidogrel bisulfate]  Family History  Problem Relation Age of Onset  . Heart disease Mother   . Hypertension Other     Social History Social History  Substance Use Topics  . Smoking status: Never Smoker  . Smokeless tobacco: Never Used  . Alcohol use No    Review of  Systems  Constitutional: Negative for fever. Eyes: Negative for visual changes. ENT: Negative for sore throat. Cardiovascular: Negative for chest pain. Respiratory: Negative for shortness of breath. Gastrointestinal: Negative for abdominal pain, vomiting or diarrhea. Genitourinary: Negative for dysuria. Musculoskeletal: Negative for back pain. Skin: Negative for rash. Neurological: + falling to the right  ____________________________________________   PHYSICAL EXAM:  VITAL SIGNS: 98.2 F (36.8 C) Oral  22 -- 99 % 2 L/min  48 Monitor --  147/70     Constitutional: Sleepy but arousable, answering questions. No distress.  HEENT:      Head: Normocephalic and atraumatic.         Eyes: Conjunctivae are normal. Sclera is non-icteric. EOMI. PERRL      Mouth/Throat: Mucous membranes are moist.       Neck: Supple with no signs of meningismus. Cardiovascular: Regular rate and rhythm. No murmurs, gallops, or rubs. 2+ symmetrical distal pulses are present in all extremities. No JVD. Respiratory: Normal respiratory effort. Lungs are clear to auscultation bilaterally. No wheezes, crackles, or rhonchi.  Gastrointestinal: Soft, non tender, and non distended with positive bowel sounds. No rebound or guarding. Genitourinary: No CVA tenderness. Musculoskeletal: Nontender with normal range of motion in all extremities. No edema, cyanosis, or erythema of extremities. Neurologic: Normal speech and language. A & O x1, PERRL, no nystagmus, R facial droop otherwise CN II-XII intact, intact strength on b/l UE, 4/5 on RLE and 3/5 on LLE. There is no evidence of pronator drift or dysmetria on FNF.  Sensory examination is intact. Gait instability and patient falls to the R Skin: Skin is warm, dry and intact. No rash noted.   ____________________________________________   LABS (all labs ordered are listed, but only abnormal results are displayed)  Labs Reviewed  CBC - Abnormal; Notable for the  following:       Result Value   Hemoglobin 11.8 (*)    RDW 16.5 (*)    Platelets 122 (*)    All other components within normal limits  COMPREHENSIVE METABOLIC PANEL - Abnormal; Notable for the following:    Potassium 5.4 (*)    Chloride 114 (*)    BUN 38 (*)    Creatinine, Ser 1.81 (*)    Calcium 8.4 (*)    ALT 9 (*)    GFR calc non Af Amer 25 (*)    GFR calc Af Amer 29 (*)    All other components within normal limits  PROTIME-INR - Abnormal; Notable for the following:    Prothrombin Time 15.3 (*)    All other components within normal limits  URINALYSIS COMPLETEWITH MICROSCOPIC ([facility] ONLY) - Abnormal; Notable for the following:    Color, Urine STRAW (*)    APPearance CLEAR (*)    Leukocytes, UA TRACE (*)    Bacteria, UA RARE (*)    Squamous Epithelial / LPF 0-5 (*)  All other components within normal limits  URINE CULTURE  ETHANOL  DIFFERENTIAL  TROPONIN I  GLUCOSE, CAPILLARY  APTT  URINE DRUG SCREEN, QUALITATIVE ([facility] ONLY)  BASIC METABOLIC PANEL  CBC   ____________________________________________  EKG  ED ECG REPORT I, Nita Sickle, the attending physician, personally viewed and interpreted this ECG.  Sinus bradycardia, rate of 48, normal intervals, normal axis, ST elevation concave up on inferior leads which is unchanged from prior with no reciprocal changes. ____________________________________________  RADIOLOGY  Head CT; Negative  ____________________________________________   PROCEDURES  Procedure(s) performed: None Procedures Critical Care performed:  None ____________________________________________   INITIAL IMPRESSION / ASSESSMENT AND PLAN / ED COURSE  82 y.o. female with a history of dementia, CVA on aspirin, Parkinson's disease, hypertension, hyperlipidemia who presents for evaluation of possible stroke with weakness on the right side and right facial droop that started at 8:30 AM. Unknown prior deficits from other strokes. Patient  reports that he felt like prior stroke. NIHSS on arrival of 4. Per EMS patient had R sided deficits when they arrived. Code stroke called.  NIH Stroke Scale  Interval: Baseline Time: 9:28 PM Person Administering Scale: [place]  Administer stroke scale items in the order listed. Record performance in each category after each subscale exam. Do not go back and change scores. Follow directions provided for each exam technique. Scores should reflect what the patient does, not what the clinician thinks the patient can do. The clinician should record answers while administering the exam and work quickly. Except where indicated, the patient should not be coached (i.e., repeated requests to patient to make a special effort).   1a  Level of consciousness: 1=not alert but arousable by minor stimulation to obey, answer or respond  1b. LOC questions:  0=Performs both tasks correctly  1c. LOC commands: 0=Performs both tasks correctly  2.  Best Gaze: 0=normal  3.  Visual: 0=No visual loss  4. Facial Palsy: 1=Minor paralysis (flattened nasolabial fold, asymmetric on smiling)  5a.  Motor left arm: 0=No drift, limb holds 90 (or 45) degrees for full 10 seconds  5b.  Motor right arm: 0=No drift, limb holds 90 (or 45) degrees for full 10 seconds  6a. motor left leg: 2=Some effort against gravity, limb cannot get to or maintain (if cured) 90 (or 45) degrees, drifts down to bed, but has some effort against gravity  6b  Motor right leg:  0=No drift, limb holds 90 (or 45) degrees for full 10 seconds  7. Limb Ataxia: 0=Absent  8.  Sensory: 0=Normal; no sensory loss  9. Best Language:  0=No aphasia, normal  10. Dysarthria: 0=Normal  11. Extinction and Inattention: 0=No abnormality  12. Distal motor function: 0=Normal   Total:   4     Clinical Course  Comment By Time  Head CT negative for acute hemorrhagic stroke. Will give full dose ASA. Awaiting teleneurology eval. Nita Sickle, MD 09/14 1014    [facility] Neurology recommends against tPA due to mild symptoms and unclear onset of new symptoms. Facial droop is new from last seen in 2016. Neurology recommended admission for TIA. Will discuss with hospitalist.  Nita Sickle, MD 09/14 1024    Pertinent labs & imaging results that were available during my care of the patient were reviewed by me and considered in my medical decision making (see chart for details).    ____________________________________________   FINAL CLINICAL IMPRESSION(S) / ED DIAGNOSES  Final diagnoses:  Transient cerebral ischemia, unspecified transient cerebral ischemia type  AKI (acute kidney injury) (HCC)  Hyperkalemia      NEW MEDICATIONS STARTED DURING THIS VISIT:  Current Discharge Medication List       Note:  This document was prepared using Dragon voice recognition software and may include unintentional dictation errors.    Nita Sickle, MD 07/03/16 2129

## 2016-07-03 NOTE — Progress Notes (Signed)
Dr Elpidio AnisSudini made aware of pts generalized swelling and he is aware that pt normally doesn't wear o2 at home and is now requiring 2L [place] since coming to hospital

## 2016-07-03 NOTE — H&P (Signed)
[facility] [place] at [facility]   PATIENT NAME: Stacey Baker    MR#:  [id number]  DATE OF BIRTH:  07/08/1932  DATE OF ADMISSION:  07/03/2016  PRIMARY CARE PHYSICIAN: Odelia GageMCGRANAGHAN, MARY BETH, PA-C   REQUESTING/REFERRING PHYSICIAN: Dr. Don PerkingVeronese  CHIEF COMPLAINT:   Chief Complaint  Patient presents with  . Code Stroke    HISTORY OF PRESENT ILLNESS:  Stacey ManchesterMiriam Baker  is a 82 y.o. female with a known history of CVA, expressive aphasia, hypertension, dementia, Parkinson's disease presents to the emergency room from local nursing home due to tremors and worsening right-sided weakness. Patient was found to be slumped onto the right. She does have right-sided weakness from old stroke. Patient is a poor historian. CT scan of the head showed nothing acute and she is being admitted to the hospitalist service for further workup for CVA versus TIA as per tele neurology recommendations.  PAST MEDICAL HISTORY:   Past Medical History:  Diagnosis Date  . CVA (cerebral infarction)   . Dementia   . Depression   . Encephalopathy   . Hyperlipidemia   . Hypertension   . Parkinson disease (HCC)   . Stroke [facility](HCC)     PAST SURGICAL HISTORY:   Past Surgical History:  Procedure Laterality Date  . ABDOMINAL HYSTERECTOMY    . CHOLECYSTECTOMY      SOCIAL HISTORY:   Social History  Substance Use Topics  . Smoking status: Never Smoker  . Smokeless tobacco: Never Used  . Alcohol use No    FAMILY HISTORY:   Family History  Problem Relation Age of Onset  . Heart disease Mother   . Hypertension Other     DRUG ALLERGIES:   Allergies  Allergen Reactions  . Penicillins Other (See Comments)    Reaction:  Unknown   . Plavix [Clopidogrel Bisulfate] Other (See Comments)    Reaction:  Unknown     REVIEW OF SYSTEMS:   Review of Systems  Unable to perform ROS: Dementia    MEDICATIONS AT HOME:   Prior to Admission medications   Medication Sig Start Date End Date  Taking? Authorizing Provider  amLODipine (NORVASC) 10 MG tablet Take 1 tablet (10 mg total) by mouth daily. 09/15/15  Yes Katharina Caperima Vaickute, MD  aspirin EC 81 MG tablet Take 81 mg by mouth daily.   Yes Historical Provider, MD  atorvastatin (LIPITOR) 10 MG tablet Take 10 mg by mouth at bedtime.   Yes Historical Provider, MD  carbidopa-levodopa (SINEMET IR) 25-100 MG tablet Take 1 tablet by mouth 3 (three) times daily.   Yes Historical Provider, MD  diclofenac sodium (VOLTAREN) 1 % GEL Apply 2 g topically 2 (two) times daily. Pt applies to left knee.   Yes Historical Provider, MD  donepezil (ARICEPT) 5 MG tablet Take 5 mg by mouth at bedtime.   Yes Historical Provider, MD  escitalopram (LEXAPRO) 10 MG tablet Take 10 mg by mouth daily.   Yes Historical Provider, MD  ferrous sulfate 325 (65 FE) MG tablet Take 325 mg by mouth 2 (two) times daily.   Yes Historical Provider, MD  levETIRAcetam (KEPPRA) 500 MG tablet Take 500 mg by mouth 2 (two) times daily.   Yes Historical Provider, MD  mirtazapine (REMERON) 15 MG tablet Take 15 mg by mouth at bedtime.   Yes Historical Provider, MD  QUEtiapine (SEROQUEL) 25 MG tablet Take 25 mg by mouth at bedtime.   Yes Historical Provider, MD  vitamin B-12 (CYANOCOBALAMIN) 1000 MCG tablet  Take 1,000 mcg by mouth daily.   Yes Historical Provider, MD  levofloxacin (LEVAQUIN) 250 MG tablet Take 1 tablet (250 mg total) by mouth daily. Patient not taking: Reported on 07/03/2016 01/06/16   Alford Highland, MD     VITAL SIGNS:  Blood pressure (!) 147/70, pulse (!) 49, temperature 98.2 F (36.8 C), temperature source Oral, resp. rate 18, height 5\' 3"  (1.6 m), weight 65.8 kg (145 lb), SpO2 98 %.  PHYSICAL EXAMINATION:  Physical Exam  GENERAL:  82 y.o.-year-old patient lying in the bed with no acute distress.  EYES: Pupils equal, round, reactive to light and accommodation. No scleral icterus. Extraocular muscles intact.  HEENT: Head atraumatic, normocephalic. Oropharynx and  nasopharynx clear. No oropharyngeal erythema, dryoral mucosa  NECK:  Supple, no jugular venous distention. No thyroid enlargement, no tenderness.  LUNGS: Normal breath sounds bilaterally, no wheezing, rales, rhonchi. No use of accessory muscles of respiration.  CARDIOVASCULAR: S1, S2 normal. No murmurs, rubs, or gallops.  ABDOMEN: Soft, nontender, nondistended. Bowel sounds present. No organomegaly or mass.  EXTREMITIES: No pedal edema, cyanosis, or clubbing. + 2 pedal & radial pulses b/l.   NEUROLOGIC: Right-sided facial droop. Decreased motor strength on the right side. Sensations intact. Expressive aphagia. PSYCHIATRIC: The patient is alert and awake.  Pleasant SKIN: No obvious rash, lesion, or ulcer.   LABORATORY PANEL:   CBC  Recent Labs Lab 07/03/16 0947  WBC 5.0  HGB 11.8*  HCT 35.8  PLT 122*   ------------------------------------------------------------------------------------------------------------------  Chemistries   Recent Labs Lab 07/03/16 0947  NA 143  K 5.4*  CL 114*  CO2 22  GLUCOSE 97  BUN 38*  CREATININE 1.81*  CALCIUM 8.4*  AST 24  ALT 9*  ALKPHOS 101  BILITOT 0.5   ------------------------------------------------------------------------------------------------------------------  Cardiac Enzymes  Recent Labs Lab 07/03/16 0947  TROPONINI <0.03   ------------------------------------------------------------------------------------------------------------------  RADIOLOGY:  Ct Head Wo Contrast  Result Date: 07/03/2016 CLINICAL DATA:  Code stroke. Mental status changes beginning 1.5 hours ago. EXAM: CT HEAD WITHOUT CONTRAST TECHNIQUE: Contiguous axial images were obtained from the base of the skull through the vertex without intravenous contrast. COMPARISON:  12/31/2015 FINDINGS: Brain: The brain shows generalized atrophy. There chronic small-vessel ischemic changes of the deep and subcortical white matter. No sign of acute infarction, mass  lesion, hemorrhage, hydrocephalus or extra-axial collection. No hyperdense vessels. Vascular: No hyperdense vessels. Skull: Normal Sinuses/Orbits: Clear Other: None significant IMPRESSION: Atrophy and chronic small-vessel ischemic changes. No acute finding. These results were called by telephone at the time of interpretation on 07/03/2016 at 10:10 am to Dr. Nita Sickle , who verbally acknowledged these results. Electronically Signed   By: Paulina Fusi M.D.   On: 07/03/2016 10:13   IMPRESSION AND PLAN:   * Worsening right weakness Likely acute CVA Admit to tele ASA, Statin MRI, Carotid dopplers Neurology consult PT/Speech  * AKI with hyperkalemia Start IV fluids. Likely due to decreased oral intake. Repeat labs in the morning. Monitor input and output.  * Hypertension Continue meds  * Parkinson's disease Continue levodopa  * DVT prophylaxis Lovenox  All the records are reviewed and case discussed with ED provider. Management plans discussed with the patient, family and they are in agreement.  CODE STATUS: FULL CODE  TOTAL TIME TAKING CARE OF THIS PATIENT: 40 minutes.   Milagros Loll R M.D on 07/03/2016 at 11:06 AM  Between 7am to 6pm - Pager - [phone]  After 6pm go to [email or web address] - password [email or web address] [facility]  Hospitalists  Office  [phone]  CC: Primary care physician; [name] BETH, PA-C  Note: This dictation was prepared with Dragon dictation along with smaller phrase technology. Any transcriptional errors that result from this process are unintentional.

## 2016-07-04 ENCOUNTER — Encounter: Payer: Self-pay | Admitting: Nurse Practitioner

## 2016-07-04 DIAGNOSIS — I279 Pulmonary heart disease, unspecified: Secondary | ICD-10-CM | POA: Diagnosis not present

## 2016-07-04 DIAGNOSIS — R001 Bradycardia, unspecified: Secondary | ICD-10-CM | POA: Diagnosis not present

## 2016-07-04 DIAGNOSIS — I313 Pericardial effusion (noninflammatory): Secondary | ICD-10-CM | POA: Diagnosis present

## 2016-07-04 DIAGNOSIS — I319 Disease of pericardium, unspecified: Secondary | ICD-10-CM

## 2016-07-04 DIAGNOSIS — N183 Chronic kidney disease, stage 3 unspecified: Secondary | ICD-10-CM | POA: Diagnosis present

## 2016-07-04 DIAGNOSIS — G459 Transient cerebral ischemic attack, unspecified: Secondary | ICD-10-CM | POA: Diagnosis not present

## 2016-07-04 DIAGNOSIS — I3139 Other pericardial effusion (noninflammatory): Secondary | ICD-10-CM | POA: Diagnosis present

## 2016-07-04 LAB — CBC
HEMATOCRIT: 34.1 % — AB (ref 35.0–47.0)
HEMOGLOBIN: 11.5 g/dL — AB (ref 12.0–16.0)
MCH: 29.5 pg (ref 26.0–34.0)
MCHC: 33.7 g/dL (ref 32.0–36.0)
MCV: 87.6 fL (ref 80.0–100.0)
Platelets: 94 10*3/uL — ABNORMAL LOW (ref 150–440)
RBC: 3.89 MIL/uL (ref 3.80–5.20)
RDW: 15.8 % — AB (ref 11.5–14.5)
WBC: 3.5 10*3/uL — AB (ref 3.6–11.0)

## 2016-07-04 LAB — TSH: TSH: 2.943 u[IU]/mL (ref 0.350–4.500)

## 2016-07-04 LAB — BASIC METABOLIC PANEL
ANION GAP: 6 (ref 5–15)
BUN: 36 mg/dL — AB (ref 6–20)
CHLORIDE: 112 mmol/L — AB (ref 101–111)
CO2: 27 mmol/L (ref 22–32)
Calcium: 8.2 mg/dL — ABNORMAL LOW (ref 8.9–10.3)
Creatinine, Ser: 1.46 mg/dL — ABNORMAL HIGH (ref 0.44–1.00)
GFR calc Af Amer: 37 mL/min — ABNORMAL LOW (ref >60)
GFR calc non Af Amer: 32 mL/min — ABNORMAL LOW (ref >60)
GLUCOSE: 76 mg/dL (ref 65–99)
POTASSIUM: 4.3 mmol/L (ref 3.5–5.1)
SODIUM: 145 mmol/L (ref 135–145)

## 2016-07-04 MED ORDER — ORAL CARE MOUTH RINSE
15.0000 mL | Freq: Two times a day (BID) | OROMUCOSAL | Status: DC
  Administered 2016-07-04 – 2016-07-05 (×3): 15 mL via OROMUCOSAL

## 2016-07-04 MED ORDER — DEXTROSE 5 % IV SOLN
1.0000 g | INTRAVENOUS | Status: DC
  Administered 2016-07-04: 1 g via INTRAVENOUS
  Filled 2016-07-04: qty 10

## 2016-07-04 NOTE — NC FL2 (Signed)
[place] MEDICAID FL2 LEVEL OF CARE SCREENING TOOL     IDENTIFICATION  Patient Name: Stacey ManchesterMiriam Tegethoff Birthdate: 03/31/1932 Sex: female Admission Date (Current Location): 07/03/2016  [facility] [place] [place]   Facility and Address:  [facility], [address], [place], [place] [address]      Provider Number: [id number]  Attending Physician Name and Address:  Katharina Caperima Vaickute, MD  Relative Name and Phone Number:       Current Level of Care: Hospital Recommended Level of Care: Assisted Living Facility Prior Approval Number:    Date Approved/Denied:   PASRR Number:    Discharge Plan: Domiciliary (Rest home)    Current Diagnoses: Patient Active Problem List   Diagnosis Date Noted  . CKD (chronic kidney disease), stage III   . Pericardial effusion   . Sinus bradycardia   . Acute renal failure (ARF) (HCC) 12/31/2015  . Chronic renal insufficiency 09/15/2015  . Bradycardia 09/15/2015  . Cellulitis 09/13/2015  . Parkinson disease (HCC) 09/13/2015  . HTN (hypertension) 09/13/2015  . HLD (hyperlipidemia) 09/13/2015  . Depression 09/13/2015  . Dementia 09/13/2015  . Hyperkalemia 09/13/2015  . TIA (transient ischemic attack) 08/27/2015    Orientation RESPIRATION BLADDER Height & Weight     Self, Time, Situation, Place  Normal Incontinent Weight: 167 lb 6.4 oz (75.9 kg) Height:  5\' 3"  (160 cm)  BEHAVIORAL SYMPTOMS/MOOD NEUROLOGICAL BOWEL NUTRITION STATUS   (None)   Continent Diet (DYS 3)  AMBULATORY STATUS COMMUNICATION OF NEEDS Skin   Limited Assist Verbally Normal                       Personal Care Assistance Level of Assistance  Bathing, Feeding, Dressing Bathing Assistance: Limited assistance Feeding assistance: Independent Dressing Assistance: Limited assistance     Functional Limitations Info  Sight, Hearing, Speech Sight Info: Adequate Hearing Info: Adequate Speech Info: Adequate    SPECIAL CARE FACTORS  FREQUENCY  PT (By licensed PT)     PT Frequency:  (2-3)              Contractures      Additional Factors Info  Allergies, Code Status Code Status Info:  (Full Code) Allergies Info:  (Penicillins, Plavix Clopidogrel Bisulfate)           Current Medications (07/04/2016):  This is the current hospital active medication list Current Facility-Administered Medications  Medication Dose Route Frequency Provider Last Rate Last Dose  . 0.9 %  sodium chloride infusion  250 mL Intravenous PRN Milagros LollSrikar Sudini, MD      . acetaminophen (TYLENOL) tablet 650 mg  650 mg Oral Q6H PRN Milagros LollSrikar Sudini, MD       Or  . acetaminophen (TYLENOL) suppository 650 mg  650 mg Rectal Q6H PRN Srikar Sudini, MD      . albuterol (PROVENTIL) (2.5 MG/3ML) 0.083% nebulizer solution 2.5 mg  2.5 mg Nebulization Q2H PRN Srikar Sudini, MD      . amLODipine (NORVASC) tablet 10 mg  10 mg Oral Daily Srikar Sudini, MD      . aspirin EC tablet 81 mg  81 mg Oral Daily Srikar Sudini, MD   81 mg at 07/04/16 1130  . atorvastatin (LIPITOR) tablet 10 mg  10 mg Oral QHS Milagros LollSrikar Sudini, MD   10 mg at 07/03/16 2034  . carbidopa-levodopa (SINEMET IR) 25-100 MG per tablet immediate release 1 tablet  1 tablet Oral TID Milagros LollSrikar Sudini, MD   1 tablet at  07/04/16 1129  . diclofenac sodium (VOLTAREN) 1 % transdermal gel 2 g  2 g Topical BID Milagros Loll, MD   2 g at 07/03/16 2035  . docusate sodium (COLACE) capsule 100 mg  100 mg Oral BID Milagros Loll, MD   100 mg at 07/04/16 1129  . donepezil (ARICEPT) tablet 5 mg  5 mg Oral QHS Milagros Loll, MD   5 mg at 07/03/16 2034  . escitalopram (LEXAPRO) tablet 10 mg  10 mg Oral Daily Milagros Loll, MD   10 mg at 07/04/16 1129  . ferrous sulfate tablet 325 mg  325 mg Oral BID Milagros Loll, MD   325 mg at 07/04/16 1130  . levETIRAcetam (KEPPRA) tablet 500 mg  500 mg Oral BID Milagros Loll, MD   500 mg at 07/04/16 1129  . MEDLINE mouth rinse  15 mL Mouth Rinse BID Katharina Caper, MD   15 mL at 07/04/16  1422  . mirtazapine (REMERON) tablet 15 mg  15 mg Oral QHS Milagros Loll, MD   15 mg at 07/03/16 2034  . ondansetron (ZOFRAN) tablet 4 mg  4 mg Oral Q6H PRN Milagros Loll, MD       Or  . ondansetron (ZOFRAN) injection 4 mg  4 mg Intravenous Q6H PRN Srikar Sudini, MD      . polyethylene glycol (MIRALAX / GLYCOLAX) packet 17 g  17 g Oral Daily PRN Srikar Sudini, MD      . QUEtiapine (SEROQUEL) tablet 25 mg  25 mg Oral QHS Milagros Loll, MD   25 mg at 07/03/16 2034  . sodium chloride flush (NS) 0.9 % injection 3 mL  3 mL Intravenous Q12H Srikar Sudini, MD   3 mL at 07/04/16 1000     Discharge Medications: DISCHARGE MEDICATIONS:       Current Discharge Medication List        START taking these medications   Details  ciprofloxacin (CIPRO) 250 MG tablet Take 1 tablet (250 mg total) by mouth 2 (two) times daily. Qty: 6 tablet, Refills: 0          CONTINUE these medications which have NOT CHANGED   Details  amLODipine (NORVASC) 10 MG tablet Take 1 tablet (10 mg total) by mouth daily. Qty: 30 tablet, Refills: 5    aspirin EC 81 MG tablet Take 81 mg by mouth daily.    atorvastatin (LIPITOR) 10 MG tablet Take 10 mg by mouth at bedtime.    carbidopa-levodopa (SINEMET IR) 25-100 MG tablet Take 1 tablet by mouth 3 (three) times daily.    diclofenac sodium (VOLTAREN) 1 % GEL Apply 2 g topically 2 (two) times daily. Pt applies to left knee.    escitalopram (LEXAPRO) 10 MG tablet Take 10 mg by mouth daily.    ferrous sulfate 325 (65 FE) MG tablet Take 325 mg by mouth 2 (two) times daily.    levETIRAcetam (KEPPRA) 500 MG tablet Take 500 mg by mouth 2 (two) times daily.    mirtazapine (REMERON) 15 MG tablet Take 15 mg by mouth at bedtime.    QUEtiapine (SEROQUEL) 25 MG tablet Take 25 mg by mouth at bedtime.    vitamin B-12 (CYANOCOBALAMIN) 1000 MCG tablet Take 1,000 mcg by mouth daily.         STOP taking these medications     donepezil (ARICEPT) 5 MG tablet       levofloxacin (LEVAQUIN) 250 MG tablet          Relevant Imaging Results:  Relevant Lab Results:   Additional Information  (SSN [id number])  Verta Ellen Sunkins, LCSW

## 2016-07-04 NOTE — Clinical Social Work Note (Signed)
Clinical Social Work Assessment  Patient Details  Name: Stacey Baker MRN: [id number] Date of Birth: 09/13/1932  Date of referral:  07/04/16               Reason for consult:  Discharge Planning                Permission sought to share information with:  Family Supports Permission granted to share information::  Yes, Verbal Permission Granted  Name::        Agency::     Relationship::   Juanda Crumble- Son)  Sport and exercise psychologist Information:     Housing/Transportation Living arrangements for the past 2 months:  Center ([facility]) Source of Information:  Patient Patient Interpreter Needed:    Criminal Activity/Legal Involvement Pertinent to Current Situation/Hospitalization:  No - Comment as needed Significant Relationships:  Adult Children, Other Family Members Lives with:  Facility Resident ([facility]) Do you feel safe going back to the place where you live?  Yes Need for family participation in patient care:  No (Coment)  Care giving concerns:  Patient is from [facility].    Social Worker assessment / plan:  CSW met with patient at bedside. Patient is alert and oriented X4. CSW introduced herself and her role. Per patient she's lived at [facility] for the past 8 years. Stated that she wants to return. Reported that she's wheelchair bound at baseline. Granted CSW verbal permission to coordinate discharge with [facility]. FL2 completed  CSW spoke to PT informed him that patient is wheelchair bound at baseline.  CSW spoke to [facility]. Requested FL2 be faxed to them. FL2 Faxed. Reported they'll review it.    Employment status:  Retired Forensic scientist:  Medicare PT Recommendations:  [place] / Referral to community resources:     Patient/Family's Response to care: Patient to return to [place] at discharge.  Patient/Family's Understanding of and Emotional Response to Diagnosis, Current Treatment, and Prognosis:   Patient reprots she understands her Diagnosis, Current Treatment, and Prognosis. Thanked CSW for her assistance.   Emotional Assessment Appearance:  Appears stated age Attitude/Demeanor/Rapport:   (None) Affect (typically observed):  Accepting, Calm, Pleasant Orientation:  Oriented to Self, Oriented to Place, Oriented to  Time, Oriented to Situation Alcohol / Substance use:  Not Applicable Psych involvement (Current and /or in the community):  No (Comment)  Discharge Needs  Concerns to be addressed:  Discharge Planning Concerns Readmission within the last 30 days:  No Current discharge risk:  Chronically ill Barriers to Discharge:  Continued Medical Work up   [employer], [place] 07/04/2016, 3:44 PM

## 2016-07-04 NOTE — Consult Note (Signed)
Reason for Consult:R sided weakness  Referring Physician: Dr. Elpidio Anis  CC: R sided weakness   No new complaints this AM.  MRI no abnormalities.   Past Medical History:  Diagnosis Date  . CKD (chronic kidney disease), stage III    a. With acute worsening/AKI noted on several admissions.  . Dementia   . Depression   . Encephalopathy   . Hyperkalemia    a. 08/2015  . Hyperlipidemia   . Hypertension   . Parkinson disease (HCC)   . Pericardial effusion    a. 06/2016 Echo: Ef 60-65%, no rwma, Gr1 DD, mild MR, mildly dil LA, PASP , mod circumferential pericardial effusion - no hemodynamic compromise.  . Sinus bradycardia    a. 08/2015 in setting of beta blocker and hyperkalemia.  . Stroke (HCC)   . TIA (transient ischemic attack)    a. 08/2015 - essentially neg w/u for stroke.    Past Surgical History:  Procedure Laterality Date  . ABDOMINAL HYSTERECTOMY    . CHOLECYSTECTOMY      Family History  Problem Relation Age of Onset  . Heart disease Mother   . Hypertension Other     Social History:  reports that she has never smoked. She has never used smokeless tobacco. She reports that she does not drink alcohol or use drugs.  Allergies  Allergen Reactions  . Penicillins Other (See Comments)    Reaction:  Unknown   . Plavix [Clopidogrel Bisulfate] Other (See Comments)    Reaction:  Unknown     Medications: I have reviewed the patient's current medications.  ROS: History obtained from the patient  General ROS: negative for - chills, fatigue, fever, night sweats, weight gain or weight loss Psychological ROS: negative for - behavioral disorder, hallucinations, memory difficulties, mood swings or suicidal ideation Ophthalmic ROS: negative for - blurry vision, double vision, eye pain or loss of vision ENT ROS: negative for - epistaxis, nasal discharge, oral lesions, sore throat, tinnitus or vertigo Allergy and Immunology ROS: negative for - hives or itchy/watery  eyes Hematological and Lymphatic ROS: negative for - bleeding problems, bruising or swollen lymph nodes Endocrine ROS: negative for - galactorrhea, hair pattern changes, polydipsia/polyuria or temperature intolerance Respiratory ROS: negative for - cough, hemoptysis, shortness of breath or wheezing Cardiovascular ROS: negative for - chest pain, dyspnea on exertion, edema or irregular heartbeat Gastrointestinal ROS: negative for - abdominal pain, diarrhea, hematemesis, nausea/vomiting or stool incontinence Genito-Urinary ROS: negative for - dysuria, hematuria, incontinence or urinary frequency/urgency Musculoskeletal ROS: negative for - joint swelling or muscular weakness Neurological ROS: as noted in HPI Dermatological ROS: negative for rash and skin lesion changes  Physical Examination: Blood pressure (!) 138/46, pulse (!) 48, temperature 97.6 F (36.4 C), temperature source Oral, resp. rate 16, height 5\' 3"  (1.6 m), weight 75.9 kg (167 lb 6.4 oz), SpO2 100 %.   Neurological Examination Mental Status: Alert, oriented, Cranial Nerves: II: Discs flat bilaterally; Visual fields grossly normal, pupils equal, round, reactive to light and accommodation III,IV, VI: ptosis not present, extra-ocular motions intact bilaterally V,VII: smile symmetric, facial light touch sensation normal bilaterally VIII: hearing normal bilaterally IX,X: gag reflex present XI: bilateral shoulder shrug XII: midline tongue extension Motor: Right : Upper extremity   4/5    Left:     Upper extremity   4/5  Lower extremity   3/5     Lower extremity   3/5 Tone and bulk:normal tone throughout; no atrophy noted Sensory: Pinprick and light touch intact  throughout, bilaterally Deep Tendon Reflexes: 2+ and symmetric throughout Plantars: Right: downgoing   Left: downgoing Cerebellar: normal finger-to-nose, normal rapid alternating movements and normal heel-to-shin test Gait: not tested       Laboratory Studies:    Basic Metabolic Panel:  Recent Labs Lab 07/03/16 0947 07/04/16 0503  NA 143 145  K 5.4* 4.3  CL 114* 112*  CO2 22 27  GLUCOSE 97 76  BUN 38* 36*  CREATININE 1.81* 1.46*  CALCIUM 8.4* 8.2*    Liver Function Tests:  Recent Labs Lab 07/03/16 0947  AST 24  ALT 9*  ALKPHOS 101  BILITOT 0.5  PROT 7.2  ALBUMIN 4.0   No results for input(s): LIPASE, AMYLASE in the last 168 hours. No results for input(s): AMMONIA in the last 168 hours.  CBC:  Recent Labs Lab 07/03/16 0947 07/04/16 0503  WBC 5.0 3.5*  NEUTROABS 3.3  --   HGB 11.8* 11.5*  HCT 35.8 34.1*  MCV 87.2 87.6  PLT 122* 94*    Cardiac Enzymes:  Recent Labs Lab 07/03/16 0947  TROPONINI <0.03    BNP: Invalid input(s): POCBNP  CBG:  Recent Labs Lab 07/03/16 0946  GLUCAP 88    Microbiology: Results for orders placed or performed during the hospital encounter of 09/13/15  Culture, blood (routine x 2)     Status: None   Collection Time: 09/13/15  7:45 PM  Result Value Ref Range Status   Specimen Description BLOOD LEFT HAND  Final   Special Requests BOTTLES DRAWN AEROBIC AND ANAEROBIC 5ML  Final   Culture NO GROWTH 6 DAYS  Final   Report Status 09/19/2015 FINAL  Final  Culture, blood (routine x 2)     Status: None   Collection Time: 09/13/15  9:10 PM  Result Value Ref Range Status   Specimen Description BLOOD LEFT ANTECUBITAL  Final   Special Requests BOTTLES DRAWN AEROBIC AND ANAEROBIC 5ML  Final   Culture NO GROWTH 6 DAYS  Final   Report Status 09/19/2015 FINAL  Final    Coagulation Studies:  Recent Labs  07/03/16 1033  LABPROT 15.3*  INR 1.20    Urinalysis:   Recent Labs Lab 07/03/16 1604  COLORURINE STRAW*  LABSPEC 1.012  PHURINE 5.0  GLUCOSEU NEGATIVE  HGBUR NEGATIVE  BILIRUBINUR NEGATIVE  KETONESUR NEGATIVE  PROTEINUR NEGATIVE  NITRITE NEGATIVE  LEUKOCYTESUR TRACE*    Lipid Panel:     Component Value Date/Time   CHOL 130 08/28/2015 1124   CHOL 157 05/04/2014  0453   TRIG 37 08/28/2015 1124   TRIG 52 05/04/2014 0453   HDL 61 08/28/2015 1124   HDL 79 (H) 05/04/2014 0453   CHOLHDL 2.1 08/28/2015 1124   VLDL 7 08/28/2015 1124   VLDL 10 05/04/2014 0453   LDLCALC 62 08/28/2015 1124   LDLCALC 68 05/04/2014 0453    HgbA1C:  Lab Results  Component Value Date   HGBA1C 5.1 08/28/2015    Urine Drug Screen:      Component Value Date/Time   LABOPIA NONE DETECTED 07/03/2016 1604   COCAINSCRNUR NONE DETECTED 07/03/2016 1604   LABBENZ NONE DETECTED 07/03/2016 1604   AMPHETMU NONE DETECTED 07/03/2016 1604   THCU NONE DETECTED 07/03/2016 1604   LABBARB NONE DETECTED 07/03/2016 1604    Alcohol Level:   Recent Labs Lab 07/03/16 0947  ETH <5     Imaging: Ct Head Wo Contrast  Result Date: 07/03/2016 CLINICAL DATA:  Code stroke. Mental status changes beginning 1.5 hours ago. EXAM:  CT HEAD WITHOUT CONTRAST TECHNIQUE: Contiguous axial images were obtained from the base of the skull through the vertex without intravenous contrast. COMPARISON:  12/31/2015 FINDINGS: Brain: The brain shows generalized atrophy. There chronic small-vessel ischemic changes of the deep and subcortical white matter. No sign of acute infarction, mass lesion, hemorrhage, hydrocephalus or extra-axial collection. No hyperdense vessels. Vascular: No hyperdense vessels. Skull: Normal Sinuses/Orbits: Clear Other: None significant IMPRESSION: Atrophy and chronic small-vessel ischemic changes. No acute finding. These results were called by telephone at the time of interpretation on 07/03/2016 at 10:10 am to Dr. Nita Sickle , who verbally acknowledged these results. Electronically Signed   By: Paulina Fusi M.D.   On: 07/03/2016 10:13   Mr Brain Wo Contrast  Result Date: 07/03/2016 CLINICAL DATA:  Tremors an worsening right-sided weakness. EXAM: MRI HEAD WITHOUT CONTRAST TECHNIQUE: Multiplanar, multiecho pulse sequences of the brain and surrounding structures were obtained without  intravenous contrast. COMPARISON:  CT same day.  MRI 08/28/2015. FINDINGS: Brain: Diffusion imaging does not show any acute or subacute infarction. The brainstem is normal. No focal cerebellar insult. Cerebral hemispheres show atrophy with chronic small-vessel ischemic changes of the white matter. No large vessel territory infarction. No mass lesion, hemorrhage, hydrocephalus or extra-axial collection. Vascular: Major vessels at the base of the brain show flow. Skull and upper cervical spine: Negative Sinuses/Orbits: Clear Other: No significant finding. IMPRESSION: No acute finding. Atrophy and chronic small vessel disease. No cause of the presenting symptoms is identified. Electronically Signed   By: Paulina Fusi M.D.   On: 07/03/2016 15:24   US Carotid Bilateral  Result Date: 07/03/2016 CLINICAL DATA:  CVA. EXAM: BILATERAL CAROTID DUPLEX ULTRASOUND TECHNIQUE: Wallace Cullens scale imaging, color Doppler and duplex ultrasound were performed of bilateral carotid and vertebral arteries in the neck. COMPARISON:  08/28/2015 FINDINGS: Criteria: Quantification of carotid stenosis is based on velocity parameters that correlate the residual internal carotid diameter with NASCET-based stenosis levels, using the diameter of the distal internal carotid lumen as the denominator for stenosis measurement. The following velocity measurements were obtained: RIGHT ICA:  110 cm/sec CCA:  86 cm/sec SYSTOLIC ICA/CCA RATIO:  1.3 DIASTOLIC ICA/CCA RATIO:  0.7 ECA:  72 cm/sec LEFT ICA:  79 cm/sec CCA:  68 cm/sec SYSTOLIC ICA/CCA RATIO:  1.2 DIASTOLIC ICA/CCA RATIO:  1.6 ECA:  55 cm/sec RIGHT CAROTID ARTERY: Small amount of echogenic plaque in the distal common carotid artery and bulb. Small amount of echogenic plaque in the proximal internal carotid artery without significant stenosis. Normal waveforms and velocities in the right internal carotid artery. RIGHT VERTEBRAL ARTERY: Antegrade flow and normal waveform in the right vertebral artery.  LEFT CAROTID ARTERY: Small amount of echogenic plaque at the left carotid bulb. Small amount of echogenic plaque in the proximal internal carotid artery. Normal waveforms and velocities in left internal carotid artery. LEFT VERTEBRAL ARTERY: Antegrade flow and normal waveform in the left vertebral artery. IMPRESSION: Mild atherosclerotic disease in the carotid arteries bilaterally. Estimated degree of stenosis in the internal carotid arteries is less than 50% bilaterally. Patent vertebral arteries with antegrade flow. Electronically Signed   By: Richarda Overlie M.D.   On: 07/03/2016 12:21     Assessment/Plan:   82 y.o. female  with a known history of CVA, expressive aphasia, hypertension, dementia, Parkinson's disease presents to the emergency room from local nursing home due to tremors and worsening right-sided weakness.   MRI of no acute abnormality.  Maybe slight weakness RUE but seams very symmetrical.   D/c  planning from neuro stand point after PT/OT   07/04/2016, 1:45 PM

## 2016-07-04 NOTE — Care Management Obs Status (Signed)
MEDICARE OBSERVATION STATUS NOTIFICATION   Patient Details  Name: Stacey Baker MRN: [id number] Date of Birth: 02/15/1932   Medicare Observation Status Notification Given:  Yes    Gwenette GreetBrenda S Gwynneth Fabio, RN 07/04/2016, 9:01 AM

## 2016-07-04 NOTE — Progress Notes (Signed)
MD notified that urine culture grew gram negative rods. Order for rocephin given. Otilio JeffersonMadelyn S Fenton, RN

## 2016-07-04 NOTE — Care Management (Addendum)
Admitted to [facility] with the diagnosis of TIA under observation status. A resident of [facility] for 4 years. Son is [name] [phone]).  Primary care physician is Dr, Jaye BeagleGranaghan. Prior to [facility] was a resident of [place]. Followed by [facility] in the past. Followed by palliative care in the facility in the past. No home oxygen. Uses a wheelchair to aid in ambulation.  Gwenette GreetBrenda S Izayah Miner RN MSN CCM Care Management [phone]

## 2016-07-04 NOTE — Evaluation (Signed)
Clinical/Bedside Swallow Evaluation Patient Details  Name: Stacey Baker MRN: [id number] Date of Birth: 07/11/1932  Today's Date: 07/04/2016 Time: SLP Start Time (ACUTE ONLY): 0830 SLP Stop Time (ACUTE ONLY): 0930 SLP Time Calculation (min) (ACUTE ONLY): 60 min  Past Medical History:  Past Medical History:  Diagnosis Date  . CVA (cerebral infarction)   . Dementia   . Depression   . Encephalopathy   . Hyperlipidemia   . Hypertension   . Parkinson disease (HCC)   . Stroke [facility](HCC)    Past Surgical History:  Past Surgical History:  Procedure Laterality Date  . ABDOMINAL HYSTERECTOMY    . CHOLECYSTECTOMY     HPI:  Stacey Baker is an 82 y.o. female with a known history of CVA, expressive aphasia, hypertension, dementia, Parkinson's disease presents to the emergency room from local nursing home due to tremors and worsening right-sided weakness. Patient was found to be slumped onto the right. She does have right-sided weakness from old stroke. Patient is a poor historian. CT scan of the head showed no  Acute abnormality.     Assessment / Plan / Recommendation Clinical Impression  82 year old nursing home resident with past medical history including CVA with expressive aphasia, dementia, and Parkinson's is presenting with no significant clinical indicators of aspiration per clinical assessment.  The patient has RUE weakness and needs assistance with meal set-up and self-feeding. Recommend continue regular diet- assist with set-up and monitor efficacy of self-feeding.  No further speech therapy is indicated at this time.  Please re-consult with any problems with swallowing.    Aspiration Risk       Diet Recommendation Regular;Thin liquid (Cut up meats)   Liquid Administration via: Cup;Straw Supervision: Comment (RUE weakness, may need assistance)    Other  Recommendations     Follow up Recommendations  Visit Diagnosis       Transient cerebral ischemia, unspecified transient cerebral  ischemia type  Right sided weakness - Plan: MR Brain Wo Contrast, MR Brain Wo Contrast  CVA (cerebral infarction) - Plan: US Carotid Bilateral, US Carotid Bilateral  AKI (acute kidney injury) (HCC)  Hyperkalemia    Frequency and Duration            Prognosis        Swallow Study   General HPI: Stacey Baker is an 82 y.o. female with a known history of CVA, expressive aphasia, hypertension, dementia, Parkinson's disease presents to the emergency room from local nursing home due to tremors and worsening right-sided weakness. Patient was found to be slumped onto the right. She does have right-sided weakness from old stroke. Patient is a poor historian. CT scan of the head showed no  Acute abnormality.   Type of Study: Bedside Swallow Evaluation Previous Swallow Assessment: Unknown Self-Feeding Abilities: Needs assist;Needs set up;Other (Comment) (Patient indicates she uses adaptive equipment) Patient Positioning: Upright in bed Baseline Vocal Quality: Normal    Oral/Motor/Sensory Function Overall Oral Motor/Sensory Function: Within functional limits   Ice Chips     Thin Liquid Thin Liquid: Within functional limits Presentation: Straw Other Comments: Drank 4 ounces thin liquid without difficulty    Nectar Thick     Honey Thick     Puree     Solid   GO   Solid: Within functional limits Presentation: Self Fed;Spoon Other Comments: ate bowl of cereal with small amount of milk to moisten    Functional Assessment Tool Used: Clinical judgment Functional Limitations: Swallowing Swallow Current Status (W2956([id number]): At least 1  percent but less than 20 percent impaired, limited or restricted Swallow Goal Status (Z6109): At least 1 percent but less than 20 percent impaired, limited or restricted Swallow Discharge Status [phone]): At least 1 percent but less than 20 percent impaired, limited or restricted    Dollene Primrose, MS/CCC- SLP  Tedd Sias, Susie 07/04/2016,10:27  AM

## 2016-07-04 NOTE — Evaluation (Signed)
Physical Therapy Evaluation Patient Details Name: Stacey ManchesterMiriam Baker MRN: [id number] DOB: 12/31/1931 Today's Date: 07/04/2016   History of Present Illness  82 y.o. female with a known history of CVA, expressive aphasia, hypertension, dementia, Parkinson's disease presents to the emergency room from local nursing home due to tremors and worsening right-sided weakness. Patient was found to be slumped onto the right. She does have right-sided weakness from old stroke. Patient is a poor historian. CT scan of the head showed nothing acute.  Clinical Impression  Pt does show decent effort w/o the PT exam despite some confusion but ultimately is very limited.  Difficult to get full prior level of function but it appears she is essentially w/c bound.  She was able to attain standing with considerable assist and take a few side shuffle steps along EOB but was not able to do any real ambulation. Pt pleasant but confused t/o the effort.     Follow Up Recommendations Home health PT;SNF (per PLOF, may need STR if she was somewhat mobile)    Equipment Recommendations       Recommendations for Other Services       Precautions / Restrictions Precautions Precautions: Fall Restrictions Weight Bearing Restrictions: No      Mobility  Bed Mobility Overal bed mobility: Needs Assistance Bed Mobility: Supine to Sit;Sit to Supine     Supine to sit: Mod assist;Max assist Sit to supine: Max assist   General bed mobility comments: Pt shows some effort in getting to EOB but ultimately needs a lot of assist  Transfers Overall transfer level: Needs assistance Equipment used: Rolling walker (2 wheeled) Transfers: Sit to/from Stand Sit to Stand: Mod assist;Max assist         General transfer comment: Pt shows good effort but is weak, fearful and ultimately quite limited.   Ambulation/Gait             General Gait Details: unable to ambulate, pt did take 2 very small shuffling side steps along EOB with  a lot of assist  Stairs            Wheelchair Mobility    Modified Rankin (Stroke Patients Only)       Balance                                             Pertinent Vitals/Pain Pain Assessment: No/denies pain    Home Living Family/patient expects to be discharged to:: Assisted living               Home Equipment: Wheelchair - manual      Prior Function Level of Independence: Needs assistance   Gait / Transfers Assistance Needed: Pt reports that she does not walk and is in w/c much of the time     Comments: It appears pt is very limited, she reports she cannot walk     Hand Dominance        Extremity/Trunk Assessment   Upper Extremity Assessment: Overall WFL for tasks assessed           Lower Extremity Assessment: Overall WFL for tasks assessed         Communication   Communication: HOH  Cognition Arousal/Alertness:  (confused) Behavior During Therapy: Anxious Overall Cognitive Status: Difficult to assess  General Comments      Exercises     Assessment/Plan    PT Assessment Patient needs continued PT services  PT Problem List Decreased strength;Decreased activity tolerance;Decreased balance;Decreased mobility;Decreased coordination;Decreased safety awareness;Decreased knowledge of use of DME       PT Diagnosis   Transient cerebral ischemia, unspecified transient cerebral ischemia type  Right sided weakness - Plan: MR Brain Wo Contrast, MR Brain Wo Contrast  CVA (cerebral infarction) - Plan: US Carotid Bilateral, US Carotid Bilateral  AKI (acute kidney injury) (HCC)  Hyperkalemia    PT Treatment Interventions Gait training;Balance training;Therapeutic exercise;Therapeutic activities;DME instruction;Functional mobility training;Cognitive remediation    PT Goals (Current goals can be found in the Care Plan section)  Acute Rehab PT Goals Patient Stated Goal: pt unable to  state PT Goal Formulation: Patient unable to participate in goal setting Time For Goal Achievement: 07/18/16 Potential to Achieve Goals: Fair    Frequency Min 2X/week   Barriers to discharge        Co-evaluation               End of Session Equipment Utilized During Treatment: Gait belt Activity Tolerance: Patient limited by fatigue Patient left: with bed alarm set;with call bell/phone within reach      Functional Assessment Tool Used: clinical judgement Functional Limitation: Mobility: Walking and moving around Mobility: Walking and Moving Around Current Status [phone]): At least 80 percent but less than 100 percent impaired, limited or restricted Mobility: Walking and Moving Around Goal Status [phone]): At least 60 percent but less than 80 percent impaired, limited or restricted    Time: 0950-1012 PT Time Calculation (min) (ACUTE ONLY): 22 min   Charges:   PT Evaluation $PT Eval Low Complexity: 1 Procedure     PT G Codes:   PT G-Codes **NOT FOR INPATIENT CLASS** Functional Assessment Tool Used: clinical judgement Functional Limitation: Mobility: Walking and moving around Mobility: Walking and Moving Around Current Status (O5366): At least 80 percent but less than 100 percent impaired, limited or restricted Mobility: Walking and Moving Around Goal Status [phone]): At least 60 percent but less than 80 percent impaired, limited or restricted    Malachi Pro, DPT 07/04/2016, 1:38 PM

## 2016-07-04 NOTE — Plan of Care (Signed)
Problem: Activity: Goal: Risk for activity intolerance will decrease Outcome: Not Progressing Pt experiences dizziness upon standing.

## 2016-07-04 NOTE — Consult Note (Signed)
Cardiology Consult    Patient ID: Stacey Baker MRN: [id number], DOB/AGE: April 23, 1932   Admit date: 07/03/2016 Date of Consult: 07/04/2016  Primary Physician: Merlene Pulling, PA-C Primary Cardiologist: New - seen by Judie Petit. Kirke Corin, MD  Requesting Provider: Lenetta Quaker  Patient Profile    82 y/o ? with a h/o HTN, HL, CKD III, TIA/Stroke, parkinson's dzs, and dementia, who was admitted 9/14 with tremors and worsening right sided wkns and has been found to have a moderate pericardial effusion and bradycardia.  Past Medical History   Past Medical History:  Diagnosis Date  . CKD (chronic kidney disease), stage III    a. With acute worsening/AKI noted on several admissions.  . Dementia   . Depression   . Encephalopathy   . Hyperkalemia    a. 08/2015  . Hyperlipidemia   . Hypertension   . Parkinson disease (HCC)   . Pericardial effusion    a. 06/2016 Echo: Ef 60-65%, no rwma, Gr1 DD, mild MR, mildly dil LA, PASP , mod circumferential pericardial effusion - no hemodynamic compromise.  . Sinus bradycardia    a. 08/2015 in setting of beta blocker and hyperkalemia.  . Stroke (HCC)   . TIA (transient ischemic attack)    a. 08/2015 - essentially neg w/u for stroke.    Past Surgical History:  Procedure Laterality Date  . ABDOMINAL HYSTERECTOMY    . CHOLECYSTECTOMY       Allergies  Allergies  Allergen Reactions  . Penicillins Other (See Comments)    Reaction:  Unknown   . Plavix [Clopidogrel Bisulfate] Other (See Comments)    Reaction:  Unknown     History of Present Illness    82 y/o ? without a prior cardiac history.  She does have several risk factors including HTN, HL, TIA/CVA, and CKD III, in addition to a prior h/o dementia, parkinson's, and intermittent hyperkalemia.  She lives @ a skilled nursing facility.  She is unable to provide a clear history as to why she is in the hospital though per notes, on 9/14, she was noted by nsg home staff to have worsening tremors  and right sided weakness, and was found sitting while slumped onto her right side.  There is no report of unresponsiveness.  She was taken to the [facility] ED where ECG showed sinus brady @ 48 (inital BP was 147/70).  Head CT was non-acute.  K was elevated @ 5.4 in the setting of AKI on CKD III with a creat of 1.81.  ECG did show subtle inflat ST elevation, though there was no c/o chest pain or dyspnea, and Troponin was nl.  She was admitted by IM.  MRI was non-acute, while carotid u/s did not show any significant stenosis bilat.  Echo was performed 9/14 and showed nl EF with a moderate circumferential pericardial effusion w/o hemodynamic compromise.  We've been asked to eval.  She has no complaints this AM.  She has remained bradycardic in the high 40's to 50's @ rest, w/o evidence of high grade heart block on tele.  She does report a h/o intermittent lightheadedness over "many, many years," but denies syncope.  Inpatient Medications    . amLODipine  10 mg Oral Daily  . aspirin EC  81 mg Oral Daily  . atorvastatin  10 mg Oral QHS  . carbidopa-levodopa  1 tablet Oral TID  . diclofenac sodium  2 g Topical BID  . docusate sodium  100 mg Oral BID  . donepezil  5  mg Oral QHS  . enoxaparin (LOVENOX) injection  30 mg Subcutaneous Q24H  . escitalopram  10 mg Oral Daily  . ferrous sulfate  325 mg Oral BID  . levETIRAcetam  500 mg Oral BID  . mirtazapine  15 mg Oral QHS  . QUEtiapine  25 mg Oral QHS  . sodium chloride flush  3 mL Intravenous Q12H    Family History    Family History  Problem Relation Age of Onset  . Heart disease Mother   . Hypertension Other     Social History    Social History   Social History  . Marital status: Widowed    Spouse name: N/A  . Number of children: N/A  . Years of education: N/A   Occupational History  . Not on file.   Social History Main Topics  . Smoking status: Never Smoker  . Smokeless tobacco: Never Used  . Alcohol use No  . Drug use: No  . Sexual  activity: Not on file   Other Topics Concern  . Not on file   Social History Narrative  . No narrative on file     Review of Systems    General:  No chills, fever, night sweats or weight changes.  Cardiovascular:  No chest pain, +++ dyspnea on exertion with higher levels of activity, +++ chronic bilat LE edema, no orthopnea, palpitations, paroxysmal nocturnal dyspnea. Dermatological: No rash, lesions/masses Respiratory: No cough, dyspnea Urologic: No hematuria, dysuria Abdominal:   No nausea, vomiting, diarrhea, bright red blood per rectum, melena, or hematemesis Neurologic:  No visual changes, wkns, changes in mental status. All other systems reviewed and are otherwise negative except as noted above.  Physical Exam    Blood pressure (!) 156/58, pulse (!) 47, temperature (!) 96.9 F (36.1 C), temperature source Axillary, resp. rate 18, height 5\' 3"  (1.6 m), weight 167 lb 6.4 oz (75.9 kg), SpO2 100 %.  General: Pleasant, NAD Psych: Normal affect. Neuro: Disoriented to time and place. Cooperative.  Moves all extremities spontaneously. HEENT: Normal  Neck: Supple without bruits or JVD. Lungs:  Resp regular and unlabored, CTA. Heart: RRR no s3, s4, or murmurs.  No rubs. Abdomen: Soft, non-tender, non-distended, BS + x 4.  Extremities: No clubbing, cyanosis.  1+ bilat LE edema. DP/PT/Radials 2+ and equal bilaterally.  Labs    Recent Labs  07/03/16 0947  TROPONINI <0.03   Lab Results  Component Value Date   WBC 3.5 (L) 07/04/2016   HGB 11.5 (L) 07/04/2016   HCT 34.1 (L) 07/04/2016   MCV 87.6 07/04/2016   PLT 94 (L) 07/04/2016    Recent Labs Lab 07/03/16 0947 07/04/16 0503  NA 143 145  K 5.4* 4.3  CL 114* 112*  CO2 22 27  BUN 38* 36*  CREATININE 1.81* 1.46*  CALCIUM 8.4* 8.2*  PROT 7.2  --   BILITOT 0.5  --   ALKPHOS 101  --   ALT 9*  --   AST 24  --   GLUCOSE 97 76   Lab Results  Component Value Date   CHOL 130 08/28/2015   HDL 61 08/28/2015   LDLCALC  62 08/28/2015   TRIG 37 08/28/2015     Radiology Studies    Ct Head Wo Contrast  Result Date: 07/03/2016 CLINICAL DATA:  Code stroke. Mental status changes beginning 1.5 hours ago. EXAM: CT HEAD WITHOUT CONTRAST TECHNIQUE: Contiguous axial images were obtained from the base of the skull through the vertex without intravenous contrast.  COMPARISON:  12/31/2015 FINDINGS: Brain: The brain shows generalized atrophy. There chronic small-vessel ischemic changes of the deep and subcortical white matter. No sign of acute infarction, mass lesion, hemorrhage, hydrocephalus or extra-axial collection. No hyperdense vessels. Vascular: No hyperdense vessels. Skull: Normal Sinuses/Orbits: Clear Other: None significant IMPRESSION: Atrophy and chronic small-vessel ischemic changes. No acute finding. These results were called by telephone at the time of interpretation on 07/03/2016 at 10:10 am to Dr. Nita SickleAROLINA VERONESE , who verbally acknowledged these results. Electronically Signed   By: Paulina FusiMark  Shogry M.D.   On: 07/03/2016 10:13   Mr Brain Wo Contrast  Result Date: 07/03/2016 CLINICAL DATA:  Tremors an worsening right-sided weakness. EXAM: MRI HEAD WITHOUT CONTRAST TECHNIQUE: Multiplanar, multiecho pulse sequences of the brain and surrounding structures were obtained without intravenous contrast. COMPARISON:  CT same day.  MRI 08/28/2015. FINDINGS: Brain: Diffusion imaging does not show any acute or subacute infarction. The brainstem is normal. No focal cerebellar insult. Cerebral hemispheres show atrophy with chronic small-vessel ischemic changes of the white matter. No large vessel territory infarction. No mass lesion, hemorrhage, hydrocephalus or extra-axial collection. Vascular: Major vessels at the base of the brain show flow. Skull and upper cervical spine: Negative Sinuses/Orbits: Clear Other: No significant finding. IMPRESSION: No acute finding. Atrophy and chronic small vessel disease. No cause of the presenting  symptoms is identified. Electronically Signed   By: Paulina FusiMark  Shogry M.D.   On: 07/03/2016 15:24   [place] Carotid Bilateral  Result Date: 07/03/2016 CLINICAL DATA:  CVA. EXAM: BILATERAL CAROTID DUPLEX ULTRASOUND TECHNIQUE: Wallace CullensGray scale imaging, color Doppler and duplex ultrasound were performed of bilateral carotid and vertebral arteries in the neck. COMPARISON:  08/28/2015 FINDINGS: Criteria: Quantification of carotid stenosis is based on velocity parameters that correlate the residual internal carotid diameter with NASCET-based stenosis levels, using the diameter of the distal internal carotid lumen as the denominator for stenosis measurement. The following velocity measurements were obtained: RIGHT ICA:  110 cm/sec CCA:  86 cm/sec SYSTOLIC ICA/CCA RATIO:  1.3 DIASTOLIC ICA/CCA RATIO:  0.7 ECA:  72 cm/sec LEFT ICA:  79 cm/sec CCA:  68 cm/sec SYSTOLIC ICA/CCA RATIO:  1.2 DIASTOLIC ICA/CCA RATIO:  1.6 ECA:  55 cm/sec RIGHT CAROTID ARTERY: Small amount of echogenic plaque in the distal common carotid artery and bulb. Small amount of echogenic plaque in the proximal internal carotid artery without significant stenosis. Normal waveforms and velocities in the right internal carotid artery. RIGHT VERTEBRAL ARTERY: Antegrade flow and normal waveform in the right vertebral artery. LEFT CAROTID ARTERY: Small amount of echogenic plaque at the left carotid bulb. Small amount of echogenic plaque in the proximal internal carotid artery. Normal waveforms and velocities in left internal carotid artery. LEFT VERTEBRAL ARTERY: Antegrade flow and normal waveform in the left vertebral artery. IMPRESSION: Mild atherosclerotic disease in the carotid arteries bilaterally. Estimated degree of stenosis in the internal carotid arteries is less than 50% bilaterally. Patent vertebral arteries with antegrade flow. Electronically Signed   By: Richarda OverlieAdam  Henn M.D.   On: 07/03/2016 12:21    ECG & Cardiac Imaging    Sinus Brady, 48, subtle inflat ST  elevation with delayed R progression.  ST elev new.  Assessment & Plan    1.  Right sided wkns/? TIA:  Pt presented with tremors and right sided wkns.  CT/MRI/Carotid U/S unrevealing for acute events.  Question as to role that bradycardia may have played.  Interestingly, BP was mildly elevated upon presentation, thus despite bradycardia, she is clearly perfusing.  There was no mention of unresponsiveness, thus it is unlikely that bradycardia is playing a significant role in her presenting symptoms.  2.  Moderate Pericardial Effusion:  Incidentally noted on echo on 9/14.  Nl EF.  No evidence of hemodynamic compromise.  No h/o chest pain. She reports chronic, stable DOE with higher levels of activity.  Admission ECG with subtle inflat ST elevation, though no symptoms of pericarditis.  Will d/w Dr. Kirke Corin.  Could consider a course of colchicine to possible asymptomatic pericarditis.  Would not use NSAIDs in setting of AKI/CKD III.  Plan for f/u limited echo as an outpt.   3.  Hyperkalemia:  Mildly elevated K of 5.4 on admission in the setting of AKI/CKD III.  This has since normalized.  As bradycardia persists, it's unlikely that elevated K is causing brady, though it did likely make her a little slower than her baseline.    4.  Sinus Bradycardia:  HR 48 on admission with nl BP.  No evidence of high grade heart block on tele.  She has a prior documented h/o bradycardia with admission in 08/2015 for brady in the setting of hyperkalemia and  blocker therapy.  Review of her home med list shows that she is no longer on a  blocker.  She is on aricept, which may contribute to bradycardia.  As she is asymptomatic and w/o high grade heart block, assuming that aricept is helping with her dementia, I would not d/c it at this time.  5.  ST segment Elevation:  In setting of hyperkalemia.  Repeat ECG this AM shows slightly less pronounced inflat ST elev.  No chest pain.  Trop nl on admission.  Echo w/ nl EF and w/o  WMA.  May represent mild pericarditis, esp in light of finding of effusion, though she denies c/p.  See #2.  6.  Pancytopenia:  Per IM.  7.  AKI/CKD III:  Improved this AM.  Signed, Nicolasa Ducking, NP 07/04/2016, 11:19 AM

## 2016-07-04 NOTE — Progress Notes (Signed)
Dr. Winona LegatoVaickute made aware of patient HR dropping to 30s twice. TSH pending. Awaiting cardiology consult. Bo McclintockBrewer,Kevan Prouty S, RN

## 2016-07-04 NOTE — Progress Notes (Signed)
[facility] - [place] at [facility]   PATIENT NAME: Stacey Baker    MR#:  [id number]  DATE OF BIRTH:  29-Mar-1932  SUBJECTIVE:  CHIEF COMPLAINT:   Chief Complaint  Patient presents with  . Code Stroke  Patient is a 82-year-old Caucasian female with asthma, history significant for history of stroke with right-sided weakness, dementia, depression, hyperlipidemia, hypertension, who presents to the hospital with complaints of tremors and worsening right-sided weakness, the patient was slumped into the right. She was brought to emergency room for further evaluation and treatment. CT of the head showed no acute changes. Patient was admitted to the hospital and underwent MRI of the brain, which also showed no acute changes. She was seen by neurologist, who acknowledged patient's right upper extremity weakness, which she felt chronic. Patient was also noted to be bradycardic with heart rate in the 30s to 40s intermittently read that she is not on beta blockers or calcium channel blockers, except of Norvasc. Blood pressure remains stable. Patient feels relatively good today  Review of Systems  Constitutional: Negative for chills, fever and weight loss.  HENT: Negative for congestion.   Eyes: Negative for blurred vision and double vision.  Respiratory: Negative for cough, sputum production, shortness of breath and wheezing.   Cardiovascular: Negative for chest pain, palpitations, orthopnea, leg swelling and PND.  Gastrointestinal: Negative for abdominal pain, blood in stool, constipation, diarrhea, nausea and vomiting.  Genitourinary: Negative for dysuria, frequency, hematuria and urgency.  Musculoskeletal: Negative for falls.  Neurological: Negative for dizziness, tremors, focal weakness and headaches.  Endo/Heme/Allergies: Does not bruise/bleed easily.  Psychiatric/Behavioral: Negative for depression. The patient does not have insomnia.     VITAL SIGNS: Blood pressure (!)  125/52, pulse (!) 50, temperature 97.6 F (36.4 C), temperature source Oral, resp. rate 16, height 5\' 3"  (1.6 m), weight 75.9 kg (167 lb 6.4 oz), SpO2 100 %.  PHYSICAL EXAMINATION:   GENERAL:  82 y.o.-year-old patient lying in the bed with no acute distress.  EYES: Pupils equal, round, reactive to light and accommodation. No scleral icterus. Extraocular muscles intact.  HEENT: Head atraumatic, normocephalic. Oropharynx and nasopharynx clear.  NECK:  Supple, no jugular venous distention. No thyroid enlargement, no tenderness.  LUNGS: Normal breath sounds bilaterally, no wheezing, rales,rhonchi or crepitation. No use of accessory muscles of respiration.  CARDIOVASCULAR: S1, S2 , bradycardic. No murmurs, rubs, or gallops.  ABDOMEN: Soft, nontender, nondistended. Bowel sounds present. No organomegaly or mass.  EXTREMITIES: No pedal edema, cyanosis, or clubbing.  NEUROLOGIC: Cranial nerves II through XII are intact. Muscle strength 5/5 in all extremities. Sensation intact. Gait not checked. Mild right upper extremity weakness PSYCHIATRIC: The patient is alert and oriented x 3.  SKIN: No obvious rash, lesion, or ulcer.   ORDERS/RESULTS REVIEWED:   CBC  Recent Labs Lab 07/03/16 0947 07/04/16 0503  WBC 5.0 3.5*  HGB 11.8* 11.5*  HCT 35.8 34.1*  PLT 122* 94*  MCV 87.2 87.6  MCH 28.7 29.5  MCHC 32.9 33.7  RDW 16.5* 15.8*  LYMPHSABS 1.1  --   MONOABS 0.4  --   EOSABS 0.2  --   BASOSABS 0.0  --    ------------------------------------------------------------------------------------------------------------------  Chemistries   Recent Labs Lab 07/03/16 0947 07/04/16 0503  NA 143 145  K 5.4* 4.3  CL 114* 112*  CO2 22 27  GLUCOSE 97 76  BUN 38* 36*  CREATININE 1.81* 1.46*  CALCIUM 8.4* 8.2*  AST 24  --   ALT 9*  --  ALKPHOS 101  --   BILITOT 0.5  --     ------------------------------------------------------------------------------------------------------------------ estimated creatinine clearance is 29 mL/min (by C-G formula based on SCr of 1.46 mg/dL (H)). ------------------------------------------------------------------------------------------------------------------  Recent Labs  07/04/16 0503  TSH 2.943    Cardiac Enzymes  Recent Labs Lab 07/03/16 0947  TROPONINI <0.03   ------------------------------------------------------------------------------------------------------------------ Invalid input(s): POCBNP ---------------------------------------------------------------------------------------------------------------  RADIOLOGY: Ct Head Wo Contrast  Result Date: 07/03/2016 CLINICAL DATA:  Code stroke. Mental status changes beginning 1.5 hours ago. EXAM: CT HEAD WITHOUT CONTRAST TECHNIQUE: Contiguous axial images were obtained from the base of the skull through the vertex without intravenous contrast. COMPARISON:  12/31/2015 FINDINGS: Brain: The brain shows generalized atrophy. There chronic small-vessel ischemic changes of the deep and subcortical white matter. No sign of acute infarction, mass lesion, hemorrhage, hydrocephalus or extra-axial collection. No hyperdense vessels. Vascular: No hyperdense vessels. Skull: Normal Sinuses/Orbits: Clear Other: None significant IMPRESSION: Atrophy and chronic small-vessel ischemic changes. No acute finding. These results were called by telephone at the time of interpretation on 07/03/2016 at 10:10 am to Dr. Nita SickleAROLINA VERONESE , who verbally acknowledged these results. Electronically Signed   By: Paulina FusiMark  Shogry M.D.   On: 07/03/2016 10:13   Mr Brain Wo Contrast  Result Date: 07/03/2016 CLINICAL DATA:  Tremors an worsening right-sided weakness. EXAM: MRI HEAD WITHOUT CONTRAST TECHNIQUE: Multiplanar, multiecho pulse sequences of the brain and surrounding structures were obtained without  intravenous contrast. COMPARISON:  CT same day.  MRI 08/28/2015. FINDINGS: Brain: Diffusion imaging does not show any acute or subacute infarction. The brainstem is normal. No focal cerebellar insult. Cerebral hemispheres show atrophy with chronic small-vessel ischemic changes of the white matter. No large vessel territory infarction. No mass lesion, hemorrhage, hydrocephalus or extra-axial collection. Vascular: Major vessels at the base of the brain show flow. Skull and upper cervical spine: Negative Sinuses/Orbits: Clear Other: No significant finding. IMPRESSION: No acute finding. Atrophy and chronic small vessel disease. No cause of the presenting symptoms is identified. Electronically Signed   By: Paulina FusiMark  Shogry M.D.   On: 07/03/2016 15:24   [place] Carotid Bilateral  Result Date: 07/03/2016 CLINICAL DATA:  CVA. EXAM: BILATERAL CAROTID DUPLEX ULTRASOUND TECHNIQUE: Wallace CullensGray scale imaging, color Doppler and duplex ultrasound were performed of bilateral carotid and vertebral arteries in the neck. COMPARISON:  08/28/2015 FINDINGS: Criteria: Quantification of carotid stenosis is based on velocity parameters that correlate the residual internal carotid diameter with NASCET-based stenosis levels, using the diameter of the distal internal carotid lumen as the denominator for stenosis measurement. The following velocity measurements were obtained: RIGHT ICA:  110 cm/sec CCA:  86 cm/sec SYSTOLIC ICA/CCA RATIO:  1.3 DIASTOLIC ICA/CCA RATIO:  0.7 ECA:  72 cm/sec LEFT ICA:  79 cm/sec CCA:  68 cm/sec SYSTOLIC ICA/CCA RATIO:  1.2 DIASTOLIC ICA/CCA RATIO:  1.6 ECA:  55 cm/sec RIGHT CAROTID ARTERY: Small amount of echogenic plaque in the distal common carotid artery and bulb. Small amount of echogenic plaque in the proximal internal carotid artery without significant stenosis. Normal waveforms and velocities in the right internal carotid artery. RIGHT VERTEBRAL ARTERY: Antegrade flow and normal waveform in the right vertebral artery.  LEFT CAROTID ARTERY: Small amount of echogenic plaque at the left carotid bulb. Small amount of echogenic plaque in the proximal internal carotid artery. Normal waveforms and velocities in left internal carotid artery. LEFT VERTEBRAL ARTERY: Antegrade flow and normal waveform in the left vertebral artery. IMPRESSION: Mild atherosclerotic disease in the carotid arteries bilaterally. Estimated degree of stenosis in the internal  carotid arteries is less than 50% bilaterally. Patent vertebral arteries with antegrade flow. Electronically Signed   By: Richarda Overlie M.D.   On: 07/03/2016 12:21    EKG:  Orders placed or performed during the hospital encounter of 07/03/16  . ED EKG  . ED EKG  . EKG 12-Lead  . EKG 12-Lead  . EKG 12-Lead  . EKG 12-Lead    ASSESSMENT AND PLAN:  Principal Problem:   TIA (transient ischemic attack) Active Problems:   Hyperkalemia   Bradycardia   Parkinson disease (HCC)   HTN (hypertension)   HLD (hyperlipidemia)   CKD (chronic kidney disease), stage III   Pericardial effusion   Sinus bradycardia #1. Generalized weakness, suspected near syncope, and likely TIA or stroke, suspected bradycardia related, although patient's blood pressure remains stable, getting cardiologist involved recommendations. The TSH was normal #2. Essential hypertension, continue Norvasc for now, add hydralazine/Imdur as needed #3. Acute renal insufficiency, improved with IV fluid administration, follow in the morning, urinalysis was unremarkable, unlikely UTI #4. Hyperkalemia, resolved, likely acute renal insufficiency related #5. Pancytopenia, etiology is unclear, follow in the morning, discontinue Lovenox, get HIT antibodies Management plans discussed with the patient, family and they are in agreement.   DRUG ALLERGIES:  Allergies  Allergen Reactions  . Penicillins Other (See Comments)    Reaction:  Unknown   . Plavix [Clopidogrel Bisulfate] Other (See Comments)    Reaction:  Unknown      CODE STATUS:     Code Status Orders        Start     Ordered   07/03/16 1104  Full code  Continuous     07/03/16 1104    Code Status History    Date Active Date Inactive Code Status Order ID Comments User Context   07/03/2016 11:04 AM 07/03/2016  5:26 PM Full Code [id number]  Milagros Loll, MD ED   12/31/2015  5:42 PM 01/06/2016  7:12 PM Full Code [id number]  [name], MD Inpatient   09/13/2015 10:55 PM 09/15/2015  8:25 PM Full Code [id number]  Oralia Manis, MD Inpatient   08/28/2015  8:14 AM 08/29/2015  4:49 PM DNR [id number]  Delfino Lovett, MD Inpatient   08/27/2015 11:25 PM 08/28/2015  8:14 AM Full Code [id number]  Wyatt Haste, MD ED    Advance Directive Documentation   Flowsheet Row Most Recent Value  Type of Advance Directive  Out of facility DNR (pink MOST or yellow form)  Pre-existing out of facility DNR order (yellow form or pink MOST form)  Yellow form placed in chart (order not valid for inpatient use)  "MOST" Form in Place?  No data      TOTAL TIME TAKING CARE OF THIS PATIENT: 40  minutes.    Katharina Caper M.D on 07/04/2016 at 2:43 PM  Between 7am to 6pm - Pager - [phone]  After 6pm go to [email or web address] - password [email or web address] [facility]  [place] [facility] [facility]  Office  [phone]  CC: Primary care physician; [name] BETH, PA-C

## 2016-07-05 ENCOUNTER — Inpatient Hospital Stay
Admission: EM | Admit: 2016-07-05 | Discharge: 2016-07-09 | DRG: 291 | Disposition: A | Discharge status: Home Health | Payer: Medicare Other | Attending: Internal Medicine | Admitting: Internal Medicine

## 2016-07-05 ENCOUNTER — Encounter: Payer: Self-pay | Admitting: Emergency Medicine

## 2016-07-05 ENCOUNTER — Emergency Department: Payer: Medicare Other

## 2016-07-05 DIAGNOSIS — Z9981 Dependence on supplemental oxygen: Secondary | ICD-10-CM

## 2016-07-05 DIAGNOSIS — J9811 Atelectasis: Secondary | ICD-10-CM | POA: Diagnosis present

## 2016-07-05 DIAGNOSIS — J9601 Acute respiratory failure with hypoxia: Secondary | ICD-10-CM | POA: Diagnosis not present

## 2016-07-05 DIAGNOSIS — N390 Urinary tract infection, site not specified: Secondary | ICD-10-CM

## 2016-07-05 DIAGNOSIS — E869 Volume depletion, unspecified: Secondary | ICD-10-CM | POA: Diagnosis present

## 2016-07-05 DIAGNOSIS — R0902 Hypoxemia: Secondary | ICD-10-CM | POA: Diagnosis not present

## 2016-07-05 DIAGNOSIS — J45909 Unspecified asthma, uncomplicated: Secondary | ICD-10-CM | POA: Diagnosis present

## 2016-07-05 DIAGNOSIS — I5031 Acute diastolic (congestive) heart failure: Secondary | ICD-10-CM | POA: Diagnosis present

## 2016-07-05 DIAGNOSIS — I313 Pericardial effusion (noninflammatory): Secondary | ICD-10-CM | POA: Diagnosis present

## 2016-07-05 DIAGNOSIS — G459 Transient cerebral ischemic attack, unspecified: Secondary | ICD-10-CM | POA: Diagnosis present

## 2016-07-05 DIAGNOSIS — Z7982 Long term (current) use of aspirin: Secondary | ICD-10-CM | POA: Diagnosis not present

## 2016-07-05 DIAGNOSIS — R7981 Abnormal blood-gas level: Secondary | ICD-10-CM | POA: Diagnosis not present

## 2016-07-05 DIAGNOSIS — G20 Parkinson's disease: Secondary | ICD-10-CM | POA: Diagnosis present

## 2016-07-05 DIAGNOSIS — I69320 Aphasia following cerebral infarction: Secondary | ICD-10-CM | POA: Diagnosis not present

## 2016-07-05 DIAGNOSIS — I319 Disease of pericardium, unspecified: Secondary | ICD-10-CM | POA: Diagnosis not present

## 2016-07-05 DIAGNOSIS — J90 Pleural effusion, not elsewhere classified: Secondary | ICD-10-CM | POA: Diagnosis not present

## 2016-07-05 DIAGNOSIS — J9600 Acute respiratory failure, unspecified whether with hypoxia or hypercapnia: Secondary | ICD-10-CM

## 2016-07-05 DIAGNOSIS — I272 Other secondary pulmonary hypertension: Secondary | ICD-10-CM | POA: Diagnosis present

## 2016-07-05 DIAGNOSIS — N179 Acute kidney failure, unspecified: Secondary | ICD-10-CM | POA: Diagnosis present

## 2016-07-05 DIAGNOSIS — J9621 Acute and chronic respiratory failure with hypoxia: Secondary | ICD-10-CM | POA: Diagnosis present

## 2016-07-05 DIAGNOSIS — Z66 Do not resuscitate: Secondary | ICD-10-CM | POA: Diagnosis present

## 2016-07-05 DIAGNOSIS — I3139 Other pericardial effusion (noninflammatory): Secondary | ICD-10-CM | POA: Diagnosis present

## 2016-07-05 DIAGNOSIS — I130 Hypertensive heart and chronic kidney disease with heart failure and stage 1 through stage 4 chronic kidney disease, or unspecified chronic kidney disease: Principal | ICD-10-CM | POA: Diagnosis present

## 2016-07-05 DIAGNOSIS — E785 Hyperlipidemia, unspecified: Secondary | ICD-10-CM | POA: Diagnosis present

## 2016-07-05 DIAGNOSIS — Z8249 Family history of ischemic heart disease and other diseases of the circulatory system: Secondary | ICD-10-CM

## 2016-07-05 DIAGNOSIS — I69351 Hemiplegia and hemiparesis following cerebral infarction affecting right dominant side: Secondary | ICD-10-CM | POA: Diagnosis not present

## 2016-07-05 DIAGNOSIS — E875 Hyperkalemia: Secondary | ICD-10-CM | POA: Diagnosis present

## 2016-07-05 DIAGNOSIS — R001 Bradycardia, unspecified: Secondary | ICD-10-CM | POA: Diagnosis present

## 2016-07-05 DIAGNOSIS — F0390 Unspecified dementia without behavioral disturbance: Secondary | ICD-10-CM | POA: Diagnosis present

## 2016-07-05 DIAGNOSIS — R609 Edema, unspecified: Secondary | ICD-10-CM

## 2016-07-05 DIAGNOSIS — I471 Supraventricular tachycardia: Secondary | ICD-10-CM | POA: Diagnosis present

## 2016-07-05 DIAGNOSIS — N183 Chronic kidney disease, stage 3 unspecified: Secondary | ICD-10-CM | POA: Diagnosis present

## 2016-07-05 DIAGNOSIS — R531 Weakness: Secondary | ICD-10-CM

## 2016-07-05 DIAGNOSIS — D696 Thrombocytopenia, unspecified: Secondary | ICD-10-CM | POA: Diagnosis present

## 2016-07-05 DIAGNOSIS — F329 Major depressive disorder, single episode, unspecified: Secondary | ICD-10-CM | POA: Diagnosis present

## 2016-07-05 DIAGNOSIS — Z79899 Other long term (current) drug therapy: Secondary | ICD-10-CM

## 2016-07-05 DIAGNOSIS — Z888 Allergy status to other drugs, medicaments and biological substances status: Secondary | ICD-10-CM

## 2016-07-05 DIAGNOSIS — Z880 Allergy status to penicillin: Secondary | ICD-10-CM

## 2016-07-05 LAB — CBC
HEMATOCRIT: 32.8 % — AB (ref 35.0–47.0)
Hemoglobin: 11.2 g/dL — ABNORMAL LOW (ref 12.0–16.0)
MCH: 29.7 pg (ref 26.0–34.0)
MCHC: 34 g/dL (ref 32.0–36.0)
MCV: 87.2 fL (ref 80.0–100.0)
Platelets: 97 10*3/uL — ABNORMAL LOW (ref 150–440)
RBC: 3.76 MIL/uL — ABNORMAL LOW (ref 3.80–5.20)
RDW: 16.1 % — AB (ref 11.5–14.5)
WBC: 4.4 10*3/uL (ref 3.6–11.0)

## 2016-07-05 LAB — BASIC METABOLIC PANEL
Anion gap: 4 — ABNORMAL LOW (ref 5–15)
Anion gap: 4 — ABNORMAL LOW (ref 5–15)
BUN: 32 mg/dL — AB (ref 6–20)
BUN: 32 mg/dL — AB (ref 6–20)
CALCIUM: 8.5 mg/dL — AB (ref 8.9–10.3)
CHLORIDE: 110 mmol/L (ref 101–111)
CO2: 29 mmol/L (ref 22–32)
CO2: 31 mmol/L (ref 22–32)
CREATININE: 1.5 mg/dL — AB (ref 0.44–1.00)
Calcium: 8.4 mg/dL — ABNORMAL LOW (ref 8.9–10.3)
Chloride: 109 mmol/L (ref 101–111)
Creatinine, Ser: 1.38 mg/dL — ABNORMAL HIGH (ref 0.44–1.00)
GFR calc Af Amer: 40 mL/min — ABNORMAL LOW (ref >60)
GFR calc Af Amer: 36 mL/min — ABNORMAL LOW (ref >60)
GFR calc non Af Amer: 35 mL/min — ABNORMAL LOW (ref >60)
GFR, EST NON AFRICAN AMERICAN: 31 mL/min — AB (ref >60)
GLUCOSE: 83 mg/dL (ref 65–99)
GLUCOSE: 112 mg/dL — AB (ref 65–99)
POTASSIUM: 4.4 mmol/L (ref 3.5–5.1)
Potassium: 4.6 mmol/L (ref 3.5–5.1)
SODIUM: 144 mmol/L (ref 135–145)
Sodium: 143 mmol/L (ref 135–145)

## 2016-07-05 LAB — CBC WITH DIFFERENTIAL/PLATELET
BASOS PCT: 1 %
Basophils Absolute: 0 10*3/uL (ref 0–0.1)
EOS ABS: 0.1 10*3/uL (ref 0–0.7)
EOS PCT: 2 %
HCT: 36.4 % (ref 35.0–47.0)
Hemoglobin: 12.4 g/dL (ref 12.0–16.0)
LYMPHS ABS: 1.1 10*3/uL (ref 1.0–3.6)
Lymphocytes Relative: 23 %
MCH: 29.4 pg (ref 26.0–34.0)
MCHC: 34.1 g/dL (ref 32.0–36.0)
MCV: 86.3 fL (ref 80.0–100.0)
MONOS PCT: 7 %
Monocytes Absolute: 0.3 10*3/uL (ref 0.2–0.9)
Neutro Abs: 3.2 10*3/uL (ref 1.4–6.5)
Neutrophils Relative %: 67 %
PLATELETS: 120 10*3/uL — AB (ref 150–440)
RBC: 4.22 MIL/uL (ref 3.80–5.20)
RDW: 15.9 % — AB (ref 11.5–14.5)
WBC: 4.7 10*3/uL (ref 3.6–11.0)

## 2016-07-05 LAB — TROPONIN I
Troponin I: <0.03 ng/mL (ref <0.03)
Troponin I: <0.03 ng/mL (ref <0.03)

## 2016-07-05 LAB — HEPARIN INDUCED PLATELET AB (HIT ANTIBODY): Heparin Induced Plt Ab: 0.186 OD (ref 0.000–0.400)

## 2016-07-05 LAB — BRAIN NATRIURETIC PEPTIDE: B NATRIURETIC PEPTIDE 5: 197 pg/mL — AB (ref 0.0–100.0)

## 2016-07-05 LAB — URINE CULTURE

## 2016-07-05 LAB — MRSA PCR SCREENING: MRSA BY PCR: NEGATIVE

## 2016-07-05 LAB — SEDIMENTATION RATE: Sed Rate: 11 mm/hr (ref 0–30)

## 2016-07-05 MED ORDER — ASPIRIN EC 81 MG PO TBEC
81.0000 mg | DELAYED_RELEASE_TABLET | Freq: Every day | ORAL | Status: DC
  Administered 2016-07-05 – 2016-07-09 (×5): 81 mg via ORAL
  Filled 2016-07-05 (×5): qty 1

## 2016-07-05 MED ORDER — ESCITALOPRAM OXALATE 10 MG PO TABS
10.0000 mg | ORAL_TABLET | Freq: Every day | ORAL | Status: DC
  Administered 2016-07-05 – 2016-07-09 (×5): 10 mg via ORAL
  Filled 2016-07-05 (×6): qty 1

## 2016-07-05 MED ORDER — ONDANSETRON HCL 4 MG/2ML IJ SOLN: 4.0000 mg | Freq: Four times a day (QID) | INTRAMUSCULAR | Status: DC | PRN

## 2016-07-05 MED ORDER — AMLODIPINE BESYLATE 5 MG PO TABS
10.0000 mg | ORAL_TABLET | Freq: Every day | ORAL | Status: DC
  Administered 2016-07-05 – 2016-07-09 (×5): 10 mg via ORAL
  Filled 2016-07-05 (×5): qty 2

## 2016-07-05 MED ORDER — QUETIAPINE FUMARATE 25 MG PO TABS
25.0000 mg | ORAL_TABLET | Freq: Every day | ORAL | Status: DC
  Administered 2016-07-05 – 2016-07-08 (×4): 25 mg via ORAL
  Filled 2016-07-05 (×4): qty 1

## 2016-07-05 MED ORDER — FERROUS SULFATE 325 (65 FE) MG PO TABS
325.0000 mg | ORAL_TABLET | Freq: Two times a day (BID) | ORAL | Status: DC
  Administered 2016-07-05 – 2016-07-09 (×8): 325 mg via ORAL
  Filled 2016-07-05 (×8): qty 1

## 2016-07-05 MED ORDER — CIPROFLOXACIN HCL 250 MG PO TABS: 250.0000 mg | ORAL_TABLET | Freq: Two times a day (BID) | ORAL | 0 refills | Status: DC

## 2016-07-05 MED ORDER — CARBIDOPA-LEVODOPA 25-100 MG PO TABS
1.0000 | ORAL_TABLET | Freq: Three times a day (TID) | ORAL | Status: DC
  Administered 2016-07-05 – 2016-07-09 (×11): 1 via ORAL
  Filled 2016-07-05 (×12): qty 1

## 2016-07-05 MED ORDER — MIRTAZAPINE 15 MG PO TABS
15.0000 mg | ORAL_TABLET | Freq: Every day | ORAL | Status: DC
  Administered 2016-07-05 – 2016-07-08 (×4): 15 mg via ORAL
  Filled 2016-07-05 (×4): qty 1

## 2016-07-05 MED ORDER — IPRATROPIUM-ALBUTEROL 0.5-2.5 (3) MG/3ML IN SOLN
RESPIRATORY_TRACT | Status: AC
  Administered 2016-07-05: 3 mL via RESPIRATORY_TRACT
  Filled 2016-07-05: qty 3

## 2016-07-05 MED ORDER — ACETAMINOPHEN 325 MG PO TABS: 650.0000 mg | ORAL_TABLET | Freq: Four times a day (QID) | ORAL | Status: DC | PRN

## 2016-07-05 MED ORDER — ATORVASTATIN CALCIUM 20 MG PO TABS
10.0000 mg | ORAL_TABLET | Freq: Every day | ORAL | Status: DC
  Administered 2016-07-05 – 2016-07-08 (×4): 10 mg via ORAL
  Filled 2016-07-05 (×4): qty 1

## 2016-07-05 MED ORDER — VITAMIN B-12 1000 MCG PO TABS
1000.0000 ug | ORAL_TABLET | Freq: Every day | ORAL | Status: DC
  Administered 2016-07-06 – 2016-07-09 (×4): 1000 ug via ORAL
  Filled 2016-07-05 (×4): qty 1

## 2016-07-05 MED ORDER — DICLOFENAC SODIUM 1 % TD GEL
2.0000 g | Freq: Two times a day (BID) | TRANSDERMAL | Status: DC
  Administered 2016-07-05 – 2016-07-08 (×5): 2 g via TOPICAL
  Filled 2016-07-05: qty 100

## 2016-07-05 MED ORDER — BISACODYL 10 MG RE SUPP: 10.0000 mg | Freq: Every day | RECTAL | Status: DC | PRN

## 2016-07-05 MED ORDER — ACETAMINOPHEN 650 MG RE SUPP: 650.0000 mg | Freq: Four times a day (QID) | RECTAL | Status: DC | PRN

## 2016-07-05 MED ORDER — IPRATROPIUM-ALBUTEROL 0.5-2.5 (3) MG/3ML IN SOLN
3.0000 mL | Freq: Four times a day (QID) | RESPIRATORY_TRACT | Status: DC
  Administered 2016-07-05 – 2016-07-06 (×5): 3 mL via RESPIRATORY_TRACT
  Filled 2016-07-05 (×5): qty 3

## 2016-07-05 MED ORDER — LEVETIRACETAM 500 MG PO TABS
500.0000 mg | ORAL_TABLET | Freq: Two times a day (BID) | ORAL | Status: DC
  Administered 2016-07-05 – 2016-07-09 (×8): 500 mg via ORAL
  Filled 2016-07-05 (×8): qty 1

## 2016-07-05 MED ORDER — CIPROFLOXACIN HCL 500 MG PO TABS
250.0000 mg | ORAL_TABLET | Freq: Two times a day (BID) | ORAL | Status: DC
  Administered 2016-07-05: 250 mg via ORAL
  Filled 2016-07-05 (×2): qty 0.5

## 2016-07-05 MED ORDER — ONDANSETRON HCL 4 MG PO TABS: 4.0000 mg | ORAL_TABLET | Freq: Four times a day (QID) | ORAL | Status: DC | PRN

## 2016-07-05 MED ORDER — DOCUSATE SODIUM 100 MG PO CAPS
100.0000 mg | ORAL_CAPSULE | Freq: Two times a day (BID) | ORAL | Status: DC
  Administered 2016-07-05 – 2016-07-09 (×8): 100 mg via ORAL
  Filled 2016-07-05 (×8): qty 1

## 2016-07-05 MED ORDER — FAMOTIDINE IN NACL 20-0.9 MG/50ML-% IV SOLN
20.0000 mg | Freq: Two times a day (BID) | INTRAVENOUS | Status: DC
  Administered 2016-07-05: 20 mg via INTRAVENOUS
  Filled 2016-07-05 (×3): qty 50

## 2016-07-05 MED ORDER — HEPARIN SODIUM (PORCINE) 5000 UNIT/ML IJ SOLN
5000.0000 [IU] | Freq: Three times a day (TID) | INTRAMUSCULAR | Status: DC
  Administered 2016-07-05 – 2016-07-09 (×11): 5000 [IU] via SUBCUTANEOUS
  Filled 2016-07-05 (×11): qty 1

## 2016-07-05 MED ORDER — SODIUM CHLORIDE 0.9% FLUSH
3.0000 mL | Freq: Two times a day (BID) | INTRAVENOUS | Status: DC
  Administered 2016-07-05 – 2016-07-09 (×8): 3 mL via INTRAVENOUS

## 2016-07-05 NOTE — Progress Notes (Signed)
[facility] here for transport with pt released to them. [facility]

## 2016-07-05 NOTE — ED Provider Notes (Addendum)
[facility] Emergency Department Provider Note  ____________________________________________   I have reviewed the triage vital signs and the nursing notes.   HISTORY  Chief Complaint Shortness of Breath    HPI Stacey Baker is a 82 y.o. female who was discharged this morning after a workup for possible CVA. She was noted to be somewhat hypoxic for EMS on transport back and also by nursing home. Patient is not apparently on baseline oxygen. She herself has no complaints.History is limited by patient dementia      Past Medical History:  Diagnosis Date  . CKD (chronic kidney disease), stage III    a. With acute worsening/AKI noted on several admissions.  . Dementia   . Depression   . Encephalopathy   . Hyperkalemia    a. 08/2015  . Hyperlipidemia   . Hypertension   . Parkinson disease (HCC)   . Pericardial effusion    a. 06/2016 Echo: Ef 60-65%, no rwma, Gr1 DD, mild MR, mildly dil LA, PASP , mod circumferential pericardial effusion - no hemodynamic compromise.  . Sinus bradycardia    a. 08/2015 in setting of beta blocker and hyperkalemia.  . Stroke (HCC)   . TIA (transient ischemic attack)    a. 08/2015 - essentially neg w/u for stroke.    Patient Active Problem List   Diagnosis Date Noted  . Generalized weakness 07/05/2016  . Thrombocytopenia (HCC) 07/05/2016  . UTI (urinary tract infection) 07/05/2016  . CKD (chronic kidney disease), stage III   . Pericardial effusion   . Sinus bradycardia   . Acute renal failure (ARF) (HCC) 12/31/2015  . Chronic renal insufficiency 09/15/2015  . Bradycardia 09/15/2015  . Cellulitis 09/13/2015  . Parkinson disease (HCC) 09/13/2015  . HTN (hypertension) 09/13/2015  . HLD (hyperlipidemia) 09/13/2015  . Depression 09/13/2015  . Dementia 09/13/2015  . Hyperkalemia 09/13/2015  . TIA (transient ischemic attack) 08/27/2015    Past Surgical History:  Procedure Laterality Date  . ABDOMINAL  HYSTERECTOMY    . CHOLECYSTECTOMY      Prior to Admission medications   Medication Sig Start Date End Date Taking? Authorizing Provider  amLODipine (NORVASC) 10 MG tablet Take 1 tablet (10 mg total) by mouth daily. 09/15/15   Katharina Caper, MD  aspirin EC 81 MG tablet Take 81 mg by mouth daily.    Historical Provider, MD  atorvastatin (LIPITOR) 10 MG tablet Take 10 mg by mouth at bedtime.    Historical Provider, MD  carbidopa-levodopa (SINEMET IR) 25-100 MG tablet Take 1 tablet by mouth 3 (three) times daily.    Historical Provider, MD  ciprofloxacin (CIPRO) 250 MG tablet Take 1 tablet (250 mg total) by mouth 2 (two) times daily. 07/05/16   Katharina Caper, MD  diclofenac sodium (VOLTAREN) 1 % GEL Apply 2 g topically 2 (two) times daily. Pt applies to left knee.    Historical Provider, MD  escitalopram (LEXAPRO) 10 MG tablet Take 10 mg by mouth daily.    Historical Provider, MD  ferrous sulfate 325 (65 FE) MG tablet Take 325 mg by mouth 2 (two) times daily.    Historical Provider, MD  levETIRAcetam (KEPPRA) 500 MG tablet Take 500 mg by mouth 2 (two) times daily.    Historical Provider, MD  mirtazapine (REMERON) 15 MG tablet Take 15 mg by mouth at bedtime.    Historical Provider, MD  QUEtiapine (SEROQUEL) 25 MG tablet Take 25 mg by mouth at bedtime.    Historical Provider, MD  vitamin B-12 (CYANOCOBALAMIN)  1000 MCG tablet Take 1,000 mcg by mouth daily.    Historical Provider, MD    Allergies Penicillins and Plavix [clopidogrel bisulfate]  Family History  Problem Relation Age of Onset  . Heart disease Mother   . Hypertension Other     Social History Social History  Substance Use Topics  . Smoking status: Never Smoker  . Smokeless tobacco: Never Used  . Alcohol use No    Review of Systems Constitutional: No fever/chills Eyes: No visual changes. ENT: No sore throat. No stiff neck no neck pain Cardiovascular: Denies chest pain. Respiratory: Denies shortness of  breath. Gastrointestinal:   no vomiting.  No diarrhea.  No constipation. Genitourinary: Negative for dysuria. Musculoskeletal: Negative lower extremity swelling Skin: Negative for rash. Neurological: Negative for severe headaches, focal weakness or numbness. 10-point ROS otherwise negative.  ____________________________________________   PHYSICAL EXAM:  VITAL SIGNS: ED Triage Vitals  Enc Vitals Group     BP 07/05/16 1524 134/72     Pulse Rate 07/05/16 1524 (!) 58     Resp 07/05/16 1524 18     Temp 07/05/16 1524 98.4 F (36.9 C)     Temp Source 07/05/16 1524 Oral     SpO2 07/05/16 1522 (!) 87 %     Weight 07/05/16 1525 160 lb (72.6 kg)     Height 07/05/16 1525 5\' 3"  (1.6 m)     Head Circumference --      Peak Flow --      Pain Score 07/05/16 1525 0     Pain Loc --      Pain Edu? --      Excl. in GC? --     Constitutional: Alert and orientedTo name and place unsure of the date. Well appearing and in no acute distress. Eyes: Conjunctivae are normal. PERRL. EOMI. Head: Atraumatic. Nose: No congestion/rhinnorhea. Mouth/Throat: Mucous membranes are moist.  Oropharynx non-erythematous. Neck: No stridor.   Nontender with no meningismus Cardiovascular: Normal rate, regular rhythm. Grossly normal heart sounds.  Good peripheral circulation. Respiratory: Normal respiratory effort.  No retractions. Lungs medicine the bases but no rales or rhonchi Abdominal: Soft and nontender. No distention. No guarding no rebound Back:  There is no focal tenderness or step off.  there is no midline tenderness there are no lesions noted. there is no CVA tenderness Musculoskeletal: No lower extremity tenderness, no upper extremity tenderness. No joint effusions, no DVT signs strong distal pulses no edema Neurologic:  Normal speech and language. No gross focal neurologic deficits are appreciated.  Skin:  Skin is warm, dry and intact. No rash noted. Psychiatric: Mood and affect are normal. Speech and  behavior are normal.  ____________________________________________   LABS (all labs ordered are listed, but only abnormal results are displayed)  Labs Reviewed  CBC WITH DIFFERENTIAL/PLATELET - Abnormal; Notable for the following:       Result Value   RDW 15.9 (*)    Platelets 120 (*)    All other components within normal limits  BRAIN NATRIURETIC PEPTIDE  BASIC METABOLIC PANEL  TROPONIN I   ____________________________________________  EKG  I personally interpreted any EKGs ordered by me or triageSinus rhythm rate 57 bpm no acute ST elevation or depression, normal axis no acute ischemia ____________________________________________  RADIOLOGY  I reviewed any imaging ordered by me or triage that were performed during my shift and, if possible, patient and/or family made aware of any abnormal findings. ____________________________________________   PROCEDURES  Procedure(s) performed: None  Procedures  Critical Care  performed: None  ____________________________________________   INITIAL IMPRESSION / ASSESSMENT AND PLAN / ED COURSE  Pertinent labs & imaging results that were available during my care of the patient were reviewed by me and considered in my medical decision making (see chart for details).  Patient with hypoxia upon discharge. She has bilateral pleural effusions which are new from last x-ray I suspect this to be the cause of certainly fits with her clinical picture. I have instituted a workup and we will admit back to the hospitalist service whence she was discharged. She is  very well appearing at this time.  Clinical Course   ____________________________________________   FINAL CLINICAL IMPRESSION(S) / ED DIAGNOSES  Final diagnoses:  None      This chart was dictated using voice recognition software.  Despite best efforts to proofread,  errors can occur which can change meaning.      Jeanmarie PlantJames A Leanor Voris, MD 07/05/16 1718    Jeanmarie PlantJames A Lavante Toso,  MD 07/05/16 [id number]

## 2016-07-05 NOTE — Discharge Summary (Signed)
[facility] [place] [facility]   PATIENT NAME: Stacey Baker    MR#:  [id number]  DATE OF BIRTH:  [birth year]  DATE OF ADMISSION:  07/03/2016 ADMITTING PHYSICIAN: Milagros Loll, MD  DATE OF DISCHARGE: No discharge date for patient encounter.  PRIMARY CARE PHYSICIAN: MCGRANAGHAN, MARY BETH, PA-C     ADMISSION DIAGNOSIS:  Hyperkalemia [E87.5] CVA (cerebral infarction) [I63.9] AKI (acute kidney injury) (HCC) [N17.9] Right sided weakness [M62.89] Transient cerebral ischemia, unspecified transient cerebral ischemia type [G45.9]  DISCHARGE DIAGNOSIS:  Principal Problem:   TIA (transient ischemic attack) Active Problems:   Hyperkalemia   Bradycardia   Pericardial effusion   Sinus bradycardia   UTI (urinary tract infection)   Parkinson disease (HCC)   HTN (hypertension)   HLD (hyperlipidemia)   CKD (chronic kidney disease), stage III   Generalized weakness   Thrombocytopenia (HCC)   SECONDARY DIAGNOSIS:   Past Medical History:  Diagnosis Date  . CKD (chronic kidney disease), stage III    a. With acute worsening/AKI noted on several admissions.  . Dementia   . Depression   . Encephalopathy   . Hyperkalemia    a. 08/2015  . Hyperlipidemia   . Hypertension   . Parkinson disease (HCC)   . Pericardial effusion    a. 06/2016 Echo: Ef 60-65%, no rwma, Gr1 DD, mild MR, mildly dil LA, PASP , mod circumferential pericardial effusion - no hemodynamic compromise.  . Sinus bradycardia    a. 08/2015 in setting of beta blocker and hyperkalemia.  . Stroke (HCC)   . TIA (transient ischemic attack)    a. 08/2015 - essentially neg w/u for stroke.     HOSPITAL COURSE:   The patient is a 82 year old Caucasian female with past medical history significant for history of CK D stage III, dementia, depression, hyperlipidemia, hypertension, Parkinson's disease, stroke with residual right-sided weakness, who presents to the hospital with complaints of  worsening right-sided weakness and tremors. Apparently patient was found slumped onto the right side, she was brought to the hospital for further evaluation. CT scan of the head revealed no acute changes, she was admitted for possible TIA. She underwent stroke workup, including MRI of the brain, revealing no acute findings, atrophy and chronic small vessel disease, ultrasound of carotid arteries, revealing mild atherosclerotic disease in the carotid arteries bilaterally, estimated degree of stenosis in the internal carotid arteries with less than 50% bilaterally, echocardiogram showed moderate sized pericardial effusion, normal ejection fraction, no wall motion abnormalities, severely elevated pulmonary arterial pressures. The patient was also noted to be bradycardic with heart rate dipping down to 30s to 40s intermittently, however, patient's blood pressure was maintained. Cardiology consultation was obtained, recommended no therapy for pericardial effusion,to repeat echocardiogram as outpatient<  Holter monitor was recommended to be placed prior to discharge. The patient will be evaluated by physical therapist and recommendations will be made in regards to discharge. Speech therapist saw patient in consultation and recommended regular diet with thin liquids. Discussion by problem: #1. Generalized weakness, ?near syncope,  TIA , but no stroke, physical therapist evaluated patient and decide about discharge planning. Stroke workup did not yield stroke, carotid ultrasound revealed less than 50% stenosis in internal carotid arteries, echocardiogram showed normal ejection fraction, no wall motion abnormalities, elevated pulmonary arterial pressure, pericardial effusion, moderate sized. Cardiology consultation was obtained, outpatient follow-up was recommended. Patient is to continue aspirin, Lipitor #2. Essential hypertension, continue Norvasc , blood pressure is relatively well controlled #3. Acute renal  insufficiency, improved with IV fluid administration, follow as outpatient, urinalysis was unremarkable, however, patient's urine cultures revealed more than 70,000 colony-forming units of gram-negative rods, patient was initiated on Rocephin, Keflex should be continued for 2 more days to complete course, patient's urine cultures should be followed if patient develops additional symptoms related to urinary tract. #4. Hyperkalemia, resolved, likely acute renal insufficiency related #5. Pancytopenia, etiology was unclear,  HIT antibodies are pending, white cell count normalized, platelet count improved, it is recommended to follow CBC as outpatient closely #6, pericardial effusion, cardiology does not recommend treatment, echocardiogram should be repeated as outpatient, patient is to follow-up with Dr. Kirke CorinArida as outpatient. #7. Bradycardia, sinus, patient is not on any calcium channel blockers or beta blockers, they should not be initiated, patient will be placed Holter monitor for 48 hours prior to discharge.  DISCHARGE CONDITIONS:   Stable  CONSULTS OBTAINED:  Treatment Team:  Pauletta BrownsYuriy Zeylikman, MD [place] OuchMuhammad A Arida, MD  DRUG ALLERGIES:   Allergies  Allergen Reactions  . Penicillins Other (See Comments)    Reaction:  Unknown   . Plavix [Clopidogrel Bisulfate] Other (See Comments)    Reaction:  Unknown     DISCHARGE MEDICATIONS:   Current Discharge Medication List    START taking these medications   Details  ciprofloxacin (CIPRO) 250 MG tablet Take 1 tablet (250 mg total) by mouth 2 (two) times daily. Qty: 6 tablet, Refills: 0      CONTINUE these medications which have NOT CHANGED   Details  amLODipine (NORVASC) 10 MG tablet Take 1 tablet (10 mg total) by mouth daily. Qty: 30 tablet, Refills: 5    aspirin EC 81 MG tablet Take 81 mg by mouth daily.    atorvastatin (LIPITOR) 10 MG tablet Take 10 mg by mouth at bedtime.    carbidopa-levodopa (SINEMET IR) 25-100 MG tablet Take 1  tablet by mouth 3 (three) times daily.    diclofenac sodium (VOLTAREN) 1 % GEL Apply 2 g topically 2 (two) times daily. Pt applies to left knee.    escitalopram (LEXAPRO) 10 MG tablet Take 10 mg by mouth daily.    ferrous sulfate 325 (65 FE) MG tablet Take 325 mg by mouth 2 (two) times daily.    levETIRAcetam (KEPPRA) 500 MG tablet Take 500 mg by mouth 2 (two) times daily.    mirtazapine (REMERON) 15 MG tablet Take 15 mg by mouth at bedtime.    QUEtiapine (SEROQUEL) 25 MG tablet Take 25 mg by mouth at bedtime.    vitamin B-12 (CYANOCOBALAMIN) 1000 MCG tablet Take 1,000 mcg by mouth daily.      STOP taking these medications     donepezil (ARICEPT) 5 MG tablet      levofloxacin (LEVAQUIN) 250 MG tablet          DISCHARGE INSTRUCTIONS:    Patient is to follow-up with primary care physician and cardiologist as outpatient  If you experience worsening of your admission symptoms, develop shortness of breath, life threatening emergency, suicidal or homicidal thoughts you must seek medical attention immediately by calling 911 or calling your MD immediately  if symptoms less severe.  You Must read complete instructions/literature along with all the possible adverse reactions/side effects for all the Medicines you take and that have been prescribed to you. Take any new Medicines after you have completely understood and accept all the possible adverse reactions/side effects.   Please note  You were cared for by a hospitalist during your hospital stay. If you  have any questions about your discharge medications or the care you received while you were in the hospital after you are discharged, you can call the unit and asked to speak with the hospitalist on call if the hospitalist that took care of you is not available. Once you are discharged, your primary care physician will handle any further medical issues. Please note that NO REFILLS for any discharge medications will be authorized once  you are discharged, as it is imperative that you return to your primary care physician (or establish a relationship with a primary care physician if you do not have one) for your aftercare needs so that they can reassess your need for medications and monitor your lab values.    Today   CHIEF COMPLAINT:   Chief Complaint  Patient presents with  . Code Stroke    HISTORY OF PRESENT ILLNESS:  Elaria Osias  is a 82 y.o. female with a known history of CK D stage III, dementia, depression, hyperlipidemia, hypertension, Parkinson's disease, stroke with residual right-sided weakness, who presents to the hospital with complaints of worsening right-sided weakness and tremors. Apparently patient was found slumped onto the right side, she was brought to the hospital for further evaluation. CT scan of the head revealed no acute changes, she was admitted for possible TIA. She underwent stroke workup, including MRI of the brain, revealing no acute findings, atrophy and chronic small vessel disease, ultrasound of carotid arteries, revealing mild atherosclerotic disease in the carotid arteries bilaterally, estimated degree of stenosis in the internal carotid arteries with less than 50% bilaterally, echocardiogram showed moderate sized pericardial effusion, normal ejection fraction, no wall motion abnormalities, severely elevated pulmonary arterial pressures. The patient was also noted to be bradycardic with heart rate dipping down to 30s to 40s intermittently, however, patient's blood pressure was maintained. Cardiology consultation was obtained, recommended no therapy for pericardial effusion,to repeat echocardiogram as outpatient<  Holter monitor was recommended to be placed prior to discharge. The patient will be evaluated by physical therapist and recommendations will be made in regards to discharge. Speech therapist saw patient in consultation and recommended regular diet with thin liquids. Discussion by  problem: #1. Generalized weakness, ?near syncope,  TIA , but no stroke, physical therapist evaluated patient and decide about discharge planning. Stroke workup did not yield stroke, carotid ultrasound revealed less than 50% stenosis in internal carotid arteries, echocardiogram showed normal ejection fraction, no wall motion abnormalities, elevated pulmonary arterial pressure, pericardial effusion, moderate sized. Cardiology consultation was obtained, outpatient follow-up was recommended. Patient is to continue aspirin, Lipitor #2. Essential hypertension, continue Norvasc , blood pressure is relatively well controlled #3. Acute renal insufficiency, improved with IV fluid administration, follow as outpatient, urinalysis was unremarkable, however, patient's urine cultures revealed more than 70,000 colony-forming units of gram-negative rods, patient was initiated on Rocephin, Keflex should be continued for 2 more days to complete course, patient's urine cultures should be followed if patient develops additional symptoms related to urinary tract. #4. Hyperkalemia, resolved, likely acute renal insufficiency related #5. Pancytopenia, etiology was unclear,  HIT antibodies are pending, white cell count normalized, platelet count improved, it is recommended to follow CBC as outpatient closely #6, pericardial effusion, cardiology does not recommend treatment, echocardiogram should be repeated as outpatient, patient is to follow-up with Dr. Kirke Corin as outpatient. #7. Bradycardia, sinus, patient is not on any calcium channel blockers or beta blockers, they should not be initiated, patient will be placed Holter monitor for 48 hours prior to discharge.  VITAL SIGNS:  Blood pressure (!) 133/50, pulse (!) 51, temperature 98.7 F (37.1 C), resp. rate 18, height 5\' 3"  (1.6 m), weight 72.4 kg (159 lb 11.2 oz), SpO2 98 %.  I/O:   Intake/Output Summary (Last 24 hours) at 07/05/16 0842 Last data filed at 07/05/16 0500   Gross per 24 hour  Intake              530 ml  Output                0 ml  Net              530 ml    PHYSICAL EXAMINATION:  GENERAL:  82 y.o.-year-old patient lying in the bed with no acute distress.  EYES: Pupils equal, round, reactive to light and accommodation. No scleral icterus. Extraocular muscles intact.  HEENT: Head atraumatic, normocephalic. Oropharynx and nasopharynx clear.  NECK:  Supple, no jugular venous distention. No thyroid enlargement, no tenderness.  LUNGS: Normal breath sounds bilaterally, no wheezing, rales,rhonchi or crepitation. No use of accessory muscles of respiration.  CARDIOVASCULAR: S1, S2 normal. No murmurs, rubs, or gallops.  ABDOMEN: Soft, non-tender, non-distended. Bowel sounds present. No organomegaly or mass.  EXTREMITIES: No pedal edema, cyanosis, or clubbing.  NEUROLOGIC: Cranial nerves II through XII are intact. Muscle strength 5/5 in all extremities. Sensation intact. Gait not checked.  PSYCHIATRIC: The patient is alert and oriented x 3.  SKIN: No obvious rash, lesion, or ulcer.   DATA REVIEW:   CBC  Recent Labs Lab 07/05/16 0444  WBC 4.4  HGB 11.2*  HCT 32.8*  PLT 97*    Chemistries   Recent Labs Lab 07/03/16 0947  07/05/16 0444  NA 143  < > 143  K 5.4*  < > 4.4  CL 114*  < > 110  CO2 22  < > 29  GLUCOSE 97  < > 83  BUN 38*  < > 32*  CREATININE 1.81*  < > 1.38*  CALCIUM 8.4*  < > 8.4*  AST 24  --   --   ALT 9*  --   --   ALKPHOS 101  --   --   BILITOT 0.5  --   --   < > = values in this interval not displayed.  Cardiac Enzymes  Recent Labs Lab 07/03/16 0947  TROPONINI <0.03    Microbiology Results  Results for orders placed or performed during the hospital encounter of 07/03/16  Urine culture     Status: Abnormal (Preliminary result)   Collection Time: 07/03/16  4:04 PM  Result Value Ref Range Status   Specimen Description URINE, RANDOM  Final   Special Requests NONE  Final   Culture 70,000 COLONIES/mL GRAM  NEGATIVE RODS (A)  Final   Report Status PENDING  Incomplete    RADIOLOGY:  Ct Head Wo Contrast  Result Date: 07/03/2016 CLINICAL DATA:  Code stroke. Mental status changes beginning 1.5 hours ago. EXAM: CT HEAD WITHOUT CONTRAST TECHNIQUE: Contiguous axial images were obtained from the base of the skull through the vertex without intravenous contrast. COMPARISON:  12/31/2015 FINDINGS: Brain: The brain shows generalized atrophy. There chronic small-vessel ischemic changes of the deep and subcortical white matter. No sign of acute infarction, mass lesion, hemorrhage, hydrocephalus or extra-axial collection. No hyperdense vessels. Vascular: No hyperdense vessels. Skull: Normal Sinuses/Orbits: Clear Other: None significant IMPRESSION: Atrophy and chronic small-vessel ischemic changes. No acute finding. These results were called by telephone at the time of interpretation  on 07/03/2016 at 10:10 am to Dr. Nita Sickle , who verbally acknowledged these results. Electronically Signed   By: Paulina Fusi M.D.   On: 07/03/2016 10:13   Mr Brain Wo Contrast  Result Date: 07/03/2016 CLINICAL DATA:  Tremors an worsening right-sided weakness. EXAM: MRI HEAD WITHOUT CONTRAST TECHNIQUE: Multiplanar, multiecho pulse sequences of the brain and surrounding structures were obtained without intravenous contrast. COMPARISON:  CT same day.  MRI 08/28/2015. FINDINGS: Brain: Diffusion imaging does not show any acute or subacute infarction. The brainstem is normal. No focal cerebellar insult. Cerebral hemispheres show atrophy with chronic small-vessel ischemic changes of the white matter. No large vessel territory infarction. No mass lesion, hemorrhage, hydrocephalus or extra-axial collection. Vascular: Major vessels at the base of the brain show flow. Skull and upper cervical spine: Negative Sinuses/Orbits: Clear Other: No significant finding. IMPRESSION: No acute finding. Atrophy and chronic small vessel disease. No cause of the  presenting symptoms is identified. Electronically Signed   By: Paulina Fusi M.D.   On: 07/03/2016 15:24   US Carotid Bilateral  Result Date: 07/03/2016 CLINICAL DATA:  CVA. EXAM: BILATERAL CAROTID DUPLEX ULTRASOUND TECHNIQUE: Wallace Cullens scale imaging, color Doppler and duplex ultrasound were performed of bilateral carotid and vertebral arteries in the neck. COMPARISON:  08/28/2015 FINDINGS: Criteria: Quantification of carotid stenosis is based on velocity parameters that correlate the residual internal carotid diameter with NASCET-based stenosis levels, using the diameter of the distal internal carotid lumen as the denominator for stenosis measurement. The following velocity measurements were obtained: RIGHT ICA:  110 cm/sec CCA:  86 cm/sec SYSTOLIC ICA/CCA RATIO:  1.3 DIASTOLIC ICA/CCA RATIO:  0.7 ECA:  72 cm/sec LEFT ICA:  79 cm/sec CCA:  68 cm/sec SYSTOLIC ICA/CCA RATIO:  1.2 DIASTOLIC ICA/CCA RATIO:  1.6 ECA:  55 cm/sec RIGHT CAROTID ARTERY: Small amount of echogenic plaque in the distal common carotid artery and bulb. Small amount of echogenic plaque in the proximal internal carotid artery without significant stenosis. Normal waveforms and velocities in the right internal carotid artery. RIGHT VERTEBRAL ARTERY: Antegrade flow and normal waveform in the right vertebral artery. LEFT CAROTID ARTERY: Small amount of echogenic plaque at the left carotid bulb. Small amount of echogenic plaque in the proximal internal carotid artery. Normal waveforms and velocities in left internal carotid artery. LEFT VERTEBRAL ARTERY: Antegrade flow and normal waveform in the left vertebral artery. IMPRESSION: Mild atherosclerotic disease in the carotid arteries bilaterally. Estimated degree of stenosis in the internal carotid arteries is less than 50% bilaterally. Patent vertebral arteries with antegrade flow. Electronically Signed   By: Richarda Overlie M.D.   On: 07/03/2016 12:21    EKG:   Orders placed or performed during the  hospital encounter of 07/03/16  . ED EKG  . ED EKG  . EKG 12-Lead  . EKG 12-Lead  . EKG 12-Lead  . EKG 12-Lead      Management plans discussed with the patient, family and they are in agreement.  CODE STATUS:     Code Status Orders        Start     Ordered   07/03/16 1104  Full code  Continuous     07/03/16 1104    Code Status History    Date Active Date Inactive Code Status Order ID Comments User Context   07/03/2016 11:04 AM 07/03/2016  5:26 PM Full Code [id number]  Milagros Loll, MD ED   12/31/2015  5:42 PM 01/06/2016  7:12 PM Full Code [id number]  [name],  MD Inpatient   09/13/2015 10:55 PM 09/15/2015  8:25 PM Full Code [id number]  Oralia Manis, MD Inpatient   08/28/2015  8:14 AM 08/29/2015  4:49 PM DNR [id number]  Delfino Lovett, MD Inpatient   08/27/2015 11:25 PM 08/28/2015  8:14 AM Full Code [id number]  Wyatt Haste, MD ED    Advance Directive Documentation   Flowsheet Row Most Recent Value  Type of Advance Directive  Out of facility DNR (pink MOST or yellow form)  Pre-existing out of facility DNR order (yellow form or pink MOST form)  Yellow form placed in chart (order not valid for inpatient use)  "MOST" Form in Place?  No data      TOTAL TIME TAKING CARE OF THIS PATIENT: 40 minutes.    Katharina Caper M.D on 07/05/2016 at 8:42 AM  Between 7am to 6pm - Pager - [phone]  After 6pm go to [email or web address] - password [email or web address] [facility]  [place]  [facility]  Office  [phone]  CC: Primary care physician; [name] BETH, PA-C

## 2016-07-05 NOTE — H&P (Signed)
History and Physical    Stacey Baker KCL:[id number] DOB: 01-09-1932 DOA: 07/05/2016  Referring physician: Dr. Burlene Arnt PCP: Delon Sacramento, PA-C  Specialists: none  Chief Complaint: SOB  HPI: Stacey Baker is a 82 y.o. female has a past medical history significant for CKD, dementia, and HTN recently admitted for presumed TIA with UTI. Was found to have a small pericardial effusion and D/C'd to an ALF. Presents now with acute SOB and hypoxia. CXR reveals new bilateral pleural effusions. She is now admitted. Unable to obtain hx from patient due to dementia  Review of Systems: unable to obtain due to dementia  Past Medical History:  Diagnosis Date  . CKD (chronic kidney disease), stage III    a. With acute worsening/AKI noted on several admissions.  . Dementia   . Depression   . Encephalopathy   . Hyperkalemia    a. 08/2015  . Hyperlipidemia   . Hypertension   . Parkinson disease ([place])   . Pericardial effusion    a. 06/2016 Echo: Ef 60-65%, no rwma, Gr1 DD, mild MR, mildly dil LA, PASP 17mHg, mod circumferential pericardial effusion - no hemodynamic compromise.  . Sinus bradycardia    a. 08/2015 in setting of beta blocker and hyperkalemia.  . Stroke ([place]   . TIA (transient ischemic attack)    a. 08/2015 - essentially neg w/u for stroke.   Past Surgical History:  Procedure Laterality Date  . ABDOMINAL HYSTERECTOMY    . CHOLECYSTECTOMY     Social History:  reports that she has never smoked. She has never used smokeless tobacco. She reports that she does not drink alcohol or use drugs.  Allergies  Allergen Reactions  . Penicillins Other (See Comments)    Reaction:  Unknown   . Plavix [Clopidogrel Bisulfate] Other (See Comments)    Reaction:  Unknown     Family History  Problem Relation Age of Onset  . Heart disease Mother   . Hypertension Other     Prior to Admission medications   Medication Sig Start Date End Date Taking? Authorizing Provider  amLODipine  (NORVASC) 10 MG tablet Take 1 tablet (10 mg total) by mouth daily. 09/15/15  Yes RTheodoro Grist MD  aspirin EC 81 MG tablet Take 81 mg by mouth daily.   Yes Historical Provider, MD  atorvastatin (LIPITOR) 10 MG tablet Take 10 mg by mouth at bedtime.   Yes Historical Provider, MD  carbidopa-levodopa (SINEMET IR) 25-100 MG tablet Take 1 tablet by mouth 3 (three) times daily.   Yes Historical Provider, MD  ciprofloxacin (CIPRO) 250 MG tablet Take 1 tablet (250 mg total) by mouth 2 (two) times daily. 07/05/16  Yes RTheodoro Grist MD  diclofenac sodium (VOLTAREN) 1 % GEL Apply 2 g topically 2 (two) times daily. Pt applies to left knee.   Yes Historical Provider, MD  escitalopram (LEXAPRO) 10 MG tablet Take 10 mg by mouth daily.   Yes Historical Provider, MD  ferrous sulfate 325 (65 FE) MG tablet Take 325 mg by mouth 2 (two) times daily.   Yes Historical Provider, MD  levETIRAcetam (KEPPRA) 500 MG tablet Take 500 mg by mouth 2 (two) times daily.   Yes Historical Provider, MD  mirtazapine (REMERON) 15 MG tablet Take 15 mg by mouth at bedtime.   Yes Historical Provider, MD  QUEtiapine (SEROQUEL) 25 MG tablet Take 25 mg by mouth at bedtime.   Yes Historical Provider, MD  vitamin B-12 (CYANOCOBALAMIN) 1000 MCG tablet Take 1,000 mcg by mouth  daily.   Yes Historical Provider, MD   Physical Exam: Vitals:   07/05/16 1524 07/05/16 1525 07/05/16 1542 07/05/16 1630  BP: 134/72   (!) 151/73  Pulse: (!) 58   (!) 57  Resp: 18     Temp: 98.4 F (36.9 C)     TempSrc: Oral     SpO2: 100%  100% 98%  Weight:  72.6 kg (160 lb)    Height:  _0  (1.6 m)       General:  WDWN in moderate distress, [place]/AT  Eyes: PERRL, EOMI, no scleral icterus, conjunctiva clear  ENT: moist oropharynx without exudate, Tm's benign, dentition poor  Neck: supple, no lymphadenopathy. No bruits or thyromegaly  Cardiovascular: regular rate without MRG; 2+ peripheral pulses, no JVD, trace peripheral edema  Respiratory: decreased  breath sounds at both bases with bilateral rales but no rhonchi or wheezing. Respiratory effort increased  Abdomen: soft, non tender to palpation, positive bowel sounds, no guarding, no rebound  Skin: no rashes or lesions  Musculoskeletal: normal bulk and tone, no joint swelling  Psychiatric: normal mood and affect, alert but oriented to person only  Neurologic: CN 2-12 grossly intact, Motor strength 5/5 in all 4 groups with symmetric DTR's and non-focal sensory exam  Labs on Admission:  Basic Metabolic Panel:  Recent Labs Lab 07/03/16 0947 07/04/16 0503 07/05/16 0444  NA 143 145 143  K 5.4* 4.3 4.4  CL 114* 112* 110  CO2 _1 GLUCOSE 97 76 83  BUN 38* 36* 32*  CREATININE 1.81* 1.46* 1.38*  CALCIUM 8.4* 8.2* 8.4*   Liver Function Tests:  Recent Labs Lab 07/03/16 0947  AST 24  ALT 9*  ALKPHOS 101  BILITOT 0.5  PROT 7.2  ALBUMIN 4.0   No results for input(s): LIPASE, AMYLASE in the last 168 hours. No results for input(s): AMMONIA in the last 168 hours. CBC:  Recent Labs Lab 07/03/16 0947 07/04/16 0503 07/05/16 0444 07/05/16 1651  WBC 5.0 3.5* 4.4 4.7  NEUTROABS 3.3  --   --  3.2  HGB 11.8* 11.5* 11.2* 12.4  HCT 35.8 34.1* 32.8* 36.4  MCV 87.2 87.6 87.2 86.3  PLT 122* 94* 97* 120*   Cardiac Enzymes:  Recent Labs Lab 07/03/16 0947  TROPONINI <0.03    BNP (last 3 results) No results for input(s): BNP in the last 8760 hours.  ProBNP (last 3 results) No results for input(s): PROBNP in the last 8760 hours.  CBG:  Recent Labs Lab 07/03/16 0946  GLUCAP 88    Radiological Exams on Admission: Dg Chest 2 View  Result Date: 07/05/2016 CLINICAL DATA:  Recent TIA.  Shortness of breath. EXAM: CHEST  2 VIEW COMPARISON:  01/03/2016 FINDINGS: Patient is rotated to the right. Lungs are adequately inflated demonstrate moderate bilateral pleural effusions. Likely associated atelectasis in the lung bases. Stable cardiomegaly. There is a moderate  compression fracture in the region of the upper lumbar spine age indeterminate. Remainder of the exam is unchanged. IMPRESSION: Bilateral moderate-sized pleural effusions. Likely associated bibasilar atelectasis. Moderate cardiomegaly unchanged. Moderate compression deformity over the upper lumbar spine age indeterminate. Electronically Signed   By: Marin Olp M.D.   On: 07/05/2016 16:28    EKG: Independently reviewed.  Assessment/Plan Principal Problem:   Acute respiratory failure (HCC) Active Problems:   CKD (chronic kidney disease), stage III   Pericardial effusion   Bilateral pleural effusion   Will admit to floor with telemetry and O2. Repeat echo. Check CT  chest and ESR. Consult Cardiology. Repeat labs in AM. Will continue po ABX for UTI and add SVN's.  Diet: low salt Fluids: saline lock DVT Prophylaxis: SQ Heparin  Code Status: FULL  Family Communication: none  Disposition Plan: SNF  Time spent: 50 min

## 2016-07-05 NOTE — ED Triage Notes (Addendum)
[facility] at [facility]. Pt was discharged from room 110 today and when EMS was transporting her back, her oxygen saturation was 86% on RA.  Pt had no orders for oxygen upon discharge from hospital and staff [facility] do not have a way to arrange for oxygen without an order and home health to come and set it up.  Pt is alert and oriented, but aggitated about having to return to hospital. [name] medical necessity states patient requires continuous oxygen but did not leave the hospital with oxygen.

## 2016-07-05 NOTE — ED Notes (Signed)
Pt transported to room 236A 

## 2016-07-05 NOTE — Clinical Social Work Note (Signed)
CSW contacted [facility] concerning Holter monitor return. Facility advised that they could not guarantee return of Holter monitor unless via home health. CSW attempted to contact patient's RN to update.   Argentina PonderKaren Martha Lemma Tetro, MSW, LCSW-A [phone]

## 2016-07-05 NOTE — Clinical Social Work Note (Signed)
[facility]. Patient, son, and facility aware. CSW will con't to follow for any additional dc needs.  Argentina PonderKaren Martha Suttyn Cryder, MSW, LCSW-A [phone]

## 2016-07-05 NOTE — ED Notes (Signed)
Spoke with patient's son Stacey Baker on the phone, explained to him patient's situation about being back in the hospital and that the patient would be re-admitted back to hospital for further evaluation of low oxygen saturation.

## 2016-07-05 NOTE — Progress Notes (Addendum)
New Admission Note:   Arrival Method: Bed Mental Orientation: Patient oriented to person, Disoriented to place, time and situation  Telemetry:Box [id number], verified with Lonzo CloudLisa Romero, RN.  Assessment: Completed Skin: Intact. Verified with Lonzo CloudLisa Romero, RN.  Oxygen: nasal cannula 2 L/min.  IV: Antecubital 20G Pain: 1/10 generalized pain  Safety Measures: Safety Fall Prevention Plan has been discussed  Admission:  236A-AA Orientation: Patient has been orientated to the room, unit, equipment and staff.  Family: None at bedside   Orders to be reviewed and implemented. Will continue to monitor the patient. Phone has been placed within reach and bed alarm has been activated. MRSA PCR screen collected and sent to lab.  Tynslee Bowlds, RN Phone:

## 2016-07-05 NOTE — ED Notes (Signed)
Patient transported to CT 

## 2016-07-05 NOTE — Clinical Social Work Note (Signed)
Patient's RN Erskine SquibbJane contacted CSW citing that patient returned to ED via EMS due to sat's decreased. CSW advised RN to contact RNCM in ED to determine if o2 should be ordered for the patient via Home Health at the ALF. Medical necessity form indicated o2 use due to patient being on o2 at time of dc order and no indication of o2 weaning. CSW will con't to follow to update medical necessity form as needed.  Argentina PonderKaren Martha Harnoor Reta, MSW, LCSW-A [phone]

## 2016-07-05 NOTE — Progress Notes (Signed)
Was called by Erskine SquibbJane, patient's RN to place holter monitor on patient before discharge.  When reviewing discharge information noticed patient is not going home but to a facility.  Called and spoke with Clydie BraunKaren from Child psychotherapistsocial worker and DR. Vaickute to discuss the return of holter monitor from facility.  It was relayed back to Clydie BraunKaren the Child psychotherapistsocial worker via me , that Dr. Winona LegatoVaickute stated that if she Clydie Braun(Karen) could not get confirmation that the holter monitor could be returned  to the hospital then the holter monitor should not be placed.

## 2016-07-05 NOTE — Progress Notes (Addendum)
Respiratory advised [facility] will need to be responsible for bringing holtor monitor [facility]. Karen,SW contacted facility and they are not able to verify someone will be responsible for bringing holtor back or [facility] could do this after pt at facility or outpatient cardio appt. Dr. Winona LegatoVaickute advised with order discontinued and hopes that facility will FU with cardiology for this. TC report to Clydie [facility]Karen [facility] with pt [facility]. Discharge packet ready. Will call EMS for transport back to facility.  Son here and aware pt is being discharges.

## 2016-07-06 ENCOUNTER — Inpatient Hospital Stay: Payer: Medicare Other

## 2016-07-06 DIAGNOSIS — R7981 Abnormal blood-gas level: Secondary | ICD-10-CM

## 2016-07-06 DIAGNOSIS — I319 Disease of pericardium, unspecified: Secondary | ICD-10-CM

## 2016-07-06 DIAGNOSIS — R001 Bradycardia, unspecified: Secondary | ICD-10-CM

## 2016-07-06 LAB — COMPREHENSIVE METABOLIC PANEL
ALBUMIN: 3.2 g/dL — AB (ref 3.5–5.0)
ALK PHOS: 78 U/L (ref 38–126)
ANION GAP: 3 — AB (ref 5–15)
AST: 12 U/L — AB (ref 15–41)
BILIRUBIN TOTAL: 0.3 mg/dL (ref 0.3–1.2)
BUN: 30 mg/dL — AB (ref 6–20)
CALCIUM: 8.2 mg/dL — AB (ref 8.9–10.3)
CO2: 29 mmol/L (ref 22–32)
Chloride: 112 mmol/L — ABNORMAL HIGH (ref 101–111)
Creatinine, Ser: 1.28 mg/dL — ABNORMAL HIGH (ref 0.44–1.00)
GFR calc Af Amer: 44 mL/min — ABNORMAL LOW (ref >60)
GFR calc non Af Amer: 38 mL/min — ABNORMAL LOW (ref >60)
GLUCOSE: 75 mg/dL (ref 65–99)
Potassium: 4.4 mmol/L (ref 3.5–5.1)
SODIUM: 144 mmol/L (ref 135–145)
Total Protein: 6 g/dL — ABNORMAL LOW (ref 6.5–8.1)

## 2016-07-06 LAB — CBC
HCT: 33.5 % — ABNORMAL LOW (ref 35.0–47.0)
HEMOGLOBIN: 11.1 g/dL — AB (ref 12.0–16.0)
MCH: 29.3 pg (ref 26.0–34.0)
MCHC: 33.3 g/dL (ref 32.0–36.0)
MCV: 87.9 fL (ref 80.0–100.0)
Platelets: 92 10*3/uL — ABNORMAL LOW (ref 150–440)
RBC: 3.81 MIL/uL (ref 3.80–5.20)
RDW: 16.1 % — AB (ref 11.5–14.5)
WBC: 3.7 10*3/uL (ref 3.6–11.0)

## 2016-07-06 LAB — TROPONIN I: Troponin I: <0.03 ng/mL (ref <0.03)

## 2016-07-06 MED ORDER — FUROSEMIDE 40 MG PO TABS
40.0000 mg | ORAL_TABLET | Freq: Every day | ORAL | Status: DC
  Administered 2016-07-06 – 2016-07-07 (×2): 40 mg via ORAL
  Filled 2016-07-06 (×2): qty 1

## 2016-07-06 MED ORDER — CEPHALEXIN 250 MG PO CAPS
250.0000 mg | ORAL_CAPSULE | Freq: Two times a day (BID) | ORAL | Status: AC
  Administered 2016-07-06 – 2016-07-07 (×4): 250 mg via ORAL
  Filled 2016-07-06 (×4): qty 1

## 2016-07-06 NOTE — Progress Notes (Signed)
[place] [facility] [place] NAME: Stacey Baker    MR#:  [id number]  DATE OF BIRTH:  June 15, 1932  SUBJECTIVE:  CHIEF COMPLAINT:   Chief Complaint  Patient presents with  . Shortness of Breath   The patient is a 82 year old Caucasian female with past medical history significant for history of CK D stage III, dementia, depression, hyperlipidemia, hypertension, Parkinson's disease, stroke with residual right-sided weakness, who presents to the hospital with complaints of worsening right-sided weakness and tremors. Apparently patient was found slumped onto the right side, she was brought to the hospital for further evaluation. CT scan of the head revealed no acute changes, she was admitted for possible TIA. She underwent stroke workup, including MRI of the brain, revealing no acute findings, atrophy and chronic small vessel disease, ultrasound of carotid arteries, revealing mild atherosclerotic disease in the carotid arteries bilaterally, estimated degree of stenosis in the internal carotid arteries with less than 50% bilaterally, echocardiogram showed moderate sized pericardial effusion, normal ejection fraction, no wall motion abnormalities, severely elevated pulmonary arterial pressures. The patient was also noted to be bradycardic with heart rate dipping down to 30s to 40s intermittently, however, patient's blood pressure was maintained. Cardiology consultation was obtained, recommended no therapy for pericardial effusion,to repeat echocardiogram as outpatient<  Holter monitor was recommended to be placed prior to discharge. The patient will be evaluated by physical therapist and recommendations will be made in regards to discharge. Speech therapist saw patient in consultation and recommended regular diet with thin liquids. Patient was discharged to assisted living facility only to return back with hypoxia, O2 sat of 86% on room air. Chest x-ray revealed  bilateral pleural effusions, CT scan showed pericardial effusion, right lower lobe consolidation/atelectasis, left basilar atelectasis.    Review of Systems  Constitutional: Negative for chills, fever and weight loss.  HENT: Negative for congestion.   Eyes: Negative for blurred vision and double vision.  Respiratory: Negative for cough, sputum production, shortness of breath and wheezing.   Cardiovascular: Negative for chest pain, palpitations, orthopnea, leg swelling and PND.  Gastrointestinal: Negative for abdominal pain, blood in stool, constipation, diarrhea, nausea and vomiting.  Genitourinary: Negative for dysuria, frequency, hematuria and urgency.  Musculoskeletal: Negative for falls.  Neurological: Negative for dizziness, tremors, focal weakness and headaches.  Endo/Heme/Allergies: Does not bruise/bleed easily.  Psychiatric/Behavioral: Negative for depression. The patient does not have insomnia.     VITAL SIGNS: Blood pressure (!) 117/46, pulse 62, temperature 97.6 F (36.4 C), temperature source Oral, resp. rate 18, height 5' 3"  (1.6 m), weight 69.3 kg (152 lb 11.2 oz), SpO2 90 %.  PHYSICAL EXAMINATION:   GENERAL:  82 y.o.-year-old patient lying in the bed with no acute distress.  EYES: Pupils equal, round, reactive to light and accommodation. No scleral icterus. Extraocular muscles intact.  HEENT: Head atraumatic, normocephalic. Oropharynx and nasopharynx clear.  NECK:  Supple, no jugular venous distention. No thyroid enlargement, no tenderness.  LUNGS: Diminished breath sounds bilaterally at bases, no wheezing, rales,rhonchi or crepitation. No use of accessory muscles of respiration.  CARDIOVASCULAR: S1, S2 normal. No murmurs, rubs, or gallops.  ABDOMEN: Soft, nontender, nondistended. Bowel sounds present. No organomegaly or mass.  EXTREMITIES: 1+ lower extremity and pedal edema bilaterally, no cyanosis, or clubbing.  NEUROLOGIC: Cranial nerves II through XII are intact.  Muscle strength 5/5 in all extremities. Sensation intact. Gait not checked.  PSYCHIATRIC: The patient is alert and oriented x 3.  SKIN: No obvious rash,  lesion, or ulcer.   ORDERS/RESULTS REVIEWED:   CBC  Recent Labs Lab 07/03/16 0947 07/04/16 0503 07/05/16 0444 07/05/16 1651 07/06/16 0448  WBC 5.0 3.5* 4.4 4.7 3.7  HGB 11.8* 11.5* 11.2* 12.4 11.1*  HCT 35.8 34.1* 32.8* 36.4 33.5*  PLT 122* 94* 97* 120* 92*  MCV 87.2 87.6 87.2 86.3 87.9  MCH 28.7 29.5 29.7 29.4 29.3  MCHC 32.9 33.7 34.0 34.1 33.3  RDW 16.5* 15.8* 16.1* 15.9* 16.1*  LYMPHSABS 1.1  --   --  1.1  --   MONOABS 0.4  --   --  0.3  --   EOSABS 0.2  --   --  0.1  --   BASOSABS 0.0  --   --  0.0  --    ------------------------------------------------------------------------------------------------------------------  Chemistries   Recent Labs Lab 07/03/16 0947 07/04/16 0503 07/05/16 0444 07/05/16 1651 07/06/16 0448  NA 143 145 143 144 144  K 5.4* 4.3 4.4 4.6 4.4  CL 114* 112* 110 109 112*  CO2 22 27 29 31 29   GLUCOSE 97 76 83 112* 75  BUN 38* 36* 32* 32* 30*  CREATININE 1.81* 1.46* 1.38* 1.50* 1.28*  CALCIUM 8.4* 8.2* 8.4* 8.5* 8.2*  AST 24  --   --   --  12*  ALT 9*  --   --   --  <5*  ALKPHOS 101  --   --   --  78  BILITOT 0.5  --   --   --  0.3   ------------------------------------------------------------------------------------------------------------------ estimated creatinine clearance is 31.7 mL/min (by C-G formula based on SCr of 1.28 mg/dL (H)). ------------------------------------------------------------------------------------------------------------------  Recent Labs  07/04/16 0503  TSH 2.943    Cardiac Enzymes  Recent Labs Lab 07/05/16 2302 07/06/16 0448 07/06/16 1101  TROPONINI <0.03 <0.03 <0.03   ------------------------------------------------------------------------------------------------------------------ Invalid input(s):  POCBNP ---------------------------------------------------------------------------------------------------------------  RADIOLOGY: Dg Chest 2 View  Result Date: 07/05/2016 CLINICAL DATA:  Recent TIA.  Shortness of breath. EXAM: CHEST  2 VIEW COMPARISON:  01/03/2016 FINDINGS: Patient is rotated to the right. Lungs are adequately inflated demonstrate moderate bilateral pleural effusions. Likely associated atelectasis in the lung bases. Stable cardiomegaly. There is a moderate compression fracture in the region of the upper lumbar spine age indeterminate. Remainder of the exam is unchanged. IMPRESSION: Bilateral moderate-sized pleural effusions. Likely associated bibasilar atelectasis. Moderate cardiomegaly unchanged. Moderate compression deformity over the upper lumbar spine age indeterminate. Electronically Signed   By: Marin Olp M.D.   On: 07/05/2016 16:28   Ct Chest Wo Contrast  Result Date: 07/05/2016 CLINICAL DATA:  82 year old female with acute shortness of breath and hypoxia. Pleural effusion on recent chest radiograph. EXAM: CT CHEST WITHOUT CONTRAST TECHNIQUE: Multidetector CT imaging of the chest was performed following the standard protocol without IV contrast. COMPARISON:  07/05/2016 and prior radiographs FINDINGS: Cardiovascular: Cardiomegaly noted. Thoracic aortic atherosclerotic calcifications noted without aneurysm. A moderate to large pericardial effusion is identified. Mediastinum/Nodes: A large hiatal hernia is noted. No enlarged lymph nodes identified. A right substernal goiter is present. Lungs/Pleura: Small moderate bilateral pleural effusions are noted, right greater than left. Right lower lobe consolidation/ atelectasis and left basilar atelectasis identified. There is no evidence of pneumothorax. Centrilobular emphysema is identified. Upper Abdomen: No acute abnormality. Musculoskeletal: A 50% anterior wedge compression fracture of L1 is age indeterminate. IMPRESSION: Moderate to  large pericardial effusion and small to moderate bilateral pleural effusions, right greater than left. Right lower lobe consolidation/ atelectasis and left basilar atelectasis. Age indeterminate L1 compression fracture.  Cardiomegaly. Large hiatal hernia. Substernal goiter. Electronically Signed   By: Margarette Canada M.D.   On: 07/05/2016 18:14    EKG:  Orders placed or performed during the hospital encounter of 07/05/16  . ED EKG  . ED EKG  . EKG 12-Lead  . EKG 12-Lead    ASSESSMENT AND PLAN:  Principal Problem:   Acute respiratory failure (HCC) Active Problems:   Pericardial effusion   CKD (chronic kidney disease), stage III   Bilateral pleural effusion  #1. Acute on chronic likely respiratory failure with hypoxia, continue oxygen therapy, continue diuresis with Lasix, following in's and outs and weight , discussed with cardiologist. VQ scan today to rule out pulmonary embolism, Doppler ultrasound of lower extremities to rule out DVT. Patient will require oxygen therapy at home due to pulmonary hypertension.  #2 . Pericardial effusion, seems to be stable, no signs of tamponade, follow vital signs closely with diuresis. ESR  is normal. Cardiologist does not recommend therapy.  #3. CK D stage III, follow with diuresis #4. Bilateral pleural effusions due to acute diastolic CHF, follow oxygenation with diuresis #5. Thrombocytopenia, stable #6. Bradycardia, sinus, heart rate is between 50s to 60s at present, no beta blockers or calcium channel blockers are recommended. TSH was checked in the past, normal Management plans discussed with the patient, family and they are in agreement.   DRUG ALLERGIES:  Allergies  Allergen Reactions  . Penicillins Other (See Comments)    Reaction:  Unknown   . Plavix [Clopidogrel Bisulfate] Other (See Comments)    Reaction:  Unknown     CODE STATUS:     Code Status Orders        Start     Ordered   07/05/16 1746  Full code  Continuous     07/05/16  1747    Code Status History    Date Active Date Inactive Code Status Order ID Comments User Context   07/03/2016 11:04 AM 07/03/2016  5:26 PM Full Code [id number]  Hillary Bow, MD ED   12/31/2015  5:42 PM 01/06/2016  7:12 PM Full Code [id number]  Henreitta Leber, MD Inpatient   09/13/2015 10:55 PM 09/15/2015  8:25 PM Full Code [id number]  Lance Coon, MD Inpatient   08/28/2015  8:14 AM 08/29/2015  4:49 PM DNR [id number]  Max Sane, MD Inpatient   08/27/2015 11:25 PM 08/28/2015  8:14 AM Full Code [id number]  Lytle Butte, MD ED      TOTAL TIME TAKING CARE OF THIS PATIENT: 40 minutes.   Discussed with cardiologist Jakobe Blau M.D on 07/06/2016 at 2:01 PM  Between 7am to 6pm - Pager - [phone]  After 6pm go to [email or web address] - password [email or web address] [place] [facility]  Office  [phone]  CC: Primary care physician; Arvin Collard BETH, PA-C

## 2016-07-06 NOTE — Progress Notes (Signed)
Patient slept throughout the night. No complaints of pain.  Pt. Run sinus brady ( mid 40's ). Respiratory rate around 12 breaths per minute, occasionally would drop at 9 breaths per minute. SpO2 99% on 2l/min No SOB or signs of acute distress. Will continue to monitor pt.  Jaley Yan, RN

## 2016-07-06 NOTE — Progress Notes (Signed)
Patient's top sheet, gown, blanket & pad heavily soaked with urine. Changed all the above at this time with NT assistance. Patient states does not know when or how much she urinates at any given time. Instructed to use call light if feels soiled later in shift. Will continue to monitor. Stacey FavreSteven M [facility]

## 2016-07-06 NOTE — Clinical Social Work Note (Signed)
CSW received consult. CSW following. o2 order is purview of RNCM. CSW alerted RNCM.   Argentina PonderKaren Martha Sabin Gibeault, MSW, LCSW-A [phone]

## 2016-07-06 NOTE — Consult Note (Signed)
Cardiology Consult    Patient ID: Stacey Baker MRN: [id number], DOB/AGE: 02-19-1932   Admit date: 07/05/2016 Date of Consult: 07/06/2016  Primary Physician: Merlene Pulling, PA-C Primary Cardiologist: seen by M. Kirke Corin, MD on previous admission Requesting Provider: Lenetta Quaker  Patient Profile    82 y/o ? with a h/o HTN, HL, CKD III, TIA/Stroke, parkinson's dzs, and dementia, pulmonary hypertension, pericardial effusion Cardiology reconsulted for ?low o2 sats in setting of pericardial effusion  Past Medical History   Past Medical History:  Diagnosis Date  . CKD (chronic kidney disease), stage III    a. With acute worsening/AKI noted on several admissions.  . Dementia   . Depression   . Encephalopathy   . Hyperkalemia    a. 08/2015  . Hyperlipidemia   . Hypertension   . Parkinson disease (HCC)   . Pericardial effusion    a. 06/2016 Echo: Ef 60-65%, no rwma, Gr1 DD, mild MR, mildly dil LA, PASP , mod circumferential pericardial effusion - no hemodynamic compromise.  . Sinus bradycardia    a. 08/2015 in setting of beta blocker and hyperkalemia.  . Stroke (HCC)   . TIA (transient ischemic attack)    a. 08/2015 - essentially neg w/u for stroke.    Past Surgical History:  Procedure Laterality Date  . ABDOMINAL HYSTERECTOMY    . CHOLECYSTECTOMY       Allergies  Allergies  Allergen Reactions  . Penicillins Other (See Comments)    Reaction:  Unknown   . Plavix [Clopidogrel Bisulfate] Other (See Comments)    Reaction:  Unknown     History of Present Illness    82 y/o ? without a prior cardiac history.  She does have several risk factors including HTN, HL, TIA/CVA, and CKD III, in addition to a prior h/o dementia, parkinson's, and intermittent hyperkalemia.    Patient was recently admitted 9/14 with tremors and worsening right sided wkns and has been found to have a moderate pericardial effusion and bradycardia. Plan from cardiac standpoint was outpatient  holter monitor and echo to ffup pericardial effusion.  She was discharged to [facility] 07/05/2016 and sent back to ER when O2 levels were noted to be low in the 80s. It was documented to be 87% in the ER. She is not previously known to need oxygen. She is a poor historian because of her dementia. She does not volunteer any symptoms of shortness of breath, chest pain, lightheadedness, dizziness, orthopnea, PND, edema.  Currently, her O2 sat is 95% on room air. She was given a dose of Lasix 40 mg by mouth this morning. Her I/Os are about even.  Inpatient Medications    . amLODipine  10 mg Oral Daily  . aspirin EC  81 mg Oral Daily  . atorvastatin  10 mg Oral QHS  . carbidopa-levodopa  1 tablet Oral TID  . cephALEXin  250 mg Oral BID  . diclofenac sodium  2 g Topical BID  . docusate sodium  100 mg Oral BID  . escitalopram  10 mg Oral Daily  . ferrous sulfate  325 mg Oral BID  . furosemide  40 mg Oral Daily  . heparin  5,000 Units Subcutaneous Q8H  . ipratropium-albuterol  3 mL Nebulization QID  . levETIRAcetam  500 mg Oral BID  . mirtazapine  15 mg Oral QHS  . QUEtiapine  25 mg Oral QHS  . sodium chloride flush  3 mL Intravenous Q12H  . vitamin B-12  1,000 mcg Oral Daily  Family History    Family History  Problem Relation Age of Onset  . Heart disease Mother   . Hypertension Other     Social History    Social History   Social History  . Marital status: Widowed    Spouse name: N/A  . Number of children: N/A  . Years of education: N/A   Occupational History  . Not on file.   Social History Main Topics  . Smoking status: Never Smoker  . Smokeless tobacco: Never Used  . Alcohol use No  . Drug use: No  . Sexual activity: Not on file   Other Topics Concern  . Not on file   Social History Narrative  . No narrative on file     Review of Systems    General:  No chills, fever, night sweats or weight changes.  Cardiovascular:  No chest pain, dyspnea on exertion with  higher levels of activity, no orthopnea, palpitations, paroxysmal nocturnal dyspnea. Dermatological: No rash, lesions/masses Respiratory: No cough, dyspnea Urologic: No hematuria, dysuria Abdominal:   No nausea, vomiting, diarrhea, bright red blood per rectum, melena, or hematemesis Neurologic:  No visual changes, wkns, changes in mental status. All other systems reviewed and are otherwise negative except as noted above.  Physical Exam    Blood pressure (!) 153/74, pulse 65, temperature 97.6 F (36.4 C), temperature source Oral, resp. rate 18, height 5\' 3"  (1.6 m), weight 152 lb 11.2 oz (69.3 kg), SpO2 95 %.  General: Pleasant, NAD Psych: Normal affect. Neuro: Disoriented to time and place. Cooperative.  Moves all extremities spontaneously. HEENT: Normal  Neck: Supple without bruits or JVD. Lungs:  Resp regular and unlabored, CTA. Heart: RRR no s3, s4, or murmurs.  No rubs. Abdomen: Soft, non-tender, non-distended, BS + x 4.  Extremities: No clubbing, cyanosis.  No edema. DP/PT/Radials 2+ and equal bilaterally.  Labs     Recent Labs  07/05/16 1651 07/05/16 2302 07/06/16 0448  TROPONINI <0.03 <0.03 <0.03   Lab Results  Component Value Date   WBC 3.7 07/06/2016   HGB 11.1 (L) 07/06/2016   HCT 33.5 (L) 07/06/2016   MCV 87.9 07/06/2016   PLT 92 (L) 07/06/2016     Recent Labs Lab 07/06/16 0448  NA 144  K 4.4  CL 112*  CO2 29  BUN 30*  CREATININE 1.28*  CALCIUM 8.2*  PROT 6.0*  BILITOT 0.3  ALKPHOS 78  ALT <5*  AST 12*  GLUCOSE 75   Lab Results  Component Value Date   CHOL 130 08/28/2015   HDL 61 08/28/2015   LDLCALC 62 08/28/2015   TRIG 37 08/28/2015     Radiology Studies    Dg Chest 2 View  Result Date: 07/05/2016 CLINICAL DATA:  Recent TIA.  Shortness of breath. EXAM: CHEST  2 VIEW COMPARISON:  01/03/2016 FINDINGS: Patient is rotated to the right. Lungs are adequately inflated demonstrate moderate bilateral pleural effusions. Likely associated  atelectasis in the lung bases. Stable cardiomegaly. There is a moderate compression fracture in the region of the upper lumbar spine age indeterminate. Remainder of the exam is unchanged. IMPRESSION: Bilateral moderate-sized pleural effusions. Likely associated bibasilar atelectasis. Moderate cardiomegaly unchanged. Moderate compression deformity over the upper lumbar spine age indeterminate. Electronically Signed   By: Elberta Fortis M.D.   On: 07/05/2016 16:28   Ct Head Wo Contrast  Result Date: 07/03/2016 CLINICAL DATA:  Code stroke. Mental status changes beginning 1.5 hours ago. EXAM: CT HEAD WITHOUT CONTRAST TECHNIQUE: Contiguous axial  images were obtained from the base of the skull through the vertex without intravenous contrast. COMPARISON:  12/31/2015 FINDINGS: Brain: The brain shows generalized atrophy. There chronic small-vessel ischemic changes of the deep and subcortical white matter. No sign of acute infarction, mass lesion, hemorrhage, hydrocephalus or extra-axial collection. No hyperdense vessels. Vascular: No hyperdense vessels. Skull: Normal Sinuses/Orbits: Clear Other: None significant IMPRESSION: Atrophy and chronic small-vessel ischemic changes. No acute finding. These results were called by telephone at the time of interpretation on 07/03/2016 at 10:10 am to Dr. Nita Sickle , who verbally acknowledged these results. Electronically Signed   By: Paulina Fusi M.D.   On: 07/03/2016 10:13   Ct Chest Wo Contrast  Result Date: 07/05/2016 CLINICAL DATA:  82 year old female with acute shortness of breath and hypoxia. Pleural effusion on recent chest radiograph. EXAM: CT CHEST WITHOUT CONTRAST TECHNIQUE: Multidetector CT imaging of the chest was performed following the standard protocol without IV contrast. COMPARISON:  07/05/2016 and prior radiographs FINDINGS: Cardiovascular: Cardiomegaly noted. Thoracic aortic atherosclerotic calcifications noted without aneurysm. A moderate to large  pericardial effusion is identified. Mediastinum/Nodes: A large hiatal hernia is noted. No enlarged lymph nodes identified. A right substernal goiter is present. Lungs/Pleura: Small moderate bilateral pleural effusions are noted, right greater than left. Right lower lobe consolidation/ atelectasis and left basilar atelectasis identified. There is no evidence of pneumothorax. Centrilobular emphysema is identified. Upper Abdomen: No acute abnormality. Musculoskeletal: A 50% anterior wedge compression fracture of L1 is age indeterminate. IMPRESSION: Moderate to large pericardial effusion and small to moderate bilateral pleural effusions, right greater than left. Right lower lobe consolidation/ atelectasis and left basilar atelectasis. Age indeterminate L1 compression fracture. Cardiomegaly. Large hiatal hernia. Substernal goiter. Electronically Signed   By: Harmon Pier M.D.   On: 07/05/2016 18:14   Mr Brain Wo Contrast  Result Date: 07/03/2016 CLINICAL DATA:  Tremors an worsening right-sided weakness. EXAM: MRI HEAD WITHOUT CONTRAST TECHNIQUE: Multiplanar, multiecho pulse sequences of the brain and surrounding structures were obtained without intravenous contrast. COMPARISON:  CT same day.  MRI 08/28/2015. FINDINGS: Brain: Diffusion imaging does not show any acute or subacute infarction. The brainstem is normal. No focal cerebellar insult. Cerebral hemispheres show atrophy with chronic small-vessel ischemic changes of the white matter. No large vessel territory infarction. No mass lesion, hemorrhage, hydrocephalus or extra-axial collection. Vascular: Major vessels at the base of the brain show flow. Skull and upper cervical spine: Negative Sinuses/Orbits: Clear Other: No significant finding. IMPRESSION: No acute finding. Atrophy and chronic small vessel disease. No cause of the presenting symptoms is identified. Electronically Signed   By: Paulina Fusi M.D.   On: 07/03/2016 15:24   US Carotid Bilateral  Result  Date: 07/03/2016 CLINICAL DATA:  CVA. EXAM: BILATERAL CAROTID DUPLEX ULTRASOUND TECHNIQUE: Wallace Cullens scale imaging, color Doppler and duplex ultrasound were performed of bilateral carotid and vertebral arteries in the neck. COMPARISON:  08/28/2015 FINDINGS: Criteria: Quantification of carotid stenosis is based on velocity parameters that correlate the residual internal carotid diameter with NASCET-based stenosis levels, using the diameter of the distal internal carotid lumen as the denominator for stenosis measurement. The following velocity measurements were obtained: RIGHT ICA:  110 cm/sec CCA:  86 cm/sec SYSTOLIC ICA/CCA RATIO:  1.3 DIASTOLIC ICA/CCA RATIO:  0.7 ECA:  72 cm/sec LEFT ICA:  79 cm/sec CCA:  68 cm/sec SYSTOLIC ICA/CCA RATIO:  1.2 DIASTOLIC ICA/CCA RATIO:  1.6 ECA:  55 cm/sec RIGHT CAROTID ARTERY: Small amount of echogenic plaque in the distal common carotid artery and  bulb. Small amount of echogenic plaque in the proximal internal carotid artery without significant stenosis. Normal waveforms and velocities in the right internal carotid artery. RIGHT VERTEBRAL ARTERY: Antegrade flow and normal waveform in the right vertebral artery. LEFT CAROTID ARTERY: Small amount of echogenic plaque at the left carotid bulb. Small amount of echogenic plaque in the proximal internal carotid artery. Normal waveforms and velocities in left internal carotid artery. LEFT VERTEBRAL ARTERY: Antegrade flow and normal waveform in the left vertebral artery. IMPRESSION: Mild atherosclerotic disease in the carotid arteries bilaterally. Estimated degree of stenosis in the internal carotid arteries is less than 50% bilaterally. Patent vertebral arteries with antegrade flow. Electronically Signed   By: Richarda OverlieAdam  Henn M.D.   On: 07/03/2016 12:21    ECG & Cardiac Imaging    EKG from 07/05/2016 1702 was personally reviewed by me and showed SR 52. STeelvation is nonischemic- j point elevation.   Echo 07/03/2016: Left ventricle: The  cavity size was normal. Systolic function was   normal. The estimated ejection fraction was in the range of 60%   to 65%. Wall motion was normal; there were no regional wall   motion abnormalities. Doppler parameters are consistent with   abnormal left ventricular relaxation (grade 1 diastolic   dysfunction). - Mitral valve: There was mild regurgitation. - Left atrium: The atrium was mildly dilated. - Right ventricle: Systolic function was normal. - Pulmonary arteries: Systolic pressure was severely elevated. PA   peak pressure: 72 mm Hg (S). - Pericardium, extracardiac: A moderate sized circumferential   pericardial effusion was identified. No hemodynamic compromise   noted.  Assessment & Plan    Report of Hypoxia, O2 sat of 87%, improved on oxygen Patient not clearly symptomatic No ABG done Evidence of pulmonary hypertension on echo There is atelectasis/centrilobular emphysema, pleural effusion on CT Brought up possible need to evaluate for PE with VQscan Gentle diuresis as needed -- pt has low albumin levels, hx of CKD  Moderate Pericardial Effusion Incidentally noted on echo on 9/14.  Normal LVEF.  No evidence of hemodynamic compromise.  No h/o chest pain.  Plan for f/u limited echo as an outpt.   Sinus Bradycardia No evidence of clear symptoms or hemodynamic compromise associated with bradycardia.   No evidence of high grade heart block  She has a prior documented h/o bradycardia with admission in 08/2015 for brady in the setting of hyperkalemia and  blocker therapy.  Continue to avoid betablocker or rate control meds Outpt holter She is on aricept, which may contribute to bradycardia.  As she is asymptomatic and w/o high grade heart block, assuming that aricept is helping with her dementia, benefits likely outweigh risk  AKI/CKD III  Mgmt per primary team   I spent at least 70 minutes with the patient today and more than 50% of the time was spent counseling the patient  and coordinating care.   Signed, Almond LintAileen Diane Mochizuki, MD, [facility] Army Community HospitalFACC 07/06/2016, 10:28 AM

## 2016-07-07 LAB — BASIC METABOLIC PANEL
ANION GAP: 5 (ref 5–15)
BUN: 35 mg/dL — AB (ref 6–20)
CHLORIDE: 105 mmol/L (ref 101–111)
CO2: 31 mmol/L (ref 22–32)
Calcium: 8.7 mg/dL — ABNORMAL LOW (ref 8.9–10.3)
Creatinine, Ser: 1.66 mg/dL — ABNORMAL HIGH (ref 0.44–1.00)
GFR calc Af Amer: 32 mL/min — ABNORMAL LOW (ref >60)
GFR calc non Af Amer: 28 mL/min — ABNORMAL LOW (ref >60)
GLUCOSE: 85 mg/dL (ref 65–99)
POTASSIUM: 4.5 mmol/L (ref 3.5–5.1)
Sodium: 141 mmol/L (ref 135–145)

## 2016-07-07 LAB — PLATELET COUNT: Platelets: 97 10*3/uL — ABNORMAL LOW (ref 150–440)

## 2016-07-07 MED ORDER — BISACODYL 10 MG RE SUPP
10.0000 mg | Freq: Once | RECTAL | Status: AC
Start: 2016-07-07 — End: 2016-07-07
  Administered 2016-07-07: 10 mg via RECTAL
  Filled 2016-07-07: qty 1

## 2016-07-07 MED ORDER — FUROSEMIDE 10 MG/ML IJ SOLN: 20.0000 mg | Freq: Every day | INTRAMUSCULAR | Status: DC

## 2016-07-07 MED ORDER — IPRATROPIUM-ALBUTEROL 0.5-2.5 (3) MG/3ML IN SOLN: 3.0000 mL | RESPIRATORY_TRACT | Status: DC | PRN

## 2016-07-07 NOTE — Progress Notes (Signed)
[facility] [place] NAME: Stacey Baker    MR#:  [id number]  DATE OF BIRTH:  Mar 11, 1932  SUBJECTIVE:  CHIEF COMPLAINT:   Chief Complaint  Patient presents with  . Shortness of Breath   The patient is a 82 year old Caucasian female with past medical history significant for history of CK D stage III, dementia, depression, hyperlipidemia, hypertension, Parkinson's disease, stroke with residual right-sided weakness, who presents to the hospital with complaints of worsening right-sided weakness and tremors. Apparently patient was found slumped onto the right side, she was brought to the hospital for further evaluation. CT scan of the head revealed no acute changes, she was admitted for possible TIA. She underwent stroke workup, including MRI of the brain, revealing no acute findings, atrophy and chronic small vessel disease, ultrasound of carotid arteries, revealing mild atherosclerotic disease in the carotid arteries bilaterally, estimated degree of stenosis in the internal carotid arteries with less than 50% bilaterally, echocardiogram showed moderate sized pericardial effusion, normal ejection fraction, no wall motion abnormalities, severely elevated pulmonary arterial pressures. The patient was also noted to be bradycardic with heart rate dipping down to 30s to 40s intermittently, however, patient's blood pressure was maintained. Cardiology consultation was obtained, recommended no therapy for pericardial effusion,to repeat echocardiogram as outpatient<  Holter monitor was recommended to be placed prior to discharge. The patient will be evaluated by physical therapist and recommendations will be made in regards to discharge. Speech therapist saw patient in consultation and recommended regular diet with thin liquids. Patient was discharged to assisted living facility only to return back with hypoxia, O2 sat of 86% on room air. Chest x-ray revealed  bilateral pleural effusions, CT scan showed pericardial effusion, right lower lobe consolidation/atelectasis, left basilar atelectasis.  Had multiple urinations with Iv lasix and oxygen requirement decreased today.   Review of Systems  Constitutional: Negative for chills, fever and weight loss.  HENT: Negative for congestion.   Eyes: Negative for blurred vision and double vision.  Respiratory: Negative for cough, sputum production, shortness of breath and wheezing.   Cardiovascular: Negative for chest pain, palpitations, orthopnea, leg swelling and PND.  Gastrointestinal: Negative for abdominal pain, blood in stool, constipation, diarrhea, nausea and vomiting.  Genitourinary: Negative for dysuria, frequency, hematuria and urgency.  Musculoskeletal: Negative for falls.  Neurological: Negative for dizziness, tremors, focal weakness and headaches.  Endo/Heme/Allergies: Does not bruise/bleed easily.  Psychiatric/Behavioral: Negative for depression. The patient does not have insomnia.     VITAL SIGNS: Blood pressure 136/62, pulse 60, temperature 97.4 F (36.3 C), temperature source Oral, resp. rate 16, height 5' 3"  (1.6 m), weight 69 kg (152 lb 3.2 oz), SpO2 96 %.  PHYSICAL EXAMINATION:   GENERAL:  82 y.o.-year-old patient lying in the bed with no acute distress.  EYES: Pupils equal, round, reactive to light and accommodation. No scleral icterus. Extraocular muscles intact.  HEENT: Head atraumatic, normocephalic. Oropharynx and nasopharynx clear.  NECK:  Supple, no jugular venous distention. No thyroid enlargement, no tenderness.  LUNGS: Diminished breath sounds bilaterally at bases, no wheezing, rales,rhonchi or crepitation. No use of accessory muscles of respiration.  CARDIOVASCULAR: S1, S2 normal. No murmurs, rubs, or gallops.  ABDOMEN: Soft, nontender, nondistended. Bowel sounds present. No organomegaly or mass.  EXTREMITIES: 1+ lower extremity and pedal edema bilaterally, no cyanosis,  or clubbing.  NEUROLOGIC: Cranial nerves II through XII are intact. Muscle strength 5/5 in all extremities. Sensation intact. Gait not checked.  PSYCHIATRIC: The patient is  alert and oriented x 3.  SKIN: No obvious rash, lesion, or ulcer.   ORDERS/RESULTS REVIEWED:   CBC  Recent Labs Lab 07/03/16 0947 07/04/16 0503 07/05/16 0444 07/05/16 1651 07/06/16 0448 07/07/16 0420  WBC 5.0 3.5* 4.4 4.7 3.7  --   HGB 11.8* 11.5* 11.2* 12.4 11.1*  --   HCT 35.8 34.1* 32.8* 36.4 33.5*  --   PLT 122* 94* 97* 120* 92* 97*  MCV 87.2 87.6 87.2 86.3 87.9  --   MCH 28.7 29.5 29.7 29.4 29.3  --   MCHC 32.9 33.7 34.0 34.1 33.3  --   RDW 16.5* 15.8* 16.1* 15.9* 16.1*  --   LYMPHSABS 1.1  --   --  1.1  --   --   MONOABS 0.4  --   --  0.3  --   --   EOSABS 0.2  --   --  0.1  --   --   BASOSABS 0.0  --   --  0.0  --   --    ------------------------------------------------------------------------------------------------------------------  Chemistries   Recent Labs Lab 07/03/16 0947 07/04/16 0503 07/05/16 0444 07/05/16 1651 07/06/16 0448 07/07/16 0420  NA 143 145 143 144 144 141  K 5.4* 4.3 4.4 4.6 4.4 4.5  CL 114* 112* 110 109 112* 105  CO2 22 27 29 31 29 31   GLUCOSE 97 76 83 112* 75 85  BUN 38* 36* 32* 32* 30* 35*  CREATININE 1.81* 1.46* 1.38* 1.50* 1.28* 1.66*  CALCIUM 8.4* 8.2* 8.4* 8.5* 8.2* 8.7*  AST 24  --   --   --  12*  --   ALT 9*  --   --   --  <5*  --   ALKPHOS 101  --   --   --  78  --   BILITOT 0.5  --   --   --  0.3  --    ------------------------------------------------------------------------------------------------------------------ estimated creatinine clearance is 24.3 mL/min (by C-G formula based on SCr of 1.66 mg/dL (H)). ------------------------------------------------------------------------------------------------------------------ No results for input(s): TSH, T4TOTAL, T3FREE, THYROIDAB in the last 72 hours.  Invalid input(s): FREET3  Cardiac  Enzymes  Recent Labs Lab 07/05/16 2302 07/06/16 0448 07/06/16 1101  TROPONINI <0.03 <0.03 <0.03   ------------------------------------------------------------------------------------------------------------------ Invalid input(s): POCBNP ---------------------------------------------------------------------------------------------------------------  RADIOLOGY: Dg Chest 2 View  Result Date: 07/05/2016 CLINICAL DATA:  Recent TIA.  Shortness of breath. EXAM: CHEST  2 VIEW COMPARISON:  01/03/2016 FINDINGS: Patient is rotated to the right. Lungs are adequately inflated demonstrate moderate bilateral pleural effusions. Likely associated atelectasis in the lung bases. Stable cardiomegaly. There is a moderate compression fracture in the region of the upper lumbar spine age indeterminate. Remainder of the exam is unchanged. IMPRESSION: Bilateral moderate-sized pleural effusions. Likely associated bibasilar atelectasis. Moderate cardiomegaly unchanged. Moderate compression deformity over the upper lumbar spine age indeterminate. Electronically Signed   By: Marin Olp M.D.   On: 07/05/2016 16:28   Ct Chest Wo Contrast  Result Date: 07/05/2016 CLINICAL DATA:  82 year old female with acute shortness of breath and hypoxia. Pleural effusion on recent chest radiograph. EXAM: CT CHEST WITHOUT CONTRAST TECHNIQUE: Multidetector CT imaging of the chest was performed following the standard protocol without IV contrast. COMPARISON:  07/05/2016 and prior radiographs FINDINGS: Cardiovascular: Cardiomegaly noted. Thoracic aortic atherosclerotic calcifications noted without aneurysm. A moderate to large pericardial effusion is identified. Mediastinum/Nodes: A large hiatal hernia is noted. No enlarged lymph nodes identified. A right substernal goiter is present. Lungs/Pleura: Small moderate bilateral pleural effusions are  noted, right greater than left. Right lower lobe consolidation/ atelectasis and left basilar  atelectasis identified. There is no evidence of pneumothorax. Centrilobular emphysema is identified. Upper Abdomen: No acute abnormality. Musculoskeletal: A 50% anterior wedge compression fracture of L1 is age indeterminate. IMPRESSION: Moderate to large pericardial effusion and small to moderate bilateral pleural effusions, right greater than left. Right lower lobe consolidation/ atelectasis and left basilar atelectasis. Age indeterminate L1 compression fracture. Cardiomegaly. Large hiatal hernia. Substernal goiter. Electronically Signed   By: Margarette Canada M.D.   On: 07/05/2016 18:14   US Venous Img Lower Bilateral  Result Date: 07/06/2016 CLINICAL DATA:  Patient with altered mental status. Lower extremity swelling. Evaluate for DVT. EXAM: BILATERAL LOWER EXTREMITY VENOUS DOPPLER ULTRASOUND TECHNIQUE: Gray-scale sonography with graded compression, as well as color Doppler and duplex ultrasound were performed to evaluate the lower extremity deep venous systems from the level of the common femoral vein and including the common femoral, femoral, profunda femoral, popliteal and calf veins including the posterior tibial, peroneal and gastrocnemius veins when visible. The superficial great saphenous vein was also interrogated. Spectral Doppler was utilized to evaluate flow at rest and with distal augmentation maneuvers in the common femoral, femoral and popliteal veins. COMPARISON:  None. FINDINGS: RIGHT LOWER EXTREMITY Common Femoral Vein: No evidence of thrombus. Normal compressibility, respiratory phasicity and response to augmentation. Saphenofemoral Junction: No evidence of thrombus. Normal compressibility and flow on color Doppler imaging. Profunda Femoral Vein: No evidence of thrombus. Normal compressibility and flow on color Doppler imaging. Femoral Vein: No evidence of thrombus. Normal compressibility, respiratory phasicity and response to augmentation. Popliteal Vein: No evidence of thrombus. Normal  compressibility, respiratory phasicity and response to augmentation. Calf Veins: No evidence of thrombus. Normal compressibility and flow on color Doppler imaging. Superficial Great Saphenous Vein: No evidence of thrombus. Normal compressibility and flow on color Doppler imaging. Venous Reflux:  None. Other Findings:  None. LEFT LOWER EXTREMITY Common Femoral Vein: No evidence of thrombus. Normal compressibility, respiratory phasicity and response to augmentation. Saphenofemoral Junction: No evidence of thrombus. Normal compressibility and flow on color Doppler imaging. Profunda Femoral Vein: No evidence of thrombus. Normal compressibility and flow on color Doppler imaging. Femoral Vein: No evidence of thrombus. Normal compressibility, respiratory phasicity and response to augmentation. Popliteal Vein: No evidence of thrombus. Normal compressibility, respiratory phasicity and response to augmentation. Calf Veins: No evidence of thrombus. Normal compressibility and flow on color Doppler imaging. Superficial Great Saphenous Vein: No evidence of thrombus. Normal compressibility and flow on color Doppler imaging. Venous Reflux:  None. Other Findings:  None. IMPRESSION: No evidence of deep venous thrombosis. Electronically Signed   By: Lovey Newcomer M.D.   On: 07/06/2016 15:55    EKG:  Orders placed or performed during the hospital encounter of 07/05/16  . ED EKG  . ED EKG  . EKG 12-Lead  . EKG 12-Lead    ASSESSMENT AND PLAN:  Principal Problem:   Acute respiratory failure (HCC) Active Problems:   CKD (chronic kidney disease), stage III   Pericardial effusion   Bilateral pleural effusion  #1. Acute on chronic likely respiratory failure with hypoxia, continue oxygen therapy, continue diuresis with Lasix, following in's and outs and weight , discussed with cardiologist. VQ scan not needed as pt have lung changes to explain hypoxia, Negative Doppler ultrasound of lower extremities to rule out DVT. Patient  will require oxygen therapy at home due to pulmonary hypertension.  #2 . Pericardial effusion, seems to be stable, no signs of  tamponade, follow vital signs closely with diuresis. ESR  is normal. Cardiologist does not recommend therapy.  #3. CK D stage III, follow with diuresis #4. Bilateral pleural effusions due to acute diastolic CHF, follow oxygenation with diuresis, decreased oxygen requirement now. #5. Thrombocytopenia, stable #6. Bradycardia, sinus, heart rate is between 50s to 60s at present, no beta blockers or calcium channel blockers are recommended. TSH was checked in the past, normal Management plans discussed with the patient, family and they are in agreement.   DRUG ALLERGIES:  Allergies  Allergen Reactions  . Penicillins Other (See Comments)    Reaction:  Unknown   . Plavix [Clopidogrel Bisulfate] Other (See Comments)    Reaction:  Unknown     CODE STATUS:     Code Status Orders        Start     Ordered   07/05/16 1746  Full code  Continuous     07/05/16 1747    Code Status History    Date Active Date Inactive Code Status Order ID Comments User Context   07/03/2016 11:04 AM 07/03/2016  5:26 PM Full Code [id number]  Hillary Bow, MD ED   12/31/2015  5:42 PM 01/06/2016  7:12 PM Full Code [id number]  Henreitta Leber, MD Inpatient   09/13/2015 10:55 PM 09/15/2015  8:25 PM Full Code [id number]  Lance Coon, MD Inpatient   08/28/2015  8:14 AM 08/29/2015  4:49 PM DNR [id number]  Max Sane, MD Inpatient   08/27/2015 11:25 PM 08/28/2015  8:14 AM Full Code [id number]  Lytle Butte, MD ED      TOTAL TIME TAKING CARE OF THIS PATIENT: 40 minutes.   Discussed with cardiologist Vaughan Basta M.D on 07/07/2016 at 2:46 PM  Between 7am to 6pm - Pager - [phone]  After 6pm go to [email or web address] - password [email or web address] [facility]  Office  [phone]  CC: Primary care physician; Arvin Collard BETH, PA-C

## 2016-07-07 NOTE — Progress Notes (Signed)
During ADL care patients HR increased up to 200 bpm and quickly returned to 60bpm. No signs or c/o SOB, pain, or acute distress noted. Will continue to monitor.   Savyon Loken, RN

## 2016-07-07 NOTE — Progress Notes (Signed)
Patient: Stacey Baker / Admit Date: 07/05/2016 / Date of Encounter: 07/07/2016, 9:28 AM   Subjective: Breathing better. SOB improving on O2. Up eating breakfast this AM.   Review of Systems: Review of Systems  Constitutional: Positive for malaise/fatigue. Negative for chills, diaphoresis, fever and weight loss.  HENT: Negative for congestion.   Eyes: Negative for discharge and redness.  Respiratory: Positive for shortness of breath. Negative for cough, hemoptysis, sputum production and wheezing.   Cardiovascular: Negative for chest pain, palpitations, orthopnea, claudication, leg swelling and PND.  Gastrointestinal: Negative for abdominal pain, heartburn, nausea and vomiting.  Musculoskeletal: Negative for falls and myalgias.  Skin: Negative for rash.  Neurological: Positive for weakness. Negative for dizziness, tingling, tremors, sensory change, speech change, focal weakness and loss of consciousness.  Endo/Heme/Allergies: Does not bruise/bleed easily.  Psychiatric/Behavioral: Negative for substance abuse. The patient is not nervous/anxious.   All other systems reviewed and are negative.   Objective: Telemetry: NSR, 60's bom. NO EVIDENCE OF HEART RATE IN THE 200 BPM RANGE. Telemetry shows artifact with those readings. One episode of atrial tach in the 120 range overnight.  Physical Exam: Blood pressure (!) 157/57, pulse (!) 56, temperature 97.9 F (36.6 C), temperature source Oral, resp. rate 17, height 5\' 3"  (1.6 m), weight 152 lb 3.2 oz (69 kg), SpO2 97 %. Body mass index is 26.96 kg/m. General: Frail appearing, in no acute distress. Head: Normocephalic, atraumatic, sclera non-icteric, no xanthomas, nares are without discharge. Neck: Negative for carotid bruits. JVP not elevated. Lungs: Clear bilaterally to auscultation without wheezes, rales, or rhonchi. Breathing is unlabored. Heart: RRR S1 S2 without murmurs, rubs, or gallops.  Abdomen: Soft, non-tender, non-distended with  normoactive bowel sounds. No rebound/guarding. Extremities: No clubbing or cyanosis. No edema. Distal pedal pulses are 2+ and equal bilaterally. Neuro: Alert and oriented X 3. Moves all extremities spontaneously. Psych:  Responds to questions appropriately with a normal affect.   Intake/Output Summary (Last 24 hours) at 07/07/16 0928 Last data filed at 07/06/16 2238  Gross per 24 hour  Intake              290 ml  Output                0 ml  Net              290 ml    Inpatient Medications:  . amLODipine  10 mg Oral Daily  . aspirin EC  81 mg Oral Daily  . atorvastatin  10 mg Oral QHS  . carbidopa-levodopa  1 tablet Oral TID  . cephALEXin  250 mg Oral BID  . diclofenac sodium  2 g Topical BID  . docusate sodium  100 mg Oral BID  . escitalopram  10 mg Oral Daily  . ferrous sulfate  325 mg Oral BID  . furosemide  40 mg Oral Daily  . heparin  5,000 Units Subcutaneous Q8H  . levETIRAcetam  500 mg Oral BID  . mirtazapine  15 mg Oral QHS  . QUEtiapine  25 mg Oral QHS  . sodium chloride flush  3 mL Intravenous Q12H  . vitamin B-12  1,000 mcg Oral Daily   Infusions:    Labs:  Recent Labs  07/06/16 0448 07/07/16 0420  NA 144 141  K 4.4 4.5  CL 112* 105  CO2 29 31  GLUCOSE 75 85  BUN 30* 35*  CREATININE 1.28* 1.66*  CALCIUM 8.2* 8.7*    Recent Labs  07/06/16 0448  AST 12*  ALT <5*  ALKPHOS 78  BILITOT 0.3  PROT 6.0*  ALBUMIN 3.2*    Recent Labs  07/05/16 1651 07/06/16 0448 07/07/16 0420  WBC 4.7 3.7  --   NEUTROABS 3.2  --   --   HGB 12.4 11.1*  --   HCT 36.4 33.5*  --   MCV 86.3 87.9  --   PLT 120* 92* 97*    Recent Labs  07/05/16 1651 07/05/16 2302 07/06/16 0448 07/06/16 1101  TROPONINI <0.03 <0.03 <0.03 <0.03   Invalid input(s): POCBNP No results for input(s): HGBA1C in the last 72 hours.   Weights: Filed Weights   07/05/16 1944 07/06/16 0521 07/07/16 0444  Weight: 152 lb 9.6 oz (69.2 kg) 152 lb 11.2 oz (69.3 kg) 152 lb 3.2 oz (69 kg)      Radiology/Studies:  Dg Chest 2 View  Result Date: 07/05/2016 CLINICAL DATA:  Recent TIA.  Shortness of breath. EXAM: CHEST  2 VIEW COMPARISON:  01/03/2016 FINDINGS: Patient is rotated to the right. Lungs are adequately inflated demonstrate moderate bilateral pleural effusions. Likely associated atelectasis in the lung bases. Stable cardiomegaly. There is a moderate compression fracture in the region of the upper lumbar spine age indeterminate. Remainder of the exam is unchanged. IMPRESSION: Bilateral moderate-sized pleural effusions. Likely associated bibasilar atelectasis. Moderate cardiomegaly unchanged. Moderate compression deformity over the upper lumbar spine age indeterminate. Electronically Signed   By: Elberta Fortis M.D.   On: 07/05/2016 16:28   Ct Head Wo Contrast  Result Date: 07/03/2016 CLINICAL DATA:  Code stroke. Mental status changes beginning 1.5 hours ago. EXAM: CT HEAD WITHOUT CONTRAST TECHNIQUE: Contiguous axial images were obtained from the base of the skull through the vertex without intravenous contrast. COMPARISON:  12/31/2015 FINDINGS: Brain: The brain shows generalized atrophy. There chronic small-vessel ischemic changes of the deep and subcortical white matter. No sign of acute infarction, mass lesion, hemorrhage, hydrocephalus or extra-axial collection. No hyperdense vessels. Vascular: No hyperdense vessels. Skull: Normal Sinuses/Orbits: Clear Other: None significant IMPRESSION: Atrophy and chronic small-vessel ischemic changes. No acute finding. These results were called by telephone at the time of interpretation on 07/03/2016 at 10:10 am to Dr. Nita Sickle , who verbally acknowledged these results. Electronically Signed   By: Paulina Fusi M.D.   On: 07/03/2016 10:13   Ct Chest Wo Contrast  Result Date: 07/05/2016 CLINICAL DATA:  82 year old female with acute shortness of breath and hypoxia. Pleural effusion on recent chest radiograph. EXAM: CT CHEST WITHOUT  CONTRAST TECHNIQUE: Multidetector CT imaging of the chest was performed following the standard protocol without IV contrast. COMPARISON:  07/05/2016 and prior radiographs FINDINGS: Cardiovascular: Cardiomegaly noted. Thoracic aortic atherosclerotic calcifications noted without aneurysm. A moderate to large pericardial effusion is identified. Mediastinum/Nodes: A large hiatal hernia is noted. No enlarged lymph nodes identified. A right substernal goiter is present. Lungs/Pleura: Small moderate bilateral pleural effusions are noted, right greater than left. Right lower lobe consolidation/ atelectasis and left basilar atelectasis identified. There is no evidence of pneumothorax. Centrilobular emphysema is identified. Upper Abdomen: No acute abnormality. Musculoskeletal: A 50% anterior wedge compression fracture of L1 is age indeterminate. IMPRESSION: Moderate to large pericardial effusion and small to moderate bilateral pleural effusions, right greater than left. Right lower lobe consolidation/ atelectasis and left basilar atelectasis. Age indeterminate L1 compression fracture. Cardiomegaly. Large hiatal hernia. Substernal goiter. Electronically Signed   By: Harmon Pier M.D.   On: 07/05/2016 18:14   Mr Brain Wo Contrast  Result Date: 07/03/2016  CLINICAL DATA:  Tremors an worsening right-sided weakness. EXAM: MRI HEAD WITHOUT CONTRAST TECHNIQUE: Multiplanar, multiecho pulse sequences of the brain and surrounding structures were obtained without intravenous contrast. COMPARISON:  CT same day.  MRI 08/28/2015. FINDINGS: Brain: Diffusion imaging does not show any acute or subacute infarction. The brainstem is normal. No focal cerebellar insult. Cerebral hemispheres show atrophy with chronic small-vessel ischemic changes of the white matter. No large vessel territory infarction. No mass lesion, hemorrhage, hydrocephalus or extra-axial collection. Vascular: Major vessels at the base of the brain show flow. Skull and  upper cervical spine: Negative Sinuses/Orbits: Clear Other: No significant finding. IMPRESSION: No acute finding. Atrophy and chronic small vessel disease. No cause of the presenting symptoms is identified. Electronically Signed   By: Paulina FusiMark  Shogry M.D.   On: 07/03/2016 15:24   [place] Carotid Bilateral  Result Date: 07/03/2016 CLINICAL DATA:  CVA. EXAM: BILATERAL CAROTID DUPLEX ULTRASOUND TECHNIQUE: Wallace CullensGray scale imaging, color Doppler and duplex ultrasound were performed of bilateral carotid and vertebral arteries in the neck. COMPARISON:  08/28/2015 FINDINGS: Criteria: Quantification of carotid stenosis is based on velocity parameters that correlate the residual internal carotid diameter with NASCET-based stenosis levels, using the diameter of the distal internal carotid lumen as the denominator for stenosis measurement. The following velocity measurements were obtained: RIGHT ICA:  110 cm/sec CCA:  86 cm/sec SYSTOLIC ICA/CCA RATIO:  1.3 DIASTOLIC ICA/CCA RATIO:  0.7 ECA:  72 cm/sec LEFT ICA:  79 cm/sec CCA:  68 cm/sec SYSTOLIC ICA/CCA RATIO:  1.2 DIASTOLIC ICA/CCA RATIO:  1.6 ECA:  55 cm/sec RIGHT CAROTID ARTERY: Small amount of echogenic plaque in the distal common carotid artery and bulb. Small amount of echogenic plaque in the proximal internal carotid artery without significant stenosis. Normal waveforms and velocities in the right internal carotid artery. RIGHT VERTEBRAL ARTERY: Antegrade flow and normal waveform in the right vertebral artery. LEFT CAROTID ARTERY: Small amount of echogenic plaque at the left carotid bulb. Small amount of echogenic plaque in the proximal internal carotid artery. Normal waveforms and velocities in left internal carotid artery. LEFT VERTEBRAL ARTERY: Antegrade flow and normal waveform in the left vertebral artery. IMPRESSION: Mild atherosclerotic disease in the carotid arteries bilaterally. Estimated degree of stenosis in the internal carotid arteries is less than 50% bilaterally.  Patent vertebral arteries with antegrade flow. Electronically Signed   By: Richarda OverlieAdam  Henn M.D.   On: 07/03/2016 12:21   [place] Venous Img Lower Bilateral  Result Date: 07/06/2016 CLINICAL DATA:  Patient with altered mental status. Lower extremity swelling. Evaluate for DVT. EXAM: BILATERAL LOWER EXTREMITY VENOUS DOPPLER ULTRASOUND TECHNIQUE: Gray-scale sonography with graded compression, as well as color Doppler and duplex ultrasound were performed to evaluate the lower extremity deep venous systems from the level of the common femoral vein and including the common femoral, femoral, profunda femoral, popliteal and calf veins including the posterior tibial, peroneal and gastrocnemius veins when visible. The superficial great saphenous vein was also interrogated. Spectral Doppler was utilized to evaluate flow at rest and with distal augmentation maneuvers in the common femoral, femoral and popliteal veins. COMPARISON:  None. FINDINGS: RIGHT LOWER EXTREMITY Common Femoral Vein: No evidence of thrombus. Normal compressibility, respiratory phasicity and response to augmentation. Saphenofemoral Junction: No evidence of thrombus. Normal compressibility and flow on color Doppler imaging. Profunda Femoral Vein: No evidence of thrombus. Normal compressibility and flow on color Doppler imaging. Femoral Vein: No evidence of thrombus. Normal compressibility, respiratory phasicity and response to augmentation. Popliteal Vein: No evidence of thrombus. Normal  compressibility, respiratory phasicity and response to augmentation. Calf Veins: No evidence of thrombus. Normal compressibility and flow on color Doppler imaging. Superficial Great Saphenous Vein: No evidence of thrombus. Normal compressibility and flow on color Doppler imaging. Venous Reflux:  None. Other Findings:  None. LEFT LOWER EXTREMITY Common Femoral Vein: No evidence of thrombus. Normal compressibility, respiratory phasicity and response to augmentation. Saphenofemoral  Junction: No evidence of thrombus. Normal compressibility and flow on color Doppler imaging. Profunda Femoral Vein: No evidence of thrombus. Normal compressibility and flow on color Doppler imaging. Femoral Vein: No evidence of thrombus. Normal compressibility, respiratory phasicity and response to augmentation. Popliteal Vein: No evidence of thrombus. Normal compressibility, respiratory phasicity and response to augmentation. Calf Veins: No evidence of thrombus. Normal compressibility and flow on color Doppler imaging. Superficial Great Saphenous Vein: No evidence of thrombus. Normal compressibility and flow on color Doppler imaging. Venous Reflux:  None. Other Findings:  None. IMPRESSION: No evidence of deep venous thrombosis. Electronically Signed   By: Annia Belt M.D.   On: 07/06/2016 15:55     Assessment and Plan  Principal Problem:   Acute respiratory failure (HCC) Active Problems:   CKD (chronic kidney disease), stage III   Pericardial effusion   Bilateral pleural effusion    1. Report of hypoxia, O2 sat of 87% -Improved -No ABG -Pulmonary HTN on echo -Consider VQ scan to evaluate for PE, defer to primary team -Hold Lasix given bump in renal function  2. Moderate pleural effusion: -Incidentally noted on echo 9/14. Normal EF. No evidence of hemodynamic compromise.  -Follow up echo as an outpatient  3. Sinus bradycardia: -Stable -No evidence of clear symptoms or hemodynamic compromise associated with bradycardia.   -No evidence of high grade heart block  She has a prior documented h/o bradycardia with admission in 08/2015 for brady in the setting of hyperkalemia and ? blocker therapy.  -Continue to avoid betablocker or rate control meds -Outpt holter -She is on aricept, which may contribute to bradycardia.  As she is asymptomatic and w/o high grade heart block, assuming that aricept is helping with her dementia, benefits likely outweigh risk  4. Reported heart rate in the 200  bpm: -This was artifact  5. Pulmonary HTN: -Status post diuresis, output remains positive this admission  -Lasix on hold this morning 2/2 acute on CKD  6. Acute on CKD stage III: -As above -Per IM   Signed, Eula Listen, PA-C [facility] Pager: [phone] 07/07/2016, 9:28 AM

## 2016-07-07 NOTE — Care Management (Signed)
Patient was discharged 9/16 after observation stay back to [facility].  Upon arrival via ems at her facility on 9/16 it was noticed that her oxygen sats were 86%.  She had been on 02 during the observation stay but there were room air sats documented > 90%.  Discussed with attending.  At present she is on 1 liter and satting 97%.  Discussed the need for patient to be assessed for continuous 02 prior to discharge since oxygenation issues caused return.  No services were set up from previous discharge.  Will request home health nurse to follow respiratory status.   Patient is negative for DVT.

## 2016-07-07 NOTE — Plan of Care (Signed)
Problem: Safety: Goal: Ability to remain free from injury will improve Outcome: Progressing Fall precautions in place  Problem: Tissue Perfusion: Goal: Risk factors for ineffective tissue perfusion will decrease Outcome: Progressing SQ heparin

## 2016-07-07 NOTE — Progress Notes (Signed)
Patient slept throughout the night. No complaints of pain.  Pt. Run sinus brady ( mid 50's ). Respiratory rate around 12 breaths per minute, occasionally would drop at 10 breaths per minute. SpO2 95% on 1l/min No SOB or signs of acute distress. Will continue to monitor pt.  Romon Devereux, RN

## 2016-07-07 NOTE — Progress Notes (Signed)
An RT protocol assessment was performed on pt. She had a score of 5. Because of this her nebulizer treatments were changed to Q4 prn. She will ask for the treatment if needed.

## 2016-07-08 DIAGNOSIS — N183 Chronic kidney disease, stage 3 (moderate): Secondary | ICD-10-CM

## 2016-07-08 DIAGNOSIS — I471 Supraventricular tachycardia: Secondary | ICD-10-CM

## 2016-07-08 DIAGNOSIS — J9601 Acute respiratory failure with hypoxia: Secondary | ICD-10-CM

## 2016-07-08 DIAGNOSIS — J90 Pleural effusion, not elsewhere classified: Secondary | ICD-10-CM

## 2016-07-08 DIAGNOSIS — R0902 Hypoxemia: Secondary | ICD-10-CM

## 2016-07-08 LAB — BASIC METABOLIC PANEL
Anion gap: 5 (ref 5–15)
BUN: 33 mg/dL — AB (ref 6–20)
CO2: 31 mmol/L (ref 22–32)
Calcium: 8.5 mg/dL — ABNORMAL LOW (ref 8.9–10.3)
Chloride: 106 mmol/L (ref 101–111)
Creatinine, Ser: 1.67 mg/dL — ABNORMAL HIGH (ref 0.44–1.00)
GFR calc Af Amer: 32 mL/min — ABNORMAL LOW (ref >60)
GFR calc non Af Amer: 27 mL/min — ABNORMAL LOW (ref >60)
GLUCOSE: 106 mg/dL — AB (ref 65–99)
Potassium: 3.8 mmol/L (ref 3.5–5.1)
Sodium: 142 mmol/L (ref 135–145)

## 2016-07-08 NOTE — Care Management (Signed)
Heads up notice for home 02 to [facility]. Lleft message with Lup[facility]ashDonna at [facility] for the name of the home health agency that will follow patient for nursing

## 2016-07-08 NOTE — Care Management Important Message (Signed)
Important Message  Patient Details  Name: Stacey ManchesterMiriam Baker MRN: [id number] Date of Birth: 09/25/1932   Medicare Important Message Given:  Yes    Eber HongGreene, Norberta Stobaugh R, RN 07/08/2016, 1:46 PM

## 2016-07-08 NOTE — Clinical Social Work Note (Signed)
Clinical Social Work Assessment  Patient Details  Name: Stacey Baker MRN: [id number] Date of Birth: 11/13/1931  Date of referral:  07/08/16               Reason for consulGlorianne Manchestert:  Discharge Planning                Permission sought to share information with:  Family Supports Permission granted to share information::  Yes, Verbal Permission Granted  Name::     Stacey Baker,Stacey Baker [phone]   Agency::  [facility]  Relationship::     Contact Information:     Housing/Transportation Living arrangements for the past 2 months:  Assisted Living Facility Source of Information:  Patient Patient Interpreter Needed:  None Criminal Activity/Legal Involvement Pertinent to Current Situation/Hospitalization:  No - Comment as needed Significant Relationships:  Adult Children, Other Family Members Lives with:  Facility Resident Do you feel safe going back to the place where you live?  Yes Need for family participation in patient care:  No (Coment)  Care giving concerns:  Patient states she is looking forward to returning back to ALF.   Social Worker assessment / plan:  Patient is a pleasant 82 year old female who is a resident at [facility].  Patient has some dementia, and is alert and oriented x3.  Patient states she has been pleased with the care she is  Provided at ALF and is looking forward to returning back to facility.  Patient states she uses a wheelchair to get around but does receive some assistance with bathing and dressing.  Patient states she has a Baker and grandaughter who visit her frequently and live close to ALF.  Patient does not have any concerns about returning back to ALF.  Employment status:  Retired Health and safety inspectornsurance information:  Medicare PT Recommendations:  Not assessed at this time Information / Referral to community resources:     Patient/Family's Response to care:  Patient in agreement to returning back to ALF  Patient/Family's Understanding of and  Emotional Response to Diagnosis, Current Treatment, and Prognosis: Patient aware that she is not able to walk as well as she used to.  Patient expressed that she is scared of walking, and is happy using her wheelchair.  Emotional Assessment Appearance:  Appears stated age Attitude/Demeanor/Rapport:    Affect (typically observed):  Accepting, Calm, Pleasant Orientation:  Oriented to Self, Oriented to Place, Oriented to Situation Alcohol / Substance use:  Not Applicable Psych involvement (Current and /or in the community):  No (Comment)  Discharge Needs  Concerns to be addressed:  No discharge needs identified Readmission within the last 30 days:  Yes (07/05/16 patient's stats dropped when she was on her way back to ALF) Current discharge risk:  Chronically ill Barriers to Discharge:  Continued Medical Work up   Stacey Baker, Stacey Baker R 07/08/2016, 4:56 PM

## 2016-07-08 NOTE — Progress Notes (Signed)
[facility] [place] NAME: Stacey Baker    MR#:  [id number]  DATE OF BIRTH:  May 10, 1932  SUBJECTIVE:  CHIEF COMPLAINT:   Chief Complaint  Patient presents with  . Shortness of Breath   The patient is a 82 year old Caucasian female with past medical history significant for history of CK D stage III, dementia, depression, hyperlipidemia, hypertension, Parkinson's disease, stroke with residual right-sided weakness, who presents to the hospital with complaints of worsening right-sided weakness and tremors. Apparently patient was found slumped onto the right side, she was brought to the hospital for further evaluation. CT scan of the head revealed no acute changes, she was admitted for possible TIA. She underwent stroke workup, including MRI of the brain, revealing no acute findings, atrophy and chronic small vessel disease, ultrasound of carotid arteries, revealing mild atherosclerotic disease in the carotid arteries bilaterally, estimated degree of stenosis in the internal carotid arteries with less than 50% bilaterally, echocardiogram showed moderate sized pericardial effusion, normal ejection fraction, no wall motion abnormalities, severely elevated pulmonary arterial pressures. The patient was also noted to be bradycardic with heart rate dipping down to 30s to 40s intermittently, however, patient's blood pressure was maintained. Cardiology consultation was obtained, recommended no therapy for pericardial effusion,to repeat echocardiogram as outpatient<  Holter monitor was recommended to be placed prior to discharge. The patient will be evaluated by physical therapist and recommendations will be made in regards to discharge. Speech therapist saw patient in consultation and recommended regular diet with thin liquids. Patient was discharged to assisted living facility only to return back with hypoxia, O2 sat of 86% on room air. Chest x-ray revealed  bilateral pleural effusions, CT scan showed pericardial effusion, right lower lobe consolidation/atelectasis, left basilar atelectasis.  Had multiple urinations with Iv lasix , still need oxygen. Had some runs of Tachycardia on tele.   Review of Systems  Constitutional: Negative for chills, fever and weight loss.  HENT: Negative for congestion.   Eyes: Negative for blurred vision and double vision.  Respiratory: Negative for cough, sputum production, shortness of breath and wheezing.   Cardiovascular: Negative for chest pain, palpitations, orthopnea, leg swelling and PND.  Gastrointestinal: Negative for abdominal pain, blood in stool, constipation, diarrhea, nausea and vomiting.  Genitourinary: Negative for dysuria, frequency, hematuria and urgency.  Musculoskeletal: Negative for falls.  Neurological: Negative for dizziness, tremors, focal weakness and headaches.  Endo/Heme/Allergies: Does not bruise/bleed easily.  Psychiatric/Behavioral: Negative for depression. The patient does not have insomnia.     VITAL SIGNS: Blood pressure (!) 112/54, pulse 64, temperature 98 F (36.7 C), temperature source Oral, resp. rate 19, height _0  (1.6 m), weight 67.2 kg (148 lb 1.6 oz), SpO2 (!) 87 %.  PHYSICAL EXAMINATION:   GENERAL:  82 y.o.-year-old patient lying in the bed with no acute distress.  EYES: Pupils equal, round, reactive to light and accommodation. No scleral icterus. Extraocular muscles intact.  HEENT: Head atraumatic, normocephalic. Oropharynx and nasopharynx clear.  NECK:  Supple, no jugular venous distention. No thyroid enlargement, no tenderness.  LUNGS: Diminished breath sounds bilaterally at bases, no wheezing, rales,rhonchi or crepitation. No use of accessory muscles of respiration.  CARDIOVASCULAR: S1, S2 normal. No murmurs, rubs, or gallops.  ABDOMEN: Soft, nontender, nondistended. Bowel sounds present. No organomegaly or mass.  EXTREMITIES: 1+ lower extremity and pedal edema  bilaterally, no cyanosis, or clubbing.  NEUROLOGIC: Cranial nerves II through XII are intact. Muscle strength 5/5 in all extremities. Sensation intact.  Gait not checked.  PSYCHIATRIC: The patient is alert and oriented x 3.  SKIN: No obvious rash, lesion, or ulcer.   ORDERS/RESULTS REVIEWED:   CBC  Recent Labs Lab 07/03/16 0947 07/04/16 0503 07/05/16 0444 07/05/16 1651 07/06/16 0448 07/07/16 0420  WBC 5.0 3.5* 4.4 4.7 3.7  --   HGB 11.8* 11.5* 11.2* 12.4 11.1*  --   HCT 35.8 34.1* 32.8* 36.4 33.5*  --   PLT 122* 94* 97* 120* 92* 97*  MCV 87.2 87.6 87.2 86.3 87.9  --   MCH 28.7 29.5 29.7 29.4 29.3  --   MCHC 32.9 33.7 34.0 34.1 33.3  --   RDW 16.5* 15.8* 16.1* 15.9* 16.1*  --   LYMPHSABS 1.1  --   --  1.1  --   --   MONOABS 0.4  --   --  0.3  --   --   EOSABS 0.2  --   --  0.1  --   --   BASOSABS 0.0  --   --  0.0  --   --    ------------------------------------------------------------------------------------------------------------------  Chemistries   Recent Labs Lab 07/03/16 0947  07/05/16 0444 07/05/16 1651 07/06/16 0448 07/07/16 0420 07/08/16 0740  NA 143  < > 143 144 144 141 142  K 5.4*  < > 4.4 4.6 4.4 4.5 3.8  CL 114*  < > 110 109 112* 105 106  CO2 22  < > _0 GLUCOSE 97  < > 83 112* 75 85 106*  BUN 38*  < > 32* 32* 30* 35* 33*  CREATININE 1.81*  < > 1.38* 1.50* 1.28* 1.66* 1.67*  CALCIUM 8.4*  < > 8.4* 8.5* 8.2* 8.7* 8.5*  AST 24  --   --   --  12*  --   --   ALT 9*  --   --   --  <5*  --   --   ALKPHOS 101  --   --   --  78  --   --   BILITOT 0.5  --   --   --  0.3  --   --   < > = values in this interval not displayed. ------------------------------------------------------------------------------------------------------------------ estimated creatinine clearance is 23.9 mL/min (by C-G formula based on SCr of 1.67 mg/dL  (H)). ------------------------------------------------------------------------------------------------------------------ No results for input(s): TSH, T4TOTAL, T3FREE, THYROIDAB in the last 72 hours.  Invalid input(s): FREET3  Cardiac Enzymes  Recent Labs Lab 07/05/16 2302 07/06/16 0448 07/06/16 1101  TROPONINI <0.03 <0.03 <0.03   ------------------------------------------------------------------------------------------------------------------ Invalid input(s): POCBNP ---------------------------------------------------------------------------------------------------------------  RADIOLOGY: No results found.  EKG:  Orders placed or performed during the hospital encounter of 07/05/16  . ED EKG  . ED EKG  . EKG 12-Lead  . EKG 12-Lead    ASSESSMENT AND PLAN:  Principal Problem:   Acute respiratory failure (HCC) Active Problems:   CKD (chronic kidney disease), stage III   Pericardial effusion   Pleural effusion   Hypoxia   Atrial tachycardia ([place])  #1. Acute on chronic likely respiratory failure with hypoxia, continue oxygen therapy, continue diuresis with Lasix, following in's and outs and weight , discussed with cardiologist. VQ scan not needed as pt have lung changes to explain hypoxia, Negative Doppler ultrasound of lower extremities to rule out DVT. Patient will require oxygen therapy at home due to pulmonary hypertension.  #2 . Pericardial effusion, seems to be stable, no signs of tamponade, follow vital signs closely with diuresis. ESR  is normal. Cardiologist does not recommend therapy.   advised to have follow up in office. #3. CK D stage III, follow with diuresis #4. Bilateral pleural effusions due to acute diastolic CHF, follow oxygenation with diuresis, decreased oxygen requirement now. #5. Thrombocytopenia, stable #6. Bradycardia, sinus, heart rate is between 50s to 60s at present, no beta blockers or calcium channel blockers are recommended. TSH was checked in  the past, normal Management plans discussed with the patient, family and they are in agreement. #7 tachycardia   Intermittent episodic   Noted on tele. Not a candidate for beta blocker, as had bradycardia.  DRUG ALLERGIES:  Allergies  Allergen Reactions  . Penicillins Other (See Comments)    Reaction:  Unknown   . Plavix [Clopidogrel Bisulfate] Other (See Comments)    Reaction:  Unknown     CODE STATUS:     Code Status Orders        Start     Ordered   07/05/16 1746  Full code  Continuous     07/05/16 1747    Code Status History    Date Active Date Inactive Code Status Order ID Comments User Context   07/03/2016 11:04 AM 07/03/2016  5:26 PM Full Code [id number]  Hillary Bow, MD ED   12/31/2015  5:42 PM 01/06/2016  7:12 PM Full Code [id number]  Henreitta Leber, MD Inpatient   09/13/2015 10:55 PM 09/15/2015  8:25 PM Full Code [id number]  Lance Coon, MD Inpatient   08/28/2015  8:14 AM 08/29/2015  4:49 PM DNR [id number]  Max Sane, MD Inpatient   08/27/2015 11:25 PM 08/28/2015  8:14 AM Full Code [id number]  Lytle Butte, MD ED      TOTAL TIME TAKING CARE OF THIS PATIENT: 40 minutes.   Discussed with cardiologist Vaughan Basta M.D on 07/08/2016 at 4:16 PM  Between 7am to 6pm - Pager - [phone]  After 6pm go to [email or web address] - password [email or web address] [facility]  Office  [phone]  CC: Primary care physician; Arvin Collard BETH, PA-C

## 2016-07-08 NOTE — Progress Notes (Signed)
SATURATION QUALIFICATIONS: (This note is used to comply with regulatory documentation for home oxygen)  Patient Saturations on Room Air at Rest = 87%  Patient Saturations on Room Air while Ambulating = 0%  Patient Saturations on 1 Liters of oxygen while Ambulating = 94%  Please briefly explain why patient needs home oxygen:

## 2016-07-08 NOTE — Care Management (Signed)
There have been concerns addressed regarding patient's level of care and whether her care needs can be met at the assisted living level of care.  She is incontinent and has to be  changed frequently.  Addressed these concerns with CSW and offer to contact [facility].  CSW decline and will contact the facility to address these concerns.  The documentation of assessment for continuous home 02 is not present.  Addressed again in progression.

## 2016-07-08 NOTE — Progress Notes (Signed)
Patient is hemodynamically,more awake and alert,up to chair,qualified for home oxygen.

## 2016-07-08 NOTE — Care Management (Addendum)
Patient has qualified for home 02 with a resting room air sat of 86-87. Primary nurse to document the assessment.  Informed CSW and asked to have facility staff come and assess patient today for return.

## 2016-07-08 NOTE — Care Management (Addendum)
[facility] and faxed referral.  REceived confirmation.  Notified [facility] of discharge for 9.20 for the oxygen.  order is present

## 2016-07-08 NOTE — [place] FL2 (Signed)
[place] MEDICAID FL2 LEVEL OF CARE SCREENING TOOL     IDENTIFICATION  Patient Name: Stacey Baker Birthdate: [birth year] Sex: female Admission Date (Current Location): 07/05/2016  [facility] and [place] Number:  Chiropodist and Address:  [facility], [address] [place], [place] [address]      Provider Number: [id number]  Attending Physician Name and Address:  Altamese Dilling, MD  Relative Name and Phone Number:     Sarra, Rachels [phone]       Current Level of Care: Hospital Recommended Level of Care: Assisted Living Facility Prior Approval Number:    Date Approved/Denied:   PASRR Number:    Discharge Plan: Domiciliary (Rest home)    Current Diagnoses: Patient Active Problem List   Diagnosis Date Noted  . Hypoxia   . Atrial tachycardia (HCC)   . Generalized weakness 07/05/2016  . Thrombocytopenia (HCC) 07/05/2016  . UTI (urinary tract infection) 07/05/2016  . Acute respiratory failure (HCC) 07/05/2016  . Pleural effusion 07/05/2016  . CKD (chronic kidney disease), stage III   . Pericardial effusion   . Sinus bradycardia   . Acute renal failure (ARF) (HCC) 12/31/2015  . Chronic renal insufficiency 09/15/2015  . Bradycardia 09/15/2015  . Cellulitis 09/13/2015  . Parkinson disease (HCC) 09/13/2015  . HTN (hypertension) 09/13/2015  . HLD (hyperlipidemia) 09/13/2015  . Depression 09/13/2015  . Dementia 09/13/2015  . Hyperkalemia 09/13/2015  . TIA (transient ischemic attack) 08/27/2015    Orientation RESPIRATION BLADDER Height & Weight     Self, Time, Situation, Place  O2 (1L) Incontinent Weight: 148 lb 1.6 oz (67.2 kg) Height:  5\' 3"  (160 cm)  BEHAVIORAL SYMPTOMS/MOOD NEUROLOGICAL BOWEL NUTRITION STATUS      Continent Diet  AMBULATORY STATUS COMMUNICATION OF NEEDS Skin   Limited Assist Verbally Normal                       Personal Care Assistance Level of Assistance   Bathing, Feeding, Dressing Bathing Assistance: Limited assistance Feeding assistance: Independent Dressing Assistance: Limited assistance     Functional Limitations Info  Sight, Hearing, Speech Sight Info: Adequate Hearing Info: Adequate Speech Info: Adequate    SPECIAL CARE FACTORS FREQUENCY  PT (By licensed PT)                    Contractures Contractures Info: Not present    Additional Factors Info  Allergies, Code Status Code Status Info: Full Code Allergies Info: PENICILLINS, PLAVIX CLOPIDOGREL BISULFATE            Current Medications (07/08/2016):  This is the current hospital active medication list Current Facility-Administered Medications  Medication Dose Route Frequency Provider Last Rate Last Dose  . acetaminophen (TYLENOL) tablet 650 mg  650 mg Oral Q6H PRN Marguarite Arbour, MD       Or  . acetaminophen (TYLENOL) suppository 650 mg  650 mg Rectal Q6H PRN Marguarite Arbour, MD      . amLODipine (NORVASC) tablet 10 mg  10 mg Oral Daily Marguarite Arbour, MD   10 mg at 07/08/16 0942  . aspirin EC tablet 81 mg  81 mg Oral Daily Marguarite Arbour, MD   81 mg at 07/08/16 0942  . atorvastatin (LIPITOR) tablet 10 mg  10 mg Oral QHS Marguarite Arbour, MD   10 mg at 07/07/16 2139  . bisacodyl (DULCOLAX) suppository 10 mg  10 mg Rectal Daily PRN Duane Lope  Sparks, MD      . carbidopa-levodopa (SINEMET IR) 25-100 MG per tablet immediate release 1 tablet  1 tablet Oral TID Marguarite ArbourJeffrey D Sparks, MD   1 tablet at 07/08/16 [phone]  . diclofenac sodium (VOLTAREN) 1 % transdermal gel 2 g  2 g Topical BID Marguarite ArbourJeffrey D Sparks, MD   2 g at 07/08/16 [phone]  . docusate sodium (COLACE) capsule 100 mg  100 mg Oral BID Marguarite ArbourJeffrey D Sparks, MD   100 mg at 07/08/16 0943  . escitalopram (LEXAPRO) tablet 10 mg  10 mg Oral Daily Marguarite ArbourJeffrey D Sparks, MD   10 mg at 07/08/16 0943  . ferrous sulfate tablet 325 mg  325 mg Oral BID Marguarite ArbourJeffrey D Sparks, MD   325 mg at 07/08/16 0943  . heparin injection 5,000 Units   5,000 Units Subcutaneous Q8H Marguarite ArbourJeffrey D Sparks, MD   5,000 Units at 07/07/16 2131  . ipratropium-albuterol (DUONEB) 0.5-2.5 (3) MG/3ML nebulizer solution 3 mL  3 mL Nebulization Q4H PRN Altamese DillingVaibhavkumar Haislee Corso, MD      . levETIRAcetam (KEPPRA) tablet 500 mg  500 mg Oral BID Marguarite ArbourJeffrey D Sparks, MD   500 mg at 07/08/16 0943  . mirtazapine (REMERON) tablet 15 mg  15 mg Oral QHS Marguarite ArbourJeffrey D Sparks, MD   15 mg at 07/07/16 2131  . ondansetron (ZOFRAN) tablet 4 mg  4 mg Oral Q6H PRN Marguarite ArbourJeffrey D Sparks, MD       Or  . ondansetron [facility](ZOFRAN) injection 4 mg  4 mg Intravenous Q6H PRN Marguarite ArbourJeffrey D Sparks, MD      . QUEtiapine (SEROQUEL) tablet 25 mg  25 mg Oral QHS Marguarite ArbourJeffrey D Sparks, MD   25 mg at 07/07/16 2132  . sodium chloride flush (NS) 0.9 % injection 3 mL  3 mL Intravenous Q12H Marguarite ArbourJeffrey D Sparks, MD   3 mL at 07/08/16 0949  . vitamin B-12 (CYANOCOBALAMIN) tablet 1,000 mcg  1,000 mcg Oral Daily Marguarite ArbourJeffrey D Sparks, MD   1,000 mcg at 07/08/16 [phone]     Discharge Medications: Please see discharge summary for a list of discharge medications.  Relevant Imaging Results:  Relevant Lab Results:   Additional Information SSN [id number]   [name], Eric R

## 2016-07-08 NOTE — Clinical Social Work Note (Signed)
MSW spoke with [name] [facility] in [facility] to [facility] returning [facility] to [facility].  [facility] requested FL2 and clinical notes to be faxed to facility MSW faxed requested documents.  [facility] reviewed clinical information and stated they will complete assessment tomorrow morning but should be able to accept patient back.  MSW was informed that patient's baseline is wheelchair bound, and the facility assists with patient's bathing, dressing, and toileting on days when she is struggling to complete herself.  Case manager has been informed about patient needing home health.  Ervin KnackEric R. Hassan Rowannterhaus, MSW [phone]  Mon-Fri 8a-4:30p 07/08/2016 4:45 PM

## 2016-07-08 NOTE — Progress Notes (Addendum)
Patient: Stacey Baker / Admit Date: 07/05/2016 / Date of Encounter: 07/08/2016, 9:42 AM   Subjective:  SOB improving on O2. Sleeping this AM, lethargic No complaints Tele reviewed, case discussed with case manager and hospitalist Several short runs of atrial tachycardia seen, rate up to 130 bpm, 15 beats at a time.  Otherwise rate of 60, rare PVC/APC  Review of Systems: Review of Systems  Constitutional: Positive for malaise/fatigue. Negative for chills, diaphoresis, fever and weight loss.  HENT: Negative for congestion.   Eyes: Negative for discharge and redness.  Respiratory: Positive for shortness of breath. Negative for cough, hemoptysis, sputum production and wheezing.   Cardiovascular: Negative for chest pain, palpitations, orthopnea, claudication, leg swelling and PND.  Gastrointestinal: Negative for abdominal pain, heartburn, nausea and vomiting.  Musculoskeletal: Negative for falls and myalgias.  Skin: Negative for rash.  Neurological: Positive for weakness. Negative for dizziness, tingling, tremors, sensory change, speech change, focal weakness and loss of consciousness.  Endo/Heme/Allergies: Does not bruise/bleed easily.  Psychiatric/Behavioral: Negative for substance abuse. The patient is not nervous/anxious.   All other systems reviewed and are negative.   Objective: Telemetry: NSR, 60's bom.   Physical Exam: Blood pressure 110/60, pulse 68, temperature 98.6 F (37 C), temperature source Oral, resp. rate 16, height 5\' 3"  (1.6 m), weight 148 lb 1.6 oz (67.2 kg), SpO2 94 %. Body mass index is 26.23 kg/m. General: Frail appearing, in no acute distress, sleeping this AM, difficult to arouse Head: Normocephalic, atraumatic, sclera non-icteric, no xanthomas, nares are without discharge. Neck: Negative for carotid bruits. JVP not elevated. Lungs: Decreased BS at the bases, poor inspiratory effort, Breathing is unlabored. Heart: RRR S1 S2 without murmurs, rubs, or gallops.   Abdomen: Soft, non-tender, non-distended with normoactive bowel sounds. No rebound/guarding. Extremities: No clubbing or cyanosis. No edema. Distal pedal pulses are 2+ and equal bilaterally. Neuro: Alert and oriented X 3. Moves all extremities spontaneously. Psych:  Responds to questions appropriately with a normal affect.   Intake/Output Summary (Last 24 hours) at 07/08/16 0942 Last data filed at 07/07/16 1413  Gross per 24 hour  Intake                0 ml  Output                0 ml  Net                0 ml    Inpatient Medications:  . amLODipine  10 mg Oral Daily  . aspirin EC  81 mg Oral Daily  . atorvastatin  10 mg Oral QHS  . carbidopa-levodopa  1 tablet Oral TID  . diclofenac sodium  2 g Topical BID  . docusate sodium  100 mg Oral BID  . escitalopram  10 mg Oral Daily  . ferrous sulfate  325 mg Oral BID  . heparin  5,000 Units Subcutaneous Q8H  . levETIRAcetam  500 mg Oral BID  . mirtazapine  15 mg Oral QHS  . QUEtiapine  25 mg Oral QHS  . sodium chloride flush  3 mL Intravenous Q12H  . vitamin B-12  1,000 mcg Oral Daily   Infusions:    Labs:  Recent Labs  07/07/16 0420 07/08/16 0740  NA 141 142  K 4.5 3.8  CL 105 106  CO2 31 31  GLUCOSE 85 106*  BUN 35* 33*  CREATININE 1.66* 1.67*  CALCIUM 8.7* 8.5*    Recent Labs  07/06/16 0448  AST  12*  ALT <5*  ALKPHOS 78  BILITOT 0.3  PROT 6.0*  ALBUMIN 3.2*    Recent Labs  07/05/16 1651 07/06/16 0448 07/07/16 0420  WBC 4.7 3.7  --   NEUTROABS 3.2  --   --   HGB 12.4 11.1*  --   HCT 36.4 33.5*  --   MCV 86.3 87.9  --   PLT 120* 92* 97*    Recent Labs  07/05/16 1651 07/05/16 2302 07/06/16 0448 07/06/16 1101  TROPONINI <0.03 <0.03 <0.03 <0.03   Invalid input(s): POCBNP No results for input(s): HGBA1C in the last 72 hours.   Weights: Filed Weights   07/06/16 0521 07/07/16 0444 07/08/16 0413  Weight: 152 lb 11.2 oz (69.3 kg) 152 lb 3.2 oz (69 kg) 148 lb 1.6 oz (67.2 kg)      Radiology/Studies:  Dg Chest 2 View  Result Date: 07/05/2016 CLINICAL DATA:  Recent TIA.  Shortness of breath. EXAM: CHEST  2 VIEW COMPARISON:  01/03/2016 FINDINGS: Patient is rotated to the right. Lungs are adequately inflated demonstrate moderate bilateral pleural effusions. Likely associated atelectasis in the lung bases. Stable cardiomegaly. There is a moderate compression fracture in the region of the upper lumbar spine age indeterminate. Remainder of the exam is unchanged. IMPRESSION: Bilateral moderate-sized pleural effusions. Likely associated bibasilar atelectasis. Moderate cardiomegaly unchanged. Moderate compression deformity over the upper lumbar spine age indeterminate. Electronically Signed   By: Elberta Fortisaniel  Boyle M.D.   On: 07/05/2016 16:28   Ct Head Wo Contrast  Result Date: 07/03/2016 CLINICAL DATA:  Code stroke. Mental status changes beginning 1.5 hours ago. EXAM: CT HEAD WITHOUT CONTRAST TECHNIQUE: Contiguous axial images were obtained from the base of the skull through the vertex without intravenous contrast. COMPARISON:  12/31/2015 FINDINGS: Brain: The brain shows generalized atrophy. There chronic small-vessel ischemic changes of the deep and subcortical white matter. No sign of acute infarction, mass lesion, hemorrhage, hydrocephalus or extra-axial collection. No hyperdense vessels. Vascular: No hyperdense vessels. Skull: Normal Sinuses/Orbits: Clear Other: None significant IMPRESSION: Atrophy and chronic small-vessel ischemic changes. No acute finding. These results were called by telephone at the time of interpretation on 07/03/2016 at 10:10 am to Dr. Nita SickleAROLINA VERONESE , who verbally acknowledged these results. Electronically Signed   By: Paulina FusiMark  Shogry M.D.   On: 07/03/2016 10:13   Ct Chest Wo Contrast  Result Date: 07/05/2016 CLINICAL DATA:  82 year old female with acute shortness of breath and hypoxia. Pleural effusion on recent chest radiograph. EXAM: CT CHEST WITHOUT  CONTRAST TECHNIQUE: Multidetector CT imaging of the chest was performed following the standard protocol without IV contrast. COMPARISON:  07/05/2016 and prior radiographs FINDINGS: Cardiovascular: Cardiomegaly noted. Thoracic aortic atherosclerotic calcifications noted without aneurysm. A moderate to large pericardial effusion is identified. Mediastinum/Nodes: A large hiatal hernia is noted. No enlarged lymph nodes identified. A right substernal goiter is present. Lungs/Pleura: Small moderate bilateral pleural effusions are noted, right greater than left. Right lower lobe consolidation/ atelectasis and left basilar atelectasis identified. There is no evidence of pneumothorax. Centrilobular emphysema is identified. Upper Abdomen: No acute abnormality. Musculoskeletal: A 50% anterior wedge compression fracture of L1 is age indeterminate. IMPRESSION: Moderate to large pericardial effusion and small to moderate bilateral pleural effusions, right greater than left. Right lower lobe consolidation/ atelectasis and left basilar atelectasis. Age indeterminate L1 compression fracture. Cardiomegaly. Large hiatal hernia. Substernal goiter. Electronically Signed   By: Harmon PierJeffrey  Hu M.D.   On: 07/05/2016 18:14   Mr Brain Wo Contrast  Result Date: 07/03/2016 CLINICAL  DATA:  Tremors an worsening right-sided weakness. EXAM: MRI HEAD WITHOUT CONTRAST TECHNIQUE: Multiplanar, multiecho pulse sequences of the brain and surrounding structures were obtained without intravenous contrast. COMPARISON:  CT same day.  MRI 08/28/2015. FINDINGS: Brain: Diffusion imaging does not show any acute or subacute infarction. The brainstem is normal. No focal cerebellar insult. Cerebral hemispheres show atrophy with chronic small-vessel ischemic changes of the white matter. No large vessel territory infarction. No mass lesion, hemorrhage, hydrocephalus or extra-axial collection. Vascular: Major vessels at the base of the brain show flow. Skull and  upper cervical spine: Negative Sinuses/Orbits: Clear Other: No significant finding. IMPRESSION: No acute finding. Atrophy and chronic small vessel disease. No cause of the presenting symptoms is identified. Electronically Signed   By: Paulina Fusi M.D.   On: 07/03/2016 15:24   US Carotid Bilateral  Result Date: 07/03/2016 CLINICAL DATA:  CVA. EXAM: BILATERAL CAROTID DUPLEX ULTRASOUND TECHNIQUE: Wallace Cullens scale imaging, color Doppler and duplex ultrasound were performed of bilateral carotid and vertebral arteries in the neck. COMPARISON:  08/28/2015 FINDINGS: Criteria: Quantification of carotid stenosis is based on velocity parameters that correlate the residual internal carotid diameter with NASCET-based stenosis levels, using the diameter of the distal internal carotid lumen as the denominator for stenosis measurement. The following velocity measurements were obtained: RIGHT ICA:  110 cm/sec CCA:  86 cm/sec SYSTOLIC ICA/CCA RATIO:  1.3 DIASTOLIC ICA/CCA RATIO:  0.7 ECA:  72 cm/sec LEFT ICA:  79 cm/sec CCA:  68 cm/sec SYSTOLIC ICA/CCA RATIO:  1.2 DIASTOLIC ICA/CCA RATIO:  1.6 ECA:  55 cm/sec RIGHT CAROTID ARTERY: Small amount of echogenic plaque in the distal common carotid artery and bulb. Small amount of echogenic plaque in the proximal internal carotid artery without significant stenosis. Normal waveforms and velocities in the right internal carotid artery. RIGHT VERTEBRAL ARTERY: Antegrade flow and normal waveform in the right vertebral artery. LEFT CAROTID ARTERY: Small amount of echogenic plaque at the left carotid bulb. Small amount of echogenic plaque in the proximal internal carotid artery. Normal waveforms and velocities in left internal carotid artery. LEFT VERTEBRAL ARTERY: Antegrade flow and normal waveform in the left vertebral artery. IMPRESSION: Mild atherosclerotic disease in the carotid arteries bilaterally. Estimated degree of stenosis in the internal carotid arteries is less than 50% bilaterally.  Patent vertebral arteries with antegrade flow. Electronically Signed   By: Richarda Overlie M.D.   On: 07/03/2016 12:21   US Venous Img Lower Bilateral  Result Date: 07/06/2016 CLINICAL DATA:  Patient with altered mental status. Lower extremity swelling. Evaluate for DVT. EXAM: BILATERAL LOWER EXTREMITY VENOUS DOPPLER ULTRASOUND TECHNIQUE: Gray-scale sonography with graded compression, as well as color Doppler and duplex ultrasound were performed to evaluate the lower extremity deep venous systems from the level of the common femoral vein and including the common femoral, femoral, profunda femoral, popliteal and calf veins including the posterior tibial, peroneal and gastrocnemius veins when visible. The superficial great saphenous vein was also interrogated. Spectral Doppler was utilized to evaluate flow at rest and with distal augmentation maneuvers in the common femoral, femoral and popliteal veins. COMPARISON:  None. FINDINGS: RIGHT LOWER EXTREMITY Common Femoral Vein: No evidence of thrombus. Normal compressibility, respiratory phasicity and response to augmentation. Saphenofemoral Junction: No evidence of thrombus. Normal compressibility and flow on color Doppler imaging. Profunda Femoral Vein: No evidence of thrombus. Normal compressibility and flow on color Doppler imaging. Femoral Vein: No evidence of thrombus. Normal compressibility, respiratory phasicity and response to augmentation. Popliteal Vein: No evidence of thrombus. Normal compressibility,  respiratory phasicity and response to augmentation. Calf Veins: No evidence of thrombus. Normal compressibility and flow on color Doppler imaging. Superficial Great Saphenous Vein: No evidence of thrombus. Normal compressibility and flow on color Doppler imaging. Venous Reflux:  None. Other Findings:  None. LEFT LOWER EXTREMITY Common Femoral Vein: No evidence of thrombus. Normal compressibility, respiratory phasicity and response to augmentation. Saphenofemoral  Junction: No evidence of thrombus. Normal compressibility and flow on color Doppler imaging. Profunda Femoral Vein: No evidence of thrombus. Normal compressibility and flow on color Doppler imaging. Femoral Vein: No evidence of thrombus. Normal compressibility, respiratory phasicity and response to augmentation. Popliteal Vein: No evidence of thrombus. Normal compressibility, respiratory phasicity and response to augmentation. Calf Veins: No evidence of thrombus. Normal compressibility and flow on color Doppler imaging. Superficial Great Saphenous Vein: No evidence of thrombus. Normal compressibility and flow on color Doppler imaging. Venous Reflux:  None. Other Findings:  None. IMPRESSION: No evidence of deep venous thrombosis. Electronically Signed   By: Annia Belt M.D.   On: 07/06/2016 15:55     Assessment and Plan  Principal Problem:   Acute respiratory failure (HCC) Active Problems:   CKD (chronic kidney disease), stage III   Pericardial effusion   Bilateral pleural effusion    1. Report of hypoxia, O2 sat of 87% Needs full eval for need for oxygen Likely multifactorial including Pulmonary HTN , pleural effusion  2. Mild to moderate pleural effusion: on echo 9/14 and CXR and CT scan Contributing to SOB  3. Pericardial effusion, moderate on ECHO, noted on CT scan Will need to monitor as an outpatient with repeat echo  4. Sinus bradycardia: RAtes in 60s overnight -No evidence of clear symptoms or hemodynamic compromise associated with bradycardia.   -No evidence of high grade heart block  h/o bradycardia with admission in 08/2015 for brady in the setting of hyperkalemia and ? blocker therapy.  -Continue to avoid betablocker or rate control meds -Outpt holter  5. Atrial tachycardia:  several runs of 130 BPM overnight and this AM, up to 15 beats Unable to start on B-blockers given hx of bradycardia outpt holter. Will arrange in follow up  6. Pulmonary HTN: Etiology unclear,  possible component of diastolic CHF, unable to exclude  Chronic thromboembolic dz -Lasix on hold  2/2 acute on CKD -TEDS as an outpt if possible  6. Acute on CKD stage III: -As above, stable, cr 1.6   Total encounter time more than 35 minutes  Greater than 50% was spent in counseling and coordination of care with the patient     Signed, Signed, Dossie Arbour, MD, Ph.D [facility]

## 2016-07-09 MED ORDER — CIPROFLOXACIN HCL 250 MG PO TABS: 250.0000 mg | ORAL_TABLET | Freq: Two times a day (BID) | ORAL | 0 refills | Status: DC

## 2016-07-09 NOTE — Discharge Summary (Addendum)
[facility] [place] NAME: Stacey Baker    MR#:  [id number]  DATE OF BIRTH:  [birth year]  DATE OF ADMISSION:  07/05/2016 ADMITTING PHYSICIAN: Idelle Crouch, MD  DATE OF DISCHARGE: 07/09/2016  PRIMARY CARE PHYSICIAN: Arvin Collard BETH, PA-C    ADMISSION DIAGNOSIS:  Pleural effusion [J90]  DISCHARGE DIAGNOSIS:  Principal Problem:   Acute respiratory failure (HCC) Active Problems:   CKD (chronic kidney disease), stage III   Pericardial effusion   Pleural effusion   Hypoxia   Atrial tachycardia ([place])   UTI  SECONDARY DIAGNOSIS:   Past Medical History:  Diagnosis Date  . CKD (chronic kidney disease), stage III    a. With acute worsening/AKI noted on several admissions.  . Dementia   . Depression   . Encephalopathy   . Hyperkalemia    a. 08/2015  . Hyperlipidemia   . Hypertension   . Parkinson disease ([place])   . Pericardial effusion    a. 06/2016 Echo: Ef 60-65%, no rwma, Gr1 DD, mild MR, mildly dil LA, PASP 5mHg, mod circumferential pericardial effusion - no hemodynamic compromise.  . Sinus bradycardia    a. 08/2015 in setting of beta blocker and hyperkalemia.  . [place]   . TIA (transient ischemic attack)    a. 08/2015 - essentially neg w/u for stroke.    HOSPITAL COURSE:   #1. Acute on chronic likely respiratory failure with hypoxia, continue oxygen therapy, continue diuresis with Lasix, following in's and outs and weight , discussed with cardiologist. VQ scan not needed as pt have lung changes to explain hypoxia, Negative Doppler ultrasound of lower extremities to rule out DVT. Patient will require oxygen therapy at home due to pulmonary hypertension.  #2 . Pericardial effusion, seems to be stable, no signs of tamponade, follow vital signs closely with diuresis. ESR  is normal. Cardiologist does not recommend therapy.   advised to have follow up in office. #3. CK D stage III, follow with diuresis #4.  Bilateral pleural effusions due to acute diastolic CHF, follow oxygenation with diuresis, decreased oxygen requirement now. #5. Thrombocytopenia, stable #6. Bradycardia, sinus, heart rate is between 50s to 60s at present, no beta blockers or calcium channel blockers are recommended. TSH was checked in the past, normal Management plans discussed with the patient, family and they are in agreement. #7 tachycardia   Intermittent episodic #8 UTI   As diagnosed on 07/03/16 , during last admission.    As per cx report sensitive to cipro.    Give orla cipro for 5 days.  DISCHARGE CONDITIONS:   Stable.  CONSULTS OBTAINED:  Treatment Team:  AWende Bushy MD  DRUG ALLERGIES:   Allergies  Allergen Reactions  . Penicillins Other (See Comments)    Reaction:  Unknown   . Plavix [Clopidogrel Bisulfate] Other (See Comments)    Reaction:  Unknown     DISCHARGE MEDICATIONS:   Current Discharge Medication List    CONTINUE these medications which have CHANGED   Details  ciprofloxacin (CIPRO) 250 MG tablet Take 1 tablet (250 mg total) by mouth 2 (two) times daily. Qty: 10 tablet, Refills: 0      CONTINUE these medications which have NOT CHANGED   Details  amLODipine (NORVASC) 10 MG tablet Take 1 tablet (10 mg total) by mouth daily. Qty: 30 tablet, Refills: 5    aspirin EC 81 MG tablet Take 81 mg by mouth daily.    atorvastatin (LIPITOR) 10 MG tablet  Take 10 mg by mouth at bedtime.    carbidopa-levodopa (SINEMET IR) 25-100 MG tablet Take 1 tablet by mouth 3 (three) times daily.    diclofenac sodium (VOLTAREN) 1 % GEL Apply 2 g topically 2 (two) times daily. Pt applies to left knee.    escitalopram (LEXAPRO) 10 MG tablet Take 10 mg by mouth daily.    ferrous sulfate 325 (65 FE) MG tablet Take 325 mg by mouth 2 (two) times daily.    levETIRAcetam (KEPPRA) 500 MG tablet Take 500 mg by mouth 2 (two) times daily.    mirtazapine (REMERON) 15 MG tablet Take 15 mg by mouth at bedtime.     QUEtiapine (SEROQUEL) 25 MG tablet Take 25 mg by mouth at bedtime.    vitamin B-12 (CYANOCOBALAMIN) 1000 MCG tablet Take 1,000 mcg by mouth daily.         DISCHARGE INSTRUCTIONS:    Check Blood pressure and weight daily. Follow with cardiology clinic.  If you experience worsening of your admission symptoms, develop shortness of breath, life threatening emergency, suicidal or homicidal thoughts you must seek medical attention immediately by calling 911 or calling your MD immediately  if symptoms less severe.  You Must read complete instructions/literature along with all the possible adverse reactions/side effects for all the Medicines you take and that have been prescribed to you. Take any new Medicines after you have completely understood and accept all the possible adverse reactions/side effects.   Please note  You were cared for by a hospitalist during your hospital stay. If you have any questions about your discharge medications or the care you received while you were in the hospital after you are discharged, you can call the unit and asked to speak with the hospitalist on call if the hospitalist that took care of you is not available. Once you are discharged, your primary care physician will handle any further medical issues. Please note that NO REFILLS for any discharge medications will be authorized once you are discharged, as it is imperative that you return to your primary care physician (or establish a relationship with a primary care physician if you do not have one) for your aftercare needs so that they can reassess your need for medications and monitor your lab values.    Today   CHIEF COMPLAINT:   Chief Complaint  Patient presents with  . Shortness of Breath    HISTORY OF PRESENT ILLNESS:  Stacey Baker  is a 82 y.o. female with a known history of CKD, dementia, and HTN recently admitted for presumed TIA with UTI. Was found to have a small pericardial effusion and  D/C'd to an ALF. Presents now with acute SOB and hypoxia. CXR reveals new bilateral pleural effusions. She is now admitted. Unable to obtain hx from patient due to dementia   VITAL SIGNS:  Blood pressure (!) 126/48, pulse (!) 58, temperature 97.8 F (36.6 C), resp. rate 17, height _0  (1.6 m), weight 67.9 kg (149 lb 9.6 oz), SpO2 96 %.  I/O:   Intake/Output Summary (Last 24 hours) at 07/09/16 1344 Last data filed at 07/08/16 2201  Gross per 24 hour  Intake              243 ml  Output                0 ml  Net              243 ml    PHYSICAL EXAMINATION:  GENERAL:  82 y.o.-year-old patient lying in the bed with no acute distress.  EYES: Pupils equal, round, reactive to light and accommodation. No scleral icterus. Extraocular muscles intact.  HEENT: Head atraumatic, normocephalic. Oropharynx and nasopharynx clear.  NECK:  Supple, no jugular venous distention. No thyroid enlargement, no tenderness.  LUNGS: Diminished breath sounds bilaterally at bases, no wheezing, rales,rhonchi or crepitation. No use of accessory muscles of respiration.  CARDIOVASCULAR: S1, S2 normal. No murmurs, rubs, or gallops.  ABDOMEN: Soft, nontender, nondistended. Bowel sounds present. No organomegaly or mass.  EXTREMITIES: 1+ lower extremity and pedal edema bilaterally, no cyanosis, or clubbing.  NEUROLOGIC: Cranial nerves II through XII are intact. Muscle strength 5/5 in all extremities. Sensation intact. Gait not checked.  PSYCHIATRIC: The patient is alert and oriented x 3.  SKIN: No obvious rash, lesion, or ulcer.   DATA REVIEW:   CBC  Recent Labs Lab 07/06/16 0448 07/07/16 0420  WBC 3.7  --   HGB 11.1*  --   HCT 33.5*  --   PLT 92* 97*    Chemistries   Recent Labs Lab 07/06/16 0448  07/08/16 0740  NA 144  < > 142  K 4.4  < > 3.8  CL 112*  < > 106  CO2 29  < > 31  GLUCOSE 75  < > 106*  BUN 30*  < > 33*  CREATININE 1.28*  < > 1.67*  CALCIUM 8.2*  < > 8.5*  AST 12*  --   --   ALT  <5*  --   --   ALKPHOS 78  --   --   BILITOT 0.3  --   --   < > = values in this interval not displayed.  Cardiac Enzymes  Recent Labs Lab 07/06/16 1101  TROPONINI <0.03    Microbiology Results  Results for orders placed or performed during the hospital encounter of 07/05/16  MRSA PCR Screening     Status: None   Collection Time: 07/05/16  7:58 PM  Result Value Ref Range Status   MRSA by PCR NEGATIVE NEGATIVE Final    Comment:        The GeneXpert MRSA Assay (FDA approved for NASAL specimens only), is one component of a comprehensive MRSA colonization surveillance program. It is not intended to diagnose MRSA infection nor to guide or monitor treatment for MRSA infections.     RADIOLOGY:  No results found.  EKG:   Orders placed or performed during the hospital encounter of 07/05/16  . ED EKG  . ED EKG  . EKG 12-Lead  . EKG 12-Lead      Management plans discussed with the patient, family and they are in agreement.  CODE STATUS:     Code Status Orders        Start     Ordered   07/05/16 1746  Full code  Continuous     07/05/16 1747    Code Status History    Date Active Date Inactive Code Status Order ID Comments User Context   07/03/2016 11:04 AM 07/03/2016  5:26 PM Full Code [id number]  Hillary Bow, MD ED   12/31/2015  5:42 PM 01/06/2016  7:12 PM Full Code [id number]  Henreitta Leber, MD Inpatient   09/13/2015 10:55 PM 09/15/2015  8:25 PM Full Code [id number]  Lance Coon, MD Inpatient   08/28/2015  8:14 AM 08/29/2015  4:49 PM DNR [id number]  Max Sane, MD Inpatient   08/27/2015 11:25 PM 08/28/2015  8:14  AM Full Code [id number]  Lytle Butte, MD ED      TOTAL TIME TAKING CARE OF THIS PATIENT: 35 minutes.    Vaughan Basta M.D on 07/09/2016 at 1:44 PM  Between 7am to 6pm - Pager - [phone]  After 6pm go to [email or web address] - password [email or web address] [email or web address] [place] [facility]  Office  [phone]  CC: Primary care physician; Arvin Collard BETH, PA-C   Note: This dictation was prepared with Dragon dictation along with smaller phrase technology. Any transcriptional errors that result from this process are unintentional.

## 2016-07-09 NOTE — Discharge Instructions (Signed)
Follow with PCP in 2 weeks. °

## 2016-07-09 NOTE — Progress Notes (Signed)
Pt. Discharge to [facility] telephoned facility to notify of pt. Discharge. Pt. O2 awaiting at facility. Prescription cipro sent with EMS. Discharge teaching and appointments made

## 2016-07-09 NOTE — Clinical Social Work Note (Addendum)
MS[facility] was informed by [facility] that [facility] can [facility] back today.  MSW notified case manager and physician, MSW to continue to follow patient's progress throughout discharge planning.  [facility].  Patient and family agreeable to plans will transport via ems, message left on patient's son [name] phone [phone].  Ervin KnackEric R. Hassan Rowannterhaus, MSW [phone]  Mon-Fri 8a-4:30p 07/09/2016 12:48 PM

## 2016-07-14 ENCOUNTER — Encounter: Payer: Self-pay | Admitting: *Deleted

## 2016-07-14 ENCOUNTER — Encounter: Payer: Medicare Other | Admitting: Cardiology

## 2016-07-17 ENCOUNTER — Emergency Department
Admission: EM | Admit: 2016-07-17 | Discharge: 2016-07-17 | Disposition: A | Discharge status: Home / Self Care | Payer: Medicare Other | Attending: Emergency Medicine | Admitting: Emergency Medicine

## 2016-07-17 DIAGNOSIS — Y929 Unspecified place or not applicable: Secondary | ICD-10-CM | POA: Insufficient documentation

## 2016-07-17 DIAGNOSIS — G20 Parkinson's disease: Secondary | ICD-10-CM | POA: Diagnosis present

## 2016-07-17 DIAGNOSIS — N183 Chronic kidney disease, stage 3 (moderate): Secondary | ICD-10-CM | POA: Insufficient documentation

## 2016-07-17 DIAGNOSIS — Y939 Activity, unspecified: Secondary | ICD-10-CM | POA: Diagnosis not present

## 2016-07-17 DIAGNOSIS — F0280 Dementia in other diseases classified elsewhere without behavioral disturbance: Secondary | ICD-10-CM | POA: Insufficient documentation

## 2016-07-17 DIAGNOSIS — I129 Hypertensive chronic kidney disease with stage 1 through stage 4 chronic kidney disease, or unspecified chronic kidney disease: Secondary | ICD-10-CM | POA: Diagnosis not present

## 2016-07-17 DIAGNOSIS — Y999 Unspecified external cause status: Secondary | ICD-10-CM | POA: Diagnosis not present

## 2016-07-17 DIAGNOSIS — F0390 Unspecified dementia without behavioral disturbance: Secondary | ICD-10-CM

## 2016-07-17 DIAGNOSIS — W19XXXA Unspecified fall, initial encounter: Secondary | ICD-10-CM | POA: Insufficient documentation

## 2016-07-17 NOTE — ED Provider Notes (Signed)
[facility] Emergency Department Provider Note    L5 caveat: Review of systems and history is limited by dementia    Time seen: ----------------------------------------- 7:46 PM on 07/17/2016 -----------------------------------------    I have reviewed the triage vital signs and the nursing notes.   HISTORY  Chief Complaint Fall    HPI Stacey Baker is a 82 y.o. female who presents to ER being brought from assisted living complaining of mechanical fall. Patient states she remembers the fall, she just slid out of bed.Patient has no complaints, states she has chronic difficulty walking due to arthritis. She states she is not hurting at all from the fall.   Past Medical History:  Diagnosis Date  . CKD (chronic kidney disease), stage III    a. With acute worsening/AKI noted on several admissions.  . Dementia   . Depression   . Encephalopathy   . Hyperkalemia    a. 08/2015  . Hyperlipidemia   . Hypertension   . Parkinson disease (HCC)   . Pericardial effusion    a. 06/2016 Echo: Ef 60-65%, no rwma, Gr1 DD, mild MR, mildly dil LA, PASP 72mmHg, mod circumferential pericardial effusion - no hemodynamic compromise.  . Sinus bradycardia    a. 08/2015 in setting of beta blocker and hyperkalemia.  . Stroke (HCC)   . TIA (transient ischemic attack)    a. 08/2015 - essentially neg w/u for stroke.    Patient Active Problem List   Diagnosis Date Noted  . Hypoxia   . Atrial tachycardia (HCC)   . Generalized weakness 07/05/2016  . Thrombocytopenia (HCC) 07/05/2016  . UTI (urinary tract infection) 07/05/2016  . Acute respiratory failure (HCC) 07/05/2016  . Pleural effusion 07/05/2016  . CKD (chronic kidney disease), stage III   . Pericardial effusion   . Sinus bradycardia   . Acute renal failure (ARF) (HCC) 12/31/2015  . Chronic renal insufficiency 09/15/2015  . Bradycardia 09/15/2015  . Cellulitis 09/13/2015  . Parkinson disease (HCC) 09/13/2015  .  HTN (hypertension) 09/13/2015  . HLD (hyperlipidemia) 09/13/2015  . Depression 09/13/2015  . Dementia 09/13/2015  . Hyperkalemia 09/13/2015  . TIA (transient ischemic attack) 08/27/2015    Past Surgical History:  Procedure Laterality Date  . ABDOMINAL HYSTERECTOMY    . CHOLECYSTECTOMY      Allergies Penicillins and Plavix [clopidogrel bisulfate]  Social History Social History  Substance Use Topics  . Smoking status: Never Smoker  . Smokeless tobacco: Never Used  . Alcohol use No    Review of Systems Patient denies any complaints at this time, limited by dementia  ____________________________________________   PHYSICAL EXAM:  VITAL SIGNS: ED Triage Vitals  Enc Vitals Group     BP 07/17/16 1934 121/61     Pulse Rate 07/17/16 1934 65     Resp 07/17/16 1934 20     Temp 07/17/16 1934 97.8 F (36.6 C)     Temp Source 07/17/16 1934 Oral     SpO2 07/17/16 1940 100 %     Weight 07/17/16 1934 149 lb (67.6 kg)     Height 07/17/16 1934 5\' 3"  (1.6 m)     Head Circumference --      Peak Flow --      Pain Score --      Pain Loc --      Pain Edu? --      Excl. in GC? --     Constitutional: Alert But disoriented, Well appearing and in no distress. Eyes: Conjunctivae are  normal. Normal extraocular movements. Cardiovascular: Normal rate, regular rhythm. No murmurs, rubs, or gallops. Respiratory: Normal respiratory effort without tachypnea nor retractions. Breath sounds are clear and equal bilaterally. No wheezes/rales/rhonchi. Gastrointestinal: Soft and nontender. Normal bowel sounds Musculoskeletal: Limited range of motion of both lower extremities, worse on the left. Neurologic:  Normal speech and language. No gross focal neurologic deficits are appreciated.  Skin:  Skin is warm, dry and intact. No rash noted. Psychiatric: Mood and affect are normal. Speech and behavior are normal.  ____________________________________________  ED COURSE:  Pertinent labs & imaging  results that were available during my care of the patient were reviewed by me and considered in my medical decision making (see chart for details). Clinical Course  Patients in no acute distress, we will attempt to ambulate. At this point she is asymptomatic.  Procedures  ____________________________________________  FINAL ASSESSMENT AND PLAN  Fall, dementia  Plan: Patient with mechanical fall but no injury. She is stable for outpatient follow-up with her doctor.   Emily Filbert, MD   Note: This dictation was prepared with Dragon dictation. Any transcriptional errors that result from this process are unintentional    Emily Filbert, MD 07/17/16 2101

## 2016-07-17 NOTE — ED Notes (Signed)
Attempt to have pt stand and walk.  Pt stated that she is not able to stand/walk per baseline.  She sts that she uses wheelchair at facility.  Attempted to have pt stand.  Pt stood w/ assist, pt sts she is physically at baseline.

## 2016-07-17 NOTE — ED Notes (Addendum)
[facility]. EMS called for D/C

## 2016-07-17 NOTE — ED Triage Notes (Signed)
[facility] w/ c/o mechanical fall.  Pt alert and oriented x 4, sts she remembers fall.  Pt sts that she "slid out the bed'.  Pt denies head injury, LOC, CP, SOB, dizziness, n/v/d, or recent fevers.  Pt sts she is at baseline as far as motor function.  Resp even and unlabored.  NAD.

## 2016-07-23 ENCOUNTER — Encounter: Payer: Self-pay | Admitting: Cardiology

## 2016-07-23 ENCOUNTER — Ambulatory Visit (INDEPENDENT_AMBULATORY_CARE_PROVIDER_SITE_OTHER): Payer: Medicare Other | Admitting: Cardiology

## 2016-07-23 VITALS — BP 110/60 | HR 63 | Ht 63.0 in

## 2016-07-23 DIAGNOSIS — I3139 Other pericardial effusion (noninflammatory): Secondary | ICD-10-CM

## 2016-07-23 DIAGNOSIS — I639 Cerebral infarction, unspecified: Secondary | ICD-10-CM | POA: Diagnosis not present

## 2016-07-23 DIAGNOSIS — I313 Pericardial effusion (noninflammatory): Secondary | ICD-10-CM | POA: Diagnosis not present

## 2016-07-23 DIAGNOSIS — R001 Bradycardia, unspecified: Secondary | ICD-10-CM | POA: Diagnosis not present

## 2016-07-23 NOTE — Patient Instructions (Signed)
Testing/Procedures: Your physician has requested that you have an echocardiogram. Echocardiography is a painless test that uses sound waves to create images of your heart. It provides your doctor with information about the size and shape of your heart and how well your heart's chambers and valves are working. This procedure takes approximately one hour. There are no restrictions for this procedure.    Follow-Up: Your physician recommends that you schedule a follow-up appointment in: 3 months with Dr. Ingal.  It was a pleasure seeing you today here in the office. Please do not hesitate to give us a call back if you have any further questions. [phone]  Travarus Trudo A. RN, BSN      

## 2016-07-23 NOTE — Progress Notes (Signed)
Cardiology Office Note   Date:  07/23/2016   ID:  Stacey Baker, DOB [birth year], MRN [id number]  Referring Doctor:  Merlene Pulling, PA-C   Cardiologist:   Almond Lint, MD   Reason for consultation:  Chief Complaint  Patient presents with  . Follow-up    no cp or sob. but has had some swelling in legs as usual.   Follow-up from the hospital   History of Present Illness: Stacey Baker is a 82 y.o. female without a prior cardiac history.  She does have several risk factors including HTN, HL, TIA/CVA, and CKD III, in addition to a prior h/o dementia, parkinson's, and intermittent hyperkalemia.    Patient was recently admitted 9/14 with tremors and worsening right sided wkns and has been found to have a moderate pericardial effusion and bradycardia. Plan from cardiac standpoint was outpatient holter monitor and echo to ffup pericardial effusion.  She was discharged to [facility] 07/05/2016 and sent back to ER when O2 levels were noted to be low in the 80s. It was documented to be 87% in the ER. She is not previously known to need oxygen. She is a poor historian because of her dementia. She does not volunteer any symptoms of shortness of breath, chest pain, lightheadedness, dizziness, orthopnea, PND, edema. She was discharged on oxygen therapy.  Since discharge, patient has been overall doing well, status quo. Shortness of breath is same as before. No chest pain. She has increasing tremors.  ROS:  Please see the history of present illness. Aside from mentioned under HPI, all other systems are reviewed and negative.     Past Medical History:  Diagnosis Date  . CKD (chronic kidney disease), stage III    a. With acute worsening/AKI noted on several admissions.  . Dementia   . Depression   . Encephalopathy   . Hyperkalemia    a. 08/2015  . Hyperlipidemia   . Hypertension   . Parkinson disease (HCC)   . Pericardial effusion    a. 06/2016 Echo: Ef 60-65%, no rwma, Gr1 DD, mild MR,  mildly dil LA, PASP , mod circumferential pericardial effusion - no hemodynamic compromise.  . Sinus bradycardia    a. 08/2015 in setting of beta blocker and hyperkalemia.  . Stroke (HCC)   . TIA (transient ischemic attack)    a. 08/2015 - essentially neg w/u for stroke.    Past Surgical History:  Procedure Laterality Date  . ABDOMINAL HYSTERECTOMY    . CHOLECYSTECTOMY       reports that she has never smoked. She has never used smokeless tobacco. She reports that she does not drink alcohol or use drugs.   family history includes Heart disease in her mother; Hypertension in her other.   Outpatient Medications Prior to Visit  Medication Sig Dispense Refill  . amLODipine (NORVASC) 10 MG tablet Take 1 tablet (10 mg total) by mouth daily. 30 tablet 5  . aspirin EC 81 MG tablet Take 81 mg by mouth daily.    Marland Kitchen atorvastatin (LIPITOR) 10 MG tablet Take 10 mg by mouth at bedtime.    . carbidopa-levodopa (SINEMET IR) 25-100 MG tablet Take 1 tablet by mouth 3 (three) times daily.    . ciprofloxacin (CIPRO) 250 MG tablet Take 1 tablet (250 mg total) by mouth 2 (two) times daily. 10 tablet 0  . diclofenac sodium (VOLTAREN) 1 % GEL Apply 2 g topically 2 (two) times daily. Pt applies to left knee.    . escitalopram (  LEXAPRO) 10 MG tablet Take 10 mg by mouth daily.    . ferrous sulfate 325 (65 FE) MG tablet Take 325 mg by mouth 2 (two) times daily.    Marland Kitchen. levETIRAcetam (KEPPRA) 500 MG tablet Take 500 mg by mouth 2 (two) times daily.    . mirtazapine (REMERON) 15 MG tablet Take 15 mg by mouth at bedtime.    Marland Kitchen. QUEtiapine (SEROQUEL) 25 MG tablet Take 25 mg by mouth at bedtime.    . vitamin B-12 (CYANOCOBALAMIN) 1000 MCG tablet Take 1,000 mcg by mouth daily.     No facility-administered medications prior to visit.      Allergies: Penicillins and Plavix [clopidogrel bisulfate]    PHYSICAL EXAM: VS:  BP 110/60 (BP Location: Left Arm, Patient Position: Sitting, Cuff Size: Normal)   Pulse 63    Ht 5\' 3"  (1.6 m)  , There is no height or weight on file to calculate BMI. Wt Readings from Last 3 Encounters:  07/17/16 149 lb (67.6 kg)  07/09/16 149 lb 9.6 oz (67.9 kg)  07/05/16 159 lb 11.2 oz (72.4 kg)    GENERAL:  well developed, well nourished, not in acute distress HEENT: normocephalic, pink conjunctivae, anicteric sclerae, no xanthelasma, normal dentition, oropharynx clear NECK:  no neck vein engorgement, JVP normal, no hepatojugular reflux, carotid upstroke brisk and symmetric, no bruit, no thyromegaly, no lymphadenopathy LUNGS:  good respiratory effort, clear to auscultation bilaterally CV:  PMI not displaced, no thrills, no lifts, S1 and S2 within normal limits, no palpable S3 or S4, no murmurs, no rubs, no gallops ABD:  Soft, nontender, nondistended, normoactive bowel sounds, no abdominal aortic bruit, no hepatomegaly, no splenomegaly MS: nontender back, kyphosis, no scoliosis, no joint deformities EXT:  2+ DP/PT pulses, no edema, no varicosities, no cyanosis, no clubbing SKIN: warm, nondiaphoretic, normal turgor, no ulcers NEUROPSYCH: alert, oriented to person, normal mood, appropriate affect  Recent Labs: 07/04/2016: TSH 2.943 07/05/2016: B Natriuretic Peptide 197.0 07/06/2016: ALT <5; Hemoglobin 11.1 07/07/2016: Platelets 97 07/08/2016: BUN 33; Creatinine, Ser 1.67; Potassium 3.8; Sodium 142   Lipid Panel    Component Value Date/Time   CHOL 130 08/28/2015 1124   CHOL 157 05/04/2014 0453   TRIG 37 08/28/2015 1124   TRIG 52 05/04/2014 0453   HDL 61 08/28/2015 1124   HDL 79 (H) 05/04/2014 0453   CHOLHDL 2.1 08/28/2015 1124   VLDL 7 08/28/2015 1124   VLDL 10 05/04/2014 0453   LDLCALC 62 08/28/2015 1124   LDLCALC 68 05/04/2014 0453     Other studies Reviewed:  EKG:  EKG from 07/05/2016 1702 was personally reviewed by me and showed SR 52. STeelvation is nonischemic- j point elevation.   EKG from 07/23/2016 was personally reviewed by me and it revealed sinus rhythm, 63  BPM.  Additional studies/ records that were reviewed personally reviewed by me today include:  Echo 07/03/2016: Left ventricle: The cavity size was normal. Systolic function was normal. The estimated ejection fraction was in the range of 60% to 65%. Wall motion was normal; there were no regional wall motion abnormalities. Doppler parameters are consistent with abnormal left ventricular relaxation (grade 1 diastolic dysfunction). - Mitral valve: There was mild regurgitation. - Left atrium: The atrium was mildly dilated. - Right ventricle: Systolic function was normal. - Pulmonary arteries: Systolic pressure was severely elevated. PA peak pressure: 72 mm Hg (S). - Pericardium, extracardiac: A moderate sized circumferential pericardial effusion was identified. No hemodynamic compromise noted.   ASSESSMENT AND PLAN: Report of  Hypoxia, O2 sat of 87%, improved on oxygen Multifactorial, pulmonary hypertension, pleural effusion  Moderate Pericardial Effusion Incidentally noted on echo on 9/14.  Normal LVEF.  No evidence of hemodynamic compromise.  No h/o chest pain.  Plan for f/u limited echo.  Sinus Bradycardia Paroxysmal Atrial tachycardia as noted in the hospital No evidence of clear symptoms or hemodynamic compromise associated with bradycardia.   No evidence of high grade heart block  She has a prior documented h/o bradycardia with admission in 08/2015 for brady in the setting of hyperkalemia and ? blocker therapy.  Continue to avoid betablocker or rate control meds Outpt holter if pt able to tolerate that procedure -- patient not too keen on doing this at this point her she has significant tremors. That might affect the quality of the test.   Current medicines are reviewed at length with the patient today.  The patient does not have concerns regarding medicines.  Labs/ tests ordered today include:  Orders Placed This Encounter  Procedures  . EKG 12-Lead  .  ECHOCARDIOGRAM COMPLETE    I had a lengthy and detailed discussion with the patient regarding diagnoses, prognosis, diagnostic options, treatment options , and side effects of medications.   Disposition:   FU with undersigned after tests In 3 months  Signed, Almond Lint, MD  07/23/2016 5:23 PM    [facility]  This note was generated in part with voice recognition software and I apologize for any typographical errors that were not detected and corrected.

## 2016-08-25 ENCOUNTER — Other Ambulatory Visit: Payer: Self-pay | Admitting: Cardiology

## 2016-08-25 ENCOUNTER — Ambulatory Visit (INDEPENDENT_AMBULATORY_CARE_PROVIDER_SITE_OTHER): Payer: Medicare Other

## 2016-08-25 ENCOUNTER — Other Ambulatory Visit: Payer: Self-pay

## 2016-08-25 DIAGNOSIS — I3139 Other pericardial effusion (noninflammatory): Secondary | ICD-10-CM

## 2016-08-25 DIAGNOSIS — I313 Pericardial effusion (noninflammatory): Secondary | ICD-10-CM

## 2016-09-04 ENCOUNTER — Emergency Department: Payer: Medicare Other

## 2016-09-04 ENCOUNTER — Encounter: Payer: Self-pay | Admitting: Emergency Medicine

## 2016-09-04 ENCOUNTER — Emergency Department
Admission: EM | Admit: 2016-09-04 | Discharge: 2016-09-04 | Disposition: A | Discharge status: Home / Self Care | Payer: Medicare Other | Attending: Emergency Medicine | Admitting: Emergency Medicine

## 2016-09-04 DIAGNOSIS — G20 Parkinson's disease: Secondary | ICD-10-CM | POA: Insufficient documentation

## 2016-09-04 DIAGNOSIS — R4781 Slurred speech: Secondary | ICD-10-CM | POA: Diagnosis present

## 2016-09-04 DIAGNOSIS — G459 Transient cerebral ischemic attack, unspecified: Secondary | ICD-10-CM | POA: Diagnosis not present

## 2016-09-04 DIAGNOSIS — I129 Hypertensive chronic kidney disease with stage 1 through stage 4 chronic kidney disease, or unspecified chronic kidney disease: Secondary | ICD-10-CM | POA: Diagnosis not present

## 2016-09-04 DIAGNOSIS — N183 Chronic kidney disease, stage 3 (moderate): Secondary | ICD-10-CM | POA: Diagnosis not present

## 2016-09-04 HISTORY — DX: Parkinson's disease: G20

## 2016-09-04 HISTORY — DX: Apraxia following cerebral infarction: I69.390

## 2016-09-04 HISTORY — DX: Major depressive disorder, single episode, unspecified: F32.9

## 2016-09-04 HISTORY — DX: Urinary tract infection, site not specified: N39.0

## 2016-09-04 HISTORY — DX: Parkinson's disease without dyskinesia, without mention of fluctuations: G20.A1

## 2016-09-04 HISTORY — DX: Unspecified dementia, unspecified severity, without behavioral disturbance, psychotic disturbance, mood disturbance, and anxiety: F03.90

## 2016-09-04 HISTORY — DX: Muscle weakness (generalized): M62.81

## 2016-09-04 HISTORY — DX: Iron deficiency anemia, unspecified: D50.9

## 2016-09-04 LAB — CBC WITH DIFFERENTIAL/PLATELET
Basophils Absolute: 0 10*3/uL (ref 0–0.1)
Basophils Relative: 0 %
Eosinophils Absolute: 0.2 10*3/uL (ref 0–0.7)
Eosinophils Relative: 4 %
HEMATOCRIT: 33.6 % — AB (ref 35.0–47.0)
HEMOGLOBIN: 10.8 g/dL — AB (ref 12.0–16.0)
LYMPHS ABS: 1.7 10*3/uL (ref 1.0–3.6)
LYMPHS PCT: 34 %
MCH: 28.2 pg (ref 26.0–34.0)
MCHC: 32.2 g/dL (ref 32.0–36.0)
MCV: 87.7 fL (ref 80.0–100.0)
Monocytes Absolute: 0.4 10*3/uL (ref 0.2–0.9)
Monocytes Relative: 9 %
NEUTROS ABS: 2.6 10*3/uL (ref 1.4–6.5)
NEUTROS PCT: 53 %
Platelets: 116 10*3/uL — ABNORMAL LOW (ref 150–440)
RBC: 3.83 MIL/uL (ref 3.80–5.20)
RDW: 15.9 % — ABNORMAL HIGH (ref 11.5–14.5)
WBC: 5 10*3/uL (ref 3.6–11.0)

## 2016-09-04 LAB — COMPREHENSIVE METABOLIC PANEL
ALT: 7 U/L — ABNORMAL LOW (ref 14–54)
AST: 18 U/L (ref 15–41)
Albumin: 3.8 g/dL (ref 3.5–5.0)
Alkaline Phosphatase: 91 U/L (ref 38–126)
Anion gap: 8 (ref 5–15)
BUN: 22 mg/dL — ABNORMAL HIGH (ref 6–20)
CHLORIDE: 112 mmol/L — AB (ref 101–111)
CO2: 21 mmol/L — AB (ref 22–32)
Calcium: 8.5 mg/dL — ABNORMAL LOW (ref 8.9–10.3)
Creatinine, Ser: 1.47 mg/dL — ABNORMAL HIGH (ref 0.44–1.00)
GFR, EST AFRICAN AMERICAN: 37 mL/min — AB (ref >60)
GFR, EST NON AFRICAN AMERICAN: 32 mL/min — AB (ref >60)
Glucose, Bld: 92 mg/dL (ref 65–99)
POTASSIUM: 4.4 mmol/L (ref 3.5–5.1)
SODIUM: 141 mmol/L (ref 135–145)
Total Bilirubin: 0.5 mg/dL (ref 0.3–1.2)
Total Protein: 7 g/dL (ref 6.5–8.1)

## 2016-09-04 LAB — URINALYSIS COMPLETE WITH MICROSCOPIC (ARMC ONLY)
Bilirubin Urine: NEGATIVE
Glucose, UA: NEGATIVE mg/dL
KETONES UR: NEGATIVE mg/dL
LEUKOCYTES UA: NEGATIVE
NITRITE: NEGATIVE
PROTEIN: NEGATIVE mg/dL
SPECIFIC GRAVITY, URINE: 1.011 (ref 1.005–1.030)
pH: 5 (ref 5.0–8.0)

## 2016-09-04 LAB — PROTIME-INR
INR: 1.09
Prothrombin Time: 14.1 seconds (ref 11.4–15.2)

## 2016-09-04 LAB — TROPONIN I

## 2016-09-04 LAB — APTT: APTT: 30 s (ref 24–36)

## 2016-09-04 MED ORDER — ASPIRIN EC 325 MG PO TBEC: 325.0000 mg | DELAYED_RELEASE_TABLET | Freq: Every day | ORAL | 3 refills | Status: AC

## 2016-09-04 NOTE — ED Triage Notes (Signed)
Pt presents [facility] ED [facility]. Per EMS pt has a hx of a stroke within the last few months. Per EMS pt was found in room this morning with worsened facial droop and slurred speech. EMS states R sided facial droop and R leg weakness from previous stroke at this time. Per EMS pt symptoms had resolved prior to their arrival. Pt is alert and following commands at this time.

## 2016-09-04 NOTE — Discharge Instructions (Signed)
Stacey Baker's tests were unremarkable. Neurology advised starting her on full dose aspirin daily instead of a baby aspirin.

## 2016-09-04 NOTE — ED Provider Notes (Signed)
[facility] Emergency Department Provider Note   ____________________________________________    I have reviewed the triage vital signs and the nursing notes.   HISTORY  Chief Complaint Cerebrovascular Accident     HPI Stacey Baker is a 82 y.o. female who presents with complaints of slurred speech. Patient reports when she woke up this morning she felt "very fatigued" and was unable to speak properly. Staff noted slurred speech. Patient reports that she knew the words she wanted to say but was unable to say them. She reports this has improved significantly and that she is at her baseline now. She does have a history of a prior stroke which has left her with right-sided weakness. She denies headache. No fevers or chills. No dysuria.   Past Medical History:  Diagnosis Date  . Apraxia following cerebral infarction   . CKD (chronic kidney disease), stage III    a. With acute worsening/AKI noted on several admissions.  . Dementia   . Dementia without behavioral disturbance   . Depression   . Encephalopathy   . Hyperkalemia    a. 08/2015  . Hyperlipidemia   . Hyperlipidemia   . Hypertension   . Iron deficiency anemia   . Major depressive disorder   . Muscle weakness   . Parkinson disease (HCC)   . Parkinson's disease (HCC)   . Pericardial effusion    a. 06/2016 Echo: Ef 60-65%, no rwma, Gr1 DD, mild MR, mildly dil LA, PASP , mod circumferential pericardial effusion - no hemodynamic compromise.  . Sinus bradycardia    a. 08/2015 in setting of beta blocker and hyperkalemia.  . Stroke (HCC)   . TIA (transient ischemic attack)    a. 08/2015 - essentially neg w/u for stroke.  Marland Kitchen UTI (urinary tract infection)     Patient Active Problem List   Diagnosis Date Noted  . Hypoxia   . Atrial tachycardia (HCC)   . Generalized weakness 07/05/2016  . Thrombocytopenia (HCC) 07/05/2016  . UTI (urinary tract infection) 07/05/2016  . Acute respiratory  failure (HCC) 07/05/2016  . Pleural effusion 07/05/2016  . CKD (chronic kidney disease), stage III   . Pericardial effusion   . Sinus bradycardia   . Acute renal failure (ARF) (HCC) 12/31/2015  . Chronic renal insufficiency 09/15/2015  . Bradycardia 09/15/2015  . Cellulitis 09/13/2015  . Parkinson disease (HCC) 09/13/2015  . HTN (hypertension) 09/13/2015  . HLD (hyperlipidemia) 09/13/2015  . Depression 09/13/2015  . Dementia 09/13/2015  . Hyperkalemia 09/13/2015  . TIA (transient ischemic attack) 08/27/2015    Past Surgical History:  Procedure Laterality Date  . ABDOMINAL HYSTERECTOMY    . APPENDECTOMY    . CHOLECYSTECTOMY      Prior to Admission medications   Medication Sig Start Date End Date Taking? Authorizing Provider  amLODipine (NORVASC) 10 MG tablet Take 1 tablet (10 mg total) by mouth daily. 09/15/15   Katharina Caper, MD  aspirin EC 325 MG tablet Take 1 tablet (325 mg total) by mouth daily. 09/04/16 09/04/17  Jene Every, MD  atorvastatin (LIPITOR) 10 MG tablet Take 10 mg by mouth at bedtime.    Historical Provider, MD  carbidopa-levodopa (SINEMET IR) 25-100 MG tablet Take 1 tablet by mouth 3 (three) times daily.    Historical Provider, MD  ciprofloxacin (CIPRO) 250 MG tablet Take 1 tablet (250 mg total) by mouth 2 (two) times daily. 07/09/16   Altamese Dilling, MD  diclofenac sodium (VOLTAREN) 1 % GEL Apply 2 g topically  2 (two) times daily. Pt applies to left knee.    Historical Provider, MD  escitalopram (LEXAPRO) 10 MG tablet Take 10 mg by mouth daily.    Historical Provider, MD  ferrous sulfate 325 (65 FE) MG tablet Take 325 mg by mouth 2 (two) times daily.    Historical Provider, MD  levETIRAcetam (KEPPRA) 500 MG tablet Take 500 mg by mouth 2 (two) times daily.    Historical Provider, MD  mirtazapine (REMERON) 15 MG tablet Take 15 mg by mouth at bedtime.    Historical Provider, MD  QUEtiapine (SEROQUEL) 25 MG tablet Take 25 mg by mouth at bedtime.     Historical Provider, MD  vitamin B-12 (CYANOCOBALAMIN) 1000 MCG tablet Take 1,000 mcg by mouth daily.    Historical Provider, MD     Allergies Penicillins and Plavix [clopidogrel bisulfate]  Family History  Problem Relation Age of Onset  . Heart disease Mother   . Hypertension Other     Social History Social History  Substance Use Topics  . Smoking status: Never Smoker  . Smokeless tobacco: Never Used  . Alcohol use No    Review of Systems  Constitutional: No fever/chills Eyes: No visual changes.  ENT: No neck pain Cardiovascular: Denies chest pain. Respiratory: Denies shortness of breath. Gastrointestinal: No abdominal pain.  No nausea, no vomiting.   Genitourinary: Negative for dysuria.  Skin: Negative for rash. Neurological: Denies new weakness  10-point ROS otherwise negative.  ____________________________________________   PHYSICAL EXAM:  VITAL SIGNS: ED Triage Vitals [09/04/16 0852]  Enc Vitals Group     BP (!) 152/70     Pulse Rate (!) 57     Resp 16     Temp 97.9 F (36.6 C)     Temp Source Oral     SpO2 92 %     Weight 147 lb (66.7 kg)     Height 5\' 3"  (1.6 m)     Head Circumference      Peak Flow      Pain Score      Pain Loc      Pain Edu?      Excl. in GC?     Constitutional: Alert. No acute distress. Pleasant and interactive Eyes: Conjunctivae are normal. PERRLA, EOMI  Nose: No congestion/rhinnorhea. Mouth/Throat: Mucous membranes are moist.   Neck:  Painless ROM Cardiovascular: Normal rate.. Grossly normal heart sounds.  Good peripheral circulation. Respiratory: Normal respiratory effort.  No retractions. Lungs CTAB. Gastrointestinal: Soft and nontender. No distention.  No CVA tenderness. Genitourinary: deferred Musculoskeletal: Mild lower extremity edema. Right leg weaker than the left which is apparently her baseline Neurologic:  Normal speech and language in the ED. Right leg weaker than the left which is apparently her  baseline. Mild right facial droop which is apparently baseline for her. No new cranial abnormalities noted Skin:  Skin is warm, dry and intact. No rash noted. Psychiatric: Mood and affect are normal. Speech and behavior are normal.  ____________________________________________   LABS (all labs ordered are listed, but only abnormal results are displayed)  Labs Reviewed  COMPREHENSIVE METABOLIC PANEL - Abnormal; Notable for the following:       Result Value   Chloride 112 (*)    CO2 21 (*)    BUN 22 (*)    Creatinine, Ser 1.47 (*)    Calcium 8.5 (*)    ALT 7 (*)    GFR calc non Af Amer 32 (*)    GFR calc Af  Amer 37 (*)    All other components within normal limits  CBC WITH DIFFERENTIAL/PLATELET - Abnormal; Notable for the following:    Hemoglobin 10.8 (*)    HCT 33.6 (*)    RDW 15.9 (*)    Platelets 116 (*)    All other components within normal limits  APTT  TROPONIN I  PROTIME-INR  URINALYSIS COMPLETEWITH MICROSCOPIC ([facility] ONLY)   ____________________________________________  EKG  ED ECG REPORT I, Jene EveryKINNER, Lowanda Cashaw, the attending physician, personally viewed and interpreted this ECG.  Date: 09/04/2016 EKG Time: 10:04 AM Rate: 50 Rhythm: normal sinus rhythm QRS Axis: normal Intervals: normal ST/T Wave abnormalities: normal Conduction Disturbances: none Narrative Interpretation: unremarkable  ____________________________________________  RADIOLOGY  CT head unremarkable ____________________________________________   PROCEDURES  Procedure(s) performed: No    Critical Care performed: No ____________________________________________   INITIAL IMPRESSION / ASSESSMENT AND PLAN / ED COURSE  Pertinent labs & imaging results that were available during my care of the patient were reviewed by me and considered in my medical decision making (see chart for details).  Patient presents with presentation most consistent with TIA given complete resolution of symptoms  at this point. Apparently she woke up with these symptoms as well. Because of the above she is certainly not a TPA candidate at this time  Clinical Course   Patient's workup is essentially unremarkable. She is at her baseline. Discussed with Dr. Thad Rangereynolds of neurology, given that the patient had TIA workup one month ago she recommends starting Plavix, discharging the patient and scheduling outpatient EEG via PCP. Discussed this with the patient who agrees with this plan.  Patient is allergic to plavix, discussed with Dr. Thad Rangereynolds, recommends full dose aspirin. ____________________________________________   FINAL CLINICAL IMPRESSION(S) / ED DIAGNOSES  Final diagnoses:  Transient cerebral ischemia, unspecified type      NEW MEDICATIONS STARTED DURING THIS VISIT:  New Prescriptions   ASPIRIN EC 325 MG TABLET    Take 1 tablet (325 mg total) by mouth daily.     Note:  This document was prepared using Dragon voice recognition software and may include unintentional dictation errors.    Jene Everyobert Makynna Manocchio, MD 09/04/16 1046

## 2016-10-29 ENCOUNTER — Encounter: Payer: Self-pay | Admitting: *Deleted

## 2016-10-29 ENCOUNTER — Ambulatory Visit: Payer: Medicare Other | Admitting: Cardiology

## 2017-01-10 ENCOUNTER — Emergency Department: Payer: Medicare Other

## 2017-01-10 ENCOUNTER — Emergency Department
Admission: EM | Admit: 2017-01-10 | Discharge: 2017-01-10 | Disposition: A | Discharge status: Home / Self Care | Payer: Medicare Other | Attending: Emergency Medicine | Admitting: Emergency Medicine

## 2017-01-10 DIAGNOSIS — I129 Hypertensive chronic kidney disease with stage 1 through stage 4 chronic kidney disease, or unspecified chronic kidney disease: Secondary | ICD-10-CM | POA: Diagnosis not present

## 2017-01-10 DIAGNOSIS — Z7982 Long term (current) use of aspirin: Secondary | ICD-10-CM | POA: Diagnosis not present

## 2017-01-10 DIAGNOSIS — N183 Chronic kidney disease, stage 3 (moderate): Secondary | ICD-10-CM | POA: Diagnosis not present

## 2017-01-10 DIAGNOSIS — R05 Cough: Secondary | ICD-10-CM | POA: Diagnosis present

## 2017-01-10 DIAGNOSIS — M25552 Pain in left hip: Secondary | ICD-10-CM | POA: Diagnosis not present

## 2017-01-10 DIAGNOSIS — Z79899 Other long term (current) drug therapy: Secondary | ICD-10-CM | POA: Insufficient documentation

## 2017-01-10 DIAGNOSIS — G20 Parkinson's disease: Secondary | ICD-10-CM | POA: Diagnosis not present

## 2017-01-10 DIAGNOSIS — J9811 Atelectasis: Secondary | ICD-10-CM | POA: Diagnosis not present

## 2017-01-10 LAB — CBC WITH DIFFERENTIAL/PLATELET
BASOS ABS: 0 10*3/uL (ref 0–0.1)
Basophils Relative: 1 %
EOS ABS: 0 10*3/uL (ref 0–0.7)
EOS PCT: 0 %
HCT: 32.3 % — ABNORMAL LOW (ref 35.0–47.0)
HEMOGLOBIN: 10.7 g/dL — AB (ref 12.0–16.0)
LYMPHS ABS: 0.7 10*3/uL — AB (ref 1.0–3.6)
Lymphocytes Relative: 20 %
MCH: 29.4 pg (ref 26.0–34.0)
MCHC: 33.2 g/dL (ref 32.0–36.0)
MCV: 88.6 fL (ref 80.0–100.0)
MONO ABS: 0.9 10*3/uL (ref 0.2–0.9)
Monocytes Relative: 24 %
NEUTROS PCT: 55 %
Neutro Abs: 2.1 10*3/uL (ref 1.4–6.5)
Platelets: 117 10*3/uL — ABNORMAL LOW (ref 150–440)
RBC: 3.64 MIL/uL — AB (ref 3.80–5.20)
RDW: 15.2 % — ABNORMAL HIGH (ref 11.5–14.5)
WBC: 3.7 10*3/uL (ref 3.6–11.0)

## 2017-01-10 LAB — COMPREHENSIVE METABOLIC PANEL
ALT: 6 U/L — AB (ref 14–54)
AST: 26 U/L (ref 15–41)
Albumin: 4 g/dL (ref 3.5–5.0)
Alkaline Phosphatase: 89 U/L (ref 38–126)
Anion gap: 7 (ref 5–15)
BUN: 23 mg/dL — AB (ref 6–20)
CHLORIDE: 108 mmol/L (ref 101–111)
CO2: 24 mmol/L (ref 22–32)
Calcium: 8.6 mg/dL — ABNORMAL LOW (ref 8.9–10.3)
Creatinine, Ser: 1.68 mg/dL — ABNORMAL HIGH (ref 0.44–1.00)
GFR calc Af Amer: 32 mL/min — ABNORMAL LOW (ref >60)
GFR, EST NON AFRICAN AMERICAN: 27 mL/min — AB (ref >60)
Glucose, Bld: 111 mg/dL — ABNORMAL HIGH (ref 65–99)
POTASSIUM: 4.6 mmol/L (ref 3.5–5.1)
SODIUM: 139 mmol/L (ref 135–145)
Total Bilirubin: 0.6 mg/dL (ref 0.3–1.2)
Total Protein: 7.5 g/dL (ref 6.5–8.1)

## 2017-01-10 LAB — TROPONIN I

## 2017-01-10 LAB — BRAIN NATRIURETIC PEPTIDE: B NATRIURETIC PEPTIDE 5: 230 pg/mL — AB (ref 0.0–100.0)

## 2017-01-10 NOTE — Discharge Instructions (Signed)
Please use Tylenol for the pain in your hip. Please have your doctor further evaluate your apparent atelectasis and  effusion on your chest x-ray

## 2017-01-10 NOTE — ED Notes (Signed)
Pt arrived via ems for c/o left hip pain for the last 2 days - she is noted to have a dry cough that she reports having the last few days with a runny nose but she is not taking anything for it - she denies any fall or injury that could be causing the left hip pain - pt has a lean to the right that she states is her normal

## 2017-01-10 NOTE — ED Notes (Signed)
Secretary notified to call [facility]

## 2017-01-10 NOTE — ED Provider Notes (Addendum)
[facility] Emergency Department Provider Note   ____________________________________________   First MD Initiated Contact with Patient 01/10/17 1119     (approximate)  I have reviewed the triage vital signs and the nursing notes.   HISTORY  Chief Complaint Cough and Hip Pain    HPI Stacey Baker is a 83 y.o. female patient comes in complaining of pain in her left hip. She reports it started today. She reports she occasionally has pain in her hips or other places but this is really interfering with her ability to walk. She does spend a lot of time in a wheelchair normally. She denies any rash any falls any other injury such just started hurting. This possibly worse with movement. She denies any fever chills or any other complaints. She does have a wet cough but is not too worried about it she says everybody in the nursing home has a cough and is sick.   Past Medical History:  Diagnosis Date  . Apraxia following cerebral infarction   . CKD (chronic kidney disease), stage III    a. With acute worsening/AKI noted on several admissions.  . Dementia   . Dementia without behavioral disturbance   . Depression   . Encephalopathy   . Hyperkalemia    a. 08/2015  . Hyperlipidemia   . Hyperlipidemia   . Hypertension   . Iron deficiency anemia   . Major depressive disorder   . Muscle weakness   . Parkinson disease (HCC)   . Parkinson's disease (HCC)   . Pericardial effusion    a. 06/2016 Echo: Ef 60-65%, no rwma, Gr1 DD, mild MR, mildly dil LA, PASP 72mmHg, mod circumferential pericardial effusion - no hemodynamic compromise.  . Sinus bradycardia    a. 08/2015 in setting of beta blocker and hyperkalemia.  . Stroke (HCC)   . TIA (transient ischemic attack)    a. 08/2015 - essentially neg w/u for stroke.  Marland Kitchen. UTI (urinary tract infection)     Patient Active Problem List   Diagnosis Date Noted  . Hypoxia   . Atrial tachycardia (HCC)   . Generalized  weakness 07/05/2016  . Thrombocytopenia (HCC) 07/05/2016  . UTI (urinary tract infection) 07/05/2016  . Acute respiratory failure (HCC) 07/05/2016  . Pleural effusion 07/05/2016  . CKD (chronic kidney disease), stage III   . Pericardial effusion   . Sinus bradycardia   . Acute renal failure (ARF) (HCC) 12/31/2015  . Chronic renal insufficiency 09/15/2015  . Bradycardia 09/15/2015  . Cellulitis 09/13/2015  . Parkinson disease (HCC) 09/13/2015  . HTN (hypertension) 09/13/2015  . HLD (hyperlipidemia) 09/13/2015  . Depression 09/13/2015  . Dementia 09/13/2015  . Hyperkalemia 09/13/2015  . TIA (transient ischemic attack) 08/27/2015    Past Surgical History:  Procedure Laterality Date  . ABDOMINAL HYSTERECTOMY    . APPENDECTOMY    . CHOLECYSTECTOMY      Prior to Admission medications   Medication Sig Start Date End Date Taking? Authorizing Provider  acetaminophen (TYLENOL) 325 MG tablet Take 650 mg by mouth every 6 (six) hours as needed.   Yes Historical Provider, MD  acidophilus (RISAQUAD) CAPS capsule Take 1 capsule by mouth 2 (two) times daily.   Yes Historical Provider, MD  amLODipine (NORVASC) 10 MG tablet Take 1 tablet (10 mg total) by mouth daily. 09/15/15  Yes Katharina Caperima Vaickute, MD  aspirin EC 325 MG tablet Take 1 tablet (325 mg total) by mouth daily. 09/04/16 09/04/17 Yes Jene Everyobert Kinner, MD  atorvastatin (LIPITOR) 10  MG tablet Take 10 mg by mouth at bedtime.   Yes Historical Provider, MD  carbidopa-levodopa (SINEMET IR) 25-100 MG tablet Take 1 tablet by mouth 3 (three) times daily.   Yes Historical Provider, MD  cyclobenzaprine (FLEXERIL) 10 MG tablet Take 10 mg by mouth 3 (three) times daily as needed for muscle spasms.   Yes Historical Provider, MD  diclofenac sodium (VOLTAREN) 1 % GEL Apply 2 g topically 2 (two) times daily. Pt applies to left knee.   Yes Historical Provider, MD  donepezil (ARICEPT) 10 MG tablet Take 10 mg by mouth daily. 12/22/16  Yes Historical Provider, MD    escitalopram (LEXAPRO) 10 MG tablet Take 10 mg by mouth daily.   Yes Historical Provider, MD  ferrous sulfate 325 (65 FE) MG tablet Take 325 mg by mouth 2 (two) times daily.   Yes Historical Provider, MD  levETIRAcetam (KEPPRA) 250 MG tablet Take 250 mg by mouth daily.    Yes Historical Provider, MD  loperamide (IMODIUM) 2 MG capsule Take 2 mg by mouth as needed for diarrhea or loose stools.   Yes Historical Provider, MD  mirtazapine (REMERON) 15 MG tablet Take 15 mg by mouth at bedtime.   Yes Historical Provider, MD  Multiple Vitamins-Minerals (PRESERVISION AREDS 2 PO) Take 2 capsules by mouth daily.   Yes Historical Provider, MD  polyethylene glycol (MIRALAX / GLYCOLAX) packet Take 17 g by mouth daily as needed for mild constipation, moderate constipation or severe constipation.   Yes Historical Provider, MD  QUEtiapine (SEROQUEL) 25 MG tablet Take 25 mg by mouth at bedtime.   Yes Historical Provider, MD  Soft Lens Products (EQ SALINE SOLUTION/SENSITIVE) SOLN Apply 1 application topically every other day. Apply to neck topically once every other day for wound until healed.   Yes Historical Provider, MD  vitamin B-12 (CYANOCOBALAMIN) 1000 MCG tablet Take 1,000 mcg by mouth daily.   Yes Historical Provider, MD    Allergies Penicillins and Plavix [clopidogrel bisulfate]  Family History  Problem Relation Age of Onset  . Heart disease Mother   . Hypertension Other     Social History Social History  Substance Use Topics  . Smoking status: Never Smoker  . Smokeless tobacco: Never Used  . Alcohol use No    Review of Systems Constitutional: No fever/chills Eyes: No visual changes. ENT: No sore throat. Cardiovascular: Denies chest pain. Respiratory: Denies shortness of breath. Gastrointestinal: No abdominal pain.  No nausea, no vomiting.  No diarrhea.  No constipation. Genitourinary: Negative for dysuria. Musculoskeletal: Negative for back pain. Skin: Negative for rash.  10-point  ROS otherwise negative.  ____________________________________________   PHYSICAL EXAM:  VITAL SIGNS: ED Triage Vitals  Enc Vitals Group     BP 01/10/17 1121 140/86     Pulse Rate 01/10/17 1121 74     Resp 01/10/17 1121 17     Temp 01/10/17 1121 98.4 F (36.9 C)     Temp Source 01/10/17 1121 Oral     SpO2 01/10/17 1121 98 %     Weight 01/10/17 1121 140 lb (63.5 kg)     Height 01/10/17 1121 5\' 3"  (1.6 m)     Head Circumference --      Peak Flow --      Pain Score 01/10/17 1122 7     Pain Loc --      Pain Edu? --      Excl. in GC? --    Constitutional: Alert and oriented. Well appearing and in  no acute distress. Eyes: Conjunctivae are normal. PERRL. EOMI. Head: Atraumatic. Nose: No congestion/rhinnorhea. Mouth/Throat: Mucous membranes are moist.  Oropharynx non-erythematous. Neck: No stridor.  Cardiovascular: Normal rate, regular rhythm. Grossly normal heart sounds.  Good peripheral circulation. Respiratory: Normal respiratory effort.  No retractions. Lungs CTAB! Gastrointestinal: Soft and nontender. No distention. No abdominal bruits. No CVA tenderness. Musculoskeletal: No lower extremity tenderness nor edema.  No joint effusions.There is a small amount of pain on palpation behind the left hip. But otherwise not really any pain on palpation of the hip there is no significant pain on pushing upwards on the heel Skin:  Skin is warm, dry and intact. No rash noted.   ____________________________________________   LABS (all labs ordered are listed, but only abnormal results are displayed)  Labs Reviewed  CBC WITH DIFFERENTIAL/PLATELET - Abnormal; Notable for the following:       Result Value   RBC 3.64 (*)    Hemoglobin 10.7 (*)    HCT 32.3 (*)    RDW 15.2 (*)    Platelets 117 (*)    Lymphs Abs 0.7 (*)    All other components within normal limits  BRAIN NATRIURETIC PEPTIDE - Abnormal; Notable for the following:    B Natriuretic Peptide 230.0 (*)    All other  components within normal limits  COMPREHENSIVE METABOLIC PANEL - Abnormal; Notable for the following:    Glucose, Bld 111 (*)    BUN 23 (*)    Creatinine, Ser 1.68 (*)    Calcium 8.6 (*)    ALT 6 (*)    GFR calc non Af Amer 27 (*)    GFR calc Af Amer 32 (*)    All other components within normal limits  TROPONIN I   ____________________________________________  EKG   ____________________________________________  RADIOLOGY  Study Result   CLINICAL DATA:  Patient with left hip pain for 2 days.  Dry cough.  EXAM: CHEST  2 VIEW  COMPARISON:  Chest radiograph 07/05/2016  FINDINGS: Patient is rotated to the right limiting evaluation. Stable cardiomegaly. Moderate hiatal hernia. Small right pleural effusion and underlying pulmonary consolidation. Thoracic spine degenerative changes. Known compression deformity of the lower thoracic spine not well demonstrated on current exam.  IMPRESSION: Small to moderate right pleural effusion with underlying pulmonary consolidation which may represent atelectasis or infection.  Moderate hiatal hernia.  Cardiomegaly.  Aortic atherosclerosis.   Electronically Signed   By: Annia Belt M.D.   On: 01/10/2017 12:16    Study Result   CLINICAL DATA:  Patient with left hip pain. No known injury. Initial encounter.  EXAM: DG HIP (WITH OR WITHOUT PELVIS) 2-3V LEFT  COMPARISON:  None.  FINDINGS: Osseous demineralization. Normal anatomic alignment. Lumbar spine degenerative changes. SI joints are unremarkable. Vascular calcifications. No acute osseous abnormality.  IMPRESSION: No acute osseous abnormality.   Electronically Signed   By: Annia Belt M.D.   On: 01/10/2017 12:18     ____________________________________________   PROCEDURES  Procedure(s) performed:   Procedures  Critical Care performed:   ____________________________________________   INITIAL IMPRESSION / ASSESSMENT AND PLAN / ED  COURSE  Pertinent labs & imaging results that were available during my care of the patient were reviewed by me and considered in my medical decision making (see chart for details).  Patient does not seem to have any hip pain with movement of the hip. Patient does not have a cough along with her. She does not have a fever and does not have a  white count or marked shift to the left on her differential. I will let her return to the nursing home with Tylenol for pain and have her doctor work on evaluating her chest x-ray findings.   Review of old x-rays chest CT shows that this effusion is actually much improved from previously .  ____________________________________________   FINAL CLINICAL IMPRESSION(S) / ED DIAGNOSES  Final diagnoses:  Left hip pain  Atelectasis      NEW MEDICATIONS STARTED DURING THIS VISIT:  New Prescriptions   No medications on file     Note:  This document was prepared using Dragon voice recognition software and may include unintentional dictation errors.    Arnaldo Natal, MD 01/10/17 1329    Arnaldo Natal, MD 01/10/17 [phone]

## 2017-01-10 NOTE — ED Notes (Addendum)
md notified that pt stated to nurse and tech that she is always in a wheelchair and does not ambulate - called [facility] to see what pt normal ambulatory status is -  Per the nursing home pt is transfer assist only and does not ambulate at all - MD aware

## 2017-01-10 NOTE — ED Triage Notes (Signed)
Pt arrived via ems for c/o left hip pain - she is noted to have a dry cough that she reports having the last few days with a runny nose but she is not taking anything for it - she denies any fall or injury that could be causing the left hip pain

## 2017-01-10 NOTE — ED Notes (Signed)
Called [facility] to see if they had transportation for pt back to the nursing home - they stated they did not and that EMS would have to be called - report given to nursing facility

## 2017-01-30 ENCOUNTER — Encounter: Payer: Self-pay | Admitting: Emergency Medicine

## 2017-01-30 ENCOUNTER — Emergency Department: Payer: Medicare Other

## 2017-01-30 ENCOUNTER — Emergency Department
Admission: EM | Admit: 2017-01-30 | Discharge: 2017-01-30 | Disposition: A | Discharge status: Home / Self Care | Payer: Medicare Other | Attending: Student in an Organized Health Care Education/Training Program | Admitting: Student in an Organized Health Care Education/Training Program

## 2017-01-30 DIAGNOSIS — R05 Cough: Secondary | ICD-10-CM | POA: Insufficient documentation

## 2017-01-30 DIAGNOSIS — F0390 Unspecified dementia without behavioral disturbance: Secondary | ICD-10-CM | POA: Insufficient documentation

## 2017-01-30 DIAGNOSIS — N183 Chronic kidney disease, stage 3 (moderate): Secondary | ICD-10-CM | POA: Diagnosis not present

## 2017-01-30 DIAGNOSIS — I129 Hypertensive chronic kidney disease with stage 1 through stage 4 chronic kidney disease, or unspecified chronic kidney disease: Secondary | ICD-10-CM | POA: Diagnosis not present

## 2017-01-30 DIAGNOSIS — R293 Abnormal posture: Secondary | ICD-10-CM | POA: Diagnosis not present

## 2017-01-30 DIAGNOSIS — Z7982 Long term (current) use of aspirin: Secondary | ICD-10-CM | POA: Diagnosis not present

## 2017-01-30 DIAGNOSIS — Z79899 Other long term (current) drug therapy: Secondary | ICD-10-CM | POA: Diagnosis not present

## 2017-01-30 DIAGNOSIS — R059 Cough, unspecified: Secondary | ICD-10-CM

## 2017-01-30 DIAGNOSIS — G20 Parkinson's disease: Secondary | ICD-10-CM | POA: Diagnosis not present

## 2017-01-30 LAB — CBC WITH DIFFERENTIAL/PLATELET
BASOS PCT: 1 %
Basophils Absolute: 0 10*3/uL (ref 0–0.1)
EOS ABS: 0.1 10*3/uL (ref 0–0.7)
Eosinophils Relative: 2 %
HEMATOCRIT: 32.1 % — AB (ref 35.0–47.0)
Hemoglobin: 10.5 g/dL — ABNORMAL LOW (ref 12.0–16.0)
Lymphocytes Relative: 17 %
Lymphs Abs: 1.1 10*3/uL (ref 1.0–3.6)
MCH: 28.7 pg (ref 26.0–34.0)
MCHC: 32.8 g/dL (ref 32.0–36.0)
MCV: 87.7 fL (ref 80.0–100.0)
MONO ABS: 0.5 10*3/uL (ref 0.2–0.9)
MONOS PCT: 8 %
Neutro Abs: 4.9 10*3/uL (ref 1.4–6.5)
Neutrophils Relative %: 72 %
Platelets: 166 10*3/uL (ref 150–440)
RBC: 3.67 MIL/uL — ABNORMAL LOW (ref 3.80–5.20)
RDW: 15.2 % — AB (ref 11.5–14.5)
WBC: 6.7 10*3/uL (ref 3.6–11.0)

## 2017-01-30 LAB — BASIC METABOLIC PANEL
Anion gap: 7 (ref 5–15)
BUN: 28 mg/dL — ABNORMAL HIGH (ref 6–20)
CALCIUM: 8.8 mg/dL — AB (ref 8.9–10.3)
CO2: 26 mmol/L (ref 22–32)
CREATININE: 1.82 mg/dL — AB (ref 0.44–1.00)
Chloride: 108 mmol/L (ref 101–111)
GFR calc Af Amer: 29 mL/min — ABNORMAL LOW (ref >60)
GFR calc non Af Amer: 25 mL/min — ABNORMAL LOW (ref >60)
Glucose, Bld: 95 mg/dL (ref 65–99)
Potassium: 4.2 mmol/L (ref 3.5–5.1)
SODIUM: 141 mmol/L (ref 135–145)

## 2017-01-30 LAB — URINALYSIS, COMPLETE (UACMP) WITH MICROSCOPIC
BACTERIA UA: NONE SEEN
Bilirubin Urine: NEGATIVE
Glucose, UA: NEGATIVE mg/dL
HGB URINE DIPSTICK: NEGATIVE
Ketones, ur: 5 mg/dL — AB
Leukocytes, UA: NEGATIVE
NITRITE: NEGATIVE
Protein, ur: NEGATIVE mg/dL
SPECIFIC GRAVITY, URINE: 1.015 (ref 1.005–1.030)
pH: 5 (ref 5.0–8.0)

## 2017-01-30 NOTE — ED Triage Notes (Signed)
Pt arrived via EMS from [facility] for reports leaning per staff and  of cough for two weeks per pt.. Pt has history of CVA with right side weakness. EMS reports 110/52, CBG 154, 95% RA. Pt does wear 2L O2 [place] at home. Pt offers no complaints other than cough on arrival.

## 2017-01-30 NOTE — ED Provider Notes (Signed)
[facility] Emergency Department Provider Note    First MD Initiated Contact with Patient 01/30/17 1259     (approximate)  I have reviewed the triage vital signs and the nursing notes.   HISTORY  Chief Complaint Cough  Level V Caveat:  Dementia  HPI Stacey Baker is a 83 y.o. female visiting from [facility] assisted[facility] due to concern for leaning to the right side per staff. Patient states that she does that sometimes. Does have a history of TIA but did affect the right side apparently. Patient is primary complaining of a cough that is been ongoing for 2 weeks. No fevers. Denies any chest pain. Denies any numbness or tingling. When asked patient consent set herself up. No headaches. States she has a history of Parkinson's that causes her to be weak on the right side. He wears 2 L nasal cannula at home chronically.   Past Medical History:  Diagnosis Date  . Apraxia following cerebral infarction   . CKD (chronic kidney disease), stage III    a. With acute worsening/AKI noted on several admissions.  . Dementia   . Dementia without behavioral disturbance   . Depression   . Encephalopathy   . Hyperkalemia    a. 08/2015  . Hyperlipidemia   . Hyperlipidemia   . Hypertension   . Iron deficiency anemia   . Major depressive disorder   . Muscle weakness   . Parkinson disease (HCC)   . Parkinson's disease (HCC)   . Pericardial effusion    a. 06/2016 Echo: Ef 60-65%, no rwma, Gr1 DD, mild MR, mildly dil LA, PASP , mod circumferential pericardial effusion - no hemodynamic compromise.  . Sinus bradycardia    a. 08/2015 in setting of beta blocker and hyperkalemia.  . Stroke (HCC)   . TIA (transient ischemic attack)    a. 08/2015 - essentially neg w/u for stroke.  Marland Kitchen UTI (urinary tract infection)    Family History  Problem Relation Age of Onset  . Heart disease Mother   . Hypertension Other    Past Surgical History:  Procedure Laterality Date  .  ABDOMINAL HYSTERECTOMY    . APPENDECTOMY    . CHOLECYSTECTOMY     Patient Active Problem List   Diagnosis Date Noted  . Hypoxia   . Atrial tachycardia (HCC)   . Generalized weakness 07/05/2016  . Thrombocytopenia (HCC) 07/05/2016  . UTI (urinary tract infection) 07/05/2016  . Acute respiratory failure (HCC) 07/05/2016  . Pleural effusion 07/05/2016  . CKD (chronic kidney disease), stage III   . Pericardial effusion   . Sinus bradycardia   . Acute renal failure (ARF) (HCC) 12/31/2015  . Chronic renal insufficiency 09/15/2015  . Bradycardia 09/15/2015  . Cellulitis 09/13/2015  . Parkinson disease (HCC) 09/13/2015  . HTN (hypertension) 09/13/2015  . HLD (hyperlipidemia) 09/13/2015  . Depression 09/13/2015  . Dementia 09/13/2015  . Hyperkalemia 09/13/2015  . TIA (transient ischemic attack) 08/27/2015      Prior to Admission medications   Medication Sig Start Date End Date Taking? Authorizing Provider  acetaminophen (TYLENOL) 325 MG tablet Take 650 mg by mouth every 6 (six) hours as needed.    Historical Provider, MD  acidophilus (RISAQUAD) CAPS capsule Take 1 capsule by mouth 2 (two) times daily.    Historical Provider, MD  amLODipine (NORVASC) 10 MG tablet Take 1 tablet (10 mg total) by mouth daily. 09/15/15   Katharina Caper, MD  aspirin EC 325 MG tablet Take 1 tablet (325 mg  total) by mouth daily. 09/04/16 09/04/17  Jene Every, MD  atorvastatin (LIPITOR) 10 MG tablet Take 10 mg by mouth at bedtime.    Historical Provider, MD  carbidopa-levodopa (SINEMET IR) 25-100 MG tablet Take 1 tablet by mouth 3 (three) times daily.    Historical Provider, MD  cyclobenzaprine (FLEXERIL) 10 MG tablet Take 10 mg by mouth 3 (three) times daily as needed for muscle spasms.    Historical Provider, MD  diclofenac sodium (VOLTAREN) 1 % GEL Apply 2 g topically 2 (two) times daily. Pt applies to left knee.    Historical Provider, MD  donepezil (ARICEPT) 10 MG tablet Take 10 mg by mouth daily.  12/22/16   Historical Provider, MD  escitalopram (LEXAPRO) 10 MG tablet Take 10 mg by mouth daily.    Historical Provider, MD  ferrous sulfate 325 (65 FE) MG tablet Take 325 mg by mouth 2 (two) times daily.    Historical Provider, MD  levETIRAcetam (KEPPRA) 250 MG tablet Take 250 mg by mouth daily.     Historical Provider, MD  loperamide (IMODIUM) 2 MG capsule Take 2 mg by mouth as needed for diarrhea or loose stools.    Historical Provider, MD  mirtazapine (REMERON) 15 MG tablet Take 15 mg by mouth at bedtime.    Historical Provider, MD  Multiple Vitamins-Minerals (PRESERVISION AREDS 2 PO) Take 2 capsules by mouth daily.    Historical Provider, MD  polyethylene glycol (MIRALAX / GLYCOLAX) packet Take 17 g by mouth daily as needed for mild constipation, moderate constipation or severe constipation.    Historical Provider, MD  QUEtiapine (SEROQUEL) 25 MG tablet Take 25 mg by mouth at bedtime.    Historical Provider, MD  Soft Lens Products (EQ SALINE SOLUTION/SENSITIVE) SOLN Apply 1 application topically every other day. Apply to neck topically once every other day for wound until healed.    Historical Provider, MD  vitamin B-12 (CYANOCOBALAMIN) 1000 MCG tablet Take 1,000 mcg by mouth daily.    Historical Provider, MD    Allergies Penicillins and Plavix [clopidogrel bisulfate]    Social History Social History  Substance Use Topics  . Smoking status: Never Smoker  . Smokeless tobacco: Never Used  . Alcohol use No    Review of Systems Patient denies headaches, rhinorrhea, blurry vision, numbness, shortness of breath, chest pain, edema, cough, abdominal pain, nausea, vomiting, diarrhea, dysuria, fevers, rashes or hallucinations unless otherwise stated above in HPI. ____________________________________________   PHYSICAL EXAM:  VITAL SIGNS: Vitals:   01/30/17 1301  BP: (!) 114/56  Pulse: (!) 59  Resp: 19  Temp: 98.2 F (36.8 C)    Constitutional: Alert  Well appearing and in no  acute distress. Eyes: Conjunctivae are normal. PERRL. EOMI. Head: Atraumatic. Nose: No congestion/rhinnorhea. Mouth/Throat: Mucous membranes are moist.  Oropharynx non-erythematous. Neck: No stridor. Painless ROM. No cervical spine tenderness to palpation Hematological/Lymphatic/Immunilogical: No cervical lymphadenopathy. Cardiovascular: Normal rate, regular rhythm. Grossly normal heart sounds.  Good peripheral circulation. Respiratory: Normal respiratory effort.  No retractions. Lungs CTAB. Gastrointestinal: Soft and nontender. No distention. No abdominal bruits. No CVA tenderness. Musculoskeletal: No lower extremity tenderness nor edema.  No joint effusions. Neurologic:  Normal speech and language. No gross focal neurologic deficits are appreciated. No facial droop.  CNI, no drift. Skin:  Skin is warm, dry and intact. No rash noted. Psychiatric: Mood and affect are normal. Speech and behavior are normal.  ____________________________________________   LABS (all labs ordered are listed, but only abnormal results are displayed)  Results  for orders placed or performed during the hospital encounter of 01/30/17 (from the past 24 hour(s))  CBC with Differential/Platelet     Status: Abnormal   Collection Time: 01/30/17  1:16 PM  Result Value Ref Range   WBC 6.7 3.6 - 11.0 K/uL   RBC 3.67 (L) 3.80 - 5.20 MIL/uL   Hemoglobin 10.5 (L) 12.0 - 16.0 g/dL   HCT 08.6 (L) 57.8 - 46.9 %   MCV 87.7 80.0 - 100.0 fL   MCH 28.7 26.0 - 34.0 pg   MCHC 32.8 32.0 - 36.0 g/dL   RDW 62.9 (H) 52.8 - 41.3 %   Platelets 166 150 - 440 K/uL   Neutrophils Relative % 72 %   Neutro Abs 4.9 1.4 - 6.5 K/uL   Lymphocytes Relative 17 %   Lymphs Abs 1.1 1.0 - 3.6 K/uL   Monocytes Relative 8 %   Monocytes Absolute 0.5 0.2 - 0.9 K/uL   Eosinophils Relative 2 %   Eosinophils Absolute 0.1 0 - 0.7 K/uL   Basophils Relative 1 %   Basophils Absolute 0.0 0 - 0.1 K/uL  Basic metabolic panel     Status: Abnormal    Collection Time: 01/30/17  1:16 PM  Result Value Ref Range   Sodium 141 135 - 145 mmol/L   Potassium 4.2 3.5 - 5.1 mmol/L   Chloride 108 101 - 111 mmol/L   CO2 26 22 - 32 mmol/L   Glucose, Bld 95 65 - 99 mg/dL   BUN 28 (H) 6 - 20 mg/dL   Creatinine, Ser 2.44 (H) 0.44 - 1.00 mg/dL   Calcium 8.8 (L) 8.9 - 10.3 mg/dL   GFR calc non Af Amer 25 (L) >60 mL/min   GFR calc Af Amer 29 (L) >60 mL/min   Anion gap 7 5 - 15  Urinalysis, Complete w Microscopic     Status: Abnormal   Collection Time: 01/30/17  4:00 PM  Result Value Ref Range   Color, Urine YELLOW (A) YELLOW   APPearance CLEAR (A) CLEAR   Specific Gravity, Urine 1.015 1.005 - 1.030   pH 5.0 5.0 - 8.0   Glucose, UA NEGATIVE NEGATIVE mg/dL   Hgb urine dipstick NEGATIVE NEGATIVE   Bilirubin Urine NEGATIVE NEGATIVE   Ketones, ur 5 (A) NEGATIVE mg/dL   Protein, ur NEGATIVE NEGATIVE mg/dL   Nitrite NEGATIVE NEGATIVE   Leukocytes, UA NEGATIVE NEGATIVE   RBC / HPF 0-5 0 - 5 RBC/hpf   WBC, UA 0-5 0 - 5 WBC/hpf   Bacteria, UA NONE SEEN NONE SEEN   Squamous Epithelial / LPF 0-5 (A) NONE SEEN   Hyaline Casts, UA PRESENT    ____________________________________________  EKG My review and personal interpretation at Time: 12:57   Indication: weakness  Rate: 65  Rhythm: sinus Axis: normal Other: poor r wave progression, no st elevations ____________________________________________  RADIOLOGY  I personally reviewed all radiographic images ordered to evaluate for the above acute complaints and reviewed radiology reports and findings.  These findings were personally discussed with the patient.  Please see medical record for radiology report. ____________________________________________   PROCEDURES  Procedure(s) performed:  Procedures    Critical Care performed: no ____________________________________________   INITIAL IMPRESSION / ASSESSMENT AND PLAN / ED COURSE  Pertinent labs & imaging results that were available during my  care of the patient were reviewed by me and considered in my medical decision making (see chart for details).  DDX: cva, pna, uti, electrolyte abn  Ashli Bailey is  a 83 y.o. who presents to the ED with History of dementia concern for abnormal position and leaning to the side. Patient with Parkinson's. History limited due to underlying dementia but the patient appears well fitted no acute distress. Does wear oxygen as needed at home. Exam as above. CT imaging to evaluate for CVA shows no acute abnormality. Electrolytes and blood work is otherwise reassuring. There is no evidence of UTI. Do not feel patient is stable for discharge back to facility.      ____________________________________________   FINAL CLINICAL IMPRESSION(S) / ED DIAGNOSES  Final diagnoses:  Abnormal body position  Cough      NEW MEDICATIONS STARTED DURING THIS VISIT:  New Prescriptions   No medications on file     Note:  This document was prepared using Dragon voice recognition software and may include unintentional dictation errors.    Willy Eddy, MD 01/30/17 2127

## 2017-01-30 NOTE — ED Notes (Signed)
Patient transported to X-ray 

## 2017-01-31 ENCOUNTER — Emergency Department
Admission: EM | Admit: 2017-01-31 | Discharge: 2017-01-31 | Disposition: A | Discharge status: Home / Self Care | Payer: Medicare Other | Attending: Emergency Medicine | Admitting: Emergency Medicine

## 2017-01-31 ENCOUNTER — Encounter: Payer: Self-pay | Admitting: Emergency Medicine

## 2017-01-31 ENCOUNTER — Emergency Department: Payer: Medicare Other

## 2017-01-31 DIAGNOSIS — N183 Chronic kidney disease, stage 3 (moderate): Secondary | ICD-10-CM | POA: Diagnosis not present

## 2017-01-31 DIAGNOSIS — R531 Weakness: Secondary | ICD-10-CM

## 2017-01-31 DIAGNOSIS — E860 Dehydration: Secondary | ICD-10-CM | POA: Insufficient documentation

## 2017-01-31 DIAGNOSIS — Z79899 Other long term (current) drug therapy: Secondary | ICD-10-CM | POA: Diagnosis not present

## 2017-01-31 DIAGNOSIS — I129 Hypertensive chronic kidney disease with stage 1 through stage 4 chronic kidney disease, or unspecified chronic kidney disease: Secondary | ICD-10-CM | POA: Diagnosis not present

## 2017-01-31 LAB — CBC
HCT: 31.3 % — ABNORMAL LOW (ref 35.0–47.0)
HEMOGLOBIN: 10.5 g/dL — AB (ref 12.0–16.0)
MCH: 29.1 pg (ref 26.0–34.0)
MCHC: 33.4 g/dL (ref 32.0–36.0)
MCV: 87 fL (ref 80.0–100.0)
PLATELETS: 145 10*3/uL — AB (ref 150–440)
RBC: 3.6 MIL/uL — ABNORMAL LOW (ref 3.80–5.20)
RDW: 14.7 % — AB (ref 11.5–14.5)
WBC: 6.4 10*3/uL (ref 3.6–11.0)

## 2017-01-31 LAB — BASIC METABOLIC PANEL
ANION GAP: 6 (ref 5–15)
BUN: 27 mg/dL — AB (ref 6–20)
CALCIUM: 8.6 mg/dL — AB (ref 8.9–10.3)
CO2: 26 mmol/L (ref 22–32)
CREATININE: 1.82 mg/dL — AB (ref 0.44–1.00)
Chloride: 109 mmol/L (ref 101–111)
GFR calc Af Amer: 29 mL/min — ABNORMAL LOW (ref >60)
GFR, EST NON AFRICAN AMERICAN: 25 mL/min — AB (ref >60)
GLUCOSE: 87 mg/dL (ref 65–99)
Potassium: 4.1 mmol/L (ref 3.5–5.1)
Sodium: 141 mmol/L (ref 135–145)

## 2017-01-31 LAB — TROPONIN I

## 2017-01-31 MED ORDER — SODIUM CHLORIDE 0.9 % IV BOLUS (SEPSIS)
1000.0000 mL | Freq: Once | INTRAVENOUS | Status: AC
  Administered 2017-01-31: 1000 mL via INTRAVENOUS

## 2017-01-31 NOTE — Discharge Instructions (Signed)
You were found to be slightly dehydration and received IV fluids. Your blood work and MRI did not show any evidence of infection, brain bleed, stroke. Please follow-up with your doctor in 1-2 days for reevaluation. Increase your oral hydration in the next few days. Return to the emergency room if you have fever, chest pain, shortness of breath, abdominal pain, or if any new symptoms develop.

## 2017-01-31 NOTE — ED Triage Notes (Signed)
Patient from [facility] with staff report of "right sided lean" - -stroke like symptoms.   Staff reports that she was seen in this facility yesterday for evaluation of same however, "she was just checked for a cold".   Ems reports negative stroke screen enroute  Old cva (R) noted

## 2017-01-31 NOTE — ED Provider Notes (Signed)
[facility] Emergency Department Provider Note  ____________________________________________  Time seen: Approximately 9:29 AM  I have reviewed the triage vital signs and the nursing notes.   HISTORY  Chief Complaint Neurologic Problem  Level 5 caveat:  Portions of the history and physical were unable to be obtained due to dementia   HPI Stacey Baker is a 83 y.o. female with a history of TIA, seizures, sinus bradycardia, Parkinson's disease, dementia, hypertension, hyperlipidemia, chronic kidney disease who presents from her skilled nursing facility for concerns of possible stroke. I spoke with the nurse at [facility] who tells me that for the last 2-3 days patient has had trouble seeing, has been leaning to the right on her wheelchair, and has not been able to help with transfer to and from the wheelchair which she can usually do a baseline. She is also been slower to respond to questions. She has been eating and drinking normally. No fever, vomiting or diarrhea. Patient tells me that she has been very tired and all she wants to do is sleep. She tells me nothing hurts. She does tell me that she leans to the right but that is going on for a long time because of her Parkinson's disease.  Past Medical History:  Diagnosis Date  . Apraxia following cerebral infarction   . CKD (chronic kidney disease), stage III    a. With acute worsening/AKI noted on several admissions.  . Dementia   . Dementia without behavioral disturbance   . Depression   . Encephalopathy   . Hyperkalemia    a. 08/2015  . Hyperlipidemia   . Hyperlipidemia   . Hypertension   . Iron deficiency anemia   . Major depressive disorder   . Muscle weakness   . Parkinson disease (HCC)   . Parkinson's disease (HCC)   . Pericardial effusion    a. 06/2016 Echo: Ef 60-65%, no rwma, Gr1 DD, mild MR, mildly dil LA, PASP , mod circumferential pericardial effusion - no hemodynamic compromise.  .  Sinus bradycardia    a. 08/2015 in setting of beta blocker and hyperkalemia.  . Stroke (HCC)   . TIA (transient ischemic attack)    a. 08/2015 - essentially neg w/u for stroke.  Marland Kitchen UTI (urinary tract infection)     Patient Active Problem List   Diagnosis Date Noted  . Hypoxia   . Atrial tachycardia (HCC)   . Generalized weakness 07/05/2016  . Thrombocytopenia (HCC) 07/05/2016  . UTI (urinary tract infection) 07/05/2016  . Acute respiratory failure (HCC) 07/05/2016  . Pleural effusion 07/05/2016  . CKD (chronic kidney disease), stage III   . Pericardial effusion   . Sinus bradycardia   . Acute renal failure (ARF) (HCC) 12/31/2015  . Chronic renal insufficiency 09/15/2015  . Bradycardia 09/15/2015  . Cellulitis 09/13/2015  . Parkinson disease (HCC) 09/13/2015  . HTN (hypertension) 09/13/2015  . HLD (hyperlipidemia) 09/13/2015  . Depression 09/13/2015  . Dementia 09/13/2015  . Hyperkalemia 09/13/2015  . TIA (transient ischemic attack) 08/27/2015    Past Surgical History:  Procedure Laterality Date  . ABDOMINAL HYSTERECTOMY    . APPENDECTOMY    . CHOLECYSTECTOMY      Prior to Admission medications   Medication Sig Start Date End Date Taking? Authorizing Provider  acetaminophen (TYLENOL) 325 MG tablet Take 650 mg by mouth every 6 (six) hours as needed.   Yes Historical Provider, MD  acidophilus (RISAQUAD) CAPS capsule Take 1 capsule by mouth 2 (two) times daily.  Yes Historical Provider, MD  amLODipine (NORVASC) 10 MG tablet Take 1 tablet (10 mg total) by mouth daily. 09/15/15  Yes Katharina Caper, MD  aspirin EC 325 MG tablet Take 1 tablet (325 mg total) by mouth daily. 09/04/16 09/04/17 Yes Jene Every, MD  atorvastatin (LIPITOR) 10 MG tablet Take 10 mg by mouth at bedtime.   Yes Historical Provider, MD  azithromycin (ZITHROMAX) 250 MG tablet Take 250-500 mg by mouth daily. Take 500 mg on day one then take 250 mg once daily for 4 days. 01/28/17 02/01/17 Yes Historical  Provider, MD  carbidopa-levodopa (SINEMET IR) 25-100 MG tablet Take 1 tablet by mouth 3 (three) times daily.   Yes Historical Provider, MD  cyclobenzaprine (FLEXERIL) 10 MG tablet Take 10 mg by mouth 3 (three) times daily as needed for muscle spasms.   Yes Historical Provider, MD  diclofenac sodium (VOLTAREN) 1 % GEL Apply 2 g topically 2 (two) times daily. Pt applies to left knee.   Yes Historical Provider, MD  donepezil (ARICEPT) 10 MG tablet Take 10 mg by mouth daily. 12/22/16  Yes Historical Provider, MD  escitalopram (LEXAPRO) 10 MG tablet Take 10 mg by mouth daily.   Yes Historical Provider, MD  ferrous sulfate 325 (65 FE) MG tablet Take 325 mg by mouth 2 (two) times daily.   Yes Historical Provider, MD  levETIRAcetam (KEPPRA) 250 MG tablet Take 250 mg by mouth daily.    Yes Historical Provider, MD  loperamide (IMODIUM) 2 MG capsule Take 2 mg by mouth as needed for diarrhea or loose stools.   Yes Historical Provider, MD  mirtazapine (REMERON) 15 MG tablet Take 15 mg by mouth at bedtime.   Yes Historical Provider, MD  Multiple Vitamins-Minerals (PRESERVISION AREDS 2 PO) Take 2 capsules by mouth daily.   Yes Historical Provider, MD  polyethylene glycol (MIRALAX / GLYCOLAX) packet Take 17 g by mouth daily as needed for mild constipation, moderate constipation or severe constipation.   Yes Historical Provider, MD  QUEtiapine (SEROQUEL) 25 MG tablet Take 25 mg by mouth at bedtime.   Yes Historical Provider, MD  Soft Lens Products (EQ SALINE SOLUTION/SENSITIVE) SOLN Apply 1 application topically every other day. Apply to neck topically once every other day for wound until healed.   Yes Historical Provider, MD  vitamin B-12 (CYANOCOBALAMIN) 1000 MCG tablet Take 1,000 mcg by mouth daily.   Yes Historical Provider, MD    Allergies Penicillins and Plavix [clopidogrel bisulfate]  Family History  Problem Relation Age of Onset  . Heart disease Mother   . Hypertension Other     Social History Social  History  Substance Use Topics  . Smoking status: Never Smoker  . Smokeless tobacco: Never Used  . Alcohol use No    Review of Systems  Constitutional: Negative for fever. + fatigue Eyes: Negative for visual changes. ENT: Negative for sore throat. Neck: No neck pain  Cardiovascular: Negative for chest pain. Respiratory: Negative for shortness of breath. Gastrointestinal: Negative for abdominal pain, vomiting or diarrhea. Genitourinary: Negative for dysuria. Musculoskeletal: Negative for back pain. Skin: Negative for rash. Neurological: Negative for headaches, weakness or numbness. + leaning to the right Psych: No SI or HI  ____________________________________________   PHYSICAL EXAM:  VITAL SIGNS: ED Triage Vitals  Enc Vitals Group     BP 01/31/17 0857 108/65     Pulse Rate 01/31/17 0857 60     Resp 01/31/17 0857 16     Temp 01/31/17 0857 98.3 F (36.8 C)  Temp Source 01/31/17 0857 Oral     SpO2 01/31/17 0857 99 %     Weight 01/31/17 0830 140 lb (63.5 kg)     Height 01/31/17 0830  (1.6 m)     Head Circumference --      Peak Flow --      Pain Score 01/31/17 0830 0     Pain Loc --      Pain Edu? --      Excl. in GC? --     Constitutional: Alert and oriented x 3, seems sleepy but easily arousabel.  HEENT:      Head: Normocephalic and atraumatic.         Eyes: Conjunctivae are normal. Sclera is non-icteric. EOMI. PERRL      Mouth/Throat: Mucous membranes are dry.       Neck: Supple with no signs of meningismus. Cardiovascular: Regular rate and rhythm. No murmurs, gallops, or rubs. 2+ symmetrical distal pulses are present in all extremities. No JVD. Respiratory: Normal respiratory effort. Lungs are clear to auscultation bilaterally. No wheezes, crackles, or rhonchi.  Gastrointestinal: Soft, non tender, and non distended with positive bowel sounds. No rebound or guarding. Musculoskeletal: Nontender with normal range of motion in all extremities. No edema,  cyanosis, or erythema of extremities. Neurologic: Normal speech and language. Face is symmetric. Weakness of LLE 2/5, all other 5/5, no dysmetria or pronator drift. Skin: Skin is warm, dry and intact. No rash noted. Psychiatric: Mood and affect are normal. Speech and behavior are normal.  ____________________________________________   LABS (all labs ordered are listed, but only abnormal results are displayed)  Labs Reviewed  BASIC METABOLIC PANEL - Abnormal; Notable for the following:       Result Value   BUN 27 (*)    Creatinine, Ser 1.82 (*)    Calcium 8.6 (*)    GFR calc non Af Amer 25 (*)    GFR calc Af Amer 29 (*)    All other components within normal limits  CBC - Abnormal; Notable for the following:    RBC 3.60 (*)    Hemoglobin 10.5 (*)    HCT 31.3 (*)    RDW 14.7 (*)    Platelets 145 (*)    All other components within normal limits  TROPONIN I  URINALYSIS, COMPLETE (UACMP) WITH MICROSCOPIC  LEVETIRACETAM LEVEL  CBG MONITORING, ED   ____________________________________________  EKG  ED ECG REPORT I, Nita Sickle, the attending physician, personally viewed and interpreted this ECG.  Normal sinus rhythm, rate of 58, normal intervals, normal axis, no ST elevations or depressions, flattening T waves diffusely ____________________________________________  RADIOLOGY  MRI brain: negative ____________________________________________   PROCEDURES  Procedure(s) performed: None Procedures Critical Care performed:  None ____________________________________________   INITIAL IMPRESSION / ASSESSMENT AND PLAN / ED COURSE  83 y.o. female with a history of TIA, seizures, sinus bradycardia, Parkinson's disease, dementia, hypertension, hyperlipidemia, chronic kidney disease who presents from her skilled nursing facility for changes in mental status, leaning to the R, slow to answer questions. Patient was seen here yesterday for similar complaints with a negative  head CT, UA with no evidence of UTI, and blood work were no acute findings. Patient is very sleepy however easily arousable, she is unable to lift her left lower extremity which per her is chronic, she has intact strength in all other 3 extremities, no facial droop, no slurred speech. Unclear etiology of patient's symptoms at this time. According to patient they're all old. We'll  do an MRI, we'll repeat basic blood work. Will give IVF due to mildly elevated creatinine yesterday and the fact that patient looks dry on exam.   Clinical Course as of Jan 31 1326  Sat Jan 31, 2017  1317 MRI with no evidence of stroke. Labs with no acute findings. Patient given IVF and feels improved. Tolerating PO. Will discharge back to SNF.  [CV]    Clinical Course User Index [CV] Nita Sickle, MD    Pertinent labs & imaging results that were available during my care of the patient were reviewed by me and considered in my medical decision making (see chart for details).    ____________________________________________   FINAL CLINICAL IMPRESSION(S) / ED DIAGNOSES  Final diagnoses:  Generalized weakness  Dehydration      NEW MEDICATIONS STARTED DURING THIS VISIT:  New Prescriptions   No medications on file     Note:  This document was prepared using Dragon voice recognition software and may include unintentional dictation errors.    Nita Sickle, MD 01/31/17 1327

## 2017-02-02 LAB — LEVETIRACETAM LEVEL: Levetiracetam Lvl: 10.8 ug/mL (ref 10.0–40.0)

## 2017-02-26 ENCOUNTER — Emergency Department
Admission: EM | Admit: 2017-02-26 | Discharge: 2017-02-26 | Disposition: A | Discharge status: Home / Self Care | Payer: Medicare Other | Source: Home / Self Care | Attending: Emergency Medicine | Admitting: Emergency Medicine

## 2017-02-26 ENCOUNTER — Emergency Department: Payer: Medicare Other

## 2017-02-26 ENCOUNTER — Encounter: Payer: Self-pay | Admitting: Emergency Medicine

## 2017-02-26 ENCOUNTER — Inpatient Hospital Stay
Admission: EM | Admit: 2017-02-26 | Discharge: 2017-03-09 | DRG: 682 | Disposition: A | Discharge status: Nursing Home (Includes ICF) | Payer: Medicare Other | Attending: Internal Medicine | Admitting: Internal Medicine

## 2017-02-26 DIAGNOSIS — S0083XA Contusion of other part of head, initial encounter: Secondary | ICD-10-CM

## 2017-02-26 DIAGNOSIS — I5033 Acute on chronic diastolic (congestive) heart failure: Secondary | ICD-10-CM | POA: Diagnosis present

## 2017-02-26 DIAGNOSIS — I130 Hypertensive heart and chronic kidney disease with heart failure and stage 1 through stage 4 chronic kidney disease, or unspecified chronic kidney disease: Secondary | ICD-10-CM | POA: Diagnosis present

## 2017-02-26 DIAGNOSIS — I129 Hypertensive chronic kidney disease with stage 1 through stage 4 chronic kidney disease, or unspecified chronic kidney disease: Secondary | ICD-10-CM

## 2017-02-26 DIAGNOSIS — E875 Hyperkalemia: Secondary | ICD-10-CM | POA: Diagnosis present

## 2017-02-26 DIAGNOSIS — Z7189 Other specified counseling: Secondary | ICD-10-CM

## 2017-02-26 DIAGNOSIS — Z515 Encounter for palliative care: Secondary | ICD-10-CM

## 2017-02-26 DIAGNOSIS — W2203XA Walked into furniture, initial encounter: Secondary | ICD-10-CM

## 2017-02-26 DIAGNOSIS — Y999 Unspecified external cause status: Secondary | ICD-10-CM | POA: Insufficient documentation

## 2017-02-26 DIAGNOSIS — N281 Cyst of kidney, acquired: Secondary | ICD-10-CM | POA: Diagnosis present

## 2017-02-26 DIAGNOSIS — N179 Acute kidney failure, unspecified: Secondary | ICD-10-CM | POA: Diagnosis not present

## 2017-02-26 DIAGNOSIS — R195 Other fecal abnormalities: Secondary | ICD-10-CM

## 2017-02-26 DIAGNOSIS — R4701 Aphasia: Secondary | ICD-10-CM | POA: Diagnosis present

## 2017-02-26 DIAGNOSIS — W19XXXA Unspecified fall, initial encounter: Secondary | ICD-10-CM

## 2017-02-26 DIAGNOSIS — I272 Pulmonary hypertension, unspecified: Secondary | ICD-10-CM | POA: Diagnosis present

## 2017-02-26 DIAGNOSIS — R062 Wheezing: Secondary | ICD-10-CM

## 2017-02-26 DIAGNOSIS — N184 Chronic kidney disease, stage 4 (severe): Secondary | ICD-10-CM | POA: Diagnosis present

## 2017-02-26 DIAGNOSIS — I2510 Atherosclerotic heart disease of native coronary artery without angina pectoris: Secondary | ICD-10-CM | POA: Diagnosis present

## 2017-02-26 DIAGNOSIS — R1312 Dysphagia, oropharyngeal phase: Secondary | ICD-10-CM | POA: Diagnosis present

## 2017-02-26 DIAGNOSIS — E785 Hyperlipidemia, unspecified: Secondary | ICD-10-CM | POA: Diagnosis present

## 2017-02-26 DIAGNOSIS — G20 Parkinson's disease: Secondary | ICD-10-CM | POA: Diagnosis present

## 2017-02-26 DIAGNOSIS — I248 Other forms of acute ischemic heart disease: Secondary | ICD-10-CM | POA: Diagnosis present

## 2017-02-26 DIAGNOSIS — Z8673 Personal history of transient ischemic attack (TIA), and cerebral infarction without residual deficits: Secondary | ICD-10-CM | POA: Insufficient documentation

## 2017-02-26 DIAGNOSIS — T148XXA Other injury of unspecified body region, initial encounter: Secondary | ICD-10-CM

## 2017-02-26 DIAGNOSIS — Z7982 Long term (current) use of aspirin: Secondary | ICD-10-CM

## 2017-02-26 DIAGNOSIS — Z66 Do not resuscitate: Secondary | ICD-10-CM | POA: Diagnosis present

## 2017-02-26 DIAGNOSIS — F329 Major depressive disorder, single episode, unspecified: Secondary | ICD-10-CM | POA: Diagnosis present

## 2017-02-26 DIAGNOSIS — F0390 Unspecified dementia without behavioral disturbance: Secondary | ICD-10-CM | POA: Diagnosis present

## 2017-02-26 DIAGNOSIS — J9811 Atelectasis: Secondary | ICD-10-CM | POA: Diagnosis present

## 2017-02-26 DIAGNOSIS — Y939 Activity, unspecified: Secondary | ICD-10-CM | POA: Insufficient documentation

## 2017-02-26 DIAGNOSIS — M542 Cervicalgia: Secondary | ICD-10-CM | POA: Insufficient documentation

## 2017-02-26 DIAGNOSIS — D500 Iron deficiency anemia secondary to blood loss (chronic): Secondary | ICD-10-CM | POA: Diagnosis present

## 2017-02-26 DIAGNOSIS — I360 Nonrheumatic tricuspid (valve) stenosis: Secondary | ICD-10-CM | POA: Diagnosis not present

## 2017-02-26 DIAGNOSIS — Y929 Unspecified place or not applicable: Secondary | ICD-10-CM

## 2017-02-26 DIAGNOSIS — R410 Disorientation, unspecified: Secondary | ICD-10-CM | POA: Diagnosis not present

## 2017-02-26 DIAGNOSIS — W1830XA Fall on same level, unspecified, initial encounter: Secondary | ICD-10-CM | POA: Diagnosis present

## 2017-02-26 DIAGNOSIS — I639 Cerebral infarction, unspecified: Secondary | ICD-10-CM

## 2017-02-26 DIAGNOSIS — I071 Rheumatic tricuspid insufficiency: Secondary | ICD-10-CM | POA: Diagnosis present

## 2017-02-26 DIAGNOSIS — E860 Dehydration: Secondary | ICD-10-CM | POA: Diagnosis present

## 2017-02-26 DIAGNOSIS — E872 Acidosis: Secondary | ICD-10-CM | POA: Diagnosis present

## 2017-02-26 DIAGNOSIS — Z79899 Other long term (current) drug therapy: Secondary | ICD-10-CM

## 2017-02-26 DIAGNOSIS — R7989 Other specified abnormal findings of blood chemistry: Secondary | ICD-10-CM | POA: Diagnosis not present

## 2017-02-26 DIAGNOSIS — N170 Acute kidney failure with tubular necrosis: Secondary | ICD-10-CM | POA: Diagnosis not present

## 2017-02-26 DIAGNOSIS — Z880 Allergy status to penicillin: Secondary | ICD-10-CM | POA: Diagnosis not present

## 2017-02-26 DIAGNOSIS — D649 Anemia, unspecified: Secondary | ICD-10-CM

## 2017-02-26 DIAGNOSIS — D631 Anemia in chronic kidney disease: Secondary | ICD-10-CM | POA: Diagnosis present

## 2017-02-26 DIAGNOSIS — G9341 Metabolic encephalopathy: Secondary | ICD-10-CM | POA: Diagnosis not present

## 2017-02-26 DIAGNOSIS — R531 Weakness: Secondary | ICD-10-CM

## 2017-02-26 DIAGNOSIS — N183 Chronic kidney disease, stage 3 (moderate): Secondary | ICD-10-CM

## 2017-02-26 DIAGNOSIS — M25551 Pain in right hip: Secondary | ICD-10-CM

## 2017-02-26 DIAGNOSIS — N189 Chronic kidney disease, unspecified: Secondary | ICD-10-CM

## 2017-02-26 DIAGNOSIS — D696 Thrombocytopenia, unspecified: Secondary | ICD-10-CM | POA: Diagnosis present

## 2017-02-26 DIAGNOSIS — R58 Hemorrhage, not elsewhere classified: Secondary | ICD-10-CM | POA: Diagnosis not present

## 2017-02-26 LAB — CBC WITH DIFFERENTIAL/PLATELET
BASOS ABS: 0.1 10*3/uL (ref 0–0.1)
BASOS PCT: 1 %
EOS PCT: 1 %
Eosinophils Absolute: 0 10*3/uL (ref 0–0.7)
HEMATOCRIT: 24.6 % — AB (ref 35.0–47.0)
Hemoglobin: 8.1 g/dL — ABNORMAL LOW (ref 12.0–16.0)
LYMPHS PCT: 12 %
Lymphs Abs: 0.7 10*3/uL — ABNORMAL LOW (ref 1.0–3.6)
MCH: 28.8 pg (ref 26.0–34.0)
MCHC: 32.8 g/dL (ref 32.0–36.0)
MCV: 88 fL (ref 80.0–100.0)
MONO ABS: 0.5 10*3/uL (ref 0.2–0.9)
MONOS PCT: 9 %
Neutro Abs: 4.6 10*3/uL (ref 1.4–6.5)
Neutrophils Relative %: 77 %
PLATELETS: 111 10*3/uL — AB (ref 150–440)
RBC: 2.79 MIL/uL — ABNORMAL LOW (ref 3.80–5.20)
RDW: 16.5 % — AB (ref 11.5–14.5)
WBC: 6 10*3/uL (ref 3.6–11.0)

## 2017-02-26 LAB — URINALYSIS, COMPLETE (UACMP) WITH MICROSCOPIC
BACTERIA UA: NONE SEEN
Bilirubin Urine: NEGATIVE
Glucose, UA: NEGATIVE mg/dL
HGB URINE DIPSTICK: NEGATIVE
KETONES UR: NEGATIVE mg/dL
LEUKOCYTES UA: NEGATIVE
Nitrite: NEGATIVE
PH: 5 (ref 5.0–8.0)
PROTEIN: NEGATIVE mg/dL
Specific Gravity, Urine: 1.014 (ref 1.005–1.030)

## 2017-02-26 LAB — BASIC METABOLIC PANEL
ANION GAP: 9 (ref 5–15)
BUN: 34 mg/dL — ABNORMAL HIGH (ref 6–20)
CO2: 22 mmol/L (ref 22–32)
Calcium: 8.4 mg/dL — ABNORMAL LOW (ref 8.9–10.3)
Chloride: 106 mmol/L (ref 101–111)
Creatinine, Ser: 3.17 mg/dL — ABNORMAL HIGH (ref 0.44–1.00)
GFR calc Af Amer: 15 mL/min — ABNORMAL LOW (ref >60)
GFR, EST NON AFRICAN AMERICAN: 13 mL/min — AB (ref >60)
Glucose, Bld: 89 mg/dL (ref 65–99)
Potassium: 5.4 mmol/L — ABNORMAL HIGH (ref 3.5–5.1)
Sodium: 137 mmol/L (ref 135–145)

## 2017-02-26 LAB — TROPONIN I: Troponin I: <0.03 ng/mL (ref <0.03)

## 2017-02-26 MED ORDER — ACETAMINOPHEN 650 MG RE SUPP: 650.0000 mg | Freq: Four times a day (QID) | RECTAL | Status: DC | PRN

## 2017-02-26 MED ORDER — FERROUS SULFATE 325 (65 FE) MG PO TABS
325.0000 mg | ORAL_TABLET | Freq: Two times a day (BID) | ORAL | Status: DC
  Administered 2017-02-27 – 2017-03-09 (×20): 325 mg via ORAL
  Filled 2017-02-26 (×21): qty 1

## 2017-02-26 MED ORDER — SODIUM CHLORIDE 0.9 % IV SOLN
INTRAVENOUS | Status: DC
  Administered 2017-02-26: via INTRAVENOUS

## 2017-02-26 MED ORDER — RISAQUAD PO CAPS
1.0000 | ORAL_CAPSULE | Freq: Two times a day (BID) | ORAL | Status: DC
  Administered 2017-02-27 – 2017-03-09 (×21): 1 via ORAL
  Filled 2017-02-26 (×21): qty 1

## 2017-02-26 MED ORDER — QUETIAPINE FUMARATE 25 MG PO TABS
25.0000 mg | ORAL_TABLET | Freq: Every day | ORAL | Status: DC
  Administered 2017-02-27 – 2017-03-03 (×5): 25 mg via ORAL
  Filled 2017-02-26 (×6): qty 1

## 2017-02-26 MED ORDER — DICLOFENAC SODIUM 1 % TD GEL
2.0000 g | Freq: Two times a day (BID) | TRANSDERMAL | Status: DC
  Administered 2017-02-27 – 2017-03-09 (×18): 2 g via TOPICAL
  Filled 2017-02-26 (×2): qty 100

## 2017-02-26 MED ORDER — ATORVASTATIN CALCIUM 10 MG PO TABS
10.0000 mg | ORAL_TABLET | Freq: Every day | ORAL | Status: DC
  Administered 2017-02-27 – 2017-03-08 (×10): 10 mg via ORAL
  Filled 2017-02-26 (×11): qty 1

## 2017-02-26 MED ORDER — VITAMIN B-12 1000 MCG PO TABS
1000.0000 ug | ORAL_TABLET | Freq: Every day | ORAL | Status: DC
  Administered 2017-02-27 – 2017-03-09 (×11): 1000 ug via ORAL
  Filled 2017-02-26 (×11): qty 1

## 2017-02-26 MED ORDER — CARBIDOPA-LEVODOPA 25-100 MG PO TABS
1.0000 | ORAL_TABLET | Freq: Three times a day (TID) | ORAL | Status: DC
  Administered 2017-02-27 – 2017-03-09 (×32): 1 via ORAL
  Filled 2017-02-26 (×34): qty 1

## 2017-02-26 MED ORDER — OCUVITE-LUTEIN PO CAPS
ORAL_CAPSULE | Freq: Every day | ORAL | Status: DC
  Administered 2017-02-27 – 2017-03-09 (×11): 1 via ORAL
  Filled 2017-02-26 (×11): qty 1

## 2017-02-26 MED ORDER — PANTOPRAZOLE SODIUM 40 MG PO TBEC
40.0000 mg | DELAYED_RELEASE_TABLET | Freq: Every day | ORAL | Status: DC
  Administered 2017-02-26 – 2017-03-09 (×12): 40 mg via ORAL
  Filled 2017-02-26 (×12): qty 1

## 2017-02-26 MED ORDER — ACETAMINOPHEN 500 MG PO TABS
1000.0000 mg | ORAL_TABLET | Freq: Once | ORAL | Status: AC
  Administered 2017-02-26: 1000 mg via ORAL
  Filled 2017-02-26: qty 2

## 2017-02-26 MED ORDER — MIRTAZAPINE 15 MG PO TABS
15.0000 mg | ORAL_TABLET | Freq: Every day | ORAL | Status: DC
  Administered 2017-02-27 – 2017-03-03 (×5): 15 mg via ORAL
  Filled 2017-02-26 (×5): qty 1

## 2017-02-26 MED ORDER — ONDANSETRON HCL 4 MG PO TABS: 4.0000 mg | ORAL_TABLET | Freq: Four times a day (QID) | ORAL | Status: DC | PRN

## 2017-02-26 MED ORDER — CYCLOBENZAPRINE HCL 10 MG PO TABS
10.0000 mg | ORAL_TABLET | Freq: Three times a day (TID) | ORAL | Status: DC | PRN
  Administered 2017-02-27: 10 mg via ORAL
  Filled 2017-02-26: qty 1

## 2017-02-26 MED ORDER — SODIUM CHLORIDE 0.9 % IV SOLN
Freq: Once | INTRAVENOUS | Status: AC
  Administered 2017-02-26: 20:00:00 via INTRAVENOUS

## 2017-02-26 MED ORDER — ONDANSETRON HCL 4 MG/2ML IJ SOLN: 4.0000 mg | Freq: Four times a day (QID) | INTRAMUSCULAR | Status: DC | PRN

## 2017-02-26 MED ORDER — SODIUM CHLORIDE 0.9 % IV BOLUS (SEPSIS)
500.0000 mL | Freq: Once | INTRAVENOUS | Status: AC
  Administered 2017-02-26: 500 mL via INTRAVENOUS

## 2017-02-26 MED ORDER — DONEPEZIL HCL 5 MG PO TABS
10.0000 mg | ORAL_TABLET | Freq: Every day | ORAL | Status: DC
  Administered 2017-02-27 – 2017-03-09 (×10): 10 mg via ORAL
  Filled 2017-02-26 (×11): qty 2

## 2017-02-26 MED ORDER — AMLODIPINE BESYLATE 10 MG PO TABS
10.0000 mg | ORAL_TABLET | Freq: Every day | ORAL | Status: DC
  Administered 2017-02-27 – 2017-03-09 (×11): 10 mg via ORAL
  Filled 2017-02-26 (×11): qty 1

## 2017-02-26 MED ORDER — ACETAMINOPHEN 325 MG PO TABS: 650.0000 mg | ORAL_TABLET | Freq: Four times a day (QID) | ORAL | Status: DC | PRN

## 2017-02-26 MED ORDER — ESCITALOPRAM OXALATE 10 MG PO TABS
10.0000 mg | ORAL_TABLET | Freq: Every day | ORAL | Status: DC
  Administered 2017-02-27 – 2017-03-09 (×10): 10 mg via ORAL
  Filled 2017-02-26 (×11): qty 1

## 2017-02-26 MED ORDER — SULFAMETHOXAZOLE-TRIMETHOPRIM 800-160 MG PO TABS
1.0000 | ORAL_TABLET | Freq: Two times a day (BID) | ORAL | Status: AC
  Administered 2017-02-27: 1 via ORAL
  Filled 2017-02-26: qty 1

## 2017-02-26 MED ORDER — LEVETIRACETAM 250 MG PO TABS
250.0000 mg | ORAL_TABLET | Freq: Every day | ORAL | Status: DC
  Administered 2017-02-27 – 2017-03-09 (×10): 250 mg via ORAL
  Filled 2017-02-26 (×11): qty 1

## 2017-02-26 MED ORDER — SODIUM POLYSTYRENE SULFONATE 15 GM/60ML PO SUSP
30.0000 g | Freq: Once | ORAL | Status: AC
  Administered 2017-02-26: 30 g via ORAL
  Filled 2017-02-26: qty 120

## 2017-02-26 NOTE — ED Notes (Signed)
Patient transported to CT 

## 2017-02-26 NOTE — ED Notes (Signed)
Report given to [name] at [facility], [name] states she will call [facility] transport team and let this RN know if transportation can be arranged

## 2017-02-26 NOTE — ED Notes (Signed)
This RN attempted to [facility] to give [facility] and to notify that pt was ready for pick up. Got a VM recording. Son is aware of pt status and that she will be discharged back to facility. SonCammy Copa- Charles Statz [phone] work.

## 2017-02-26 NOTE — ED Notes (Signed)
Pt cleaned and changed, awaiting EMS

## 2017-02-26 NOTE — ED Provider Notes (Signed)
[facility] Emergency Department Provider Note   ____________________________________________   I have reviewed the triage vital signs and the nursing notes.   HISTORY  Chief Complaint Urinary Frequency   History limited by: Not Limited   HPI Stacey ManchesterMiriam Janish is a 83 y.o. female who presents to the emergency department today because of concerns for urinary tract infection. Patient was seen in the emergency department earlier today after a mechanical fall. She was medically cleared. Upon returning to her living facility apparently the doctor saw her and a urine dip was performed. This was consistent with a UTI so the doctor asked for her to be evaluated in the emergency department. The patient herself states she feels like she is in her normal state of health. She denies any pain with urination. She denies any fevers.    Past Medical History:  Diagnosis Date  . Apraxia following cerebral infarction   . CKD (chronic kidney disease), stage III    a. With acute worsening/AKI noted on several admissions.  . Dementia   . Dementia without behavioral disturbance   . Depression   . Encephalopathy   . Hyperkalemia    a. 08/2015  . Hyperlipidemia   . Hyperlipidemia   . Hypertension   . Iron deficiency anemia   . Major depressive disorder   . Muscle weakness   . Parkinson disease (HCC)   . Parkinson's disease (HCC)   . Pericardial effusion    a. 06/2016 Echo: Ef 60-65%, no rwma, Gr1 DD, mild MR, mildly dil LA, PASP 72mmHg, mod circumferential pericardial effusion - no hemodynamic compromise.  . Sinus bradycardia    a. 08/2015 in setting of beta blocker and hyperkalemia.  . Stroke (HCC)   . TIA (transient ischemic attack)    a. 08/2015 - essentially neg w/u for stroke.  Marland Kitchen. UTI (urinary tract infection)     Patient Active Problem List   Diagnosis Date Noted  . Hypoxia   . Atrial tachycardia (HCC)   . Generalized weakness 07/05/2016  . Thrombocytopenia (HCC)  07/05/2016  . UTI (urinary tract infection) 07/05/2016  . Acute respiratory failure (HCC) 07/05/2016  . Pleural effusion 07/05/2016  . CKD (chronic kidney disease), stage III   . Pericardial effusion   . Sinus bradycardia   . Acute renal failure (ARF) (HCC) 12/31/2015  . Chronic renal insufficiency 09/15/2015  . Bradycardia 09/15/2015  . Cellulitis 09/13/2015  . Parkinson disease (HCC) 09/13/2015  . HTN (hypertension) 09/13/2015  . HLD (hyperlipidemia) 09/13/2015  . Depression 09/13/2015  . Dementia 09/13/2015  . Hyperkalemia 09/13/2015  . TIA (transient ischemic attack) 08/27/2015    Past Surgical History:  Procedure Laterality Date  . ABDOMINAL HYSTERECTOMY    . APPENDECTOMY    . CHOLECYSTECTOMY      Prior to Admission medications   Medication Sig Start Date End Date Taking? Authorizing Provider  acetaminophen (TYLENOL) 325 MG tablet Take 650 mg by mouth every 6 (six) hours as needed.    [provider]  acidophilus (RISAQUAD) CAPS capsule Take 1 capsule by mouth 2 (two) times daily.    [provider]  amLODipine (NORVASC) 10 MG tablet Take 1 tablet (10 mg total) by mouth daily. 09/15/15   Katharina CaperVaickute, Rima, MD  aspirin EC 325 MG tablet Take 1 tablet (325 mg total) by mouth daily. 09/04/16 09/04/17  Jene EveryKinner, Robert, MD  atorvastatin (LIPITOR) 10 MG tablet Take 10 mg by mouth at bedtime.    [provider]  carbidopa-levodopa (SINEMET IR)  25-100 MG tablet Take 1 tablet by mouth 3 (three) times daily.    [provider]  cyclobenzaprine (FLEXERIL) 10 MG tablet Take 10 mg by mouth 3 (three) times daily as needed for muscle spasms.    [provider]  diclofenac sodium (VOLTAREN) 1 % GEL Apply 2 g topically 2 (two) times daily. Pt applies to left knee.    [provider]  donepezil (ARICEPT) 10 MG tablet Take 10 mg by mouth daily. 12/22/16   [provider]  escitalopram (LEXAPRO) 10 MG tablet Take 10 mg by mouth daily.     [provider]  ferrous sulfate 325 (65 FE) MG tablet Take 325 mg by mouth 2 (two) times daily.    [provider]  levETIRAcetam (KEPPRA) 250 MG tablet Take 250 mg by mouth daily.     [provider]  mirtazapine (REMERON) 15 MG tablet Take 15 mg by mouth at bedtime.    [provider]  Multiple Vitamins-Minerals (PRESERVISION AREDS 2 PO) Take 2 capsules by mouth daily.    [provider]  QUEtiapine (SEROQUEL) 25 MG tablet Take 25 mg by mouth at bedtime.    [provider]  Soft Lens Products (EQ SALINE SOLUTION/SENSITIVE) SOLN Apply 1 application topically every other day. Apply to neck topically once every other day for wound until healed.    [provider]  vitamin B-12 (CYANOCOBALAMIN) 1000 MCG tablet Take 1,000 mcg by mouth daily.    [provider]    Allergies Penicillins and Plavix [clopidogrel bisulfate]  Family History  Problem Relation Age of Onset  . Heart disease Mother   . Hypertension Other     Social History Social History  Substance Use Topics  . Smoking status: Never Smoker  . Smokeless tobacco: Never Used  . Alcohol use No    Review of Systems Constitutional: No fever/chills Eyes: No visual changes. ENT: No sore throat. Cardiovascular: Denies chest pain. Respiratory: Denies shortness of breath. Gastrointestinal: No abdominal pain.  No nausea, no vomiting.  No diarrhea.   Genitourinary: Negative for dysuria. Musculoskeletal: Negative for back pain. Skin: Positive for bruising. Neurological: Negative for headaches, focal weakness or numbness.  ____________________________________________   PHYSICAL EXAM:  VITAL SIGNS: ED Triage Vitals  Enc Vitals Group     BP 02/26/17 1713 (!) 133/58     Pulse Rate 02/26/17 1713 73     Resp 02/26/17 1713 20     Temp 02/26/17 1713 98.2 F (36.8 C)     Temp src --      SpO2 02/26/17 1713 93 %     Weight 02/26/17 1714 140 lb (63.5 kg)      Height 02/26/17 1714 5\' 3"  (1.6 m)   Constitutional: Alert and oriented. Well appearing and in no distress. Eyes: Conjunctivae are normal. Normal extraocular movements. ENT   Head: Normocephalic and atraumatic.   Nose: No congestion/rhinnorhea.   Mouth/Throat: Mucous membranes are moist.   Neck: No stridor. Hematological/Lymphatic/Immunilogical: No cervical lymphadenopathy. Cardiovascular: Normal rate, regular rhythm.  No murmurs, rubs, or gallops.  Respiratory: Normal respiratory effort without tachypnea nor retractions. Breath sounds are clear and equal bilaterally. No wheezes/rales/rhonchi. Gastrointestinal: Soft and non tender. No rebound. No guarding.  Genitourinary: Deferred Musculoskeletal: Normal range of motion in all extremities. No lower extremity edema. Neurologic:  Normal speech and language. No gross focal neurologic deficits are appreciated.  Skin:  Ecchymosis noted over the left side of the patients face. Psychiatric: Mood and affect are  normal. Speech and behavior are normal. Patient exhibits appropriate insight and judgment.  ____________________________________________    LABS (pertinent positives/negatives)  Labs Reviewed  URINALYSIS, COMPLETE (UACMP) WITH MICROSCOPIC - Abnormal; Notable for the following:       Result Value   Color, Urine YELLOW (*)    APPearance CLEAR (*)    Squamous Epithelial / LPF 0-5 (*)    All other components within normal limits  CBC WITH DIFFERENTIAL/PLATELET - Abnormal; Notable for the following:    RBC 2.79 (*)    Hemoglobin 8.1 (*)    HCT 24.6 (*)    RDW 16.5 (*)    Platelets 111 (*)    Lymphs Abs 0.7 (*)    All other components within normal limits  BASIC METABOLIC PANEL - Abnormal; Notable for the following:    Potassium 5.4 (*)    BUN 34 (*)    Creatinine, Ser 3.17 (*)    Calcium 8.4 (*)    GFR calc non Af Amer 13 (*)    GFR calc Af Amer 15 (*)    All other components within normal limits  TROPONIN I      ____________________________________________   EKG  I, Phineas Semen, attending physician, personally viewed and interpreted this EKG  EKG Time: 1801 Rate: 71 Rhythm: normal sinus rhythm Axis: normal Intervals: qtc 434 QRS: low voltage, q waves V1, V2 ST changes: no st elevation Impression: abnormal ekg   ____________________________________________    RADIOLOGY  None   ____________________________________________   PROCEDURES  Procedures  ____________________________________________   INITIAL IMPRESSION / ASSESSMENT AND PLAN / ED COURSE  Pertinent labs & imaging results that were available during my care of the patient were reviewed by me and considered in my medical decision making (see chart for details).  Patient presented to the emergency department today from living facility because of concerns for possible urinary tract infection. Additionally they did have some concerns for confusion. Blood work here did show an elevation of the creatinine raising concerns for acute kidney injury. Additionally patient's hemoglobin was low and she was mildly guaiac positive. Patient was started on IV fluids and will be admitted to the hospital service.  ____________________________________________   FINAL CLINICAL IMPRESSION(S) / ED DIAGNOSES  Final diagnoses:  Ecchymosis  AKI (acute kidney injury) (HCC)  Anemia, unspecified type     Note: This dictation was prepared with Dragon dictation. Any transcriptional errors that result from this process are unintentional     Phineas Semen, MD 02/26/17 2315

## 2017-02-26 NOTE — ED Notes (Signed)
Per [name] at [facility] pt to be transported via EMS

## 2017-02-26 NOTE — H&P (Signed)
[facility] - [place] [facility]   PATIENT NAME: Stacey Baker    MR#:  [id number]  DATE OF BIRTH:  [birth year]  DATE OF ADMISSION:  02/26/2017  PRIMARY CARE PHYSICIAN: Merlene Pulling, PA-C   REQUESTING/REFERRING PHYSICIAN:   CHIEF COMPLAINT:   Chief Complaint  Patient presents with  . Urinary Frequency    HISTORY OF PRESENT ILLNESS: Stacey Baker  is a 83 y.o. female with a known history of CAD stage III, peripheral effusion, pleural effusion, chronic diastolic CHF, seizures, Parkinson's disease, memory loss, aphasia, who presents to the hospital with confusion. Patient fell down earlier today and was seen by emergency room physician, who discharged her to the facility after evaluation. She comes back due to worsening confusion. Her labs revealed worsening kidney failure with creatinine level up to 3.17 from 1.8 baseline, she was also noted to be more anemic with hemoglobin level drifting down to 8.1 from 10.5 in April 2018. Potassium was found to be elevated. Hemoccult of stool was minimally. Hospitalist services were contacted for admission guaiac-positive.  PAST MEDICAL HISTORY:   Past Medical History:  Diagnosis Date  . Apraxia following cerebral infarction   . CKD (chronic kidney disease), stage III    a. With acute worsening/AKI noted on several admissions.  . Dementia   . Dementia without behavioral disturbance   . Depression   . Encephalopathy   . Hyperkalemia    a. 08/2015  . Hyperlipidemia   . Hyperlipidemia   . Hypertension   . Iron deficiency anemia   . Major depressive disorder   . Muscle weakness   . Parkinson disease (HCC)   . Parkinson's disease (HCC)   . Pericardial effusion    a. 06/2016 Echo: Ef 60-65%, no rwma, Gr1 DD, mild MR, mildly dil LA, PASP , mod circumferential pericardial effusion - no hemodynamic compromise.  . Sinus bradycardia    a. 08/2015 in setting of beta blocker and hyperkalemia.  . Stroke (HCC)    . TIA (transient ischemic attack)    a. 08/2015 - essentially neg w/u for stroke.  Marland Kitchen UTI (urinary tract infection)     PAST SURGICAL HISTORY: Past Surgical History:  Procedure Laterality Date  . ABDOMINAL HYSTERECTOMY    . APPENDECTOMY    . CHOLECYSTECTOMY      SOCIAL HISTORY:  Social History  Substance Use Topics  . Smoking status: Never Smoker  . Smokeless tobacco: Never Used  . Alcohol use No    FAMILY HISTORY:  Family History  Problem Relation Age of Onset  . Heart disease Mother   . Hypertension Other     DRUG ALLERGIES:  Allergies  Allergen Reactions  . Penicillins Other (See Comments)    Reaction:  Unknown   . Plavix [Clopidogrel Bisulfate] Other (See Comments)    Reaction:  Unknown     Review of Systems  Unable to perform ROS: Dementia    MEDICATIONS AT HOME:  Prior to Admission medications   Medication Sig Start Date End Date Taking? Authorizing Provider  acetaminophen (TYLENOL) 325 MG tablet Take 650 mg by mouth every 6 (six) hours as needed.   Yes [provider]  acidophilus (RISAQUAD) CAPS capsule Take 1 capsule by mouth 2 (two) times daily.   Yes [provider]  amLODipine (NORVASC) 10 MG tablet Take 1 tablet (10 mg total) by mouth daily. 09/15/15  Yes Katharina Caper, MD  aspirin EC 325 MG tablet Take 1 tablet (325 mg total) by  mouth daily. 09/04/16 09/04/17 Yes Jene Every, MD  atorvastatin (LIPITOR) 10 MG tablet Take 10 mg by mouth at bedtime.   Yes [provider]  carbidopa-levodopa (SINEMET IR) 25-100 MG tablet Take 1 tablet by mouth 3 (three) times daily.   Yes [provider]  cyclobenzaprine (FLEXERIL) 10 MG tablet Take 10 mg by mouth 3 (three) times daily as needed for muscle spasms.   Yes [provider]  diclofenac sodium (VOLTAREN) 1 % GEL Apply 2 g topically 2 (two) times daily. Pt applies to left knee.   Yes [provider]  donepezil (ARICEPT) 10 MG tablet Take 10 mg by mouth  daily. 12/22/16  Yes [provider]  escitalopram (LEXAPRO) 10 MG tablet Take 10 mg by mouth daily.   Yes [provider]  ferrous sulfate 325 (65 FE) MG tablet Take 325 mg by mouth 2 (two) times daily.   Yes [provider]  levETIRAcetam (KEPPRA) 250 MG tablet Take 250 mg by mouth daily.    Yes [provider]  miconazole (BAZA ANTIFUNGAL) 2 % cream Apply 1 application topically 2 (two) times daily as needed. To sacrum every shift for redness after each toilet   Yes [provider]  mirtazapine (REMERON) 15 MG tablet Take 15 mg by mouth at bedtime.   Yes [provider]  Multiple Vitamins-Minerals (PRESERVISION AREDS 2 PO) Take 2 capsules by mouth daily.   Yes [provider]  QUEtiapine (SEROQUEL) 25 MG tablet Take 25 mg by mouth at bedtime.   Yes [provider]  Soft Lens Products (EQ SALINE SOLUTION/SENSITIVE) SOLN Apply 1 application topically every other day. Apply to neck topically once every other day for wound until healed.   Yes [provider]  sulfamethoxazole-trimethoprim (BACTRIM DS,SEPTRA DS) 800-160 MG tablet Take 1 tablet by mouth 2 (two) times daily.   Yes [provider]  vitamin B-12 (CYANOCOBALAMIN) 1000 MCG tablet Take 1,000 mcg by mouth daily.   Yes [provider]      PHYSICAL EXAMINATION:   VITAL SIGNS: Blood pressure (!) 133/58, pulse 73, temperature 98.2 F (36.8 C), resp. rate 20, height 5\' 3"  (1.6 m), weight 63.5 kg (140 lb), SpO2 93 %.  GENERAL:  83 y.o.-year-old patient lying in the bed with no acute distress.  EYES: Pupils equal, round, reactive to light and accommodation. No scleral icterus. Extraocular muscles intact.  HEENT: Head evaluation revealed bruising around her left eye, forehead, normocephalic. Oropharynx and nasopharynx clear.  NECK:  Supple, no jugular venous distention. No thyroid enlargement, no tenderness.  LUNGS Diminished breath sounds  bilaterally at bases , no wheezing, rales,rhonchi or crepitation. No use of accessory muscles of respiration.  CARDIOVASCULAR: S1, S2 normal. No murmurs, rubs, or gallops.  ABDOMEN: Soft, nontender, nondistended. Bowel sounds present. No organomegaly or mass.  EXTREMITIES: No pedal edema, cyanosis, or clubbing.  NEUROLOGIC: Cranial nerves II through XII are intact. Muscle strength 5/5 in all extremities. Sensation intact. Gait not checked.  PSYCHIATRIC: The patient is alert, confused  SKIN: No obvious rash, lesion, or ulcer.   LABORATORY PANEL:   CBC  Recent Labs Lab 02/26/17 1807  WBC 6.0  HGB 8.1*  HCT 24.6*  PLT 111*  MCV 88.0  MCH 28.8  MCHC 32.8  RDW 16.5*  LYMPHSABS 0.7*  MONOABS 0.5  EOSABS 0.0  BASOSABS 0.1   ------------------------------------------------------------------------------------------------------------------  Chemistries   Recent Labs Lab 02/26/17 1807  NA 137  K 5.4*  CL 106  CO2  22  GLUCOSE 89  BUN 34*  CREATININE 3.17*  CALCIUM 8.4*   ------------------------------------------------------------------------------------------------------------------  Cardiac Enzymes  Recent Labs Lab 02/26/17 1807  TROPONINI <0.03   ------------------------------------------------------------------------------------------------------------------  RADIOLOGY: Dg Chest 1 View  Result Date: 02/26/2017 CLINICAL DATA:  Unwitnessed fall today. EXAM: CHEST 1 VIEW COMPARISON:  Radiographs of January 30, 2017. FINDINGS: Stable cardiomegaly. No pneumothorax is noted. Stable mild central pulmonary vascular congestion is noted. Bony thorax is unremarkable. Minimal right pleural effusion is noted which is significantly improved compared to prior exam. Mild bibasilar subsegmental atelectasis is noted. IMPRESSION: Stable cardiomegaly and mild central pulmonary vascular congestion. Mild bibasilar subsegmental atelectasis is noted, with minimal right pleural effusion which  is improved compared to prior exam. Electronically Signed   By: Lupita RaiderJames  Green Jr, M.D.   On: 02/26/2017 08:47   Dg Pelvis 1-2 Views  Result Date: 02/26/2017 CLINICAL DATA:  Fall. EXAM: PELVIS - 1-2 VIEW COMPARISON:  None. FINDINGS: There is no evidence of pelvic fracture or diastasis. No pelvic bone lesions are seen. IMPRESSION: No definite abnormality seen in the pelvis. Electronically Signed   By: Lupita RaiderJames  Green Jr, M.D.   On: 02/26/2017 08:49   Ct Head Wo Contrast  Result Date: 02/26/2017 CLINICAL DATA:  Fall, head injury, neck pain, facial swelling EXAM: CT HEAD WITHOUT CONTRAST CT MAXILLOFACIAL WITHOUT CONTRAST CT CERVICAL SPINE WITHOUT CONTRAST TECHNIQUE: Multidetector CT imaging of the head, cervical spine, and maxillofacial structures were performed using the standard protocol without intravenous contrast. Multiplanar CT image reconstructions of the cervical spine and maxillofacial structures were also generated. COMPARISON:  01/30/2017 FINDINGS: CT HEAD FINDINGS Brain: There is atrophy and chronic small vessel disease changes. No acute intracranial abnormality. Specifically, no hemorrhage, hydrocephalus, mass lesion, acute infarction, or significant intracranial injury. Vascular: No hyperdense vessel or unexpected calcification. Skull: No acute calvarial abnormality. Other: None CT MAXILLOFACIAL FINDINGS Osseous: Patient motion limits. While it a despite repeating study. No visible facial fracture. Orbits: Negative. No traumatic or inflammatory finding. Sinuses: Clear. Soft tissues: Soft tissue swelling over the left face and lateral orbit CT CERVICAL SPINE FINDINGS Alignment: Normal Skull base and vertebrae: No fracture. Soft tissues and spinal canal: No epidural or paraspinal hematoma. Prevertebral soft tissues are normal. Disc levels: Disc space narrowing to mid and lower cervical spine. Degenerative facet disease bilaterally. Upper chest: Moderate bilateral pleural effusions are noted. Airspace  opacity anteriorly in the right upper lobe. Other: None IMPRESSION: Atrophy, chronic small vessel disease. No acute intracranial abnormality. No evidence of facial or orbital fracture. No acute bony abnormality in the cervical spine.  Spondylosis. Moderate bilateral pleural effusions image in the upper chest. Patchy opacity anteriorly in the right upper lobe concerning for pneumonia, less likely pulmonary nodule. Electronically Signed   By: Charlett NoseKevin  Dover M.D.   On: 02/26/2017 08:46   Ct Cervical Spine Wo Contrast  Result Date: 02/26/2017 CLINICAL DATA:  Fall, head injury, neck pain, facial swelling EXAM: CT HEAD WITHOUT CONTRAST CT MAXILLOFACIAL WITHOUT CONTRAST CT CERVICAL SPINE WITHOUT CONTRAST TECHNIQUE: Multidetector CT imaging of the head, cervical spine, and maxillofacial structures were performed using the standard protocol without intravenous contrast. Multiplanar CT image reconstructions of the cervical spine and maxillofacial structures were also generated. COMPARISON:  01/30/2017 FINDINGS: CT HEAD FINDINGS Brain: There is atrophy and chronic small vessel disease changes. No acute intracranial abnormality. Specifically, no hemorrhage, hydrocephalus, mass lesion, acute infarction, or significant intracranial injury. Vascular: No hyperdense vessel or unexpected calcification. Skull: No acute calvarial abnormality. Other: None CT MAXILLOFACIAL  FINDINGS Osseous: Patient motion limits. While it a despite repeating study. No visible facial fracture. Orbits: Negative. No traumatic or inflammatory finding. Sinuses: Clear. Soft tissues: Soft tissue swelling over the left face and lateral orbit CT CERVICAL SPINE FINDINGS Alignment: Normal Skull base and vertebrae: No fracture. Soft tissues and spinal canal: No epidural or paraspinal hematoma. Prevertebral soft tissues are normal. Disc levels: Disc space narrowing to mid and lower cervical spine. Degenerative facet disease bilaterally. Upper chest: Moderate  bilateral pleural effusions are noted. Airspace opacity anteriorly in the right upper lobe. Other: None IMPRESSION: Atrophy, chronic small vessel disease. No acute intracranial abnormality. No evidence of facial or orbital fracture. No acute bony abnormality in the cervical spine.  Spondylosis. Moderate bilateral pleural effusions image in the upper chest. Patchy opacity anteriorly in the right upper lobe concerning for pneumonia, less likely pulmonary nodule. Electronically Signed   By: Charlett Nose M.D.   On: 02/26/2017 08:46   Dg Hip Unilat W Or Wo Pelvis 2-3 Views Right  Result Date: 02/26/2017 CLINICAL DATA:  Right hip pain after fall. EXAM: DG HIP (WITH OR WITHOUT PELVIS) 2-3V RIGHT COMPARISON:  None. FINDINGS: There is no evidence of hip fracture or dislocation. There is no evidence of arthropathy or other focal bone abnormality. IMPRESSION: Normal right hip. Electronically Signed   By: Lupita Raider, M.D.   On: 02/26/2017 08:50   Ct Maxillofacial Wo Contrast  Result Date: 02/26/2017 CLINICAL DATA:  Fall, head injury, neck pain, facial swelling EXAM: CT HEAD WITHOUT CONTRAST CT MAXILLOFACIAL WITHOUT CONTRAST CT CERVICAL SPINE WITHOUT CONTRAST TECHNIQUE: Multidetector CT imaging of the head, cervical spine, and maxillofacial structures were performed using the standard protocol without intravenous contrast. Multiplanar CT image reconstructions of the cervical spine and maxillofacial structures were also generated. COMPARISON:  01/30/2017 FINDINGS: CT HEAD FINDINGS Brain: There is atrophy and chronic small vessel disease changes. No acute intracranial abnormality. Specifically, no hemorrhage, hydrocephalus, mass lesion, acute infarction, or significant intracranial injury. Vascular: No hyperdense vessel or unexpected calcification. Skull: No acute calvarial abnormality. Other: None CT MAXILLOFACIAL FINDINGS Osseous: Patient motion limits. While it a despite repeating study. No visible facial fracture.  Orbits: Negative. No traumatic or inflammatory finding. Sinuses: Clear. Soft tissues: Soft tissue swelling over the left face and lateral orbit CT CERVICAL SPINE FINDINGS Alignment: Normal Skull base and vertebrae: No fracture. Soft tissues and spinal canal: No epidural or paraspinal hematoma. Prevertebral soft tissues are normal. Disc levels: Disc space narrowing to mid and lower cervical spine. Degenerative facet disease bilaterally. Upper chest: Moderate bilateral pleural effusions are noted. Airspace opacity anteriorly in the right upper lobe. Other: None IMPRESSION: Atrophy, chronic small vessel disease. No acute intracranial abnormality. No evidence of facial or orbital fracture. No acute bony abnormality in the cervical spine.  Spondylosis. Moderate bilateral pleural effusions image in the upper chest. Patchy opacity anteriorly in the right upper lobe concerning for pneumonia, less likely pulmonary nodule. Electronically Signed   By: Charlett Nose M.D.   On: 02/26/2017 08:46    EKG: Orders placed or performed during the hospital encounter of 02/26/17  . ED EKG  . ED EKG  EKG in emergency room reveals sinus rhythm at 71 bpm, atrial premature complexes, borderline low voltage in extremity leads, probable anteroseptal infarct, nonspecific T-wave abnormality in lateral leads   IMPRESSION AND PLAN:  Active Problems:   Generalized weakness   Acute on chronic renal failure (HCC)   Anemia   Guaiac positive stools   Fall  #  1. Acute on chronic renal failure, etiology is unclear, urinalysis was unremarkable, initiate patient on low rate IV fluids, follow creatinine in the morning. Get bladder scan #2. Hyperkalemia, due to renal failure, give one dose of Kayexalate, recheck potassium in the morning #3 anemia and Hemoccult-positive stool, concerning for chronic gastrointestinal blood loss, follow hemoglobin level. With rehydration, transfuse patient as needed. Holding aspirin. Initiate Protonix #4.  Generalized weakness, fall, getting physical therapist, palpation of therapy, speech therapist evaluation for further recommendations #5 chronic diastolic CHF, seems to be stable, follow with rehydration  All the records are reviewed and case discussed with ED provider. Management plans discussed with the patient, family and they are in agreement.  CODE STATUS: Code Status History    Date Active Date Inactive Code Status Order ID Comments User Context   07/05/2016  5:47 PM 07/09/2016  7:51 PM Full Code [id number]  Marguarite Arbour, MD ED   07/03/2016 11:04 AM 07/03/2016  5:26 PM Full Code [id number]  Milagros Loll, MD ED   12/31/2015  5:42 PM 01/06/2016  7:12 PM Full Code [id number]  [name], MD Inpatient   09/13/2015 10:55 PM 09/15/2015  8:25 PM Full Code [id number]  Oralia Manis, MD Inpatient   08/28/2015  8:14 AM 08/29/2015  4:49 PM DNR [id number]  Delfino Lovett, MD Inpatient   08/27/2015 11:25 PM 08/28/2015  8:14 AM Full Code [id number]  Hower, Cletis Athens, MD ED    Advance Directive Documentation     Most Recent Value  Type of Advance Directive  Out of facility DNR (pink MOST or yellow form)  Pre-existing out of facility DNR order (yellow form or pink MOST form)  -  "MOST" Form in Place?  -       TOTAL TIME TAKING CARE OF THIS PAT50 minutes.    Katharina Caper M.D on 02/26/2017 at 8:02 PM  Between 7am to 6pm - Pager - [phone] After 6pm go to [email or web address] - password [email or web address] [facility]  [facility]  Office  [phone]  CC: Primary care physician; Merlene Pulling, PA-C

## 2017-02-26 NOTE — ED Notes (Signed)
Pt awaiting EMS transport, given water, awake and alert

## 2017-02-26 NOTE — ED Provider Notes (Signed)
[facility] Emergency Department Provider Note       Time seen: ----------------------------------------- 7:50 AM on 02/26/2017 -----------------------------------------  L5 caveat: Review of systems and history is limited by dementia.   I have reviewed the triage vital signs and the nursing notes.   HISTORY   Chief Complaint Fall    HPI Stacey Baker is a 83 y.o. female who presents to the ED for a mechanical fall. Patient is demented but was able to convey that she was reaching over to get something off her nightstand when she fell and struck the left side of her face. She presents with left-sided facial pain and swelling. EMS reports she was complaining of neck pain prior to arrival so she is placed in a soft cervical collar. Patient denies any other specific injuries or complaints. Fall occurred just prior to arrival   Past Medical History:  Diagnosis Date  . Apraxia following cerebral infarction   . CKD (chronic kidney disease), stage III    a. With acute worsening/AKI noted on several admissions.  . Dementia   . Dementia without behavioral disturbance   . Depression   . Encephalopathy   . Hyperkalemia    a. 08/2015  . Hyperlipidemia   . Hyperlipidemia   . Hypertension   . Iron deficiency anemia   . Major depressive disorder   . Muscle weakness   . Parkinson disease (HCC)   . Parkinson's disease (HCC)   . Pericardial effusion    a. 06/2016 Echo: Ef 60-65%, no rwma, Gr1 DD, mild MR, mildly dil LA, PASP 72mmHg, mod circumferential pericardial effusion - no hemodynamic compromise.  . Sinus bradycardia    a. 08/2015 in setting of beta blocker and hyperkalemia.  . Stroke (HCC)   . TIA (transient ischemic attack)    a. 08/2015 - essentially neg w/u for stroke.  Marland Kitchen. UTI (urinary tract infection)     Patient Active Problem List   Diagnosis Date Noted  . Hypoxia   . Atrial tachycardia (HCC)   . Generalized weakness 07/05/2016  .  Thrombocytopenia (HCC) 07/05/2016  . UTI (urinary tract infection) 07/05/2016  . Acute respiratory failure (HCC) 07/05/2016  . Pleural effusion 07/05/2016  . CKD (chronic kidney disease), stage III   . Pericardial effusion   . Sinus bradycardia   . Acute renal failure (ARF) (HCC) 12/31/2015  . Chronic renal insufficiency 09/15/2015  . Bradycardia 09/15/2015  . Cellulitis 09/13/2015  . Parkinson disease (HCC) 09/13/2015  . HTN (hypertension) 09/13/2015  . HLD (hyperlipidemia) 09/13/2015  . Depression 09/13/2015  . Dementia 09/13/2015  . Hyperkalemia 09/13/2015  . TIA (transient ischemic attack) 08/27/2015    Past Surgical History:  Procedure Laterality Date  . ABDOMINAL HYSTERECTOMY    . APPENDECTOMY    . CHOLECYSTECTOMY      Allergies Penicillins and Plavix [clopidogrel bisulfate]  Social History Social History  Substance Use Topics  . Smoking status: Never Smoker  . Smokeless tobacco: Never Used  . Alcohol use No    Review of Systems Review of systems difficult to obtain due to dementia Neurological: Positive for headache and facial pain  All systems negative/normal/unremarkable except as stated in the HPI  ____________________________________________   PHYSICAL EXAM:  VITAL SIGNS: ED Triage Vitals [02/26/17 0749]  Enc Vitals Group     BP 129/89     Pulse Rate 83     Resp 14     Temp 98.1 F (36.7 C)     Temp Source Oral  SpO2 95 %     Weight      Height      Head Circumference      Peak Flow      Pain Score      Pain Loc      Pain Edu?      Excl. in GC?     Constitutional: Alert, Well appearing and in no distress. Eyes: Conjunctivae are normal. PERRL. Normal extraocular movements. ENT   Head: Normocephalic, Large left facial contusions are noted   Nose: No congestion/rhinnorhea.   Mouth/Throat: Mucous membranes are moist.   Neck: No stridor. Cardiovascular: Normal rate, regular rhythm. No murmurs, rubs, or  gallops. Respiratory: Normal respiratory effort without tachypnea nor retractions. Breath sounds are clear and equal bilaterally. No wheezes/rales/rhonchi. Gastrointestinal: Soft and nontender. Normal bowel sounds Musculoskeletal: Limited but unremarkable range of motion of the extremities Neurologic:  Normal speech and language. No gross focal neurologic deficits are appreciated.  Skin:  Small bleeding from a skin tear around the proximal right third digit dorsally Psychiatric: Mood and affect are normal. Speech and behavior are normal.   ____________________________________________  ED COURSE:  Pertinent labs & imaging results that were available during my care of the patient were reviewed by me and considered in my medical decision making (see chart for details). Patient presents for a mechanical fall, we will assess with imaging as indicated.   Procedures ____________________________________________   RADIOLOGY Images were viewed by me  CT head, C-spine, maxillofacial Chest x-ray, pelvis x-ray IMPRESSION: Stable cardiomegaly and mild central pulmonary vascular congestion. Mild bibasilar subsegmental atelectasis is noted, with minimal right pleural effusion which is improved compared to prior exam. IMPRESSION: Normal right hip. IMPRESSION: Atrophy, chronic small vessel disease. No acute intracranial abnormality.  No evidence of facial or orbital fracture.  No acute bony abnormality in the cervical spine. Spondylosis.  Moderate bilateral pleural effusions image in the upper chest. Patchy opacity anteriorly in the right upper lobe concerning for pneumonia, less likely pulmonary nodule. ____________________________________________  FINAL ASSESSMENT AND PLAN  Fall, facial contusion, skin tear  Plan: Patient's imaging was dictated above. Patient had presented for mechanical fall. X-rays do not reveal any acute finding. She does have cardiomegaly and pulmonary vascular  congestion which is likely not acute. She is stable for outpatient follow-up.   Emily Filbert, MD   Note: This note was generated in part or whole with voice recognition software. Voice recognition is usually quite accurate but there are transcription errors that can and very often do occur. I apologize for any typographical errors that were not detected and corrected.     Emily Filbert, MD 02/26/17 [id number]

## 2017-02-26 NOTE — ED Triage Notes (Signed)
[facility].  Pt seen earlier today for fall.  [facility] and was sent back for treatment for UTI.

## 2017-02-26 NOTE — ED Notes (Signed)
Pt returns from CT and xray at this time.

## 2017-02-26 NOTE — ED Triage Notes (Signed)
Pt to ed via [facility] from [place] with reports of unwitnessed fall this am. Pt was reaching for something to drink on her night stand and fell off her bed hitting the  Left side of face on corner of night stand. Also reports skin tear to right third finger. Pt with dementia at baseline and takes aricept for same. Pt arrives to ed with towel c-collar placed by ems. Ems reports all vss. Pt with DNR on arrival. nad.

## 2017-02-27 ENCOUNTER — Inpatient Hospital Stay: Payer: Medicare Other

## 2017-02-27 LAB — CBC
HCT: 22.9 % — ABNORMAL LOW (ref 35.0–47.0)
Hemoglobin: 7.6 g/dL — ABNORMAL LOW (ref 12.0–16.0)
MCH: 29 pg (ref 26.0–34.0)
MCHC: 33.1 g/dL (ref 32.0–36.0)
MCV: 87.6 fL (ref 80.0–100.0)
PLATELETS: 99 10*3/uL — AB (ref 150–440)
RBC: 2.61 MIL/uL — ABNORMAL LOW (ref 3.80–5.20)
RDW: 16.9 % — AB (ref 11.5–14.5)
WBC: 4.9 10*3/uL (ref 3.6–11.0)

## 2017-02-27 LAB — BASIC METABOLIC PANEL
Anion gap: 5 (ref 5–15)
BUN: 33 mg/dL — ABNORMAL HIGH (ref 6–20)
CHLORIDE: 113 mmol/L — AB (ref 101–111)
CO2: 23 mmol/L (ref 22–32)
CREATININE: 2.98 mg/dL — AB (ref 0.44–1.00)
Calcium: 8 mg/dL — ABNORMAL LOW (ref 8.9–10.3)
GFR calc Af Amer: 16 mL/min — ABNORMAL LOW (ref >60)
GFR calc non Af Amer: 14 mL/min — ABNORMAL LOW (ref >60)
Glucose, Bld: 86 mg/dL (ref 65–99)
Potassium: 4.8 mmol/L (ref 3.5–5.1)
SODIUM: 141 mmol/L (ref 135–145)

## 2017-02-27 LAB — TROPONIN I
Troponin I: 0.03 ng/mL (ref <0.03)
Troponin I: 0.03 ng/mL (ref <0.03)

## 2017-02-27 LAB — IRON AND TIBC
Iron: 27 ug/dL — ABNORMAL LOW (ref 28–170)
Saturation Ratios: 14 % (ref 10.4–31.8)
TIBC: 199 ug/dL — ABNORMAL LOW (ref 250–450)
UIBC: 172 ug/dL

## 2017-02-27 LAB — GLUCOSE, CAPILLARY: Glucose-Capillary: 72 mg/dL (ref 65–99)

## 2017-02-27 LAB — FERRITIN: Ferritin: 176 ng/mL (ref 11–307)

## 2017-02-27 MED ORDER — IPRATROPIUM-ALBUTEROL 0.5-2.5 (3) MG/3ML IN SOLN
3.0000 mL | Freq: Four times a day (QID) | RESPIRATORY_TRACT | Status: DC | PRN
  Administered 2017-02-27 (×2): 3 mL via RESPIRATORY_TRACT
  Filled 2017-02-27 (×2): qty 3

## 2017-02-27 MED ORDER — MUPIROCIN CALCIUM 2 % EX CREA
TOPICAL_CREAM | Freq: Every day | CUTANEOUS | Status: DC
  Administered 2017-02-28 – 2017-03-09 (×10): via TOPICAL
  Filled 2017-02-27: qty 15

## 2017-02-27 NOTE — Consult Note (Signed)
Date: 02/27/2017                  Patient Name:  Samella Lucchetti  MRN: [id number]  DOB: 1932/04/01  Age / Sex: 83 y.o., female         PCP: Merlene Pulling, PA-C                 Service Requesting Consult: Hospitalist/ Katharina Caper, MD                 Reason for Consult: ARF            History of Present Illness: Patient is a 83 y.o. female with medical problems of Parkinson's disease, dementia,stroke, depression, pulmonary hypertension, who was admitted to [facility] on 02/26/2017 for evaluation of mental status changes status post fall.  Patient is not able to provide any meaningful information.  All information is obtained from the chart as well as patient's son Patient is admitted this time status post fall and for worsening confusion.  She has a large left periorbital hematoma. Her lab work shows acute renal failure.  Her baseline creatinine appears to be 1.8/GFR 25 Admission creatinine was  3.17 Today's creatinine is 2.98    Medications: Outpatient medications: Prescriptions Prior to Admission  Medication Sig Dispense Refill Last Dose  . acetaminophen (TYLENOL) 325 MG tablet Take 650 mg by mouth every 6 (six) hours as needed.   prn at prn  . acidophilus (RISAQUAD) CAPS capsule Take 1 capsule by mouth 2 (two) times daily.   02/26/2017 at 0900  . amLODipine (NORVASC) 10 MG tablet Take 1 tablet (10 mg total) by mouth daily. 30 tablet 5 02/25/2017 at 0900  . aspirin EC 325 MG tablet Take 1 tablet (325 mg total) by mouth daily. 30 tablet 3 02/26/2017 at 0800  . atorvastatin (LIPITOR) 10 MG tablet Take 10 mg by mouth at bedtime.   02/25/2017 at 2000  . carbidopa-levodopa (SINEMET IR) 25-100 MG tablet Take 1 tablet by mouth 3 (three) times daily.   02/26/2017 at 1400  . cyclobenzaprine (FLEXERIL) 10 MG tablet Take 10 mg by mouth 3 (three) times daily as needed for muscle spasms.   prn at prn  . diclofenac sodium (VOLTAREN) 1 % GEL Apply 2 g topically 2 (two) times daily. Pt applies to left  knee.   02/26/2017 at 0900  . donepezil (ARICEPT) 10 MG tablet Take 10 mg by mouth daily.   02/25/2017 at 2000  . escitalopram (LEXAPRO) 10 MG tablet Take 10 mg by mouth daily.   02/26/2017 at 0800  . ferrous sulfate 325 (65 FE) MG tablet Take 325 mg by mouth 2 (two) times daily.   02/26/2017 at 0900  . levETIRAcetam (KEPPRA) 250 MG tablet Take 250 mg by mouth daily.    02/26/2017 at 0800  . miconazole (BAZA ANTIFUNGAL) 2 % cream Apply 1 application topically 2 (two) times daily as needed. To sacrum every shift for redness after each toilet   prn at prn  . mirtazapine (REMERON) 15 MG tablet Take 15 mg by mouth at bedtime.   02/25/2017 at 2000  . Multiple Vitamins-Minerals (PRESERVISION AREDS 2 PO) Take 2 capsules by mouth daily.   02/26/2017 at 0800  . QUEtiapine (SEROQUEL) 25 MG tablet Take 25 mg by mouth at bedtime.   02/25/2017 at 2000  . Soft Lens Products (EQ SALINE SOLUTION/SENSITIVE) SOLN Apply 1 application topically every other day. Apply to neck topically once every other day for wound  until healed.   02/26/2017 at 0900  . sulfamethoxazole-trimethoprim (BACTRIM DS,SEPTRA DS) 800-160 MG tablet Take 1 tablet by mouth 2 (two) times daily.   02/26/2017 at 0800  . vitamin B-12 (CYANOCOBALAMIN) 1000 MCG tablet Take 1,000 mcg by mouth daily.   02/26/2017 at 0900    Current medications: Current Facility-Administered Medications  Medication Dose Route Frequency Provider Last Rate Last Dose  . acetaminophen (TYLENOL) tablet 650 mg  650 mg Oral Q6H PRN Katharina CaperVaickute, Rima, MD       Or  . acetaminophen (TYLENOL) suppository 650 mg  650 mg Rectal Q6H PRN Katharina CaperVaickute, Rima, MD      . acidophilus (RISAQUAD) capsule 1 capsule  1 capsule Oral BID Katharina CaperVaickute, Rima, MD   1 capsule at 02/27/17 0826  . amLODipine (NORVASC) tablet 10 mg  10 mg Oral Daily Katharina CaperVaickute, Rima, MD   10 mg at 02/27/17 0826  . atorvastatin (LIPITOR) tablet 10 mg  10 mg Oral QHS Vaickute, Rima, MD      . carbidopa-levodopa (SINEMET IR) 25-100 MG per tablet  immediate release 1 tablet  1 tablet Oral TID Katharina CaperVaickute, Rima, MD   1 tablet at 02/27/17 0828  . cyclobenzaprine (FLEXERIL) tablet 10 mg  10 mg Oral TID PRN Katharina CaperVaickute, Rima, MD      . diclofenac sodium (VOLTAREN) 1 % transdermal gel 2 g  2 g Topical BID Katharina CaperVaickute, Rima, MD   2 g at 02/27/17 0828  . donepezil (ARICEPT) tablet 10 mg  10 mg Oral Daily Katharina CaperVaickute, Rima, MD   10 mg at 02/27/17 0827  . escitalopram (LEXAPRO) tablet 10 mg  10 mg Oral Daily Katharina CaperVaickute, Rima, MD   10 mg at 02/27/17 0827  . ferrous sulfate tablet 325 mg  325 mg Oral BID Katharina CaperVaickute, Rima, MD   325 mg at 02/27/17 0826  . ipratropium-albuterol (DUONEB) 0.5-2.5 (3) MG/3ML nebulizer solution 3 mL  3 mL Nebulization Q6H PRN Hugelmeyer, Alexis, DO   3 mL at 02/27/17 1121  . levETIRAcetam (KEPPRA) tablet 250 mg  250 mg Oral Daily Katharina CaperVaickute, Rima, MD   250 mg at 02/27/17 0827  . mirtazapine (REMERON) tablet 15 mg  15 mg Oral QHS Katharina CaperVaickute, Rima, MD      . multivitamin-lutein (OCUVITE-LUTEIN) capsule   Oral Daily Katharina CaperVaickute, Rima, MD   1 capsule at 02/27/17 0827  . [START ON 02/28/2017] mupirocin cream (BACTROBAN) 2 %   Topical Daily Winona LegatoVaickute, Rima, MD      . ondansetron (ZOFRAN) tablet 4 mg  4 mg Oral Q6H PRN Katharina CaperVaickute, Rima, MD       Or  . ondansetron (ZOFRAN) injection 4 mg  4 mg Intravenous Q6H PRN Katharina CaperVaickute, Rima, MD      . pantoprazole (PROTONIX) EC tablet 40 mg  40 mg Oral Daily Katharina CaperVaickute, Rima, MD   40 mg at 02/27/17 0827  . QUEtiapine (SEROQUEL) tablet 25 mg  25 mg Oral QHS Vaickute, Rima, MD      . vitamin B-12 (CYANOCOBALAMIN) tablet 1,000 mcg  1,000 mcg Oral Daily Katharina CaperVaickute, Rima, MD   1,000 mcg at 02/27/17 0827      Allergies: Allergies  Allergen Reactions  . Penicillins Other (See Comments)    Reaction:  Unknown   . Plavix [Clopidogrel Bisulfate] Other (See Comments)    Reaction:  Unknown       Past Medical History: Past Medical History:  Diagnosis Date  . Apraxia following cerebral infarction   . CKD (chronic kidney  disease), stage III  a. With acute worsening/AKI noted on several admissions.  . Dementia   . Dementia without behavioral disturbance   . Depression   . Encephalopathy   . Hyperkalemia    a. 08/2015  . Hyperlipidemia   . Hyperlipidemia   . Hypertension   . Iron deficiency anemia   . Major depressive disorder   . Muscle weakness   . Parkinson disease (HCC)   . Parkinson's disease (HCC)   . Pericardial effusion    a. 06/2016 Echo: Ef 60-65%, no rwma, Gr1 DD, mild MR, mildly dil LA, PASP , mod circumferential pericardial effusion - no hemodynamic compromise.  . Sinus bradycardia    a. 08/2015 in setting of beta blocker and hyperkalemia.  . Stroke (HCC)   . TIA (transient ischemic attack)    a. 08/2015 - essentially neg w/u for stroke.  Marland Kitchen UTI (urinary tract infection)      Past Surgical History: Past Surgical History:  Procedure Laterality Date  . ABDOMINAL HYSTERECTOMY    . APPENDECTOMY    . CHOLECYSTECTOMY       Family History: Family History  Problem Relation Age of Onset  . Heart disease Mother   . Hypertension Other      Social History: Social History   Social History  . Marital status: Widowed    Spouse name: N/A  . Number of children: N/A  . Years of education: N/A   Occupational History  . Not on file.   Social History Main Topics  . Smoking status: Never Smoker  . Smokeless tobacco: Never Used  . Alcohol use No  . Drug use: No  . Sexual activity: Not on file   Other Topics Concern  . Not on file   Social History Narrative  . No narrative on file     Review of Systems: not reliable  Gen:  HEENT:  CV:  Resp:  GI: GU :  MS:  Derm:   Psych: Heme:  Neuro:  Endocrine  Vital Signs: Blood pressure (!) 124/58, pulse 88, temperature 97.7 F (36.5 C), temperature source Oral, resp. rate 20, height 5\' 3"  (1.6 m), weight 61.3 kg (135 lb 1.6 oz), SpO2 97 %.   Intake/Output Summary (Last 24 hours) at 02/27/17 1536 Last data  filed at 02/27/17 1100  Gross per 24 hour  Intake          1977.28 ml  Output               10 ml  Net          1967.28 ml    Weight trends: Filed Weights   02/26/17 1714 02/26/17 2325 02/27/17 0407  Weight: 63.5 kg (140 lb) 61.9 kg (136 lb 8 oz) 61.3 kg (135 lb 1.6 oz)    Physical Exam: General:  no acute distress, laying in the bed  HEENT Left periorbital hematoma,moist oral mucous membranes  Neck:  supple  Lungs: Bilateral diffuse crackles and wheezing  Heart:: Irregular, soft systolic murmur  Abdomen: Soft, nontender, nondistended  Extremities:  no edema  Neurologic: Alert, able to follow a few simple commands  Skin: Scattered bruises             Lab results: Basic Metabolic Panel:  Recent Labs Lab 02/26/17 1807 02/27/17 0529  NA 137 141  K 5.4* 4.8  CL 106 113*  CO2 22 23  GLUCOSE 89 86  BUN 34* 33*  CREATININE 3.17* 2.98*  CALCIUM 8.4* 8.0*    Liver Function Tests:  No results for input(s): AST, ALT, ALKPHOS, BILITOT, PROT, ALBUMIN in the last 168 hours. No results for input(s): LIPASE, AMYLASE in the last 168 hours. No results for input(s): AMMONIA in the last 168 hours.  CBC:  Recent Labs Lab 02/26/17 1807 02/27/17 0529  WBC 6.0 4.9  NEUTROABS 4.6  --   HGB 8.1* 7.6*  HCT 24.6* 22.9*  MCV 88.0 87.6  PLT 111* 99*    Cardiac Enzymes:  Recent Labs Lab 02/27/17 1107  TROPONINI 0.03*    BNP: Invalid input(s): POCBNP  CBG:  Recent Labs Lab 02/27/17 0744  GLUCAP 72    Microbiology: No results found for this or any previous visit (from the past 720 hour(s)).   Coagulation Studies: No results for input(s): LABPROT, INR in the last 72 hours.  Urinalysis:  Recent Labs  02/26/17 1713  COLORURINE YELLOW*  LABSPEC 1.014  PHURINE 5.0  GLUCOSEU NEGATIVE  HGBUR NEGATIVE  BILIRUBINUR NEGATIVE  KETONESUR NEGATIVE  PROTEINUR NEGATIVE  NITRITE NEGATIVE  LEUKOCYTESUR NEGATIVE        Imaging: Dg Chest 1 View  Result Date:  02/26/2017 CLINICAL DATA:  Unwitnessed fall today. EXAM: CHEST 1 VIEW COMPARISON:  Radiographs of January 30, 2017. FINDINGS: Stable cardiomegaly. No pneumothorax is noted. Stable mild central pulmonary vascular congestion is noted. Bony thorax is unremarkable. Minimal right pleural effusion is noted which is significantly improved compared to prior exam. Mild bibasilar subsegmental atelectasis is noted. IMPRESSION: Stable cardiomegaly and mild central pulmonary vascular congestion. Mild bibasilar subsegmental atelectasis is noted, with minimal right pleural effusion which is improved compared to prior exam. Electronically Signed   By: Lupita Raider, M.D.   On: 02/26/2017 08:47   Dg Pelvis 1-2 Views  Result Date: 02/26/2017 CLINICAL DATA:  Fall. EXAM: PELVIS - 1-2 VIEW COMPARISON:  None. FINDINGS: There is no evidence of pelvic fracture or diastasis. No pelvic bone lesions are seen. IMPRESSION: No definite abnormality seen in the pelvis. Electronically Signed   By: Lupita Raider, M.D.   On: 02/26/2017 08:49   Ct Head Wo Contrast  Result Date: 02/26/2017 CLINICAL DATA:  Fall, head injury, neck pain, facial swelling EXAM: CT HEAD WITHOUT CONTRAST CT MAXILLOFACIAL WITHOUT CONTRAST CT CERVICAL SPINE WITHOUT CONTRAST TECHNIQUE: Multidetector CT imaging of the head, cervical spine, and maxillofacial structures were performed using the standard protocol without intravenous contrast. Multiplanar CT image reconstructions of the cervical spine and maxillofacial structures were also generated. COMPARISON:  01/30/2017 FINDINGS: CT HEAD FINDINGS Brain: There is atrophy and chronic small vessel disease changes. No acute intracranial abnormality. Specifically, no hemorrhage, hydrocephalus, mass lesion, acute infarction, or significant intracranial injury. Vascular: No hyperdense vessel or unexpected calcification. Skull: No acute calvarial abnormality. Other: None CT MAXILLOFACIAL FINDINGS Osseous: Patient motion limits.  While it a despite repeating study. No visible facial fracture. Orbits: Negative. No traumatic or inflammatory finding. Sinuses: Clear. Soft tissues: Soft tissue swelling over the left face and lateral orbit CT CERVICAL SPINE FINDINGS Alignment: Normal Skull base and vertebrae: No fracture. Soft tissues and spinal canal: No epidural or paraspinal hematoma. Prevertebral soft tissues are normal. Disc levels: Disc space narrowing to mid and lower cervical spine. Degenerative facet disease bilaterally. Upper chest: Moderate bilateral pleural effusions are noted. Airspace opacity anteriorly in the right upper lobe. Other: None IMPRESSION: Atrophy, chronic small vessel disease. No acute intracranial abnormality. No evidence of facial or orbital fracture. No acute bony abnormality in the cervical spine.  Spondylosis. Moderate bilateral pleural effusions image in the  upper chest. Patchy opacity anteriorly in the right upper lobe concerning for pneumonia, less likely pulmonary nodule. Electronically Signed   By: Charlett Nose M.D.   On: 02/26/2017 08:46   Ct Cervical Spine Wo Contrast  Result Date: 02/26/2017 CLINICAL DATA:  Fall, head injury, neck pain, facial swelling EXAM: CT HEAD WITHOUT CONTRAST CT MAXILLOFACIAL WITHOUT CONTRAST CT CERVICAL SPINE WITHOUT CONTRAST TECHNIQUE: Multidetector CT imaging of the head, cervical spine, and maxillofacial structures were performed using the standard protocol without intravenous contrast. Multiplanar CT image reconstructions of the cervical spine and maxillofacial structures were also generated. COMPARISON:  01/30/2017 FINDINGS: CT HEAD FINDINGS Brain: There is atrophy and chronic small vessel disease changes. No acute intracranial abnormality. Specifically, no hemorrhage, hydrocephalus, mass lesion, acute infarction, or significant intracranial injury. Vascular: No hyperdense vessel or unexpected calcification. Skull: No acute calvarial abnormality. Other: None CT MAXILLOFACIAL  FINDINGS Osseous: Patient motion limits. While it a despite repeating study. No visible facial fracture. Orbits: Negative. No traumatic or inflammatory finding. Sinuses: Clear. Soft tissues: Soft tissue swelling over the left face and lateral orbit CT CERVICAL SPINE FINDINGS Alignment: Normal Skull base and vertebrae: No fracture. Soft tissues and spinal canal: No epidural or paraspinal hematoma. Prevertebral soft tissues are normal. Disc levels: Disc space narrowing to mid and lower cervical spine. Degenerative facet disease bilaterally. Upper chest: Moderate bilateral pleural effusions are noted. Airspace opacity anteriorly in the right upper lobe. Other: None IMPRESSION: Atrophy, chronic small vessel disease. No acute intracranial abnormality. No evidence of facial or orbital fracture. No acute bony abnormality in the cervical spine.  Spondylosis. Moderate bilateral pleural effusions image in the upper chest. Patchy opacity anteriorly in the right upper lobe concerning for pneumonia, less likely pulmonary nodule. Electronically Signed   By: Charlett Nose M.D.   On: 02/26/2017 08:46   US Renal  Result Date: 02/27/2017 CLINICAL DATA:  Acute on chronic renal failure EXAM: RENAL / URINARY TRACT ULTRASOUND COMPLETE COMPARISON:  None. FINDINGS: Right Kidney: Length: 8.9 cm. Increased echogenicity is noted. A 3.0 cm cyst is noted in the upper pole of the right kidney. A smaller adjacent cyst is noted as well as small cysts in the lower pole. Left Kidney: Length: 8.3 cm.  2.1 cm lower pole cyst in the left kidney. Bladder: Decompressed IMPRESSION: Bilateral renal cysts. Electronically Signed   By: Alcide Clever M.D.   On: 02/27/2017 12:43   Dg Hip Unilat W Or Wo Pelvis 2-3 Views Right  Result Date: 02/26/2017 CLINICAL DATA:  Right hip pain after fall. EXAM: DG HIP (WITH OR WITHOUT PELVIS) 2-3V RIGHT COMPARISON:  None. FINDINGS: There is no evidence of hip fracture or dislocation. There is no evidence of arthropathy  or other focal bone abnormality. IMPRESSION: Normal right hip. Electronically Signed   By: Lupita Raider, M.D.   On: 02/26/2017 08:50   Ct Maxillofacial Wo Contrast  Result Date: 02/26/2017 CLINICAL DATA:  Fall, head injury, neck pain, facial swelling EXAM: CT HEAD WITHOUT CONTRAST CT MAXILLOFACIAL WITHOUT CONTRAST CT CERVICAL SPINE WITHOUT CONTRAST TECHNIQUE: Multidetector CT imaging of the head, cervical spine, and maxillofacial structures were performed using the standard protocol without intravenous contrast. Multiplanar CT image reconstructions of the cervical spine and maxillofacial structures were also generated. COMPARISON:  01/30/2017 FINDINGS: CT HEAD FINDINGS Brain: There is atrophy and chronic small vessel disease changes. No acute intracranial abnormality. Specifically, no hemorrhage, hydrocephalus, mass lesion, acute infarction, or significant intracranial injury. Vascular: No hyperdense vessel or unexpected calcification. Skull: No  acute calvarial abnormality. Other: None CT MAXILLOFACIAL FINDINGS Osseous: Patient motion limits. While it a despite repeating study. No visible facial fracture. Orbits: Negative. No traumatic or inflammatory finding. Sinuses: Clear. Soft tissues: Soft tissue swelling over the left face and lateral orbit CT CERVICAL SPINE FINDINGS Alignment: Normal Skull base and vertebrae: No fracture. Soft tissues and spinal canal: No epidural or paraspinal hematoma. Prevertebral soft tissues are normal. Disc levels: Disc space narrowing to mid and lower cervical spine. Degenerative facet disease bilaterally. Upper chest: Moderate bilateral pleural effusions are noted. Airspace opacity anteriorly in the right upper lobe. Other: None IMPRESSION: Atrophy, chronic small vessel disease. No acute intracranial abnormality. No evidence of facial or orbital fracture. No acute bony abnormality in the cervical spine.  Spondylosis. Moderate bilateral pleural effusions image in the upper  chest. Patchy opacity anteriorly in the right upper lobe concerning for pneumonia, less likely pulmonary nodule. Electronically Signed   By: Charlett Nose M.D.   On: 02/26/2017 08:46      Assessment & Plan: Pt is a 83 y.o.  female with medical problems of Parkinson's disease, dementia,stroke, depression, pulmonary hypertension, who was admitted to [facility] on 02/26/2017 for evaluation of mental status changes status post fall.    1. Acute renal failure. Unclear cause but likely from hemodynamic instability  Patient does not appear to be volume depleted.  No need for IV fluids especially in light of pulmonary hypertension Renal ultrasound is negative for obstruction.  Simple renal cysts were noted Maintain hemodynamic stability Avoid hypotension Avoid nephrotoxic agents  2. Chronic kidney disease stage IV - baseline creatinine 1.82/GFR 25 Underlying CKD is likely secondary to atherosclerosis We'll continue to monitor

## 2017-02-27 NOTE — Progress Notes (Signed)
[facility] - [place] at [facility]   PATIENT NAME: Stacey Baker    MR#:  [id number]  DATE OF BIRTH:  04/27/1932  SUBJECTIVE:  CHIEF COMPLAINT:   Chief Complaint  Patient presents with  . Urinary Frequency   Patient is 83 year old Caucasian female with past medical history significant for history of CAD stage III, pericardial, pleural effusions, chronic diastolic CHF, seizures, Parkinson's disease, memory loss, aphasia, who presents to the hospital with confusion. She was noted to have worsening renal failure with creatinine level of 3.2, up from 1.8 baseline. She was also noted to be anemic, she was noted to have slightly positive Hemoccult of stool. She was hydrated on admission, her creatinine level has improved, however hemoglobin level was found to be low at 7.9, no active bleeding was noted. Patient denies any pain Review of Systems  Unable to perform ROS: Dementia    VITAL SIGNS: Blood pressure (!) 124/58, pulse 88, temperature 97.7 F (36.5 C), temperature source Oral, resp. rate 20, height 5\' 3"  (1.6 m), weight 61.3 kg (135 lb 1.6 oz), SpO2 97 %.  PHYSICAL EXAMINATION:   GENERAL:  83 y.o.-year-old patient lying in the bed with no acute distress, babbling, difficult to understand.  EYES: Pupils equal, round, reactive to light and accommodation. No scleral icterus. Extraocular muscles intact.  HEENT: Head atraumatic, normocephalic. Oropharynx and nasopharynx clear. Bruising of her left periorbital area NECK:  Supple, no jugular venous distention. No thyroid enlargement, no tenderness.  LUNGS: Normal breath sounds bilaterally, no wheezing, rales,rhonchi or crepitation. No use of accessory muscles of respiration.  CARDIOVASCULAR: S1, S2 normal. No murmurs, rubs, or gallops.  ABDOMEN: Soft, nontender, nondistended. Bowel sounds present. No organomegaly or mass.  EXTREMITIES: No pedal edema, cyanosis, or clubbing.  NEUROLOGIC: Cranial nerves II through  XII are intact. Muscle strength 5/5 in all extremities. Sensation intact. Gait not checked.  PSYCHIATRIC: The patient is alert, difficult to assess her orientationash, lesion, or ulcer.   ORDERS/RESULTS REVIEWED:   CBC  Recent Labs Lab 02/26/17 1807 02/27/17 0529  WBC 6.0 4.9  HGB 8.1* 7.6*  HCT 24.6* 22.9*  PLT 111* 99*  MCV 88.0 87.6  MCH 28.8 29.0  MCHC 32.8 33.1  RDW 16.5* 16.9*  LYMPHSABS 0.7*  --   MONOABS 0.5  --   EOSABS 0.0  --   BASOSABS 0.1  --    ------------------------------------------------------------------------------------------------------------------  Chemistries   Recent Labs Lab 02/26/17 1807 02/27/17 0529  NA 137 141  K 5.4* 4.8  CL 106 113*  CO2 22 23  GLUCOSE 89 86  BUN 34* 33*  CREATININE 3.17* 2.98*  CALCIUM 8.4* 8.0*   ------------------------------------------------------------------------------------------------------------------ estimated creatinine clearance is 12 mL/min (A) (by C-G formula based on SCr of 2.98 mg/dL (H)). ------------------------------------------------------------------------------------------------------------------ No results for input(s): TSH, T4TOTAL, T3FREE, THYROIDAB in the last 72 hours.  Invalid input(s): FREET3  Cardiac Enzymes  Recent Labs Lab 02/26/17 2359 02/27/17 0529 02/27/17 1107  TROPONINI <0.03 0.03* 0.03*   ------------------------------------------------------------------------------------------------------------------ Invalid input(s): POCBNP ---------------------------------------------------------------------------------------------------------------  RADIOLOGY: Dg Chest 1 View  Result Date: 02/26/2017 CLINICAL DATA:  Unwitnessed fall today. EXAM: CHEST 1 VIEW COMPARISON:  Radiographs of January 30, 2017. FINDINGS: Stable cardiomegaly. No pneumothorax is noted. Stable mild central pulmonary vascular congestion is noted. Bony thorax is unremarkable. Minimal right pleural effusion is  noted which is significantly improved compared to prior exam. Mild bibasilar subsegmental atelectasis is noted. IMPRESSION: Stable cardiomegaly and mild central pulmonary vascular congestion. Mild bibasilar subsegmental atelectasis is  noted, with minimal right pleural effusion which is improved compared to prior exam. Electronically Signed   By: Lupita Raider, M.D.   On: 02/26/2017 08:47   Dg Pelvis 1-2 Views  Result Date: 02/26/2017 CLINICAL DATA:  Fall. EXAM: PELVIS - 1-2 VIEW COMPARISON:  None. FINDINGS: There is no evidence of pelvic fracture or diastasis. No pelvic bone lesions are seen. IMPRESSION: No definite abnormality seen in the pelvis. Electronically Signed   By: Lupita Raider, M.D.   On: 02/26/2017 08:49   Ct Head Wo Contrast  Result Date: 02/26/2017 CLINICAL DATA:  Fall, head injury, neck pain, facial swelling EXAM: CT HEAD WITHOUT CONTRAST CT MAXILLOFACIAL WITHOUT CONTRAST CT CERVICAL SPINE WITHOUT CONTRAST TECHNIQUE: Multidetector CT imaging of the head, cervical spine, and maxillofacial structures were performed using the standard protocol without intravenous contrast. Multiplanar CT image reconstructions of the cervical spine and maxillofacial structures were also generated. COMPARISON:  01/30/2017 FINDINGS: CT HEAD FINDINGS Brain: There is atrophy and chronic small vessel disease changes. No acute intracranial abnormality. Specifically, no hemorrhage, hydrocephalus, mass lesion, acute infarction, or significant intracranial injury. Vascular: No hyperdense vessel or unexpected calcification. Skull: No acute calvarial abnormality. Other: None CT MAXILLOFACIAL FINDINGS Osseous: Patient motion limits. While it a despite repeating study. No visible facial fracture. Orbits: Negative. No traumatic or inflammatory finding. Sinuses: Clear. Soft tissues: Soft tissue swelling over the left face and lateral orbit CT CERVICAL SPINE FINDINGS Alignment: Normal Skull base and vertebrae: No fracture.  Soft tissues and spinal canal: No epidural or paraspinal hematoma. Prevertebral soft tissues are normal. Disc levels: Disc space narrowing to mid and lower cervical spine. Degenerative facet disease bilaterally. Upper chest: Moderate bilateral pleural effusions are noted. Airspace opacity anteriorly in the right upper lobe. Other: None IMPRESSION: Atrophy, chronic small vessel disease. No acute intracranial abnormality. No evidence of facial or orbital fracture. No acute bony abnormality in the cervical spine.  Spondylosis. Moderate bilateral pleural effusions image in the upper chest. Patchy opacity anteriorly in the right upper lobe concerning for pneumonia, less likely pulmonary nodule. Electronically Signed   By: Charlett Nose M.D.   On: 02/26/2017 08:46   Ct Cervical Spine Wo Contrast  Result Date: 02/26/2017 CLINICAL DATA:  Fall, head injury, neck pain, facial swelling EXAM: CT HEAD WITHOUT CONTRAST CT MAXILLOFACIAL WITHOUT CONTRAST CT CERVICAL SPINE WITHOUT CONTRAST TECHNIQUE: Multidetector CT imaging of the head, cervical spine, and maxillofacial structures were performed using the standard protocol without intravenous contrast. Multiplanar CT image reconstructions of the cervical spine and maxillofacial structures were also generated. COMPARISON:  01/30/2017 FINDINGS: CT HEAD FINDINGS Brain: There is atrophy and chronic small vessel disease changes. No acute intracranial abnormality. Specifically, no hemorrhage, hydrocephalus, mass lesion, acute infarction, or significant intracranial injury. Vascular: No hyperdense vessel or unexpected calcification. Skull: No acute calvarial abnormality. Other: None CT MAXILLOFACIAL FINDINGS Osseous: Patient motion limits. While it a despite repeating study. No visible facial fracture. Orbits: Negative. No traumatic or inflammatory finding. Sinuses: Clear. Soft tissues: Soft tissue swelling over the left face and lateral orbit CT CERVICAL SPINE FINDINGS Alignment:  Normal Skull base and vertebrae: No fracture. Soft tissues and spinal canal: No epidural or paraspinal hematoma. Prevertebral soft tissues are normal. Disc levels: Disc space narrowing to mid and lower cervical spine. Degenerative facet disease bilaterally. Upper chest: Moderate bilateral pleural effusions are noted. Airspace opacity anteriorly in the right upper lobe. Other: None IMPRESSION: Atrophy, chronic small vessel disease. No acute intracranial abnormality. No evidence of facial or  orbital fracture. No acute bony abnormality in the cervical spine.  Spondylosis. Moderate bilateral pleural effusions image in the upper chest. Patchy opacity anteriorly in the right upper lobe concerning for pneumonia, less likely pulmonary nodule. Electronically Signed   By: Charlett Nose M.D.   On: 02/26/2017 08:46   US Renal  Result Date: 02/27/2017 CLINICAL DATA:  Acute on chronic renal failure EXAM: RENAL / URINARY TRACT ULTRASOUND COMPLETE COMPARISON:  None. FINDINGS: Right Kidney: Length: 8.9 cm. Increased echogenicity is noted. A 3.0 cm cyst is noted in the upper pole of the right kidney. A smaller adjacent cyst is noted as well as small cysts in the lower pole. Left Kidney: Length: 8.3 cm.  2.1 cm lower pole cyst in the left kidney. Bladder: Decompressed IMPRESSION: Bilateral renal cysts. Electronically Signed   By: Alcide Clever M.D.   On: 02/27/2017 12:43   Dg Hip Unilat W Or Wo Pelvis 2-3 Views Right  Result Date: 02/26/2017 CLINICAL DATA:  Right hip pain after fall. EXAM: DG HIP (WITH OR WITHOUT PELVIS) 2-3V RIGHT COMPARISON:  None. FINDINGS: There is no evidence of hip fracture or dislocation. There is no evidence of arthropathy or other focal bone abnormality. IMPRESSION: Normal right hip. Electronically Signed   By: Lupita Raider, M.D.   On: 02/26/2017 08:50   Ct Maxillofacial Wo Contrast  Result Date: 02/26/2017 CLINICAL DATA:  Fall, head injury, neck pain, facial swelling EXAM: CT HEAD WITHOUT  CONTRAST CT MAXILLOFACIAL WITHOUT CONTRAST CT CERVICAL SPINE WITHOUT CONTRAST TECHNIQUE: Multidetector CT imaging of the head, cervical spine, and maxillofacial structures were performed using the standard protocol without intravenous contrast. Multiplanar CT image reconstructions of the cervical spine and maxillofacial structures were also generated. COMPARISON:  01/30/2017 FINDINGS: CT HEAD FINDINGS Brain: There is atrophy and chronic small vessel disease changes. No acute intracranial abnormality. Specifically, no hemorrhage, hydrocephalus, mass lesion, acute infarction, or significant intracranial injury. Vascular: No hyperdense vessel or unexpected calcification. Skull: No acute calvarial abnormality. Other: None CT MAXILLOFACIAL FINDINGS Osseous: Patient motion limits. While it a despite repeating study. No visible facial fracture. Orbits: Negative. No traumatic or inflammatory finding. Sinuses: Clear. Soft tissues: Soft tissue swelling over the left face and lateral orbit CT CERVICAL SPINE FINDINGS Alignment: Normal Skull base and vertebrae: No fracture. Soft tissues and spinal canal: No epidural or paraspinal hematoma. Prevertebral soft tissues are normal. Disc levels: Disc space narrowing to mid and lower cervical spine. Degenerative facet disease bilaterally. Upper chest: Moderate bilateral pleural effusions are noted. Airspace opacity anteriorly in the right upper lobe. Other: None IMPRESSION: Atrophy, chronic small vessel disease. No acute intracranial abnormality. No evidence of facial or orbital fracture. No acute bony abnormality in the cervical spine.  Spondylosis. Moderate bilateral pleural effusions image in the upper chest. Patchy opacity anteriorly in the right upper lobe concerning for pneumonia, less likely pulmonary nodule. Electronically Signed   By: Charlett Nose M.D.   On: 02/26/2017 08:46    EKG:  Orders placed or performed during the hospital encounter of 02/26/17  . ED EKG  . ED EKG     ASSESSMENT AND PLAN:  Active Problems:   Generalized weakness   Acute on chronic renal failure (HCC)   Anemia   Guaiac positive stools   Fall  #1. Acute on chronic renal failure, as well as residual was only 10 cc, improved with IV fluid administration, nephrologist consultation is obtained, renal ultrasound revealed bilateral renal cysts, discussed with Dr. Thedore Mins. Urinalysis was unremarkable, unlikely  UTI   #2. Elevated troponin, likely demand ischemia, get echocardiogram #3. Hyperkalemia, resolved with Kayexalate and improvement of kidney function  #4. Generalized weakness, fall,  physical therapist saw patient in consultation and recommended 24-hour supervision/assistance, speech therapist evaluate the patient, recommended dysphagia 3 diet with thin liquids, aspiration precautions  #5. Hemoccult positive stool, anemia, get iron studies, ferritin, suspect chronic blood loss anemia and anemia of chronic disease due to renal failure, gastroenterologist consultation is requested, continue Protonix  #7. Confusion, etiology is unclear, get neurologist involved  Management plans discussed with the patient, family and they are in agreement.   DRUG ALLERGIES:  Allergies  Allergen Reactions  . Penicillins Other (See Comments)    Reaction:  Unknown   . Plavix [Clopidogrel Bisulfate] Other (See Comments)    Reaction:  Unknown     CODE STATUS:     Code Status Orders        Start     Ordered   02/26/17 2326  Do not attempt resuscitation (DNR)  Continuous    Question Answer Comment  In the event of cardiac or respiratory ARREST Do not call a "code blue"   In the event of cardiac or respiratory ARREST Do not perform Intubation, CPR, defibrillation or ACLS   In the event of cardiac or respiratory ARREST Use medication by any route, position, wound care, and other measures to relive pain and suffering. May use oxygen, suction and manual treatment of airway obstruction as needed for  comfort.      02/26/17 2325    Code Status History    Date Active Date Inactive Code Status Order ID Comments User Context   07/05/2016  5:47 PM 07/09/2016  7:51 PM Full Code [id number]  Marguarite Arbour, MD ED   07/03/2016 11:04 AM 07/03/2016  5:26 PM Full Code [id number]  Milagros Loll, MD ED   12/31/2015  5:42 PM 01/06/2016  7:12 PM Full Code [id number]  [name], MD Inpatient   09/13/2015 10:55 PM 09/15/2015  8:25 PM Full Code [id number]  Oralia Manis, MD Inpatient   08/28/2015  8:14 AM 08/29/2015  4:49 PM DNR [id number]  Delfino Lovett, MD Inpatient   08/27/2015 11:25 PM 08/28/2015  8:14 AM Full Code [id number]  Hower, Cletis [place], MD ED    Advance Directive Documentation     Most Recent Value  Type of Advance Directive  Out of facility DNR (pink MOST or yellow form)  Pre-existing out of facility DNR order (yellow form or pink MOST form)  -  "MOST" Form in Place?  -      TOTAL TIME TAKING CARE OF THIS PATIEN57minutes.    Katharina Caper M.D on 02/27/2017 at 3:37 PM  Between 7am to 6pm - Pager - [phone]  After 6pm go to [email or web address] - password [email or web address] [facility]  [facility]  Office  [phone]  CC: Primary care physician; Merlene Pulling, PA-C

## 2017-02-27 NOTE — Evaluation (Signed)
Occupational Therapy Evaluation Patient Details Name: Stacey Baker MRN: [id number] DOB: March 25, 1932 Today's Date: 02/27/2017    History of Present Illness Pt. is an 83 y.o. female who was admitted to [facility] with Acute on Chronic Renal Failure. PMHx includes: CAD stage III, pleural effusion, chronis dialstolic CHF, seizures, Parkinson's Disease, seizures, Memory Loss, and Aphasia.   Clinical Impression   Pt. Is [name] who was admitted to [facility] with Acute on Chronic Renal Failure. Pt. Is frail, and has multiple medical issues. Pt. Currently has mitt restraints in place, confusion, weakness, impaired functional mobility, and decreased activity tolerance. Pt. Could benefit from skilled OT services to improve pt. Engagement in consistent simple, one step basic hand to face  self-care tasks for feeding, and grooming. Pt.'s son would like to have pt. be able to return to [place] upon discharge, and to resume working with a therapist there that she is familiar with.     Follow Up Recommendations  SNF    Equipment Recommendations       Recommendations for Other Services       Precautions / Restrictions Precautions Precautions: Fall Restrictions Weight Bearing Restrictions: No                                                    ADL either performed or assessed with clinical judgement   ADL Overall ADL's : Needs assistance/impaired Eating/Feeding: Maximal assistance   Grooming: Maximal assistance   Upper Body Bathing: Total assistance   Lower Body Bathing: Total assistance   Upper Body Dressing : Total assistance   Lower Body Dressing: Total assistance   Toilet Transfer: Total assistance   Toileting- Clothing Manipulation and Hygiene: Total assistance         General ADL Comments: Limted consistency in follow through of simple one step commands.     Vision Baseline Vision/History:  (Difficult to assess secondary to cognition)        Perception     Praxis      Pertinent Vitals/Pain Pain Assessment: 0-10     Hand Dominance Right   Extremity/Trunk Assessment Upper Extremity Assessment Upper Extremity Assessment: Difficult to assess due to impaired cognition           Communication Communication Communication: Receptive difficulties;Expressive difficulties   Cognition Arousal/Alertness: Awake/alert Behavior During Therapy: Restless Overall Cognitive Status: Difficult to assess                                     General Comments       Exercises     Shoulder Instructions      Home Living Family/patient expects to be discharged to:: Assisted living                             Home Equipment: Wheelchair - manual   Additional Comments: Child psychotherapist Living      Prior Functioning/Environment Level of Independence: Needs assistance    ADL's / Homemaking Assistance Needed: W/c bound, required assist with ADLs.            OT Problem List: Decreased strength;Decreased range of motion;Decreased cognition;Decreased knowledge of use of DME or AE;Decreased knowledge of precautions;Decreased activity tolerance;Decreased safety awareness;Impaired balance (  sitting and/or standing)      OT Treatment/Interventions: Self-care/ADL training;Therapeutic exercise;Therapeutic activities;Energy conservation;Patient/family education;Cognitive remediation/compensation;DME and/or AE instruction    OT Goals(Current goals can be found in the care plan section) Acute Rehab OT Goals Patient Stated Goal: To be [facility] a therapist she has been seeing, and has an established good rapore, and familiarity with. OT Goal Formulation: With family Potential to Achieve Goals: Good  OT Frequency: Min 1X/week   Barriers to D/C:            Co-evaluation              AM-PAC PT "6 Clicks" Daily Activity     Outcome Measure Help from another  person eating meals?: Total Help from another person taking care of personal grooming?: Total Help from another person toileting, which includes using toliet, bedpan, or urinal?: Total Help from another person bathing (including washing, rinsing, drying)?: Total Help from another person to put on and taking off regular upper body clothing?: Total Help from another person to put on and taking off regular lower body clothing?: Total 6 Click Score: 6   End of Session    Activity Tolerance: Treatment limited secondary to medical complications (Comment) (Limited by cognition) Patient left: in bed;with call bell/phone within reach;with family/visitor present (son)  OT Visit Diagnosis: Muscle weakness (generalized) (M62.81)                Time: [id number] OT Time Calculation (min): 15 min Charges:  OT General Charges $OT Visit: 1 Procedure OT Evaluation $OT Eval Low Complexity: 1 Procedure G-Codes:     Olegario MessierElaine Mozes Sagar, MS, OTR/L   Olegario MessierElaine Jojuan Champney, MS, OTR/L 02/27/2017, 2:54 PM

## 2017-02-27 NOTE — Evaluation (Signed)
Clinical/Bedside Swallow Evaluation Patient Details  Name: Stacey ManchesterMiriam Obenchain MRN: [id number] Date of Birth: 07/14/1932  Today's Date: 02/27/2017 Time: SLP Start Time (ACUTE ONLY): 1130 SLP Stop Time (ACUTE ONLY): 1230 SLP Time Calculation (min) (ACUTE ONLY): 60 min  Past Medical History:  Past Medical History:  Diagnosis Date  . Apraxia following cerebral infarction   . CKD (chronic kidney disease), stage III    a. With acute worsening/AKI noted on several admissions.  . Dementia   . Dementia without behavioral disturbance   . Depression   . Encephalopathy   . Hyperkalemia    a. 08/2015  . Hyperlipidemia   . Hyperlipidemia   . Hypertension   . Iron deficiency anemia   . Major depressive disorder   . Muscle weakness   . Parkinson disease (HCC)   . Parkinson's disease (HCC)   . Pericardial effusion    a. 06/2016 Echo: Ef 60-65%, no rwma, Gr1 DD, mild MR, mildly dil LA, PASP 72mmHg, mod circumferential pericardial effusion - no hemodynamic compromise.  . Sinus bradycardia    a. 08/2015 in setting of beta blocker and hyperkalemia.  . Stroke (HCC)   . TIA (transient ischemic attack)    a. 08/2015 - essentially neg w/u for stroke.  Marland Kitchen. UTI (urinary tract infection)    Past Surgical History:  Past Surgical History:  Procedure Laterality Date  . ABDOMINAL HYSTERECTOMY    . APPENDECTOMY    . CHOLECYSTECTOMY     HPI:  Pt is a 83 y.o. female with a known history of CAD stage III, peripheral effusion, pleural effusion, chronic diastolic CHF, seizures, Parkinson's Disease, Dementia, TIA, and aphasia, who presents to the hospital with confusion. Patient fell down earlier today and was seen by emergency room physician, who discharged her to the facility after evaluation. She comes back due to worsening confusion. Her labs revealed worsening kidney failure with creatinine level up to 3.17 from 1.8 baseline, she was also noted to be more anemic with hemoglobin level drifting down to 8.1 from 10.5  in April 2018. Potassium was found to be elevated. Son indicated pt does eat softer foods "better" but denied any overt coughing during meals. She has become more dependent on assistance eating at meals per report. Pt is wearing Mitts for restraints.   Assessment / Plan / Recommendation Clinical Impression  Pt appears to present w/ mild oropharyngeal phase dysphagia w/ min increased risk for aspiration. When following aspiration precautions and being monitored w/ oral intake, pt's risk appears reduced. Pt consumed trials of thin liquids via straw(her Choice and preference baseline) and trials of puree/minced foods w/ inconsistent, mild throat clearing noted - delayed the majority of trials. Impacting factore include she often began talking upon completion of the swallow or during masticating food boluses, impulsive when drinking requiring Pinching of the straw to limit bolus amounts, easily distracted. Pt also requires full feeding assistance at this time. [name] throat clearing did not increase in severity during po trials. Oral phase c/b min slower clearing d/t increased mastication attention w/ increased textures; timely swallowing/clearing w/ liquids. Due to pt's increased risks at this time, recommend a dysphagia level 3(w/ minced meats), thin liquids; strict aspiration precautions; Pills in puree - crushed; full feeding assistance at meals and monitoring when drinking. Recomment dietician f/u - education given on supplements. NSG staff to monitor pt's Pulmonary status for any negative sequelae from potential aspiration; consult ST services for assessment and need/option to modify diet to a more conservative consistency(liquids), education.  SLP Visit Diagnosis: Dysphagia, oropharyngeal phase (R13.12)    Aspiration Risk  Mild aspiration risk    Diet Recommendation  Dysphagia level 3 w/ minced/cut meats and gravy; thin liquids - STRICT aspiration precautions and feeding support at meals. MUST monitor  impulsive drinking via straw and PINCH straw to limit bolus amount  Medication Administration: Crushed with puree    Other  Recommendations Recommended Consults:  (dietician f/u - education given on supplements) Oral Care Recommendations: Oral care BID;Staff/trained caregiver to provide oral care   Follow up Recommendations Skilled Nursing facility (TBD)      Frequency and Duration min 3x week  2 weeks       Prognosis Prognosis for Safe Diet Advancement: Fair Barriers to Reach Goals: Cognitive deficits;Severity of deficits      Swallow Study   General Date of Onset: 02/26/17 HPI: Pt is a 83 y.o. female with a known history of CAD stage III, peripheral effusion, pleural effusion, chronic diastolic CHF, seizures, Parkinson's Disease, Dementia, TIA, and aphasia, who presents to the hospital with confusion. Patient fell down earlier today and was seen by emergency room physician, who discharged her to the facility after evaluation. She comes back due to worsening confusion. Her labs revealed worsening kidney failure with creatinine level up to 3.17 from 1.8 baseline, she was also noted to be more anemic with hemoglobin level drifting down to 8.1 from 10.5 in April 2018. Potassium was found to be elevated. Son indicated pt does eat softer foods "better" but denied any overt coughing during meals. She has become more dependent on assistance eating at meals per report. Pt is wearing Mitts for restraints. Type of Study: Bedside Swallow Evaluation Previous Swallow Assessment: none indicated Diet Prior to this Study: Regular;Thin liquids Temperature Spikes Noted: No (wbc 4.9;  CXR reviewed - improved) Respiratory Status: Nasal cannula (2 liters) History of Recent Intubation: No Behavior/Cognition: Alert;Cooperative;Pleasant mood;Confused;Distractible;Requires cueing (constant mumbled talking) Oral Cavity Assessment: Dry Oral Care Completed by SLP: Recent completion by staff Oral Cavity -  Dentition: Missing dentition Vision:  (impaired attention to feed self) Self-Feeding Abilities: Total assist Patient Positioning: Upright in bed Baseline Vocal Quality: Low vocal intensity (mumbled) Volitional Cough: Cognitively unable to elicit Volitional Swallow: Unable to elicit    Oral/Motor/Sensory Function Overall Oral Motor/Sensory Function:  (fair w/ bolus management; Cognitive decline to follow)   Ice Chips Ice chips: Not tested   Thin Liquid Thin Liquid: Impaired Presentation: Straw (straw use is baseline for pt per Son; 10 trials) Oral Phase Impairments: Poor awareness of bolus Oral Phase Functional Implications: Prolonged oral transit Pharyngeal  Phase Impairments: Throat Clearing - Delayed (intermittent but not consistent post trials) Other Comments: she often began talking upon completion of the swallow then throat cleared; frequent mumbled talking; impulsive when drinking    Nectar Thick Nectar Thick Liquid: Not tested   Honey Thick Honey Thick Liquid: Not tested   Puree Puree: Within functional limits Presentation: Spoon (fed; 6 trials) Other Comments: grossly wfl   Solid   GO   Solid: Impaired (minced foods - 6 trials) Presentation: Spoon (fed; 6 trials) Oral Phase Impairments: Impaired mastication (min increased time/effort) Oral Phase Functional Implications: Impaired mastication (min ) Pharyngeal Phase Impairments: Throat Clearing - Delayed (x1) Other Comments: often began talking while chewing foods; education given         Jerilynn Som, MS, CCC-SLP [place] 02/27/2017,3:01 PM

## 2017-02-27 NOTE — Consult Note (Signed)
WOC Nurse wound consult note Reason for Consult:  Injury (abrasions) to first and second finger, present on admission. Patient has long, painted nails.  Hand mitts on for safety,.  Mitts are removed at this time for assessment and re applied.  Patient is confused.  Wound type: Trauma  Unknown etiology Pressure Injury POA: N/A Measurement: 1 cm lesion to right first and second finger.  Wound JXB:JYNWGNFbed:scabbed Drainage (amount, consistency, odor) minimal serosanguinous Periwound:intact Dressing procedure/placement/frequency:Cleanse wounds to right fingers with NS and pat gently dry.  Apply Bactroban to wound bed. Cover with 2x2 and gauze/tape.  Change daily.  Will not follow at this time.  Please re-consult if needed.  Maple HudsonKaren Aldridge Krzyzanowski RN BSN CWON Pager [phone]

## 2017-02-27 NOTE — Clinical Social Work Note (Signed)
Clinical Social Work Assessment  Patient Details  Name: Glorianne ManchesterMiriam Amore MRN: [id number] Date of Birth: 05/13/1932  Date of referral:  02/27/17               Reason for consult:  Facility Placement                Permission sought to share information with:    Permission granted to share information::     Name::        Agency::     Relationship::     Contact Information:     Housing/Transportation Living arrangements for the past 2 months:  Assisted Living Facility Source of Information:  Adult Children Patient Interpreter Needed:  None Criminal Activity/Legal Involvement Pertinent to Current Situation/Hospitalization:  No - Comment as needed Significant Relationships:  Adult Children Lives with:  Facility Resident Do you feel safe going back to the place where you live?  Yes Need for family participation in patient care:  Yes (Comment)  Care giving concerns:  [facility].   Social Worker assessment / plan:  CSW contacted the Higher education careers adviserClinical Coordinator at [facility]: Lisa: [phone]. [name] stated that patient is at baseline alert and oriented X3 and able to ambulate small distances. [name] stated that they will need to come and reassess her prior to discharge.  CSW went to see patient and her son: Cammy CopaCharles Gaughan [phone]). Patient is very confused and unable to participate in any type of meaningful conversation. Mr. Irene ShipperReier states that this is not her baseline. CSW spent much time providing supportive listening surrounding patient's current situation. Mr. Irene ShipperReier states patient [facility]. She has been to [facility] in the past but it was not in this area. CSW explained that [name] at [facility] stated they would have to come reassess her prior to discharge. FL2 started but not sent for MD signature yet.   Employment status:  Retired Health and safety inspectornsurance information:  Medicare PT Recommendations:  24 Hour Supervision Information / Referral to  community resources:     Patient/Family's Response to care:  [name] expressed appreciation for CSW assistance and visit.  Patient/Family's Understanding of and Emotional Response to Diagnosis, Current Treatment, and Prognosis:  [name] is very knowledgeable about his mother's condition and knows that when she becomes confused it usually means she has an infection.  Emotional Assessment Appearance:  Appears stated age Attitude/Demeanor/Rapport:  Unable to Assess Affect (typically observed):  Unable to Assess Orientation:    Alcohol / Substance use:  Not Applicable Psych involvement (Current and /or in the community):  No (Comment)  Discharge Needs  Concerns to be addressed:  Care Coordination Readmission within the last 30 days:  No Current discharge risk:  None Barriers to Discharge:  No Barriers Identified   York SpanielMonica [employer], LCSW 02/27/2017, 2:33 PM

## 2017-02-27 NOTE — Evaluation (Signed)
Physical Therapy Evaluation Patient Details Name: Stacey ManchesterMiriam Baker MRN: [id number] DOB: 07/30/1932 Today's Date: 02/27/2017   History of Present Illness  83 y.o. female with a known history of CAD stage III, peripheral effusion, pleural effusion, chronic diastolic CHF, seizures, Parkinson's disease, memory loss, aphasia, who presents to the hospital with confusion.  Clinical Impression  Pt very limited secondary mental status and confusion.  She appeared to be willing to try and get up but was completely unable to keep herself upright at EOB w/o considerable assist and any attempts at trying to stand would have been severely limited and unsafe.  Unsure how much pt is able to do at baseline, but overall she appears very limited and her mental status would likely be a limiter on how much she can gain with PT.  We will try a 3 day trial of PT to assess if she is appropriate to participate.    Follow Up Recommendations Supervision/Assistance - 24 hour (PT per ability to participate appropriately)    Equipment Recommendations       Recommendations for Other Services       Precautions / Restrictions Precautions Precautions: Fall Restrictions Weight Bearing Restrictions: No      Mobility  Bed Mobility Overal bed mobility: Needs Assistance Bed Mobility: Supine to Sit;Sit to Supine     Supine to sit: Max assist Sit to supine: Max assist   General bed mobility comments: Pt confused and unable to stay on task.  She could not keep herself upright in sitting at EOB despite a lot of assist to position.  As soon as PT let go of heavy assist she leaned back and would lay on the bed - numerous adjustments, cuing, assist to stay upright and each time she was unable to maintain sitting.  Transfers                 General transfer comment: unable, unsafe to attempt  Ambulation/Gait                Stairs            Wheelchair Mobility    Modified Rankin (Stroke Patients  Only)       Balance Overall balance assessment: Needs assistance Sitting-balance support: Bilateral upper extremity supported Sitting balance-Leahy Scale: Zero Sitting balance - Comments: Pt leaning/falling backward in sitting on every attempt at unassisted EOB sitting                                     Pertinent Vitals/Pain Pain Assessment: No/denies pain    Home Living Family/patient expects to be discharged to:: Assisted living (pt unable to answer)               Home Equipment: Wheelchair - manual      Prior Function Level of Independence: Needs assistance         Comments: It appears pt is very limited, unable to report     Hand Dominance        Extremity/Trunk Assessment   Upper Extremity Assessment Upper Extremity Assessment: Difficult to assess due to impaired cognition    Lower Extremity Assessment Lower Extremity Assessment: Difficult to assess due to impaired cognition       Communication   Communication: Expressive difficulties;Receptive difficulties  Cognition   Behavior During Therapy: Impulsive;Restless Overall Cognitive Status: Difficult to assess  General Comments      Exercises     Assessment/Plan    PT Assessment Patient needs continued PT services  PT Problem List Decreased strength;Decreased range of motion;Decreased activity tolerance;Decreased balance;Decreased mobility;Decreased cognition;Decreased coordination;Decreased knowledge of use of DME;Decreased safety awareness       PT Treatment Interventions DME instruction;Gait training;Stair training;Functional mobility training;Therapeutic exercise;Therapeutic activities;Balance training;Cognitive remediation;Patient/family education;Neuromuscular re-education;Wheelchair mobility training    PT Goals (Current goals can be found in the Care Plan section)  Acute Rehab PT Goals Patient Stated Goal:  unable to state PT Goal Formulation: Patient unable to participate in goal setting Time For Goal Achievement: 03/13/17 Potential to Achieve Goals: Poor    Frequency  (3 day trial)   Barriers to discharge        Co-evaluation               AM-PAC PT "6 Clicks" Daily Activity  Outcome Measure Difficulty turning over in bed (including adjusting bedclothes, sheets and blankets)?: Total Difficulty moving from lying on back to sitting on the side of the bed? : Total Difficulty sitting down on and standing up from a chair with arms (e.g., wheelchair, bedside commode, etc,.)?: Total Help needed moving to and from a bed to chair (including a wheelchair)?: Total Help needed walking in hospital room?: Total Help needed climbing 3-5 steps with a railing? : Total 6 Click Score: 6    End of Session Equipment Utilized During Treatment: Oxygen Activity Tolerance:  (treatment limited secondary to pt mental status) Patient left: with bed alarm set;with call bell/phone within reach Nurse Communication: Mobility status PT Visit Diagnosis: Muscle weakness (generalized) (M62.81);Difficulty in walking, not elsewhere classified (R26.2);History of falling (Z91.81)    Time: 6045-4098 PT Time Calculation (min) (ACUTE ONLY): 20 min   Charges:   PT Evaluation $PT Eval Low Complexity: 1 Procedure     PT G Codes:        Malachi Pro, DPT 02/27/2017, 10:06 AM

## 2017-02-28 ENCOUNTER — Inpatient Hospital Stay (HOSPITAL_COMMUNITY)
Admit: 2017-02-28 | Discharge: 2017-02-28 | Disposition: A | Discharge status: Home / Self Care | Payer: Medicare Other | Attending: Internal Medicine | Admitting: Internal Medicine

## 2017-02-28 DIAGNOSIS — I360 Nonrheumatic tricuspid (valve) stenosis: Secondary | ICD-10-CM

## 2017-02-28 DIAGNOSIS — G9341 Metabolic encephalopathy: Secondary | ICD-10-CM

## 2017-02-28 DIAGNOSIS — R7989 Other specified abnormal findings of blood chemistry: Secondary | ICD-10-CM

## 2017-02-28 LAB — GLUCOSE, CAPILLARY
GLUCOSE-CAPILLARY: 86 mg/dL (ref 65–99)
Glucose-Capillary: 56 mg/dL — ABNORMAL LOW (ref 65–99)

## 2017-02-28 LAB — BLOOD GAS, ARTERIAL
Acid-base deficit: 4.9 mmol/L — ABNORMAL HIGH (ref 0.0–2.0)
Bicarbonate: 21.6 mmol/L (ref 20.0–28.0)
FIO2: 0.32
O2 Saturation: 92.8 %
PATIENT TEMPERATURE: 37
PH ART: 7.28 — AB (ref 7.350–7.450)
pCO2 arterial: 46 mmHg (ref 32.0–48.0)
pO2, Arterial: 75 mmHg — ABNORMAL LOW (ref 83.0–108.0)

## 2017-02-28 LAB — BASIC METABOLIC PANEL
Anion gap: 8 (ref 5–15)
BUN: 32 mg/dL — AB (ref 6–20)
CALCIUM: 8.1 mg/dL — AB (ref 8.9–10.3)
CHLORIDE: 114 mmol/L — AB (ref 101–111)
CO2: 23 mmol/L (ref 22–32)
CREATININE: 2.99 mg/dL — AB (ref 0.44–1.00)
GFR calc Af Amer: 16 mL/min — ABNORMAL LOW (ref >60)
GFR calc non Af Amer: 14 mL/min — ABNORMAL LOW (ref >60)
Glucose, Bld: 66 mg/dL (ref 65–99)
Potassium: 4.6 mmol/L (ref 3.5–5.1)
Sodium: 145 mmol/L (ref 135–145)

## 2017-02-28 LAB — CBC
HCT: 23.7 % — ABNORMAL LOW (ref 35.0–47.0)
Hemoglobin: 7.9 g/dL — ABNORMAL LOW (ref 12.0–16.0)
MCH: 30 pg (ref 26.0–34.0)
MCHC: 33.3 g/dL (ref 32.0–36.0)
MCV: 90.1 fL (ref 80.0–100.0)
PLATELETS: 95 10*3/uL — AB (ref 150–440)
RBC: 2.63 MIL/uL — ABNORMAL LOW (ref 3.80–5.20)
RDW: 17.1 % — AB (ref 11.5–14.5)
WBC: 3.8 10*3/uL (ref 3.6–11.0)

## 2017-02-28 LAB — ECHOCARDIOGRAM COMPLETE
HEIGHTINCHES: 63 in
WEIGHTICAEL: 2209.6 [oz_av]

## 2017-02-28 LAB — CK: Total CK: 150 U/L (ref 38–234)

## 2017-02-28 NOTE — Progress Notes (Signed)
[facility] [place] at [facility]   PATIENT NAME: Stacey Baker    MR#:  [id number]  DATE OF BIRTH:  [birth year]  SUBJECTIVE:  CHIEF COMPLAINT:   Chief Complaint  Patient presents with  . Urinary Frequency   Patient is 83 year old Caucasian female with past medical history significant for history of CAD stage III, pericardial, pleural effusions, chronic diastolic CHF, seizures, Parkinson's disease, memory loss, aphasia, who presents to the hospital with confusion. She was noted to have worsening renal failure with creatinine level of 3.2, up from 1.8 baseline. She was also noted to be anemic, she was noted to have slightly positive Hemoccult of stool. She was hydrated on admission, her creatinine level has improved, however hemoglobin level was found to be low at 7.9, no active bleeding was noted. Patient Has difficulty expressing herself today, more somnolent, unable to get review of systems. ABGs revealed mild acidosis, which is  metabolic. Patient was seen by neurologist, ammonia, TSH,vitamin B12 levels were recommended to be checked, no further neurologic interventions..    Review of Systems  Unable to perform ROS: Dementia    VITAL SIGNS: Blood pressure (!) 117/43, pulse 73, temperature 98.8 F (37.1 C), temperature source Oral, resp. rate 17, height 5\' 3"  (1.6 m), weight 62.6 kg (138 lb 1.6 oz), SpO2 96 %.  PHYSICAL EXAMINATION:   GENERAL:  83 y.o.-year-old patient lying in the bed with no acute distress, somnolent, not opening her eyes and does not converse, expressive aphasia. Dry oral mucosa EYES: Pupils equal, round, reactive to light and accommodation. No scleral icterus. Extraocular muscles intact.  HEENT: Head atraumatic, normocephalic. Oropharynx and nasopharynx clear. Bruising of her left periorbital area NECK:  Supple, no jugular venous distention. No thyroid enlargement, no tenderness.  LUNGS: Better air entrance bilaterally in lungs,  especially at bases, some diminished on the right base more than the left, b, no wheezing, rales,rhonchi or crepitation. No use of accessory muscles of respiration.  CARDIOVASCULAR: S1, S2 normal. No murmurs, rubs, or gallops.  ABDOMEN: Soft, nontender, nondistended. Bowel sounds present. No organomegaly or mass.  EXTREMITIES: No pedal edema, cyanosis, or clubbing.  NEUROLOGIC: Cranial nerves II through XII are intact. Muscle strength 5/5 in all extremities. Sensation intact. Gait not checked.  PSYCHIATRIC: The patient is alert, difficult to assess her orientationash, lesion, or ulcer.   ORDERS/RESULTS REVIEWED:   CBC  Recent Labs Lab 02/26/17 1807 02/27/17 0529 02/28/17 0349  WBC 6.0 4.9 3.8  HGB 8.1* 7.6* 7.9*  HCT 24.6* 22.9* 23.7*  PLT 111* 99* 95*  MCV 88.0 87.6 90.1  MCH 28.8 29.0 30.0  MCHC 32.8 33.1 33.3  RDW 16.5* 16.9* 17.1*  LYMPHSABS 0.7*  --   --   MONOABS 0.5  --   --   EOSABS 0.0  --   --   BASOSABS 0.1  --   --    ------------------------------------------------------------------------------------------------------------------  Chemistries   Recent Labs Lab 02/26/17 1807 02/27/17 0529 02/28/17 0349  NA 137 141 145  K 5.4* 4.8 4.6  CL 106 113* 114*  CO2 22 23 23   GLUCOSE 89 86 66  BUN 34* 33* 32*  CREATININE 3.17* 2.98* 2.99*  CALCIUM 8.4* 8.0* 8.1*   ------------------------------------------------------------------------------------------------------------------ estimated creatinine clearance is 12 mL/min (A) (by C-G formula based on SCr of 2.99 mg/dL (H)). ------------------------------------------------------------------------------------------------------------------ No results for input(s): TSH, T4TOTAL, T3FREE, THYROIDAB in the last 72 hours.  Invalid input(s): FREET3  Cardiac Enzymes  Recent Labs Lab 02/26/17 2359 02/27/17  0529 02/27/17 1107  TROPONINI <0.03 0.03* 0.03*    ------------------------------------------------------------------------------------------------------------------ Invalid input(s): POCBNP ---------------------------------------------------------------------------------------------------------------  RADIOLOGY: US Renal  Result Date: 02/27/2017 CLINICAL DATA:  Acute on chronic renal failure EXAM: RENAL / URINARY TRACT ULTRASOUND COMPLETE COMPARISON:  None. FINDINGS: Right Kidney: Length: 8.9 cm. Increased echogenicity is noted. A 3.0 cm cyst is noted in the upper pole of the right kidney. A smaller adjacent cyst is noted as well as small cysts in the lower pole. Left Kidney: Length: 8.3 cm.  2.1 cm lower pole cyst in the left kidney. Bladder: Decompressed IMPRESSION: Bilateral renal cysts. Electronically Signed   By: Alcide Clever M.D.   On: 02/27/2017 12:43    EKG:  Orders placed or performed during the hospital encounter of 02/26/17  . ED EKG  . ED EKG    ASSESSMENT AND PLAN:  Active Problems:   Generalized weakness   Acute on chronic renal failure (HCC)   Anemia   Guaiac positive stools   Fall  #1. Acute on chronic renal failure, Bladder residual was only 10 cc, initially some improved with IV fluid administration, remained stable, appreciate nephrologist input, renal ultrasound revealed bilateral renal cysts, discussed with Dr. Thedore Mins today during rounds. Urinalysis was unremarkable, unlikely UTI . The patient is acidotic, metabolic acidosis, possibly related to renal failure. Get CK levels checked to rule out rhabdomyolysis related to renal failure  #2. Elevated troponin, likely demand ischemia, pending echocardiogram #3. Hyperkalemia, resolved with Kayexalate and improvement of kidney function , following closely #4. Generalized weakness, fall,  physical therapist saw patient in consultation and recommended 24-hour supervision/assistance, speech therapist evaluated patient, recommended dysphagia 3 diet with thin liquids,  aspiration precautions , patient is progressively weaker, getting palliative care involved as well to discuss with her and family #5. Hemoccult positive stool, anemia, iron studies, ferritin revealed anemia of chronic disease, suspect combination of chronic blood loss anemia and anemia of chronic disease due to renal failure, gastroenterologist consultation was requested, continue Protonix , no active bleeding, hemoglobin remains stable #7. Confusion, etiology is unclear, appreciate neurologist input, getting TSH, ammonia, B12 levels, no further diagnostics were recommended by Dr. Thad Ranger #8. Acidosis, metabolic, likely related to renal failure. follow labs closely, initiate bicarbonate intravenously if CK levels are elevated  Management plans discussed with the patient, family and they are in agreement.   DRUG ALLERGIES:  Allergies  Allergen Reactions  . Penicillins Other (See Comments)    Reaction:  Unknown   . Plavix [Clopidogrel Bisulfate] Other (See Comments)    Reaction:  Unknown     CODE STATUS:     Code Status Orders        Start     Ordered   02/26/17 2326  Do not attempt resuscitation (DNR)  Continuous    Question Answer Comment  In the event of cardiac or respiratory ARREST Do not call a "code blue"   In the event of cardiac or respiratory ARREST Do not perform Intubation, CPR, defibrillation or ACLS   In the event of cardiac or respiratory ARREST Use medication by any route, position, wound care, and other measures to relive pain and suffering. May use oxygen, suction and manual treatment of airway obstruction as needed for comfort.      02/26/17 2325    Code Status History    Date Active Date Inactive Code Status Order ID Comments User Context   07/05/2016  5:47 PM 07/09/2016  7:51 PM Full Code [id number]  Marguarite Arbour, MD ED  07/03/2016 11:04 AM 07/03/2016  5:26 PM Full Code [id number]  Milagros LollSudini, Srikar, MD ED   12/31/2015  5:42 PM 01/06/2016  7:12 PM Full Code  [id number]  Houston SirenSainani, Vivek J, MD Inpatient   09/13/2015 10:55 PM 09/15/2015  8:25 PM Full Code [id number]  Oralia ManisWillis, David, MD Inpatient   08/28/2015  8:14 AM 08/29/2015  4:49 PM DNR [id number]  Delfino LovettShah, Vipul, MD Inpatient   08/27/2015 11:25 PM 08/28/2015  8:14 AM Full Code [id number]  Hower, Cletis Athensavid K, MD ED    Advance Directive Documentation     Most Recent Value  Type of Advance Directive  Out of facility DNR (pink MOST or yellow form)  Pre-existing out of facility DNR order (yellow form or pink MOST form)  -  "MOST" Form in Place?  -      TOTAL TIME TAKING CARE OF THIS PATIENT 35 minutes.    Katharina CaperVAICKUTE,Avonelle Viveros M.D on 02/28/2017 at 2:31 PM  Between 7am to 6pm - Pager - [phone]  After 6pm go to [email or web address] - password [email or web address] [facility]  [facility]  Office  [phone]  CC: Primary care physician; Merlene PullingMcGranaghan, Mary Beth, PA-C

## 2017-02-28 NOTE — Progress Notes (Signed)
Pt had SVT with HR up to 150, on assessment Pt sleeping with no signs of distress. MD notified and advised to continue to monitor.

## 2017-02-28 NOTE — Consult Note (Signed)
Reason for Consult:Confusion Referring Physician: Winona Legato  CC: Confusion  HPI: Stacey Baker is an 83 y.o. female with a history of stroke, seizures and dementia that has had quite a few ED visits over the past couple of months due to poor balance, generalized weakness and falls.  Was sent to the ED on yesterday for possible UTI.  Was reportedly confused as well.  UA was unremarkable but patient with other metabolic issues and was admitted.    Past Medical History:  Diagnosis Date  . Apraxia following cerebral infarction   . CKD (chronic kidney disease), stage III    a. With acute worsening/AKI noted on several admissions.  . Dementia   . Dementia without behavioral disturbance   . Depression   . Encephalopathy   . Hyperkalemia    a. 08/2015  . Hyperlipidemia   . Hyperlipidemia   . Hypertension   . Iron deficiency anemia   . Major depressive disorder   . Muscle weakness   . Parkinson disease (HCC)   . Parkinson's disease (HCC)   . Pericardial effusion    a. 06/2016 Echo: Ef 60-65%, no rwma, Gr1 DD, mild MR, mildly dil LA, PASP , mod circumferential pericardial effusion - no hemodynamic compromise.  . Sinus bradycardia    a. 08/2015 in setting of beta blocker and hyperkalemia.  . Stroke (HCC)   . TIA (transient ischemic attack)    a. 08/2015 - essentially neg w/u for stroke.  Marland Kitchen UTI (urinary tract infection)     Past Surgical History:  Procedure Laterality Date  . ABDOMINAL HYSTERECTOMY    . APPENDECTOMY    . CHOLECYSTECTOMY      Family History  Problem Relation Age of Onset  . Heart disease Mother   . Hypertension Other     Social History:  reports that she has never smoked. She has never used smokeless tobacco. She reports that she does not drink alcohol or use drugs.  Allergies  Allergen Reactions  . Penicillins Other (See Comments)    Reaction:  Unknown   . Plavix [Clopidogrel Bisulfate] Other (See Comments)    Reaction:  Unknown     Medications:  I  have reviewed the patient's current medications. Prior to Admission:  Prescriptions Prior to Admission  Medication Sig Dispense Refill Last Dose  . acetaminophen (TYLENOL) 325 MG tablet Take 650 mg by mouth every 6 (six) hours as needed.   prn at prn  . acidophilus (RISAQUAD) CAPS capsule Take 1 capsule by mouth 2 (two) times daily.   02/26/2017 at 0900  . amLODipine (NORVASC) 10 MG tablet Take 1 tablet (10 mg total) by mouth daily. 30 tablet 5 02/25/2017 at 0900  . aspirin EC 325 MG tablet Take 1 tablet (325 mg total) by mouth daily. 30 tablet 3 02/26/2017 at 0800  . atorvastatin (LIPITOR) 10 MG tablet Take 10 mg by mouth at bedtime.   02/25/2017 at 2000  . carbidopa-levodopa (SINEMET IR) 25-100 MG tablet Take 1 tablet by mouth 3 (three) times daily.   02/26/2017 at 1400  . cyclobenzaprine (FLEXERIL) 10 MG tablet Take 10 mg by mouth 3 (three) times daily as needed for muscle spasms.   prn at prn  . diclofenac sodium (VOLTAREN) 1 % GEL Apply 2 g topically 2 (two) times daily. Pt applies to left knee.   02/26/2017 at 0900  . donepezil (ARICEPT) 10 MG tablet Take 10 mg by mouth daily.   02/25/2017 at 2000  . escitalopram (LEXAPRO) 10 MG tablet  Take 10 mg by mouth daily.   02/26/2017 at 0800  . ferrous sulfate 325 (65 FE) MG tablet Take 325 mg by mouth 2 (two) times daily.   02/26/2017 at 0900  . levETIRAcetam (KEPPRA) 250 MG tablet Take 250 mg by mouth daily.    02/26/2017 at 0800  . miconazole (BAZA ANTIFUNGAL) 2 % cream Apply 1 application topically 2 (two) times daily as needed. To sacrum every shift for redness after each toilet   prn at prn  . mirtazapine (REMERON) 15 MG tablet Take 15 mg by mouth at bedtime.   02/25/2017 at 2000  . Multiple Vitamins-Minerals (PRESERVISION AREDS 2 PO) Take 2 capsules by mouth daily.   02/26/2017 at 0800  . QUEtiapine (SEROQUEL) 25 MG tablet Take 25 mg by mouth at bedtime.   02/25/2017 at 2000  . Soft Lens Products (EQ SALINE SOLUTION/SENSITIVE) SOLN Apply 1 application  topically every other day. Apply to neck topically once every other day for wound until healed.   02/26/2017 at 0900  . sulfamethoxazole-trimethoprim (BACTRIM DS,SEPTRA DS) 800-160 MG tablet Take 1 tablet by mouth 2 (two) times daily.   02/26/2017 at 0800  . vitamin B-12 (CYANOCOBALAMIN) 1000 MCG tablet Take 1,000 mcg by mouth daily.   02/26/2017 at 0900   Scheduled: . acidophilus  1 capsule Oral BID  . amLODipine  10 mg Oral Daily  . atorvastatin  10 mg Oral QHS  . carbidopa-levodopa  1 tablet Oral TID  . diclofenac sodium  2 g Topical BID  . donepezil  10 mg Oral Daily  . escitalopram  10 mg Oral Daily  . ferrous sulfate  325 mg Oral BID  . levETIRAcetam  250 mg Oral Daily  . mirtazapine  15 mg Oral QHS  . multivitamin-lutein   Oral Daily  . mupirocin cream   Topical Daily  . pantoprazole  40 mg Oral Daily  . QUEtiapine  25 mg Oral QHS  . vitamin B-12  1,000 mcg Oral Daily    ROS: History obtained from chart review  General ROS: negative for - chills, fatigue, fever, night sweats, weight gain or weight loss Psychological ROS: negative for - behavioral disorder, hallucinations, memory difficulties, mood swings or suicidal ideation Ophthalmic ROS: negative for - blurry vision, double vision, eye pain or loss of vision ENT ROS: negative for - epistaxis, nasal discharge, oral lesions, sore throat, tinnitus or vertigo Allergy and Immunology ROS: negative for - hives or itchy/watery eyes Hematological and Lymphatic ROS: negative for - bleeding problems, bruising or swollen lymph nodes Endocrine ROS: negative for - galactorrhea, hair pattern changes, polydipsia/polyuria or temperature intolerance Respiratory ROS: cough Cardiovascular ROS: negative for - chest pain, dyspnea on exertion, edema or irregular heartbeat Gastrointestinal ROS: negative for - abdominal pain, diarrhea, hematemesis, nausea/vomiting or stool incontinence Genito-Urinary ROS: negative for - dysuria, hematuria,  incontinence or urinary frequency/urgency Musculoskeletal ROS: left leg pain Neurological ROS: as noted in HPI, leaning to the right Dermatological ROS: negative for rash and skin lesion changes  Physical Examination: Blood pressure 130/63, pulse 70, temperature 97.7 F (36.5 C), resp. rate 18, height 5\' 3"  (1.6 m), weight 62.6 kg (138 lb 1.6 oz), SpO2 95 %.  Gen: NAD HEENT-  Normocephalic, no lesions, without obvious abnormality.  Normal external eye and conjunctiva.  Normal TM's bilaterally.  Normal auditory canals and external ears. Normal external nose, mucus membranes and septum.  Normal pharynx. Cardiovascular- S1, S2 normal, pulses palpable throughout   Lungs- wheezes bilaterally Abdomen- soft, non-tender;  bowel sounds normal; no masses,  no organomegaly Extremities- no edema Lymph-no adenopathy palpable Musculoskeletal-joint deformities from arthritis Skin-warm and dry, no hyperpigmentation, vitiligo, or suspicious lesions  Neurological Examination   Mental Status: Alert, oriented to name but does not know where she is.  Reports that it is 2017 and April.  Reports that she is 83 years old.  Mild aphasia noted.  Able to follow 3 step commands. Cranial Nerves: II: Discs flat bilaterally; Visual fields grossly normal, pupils equal, round, reactive to light and accommodation III,IV, VI: ptosis not present, extra-ocular motions intact bilaterally V,VII: right facial asymmetry, facial light touch sensation normal bilaterally VIII: hearing normal bilaterally IX,X: gag reflex present XI: bilateral shoulder shrug XII: midline tongue extension Motor: Right : Upper extremity   5/5    Left:     Upper extremity   4/5  Lower extremity   5/5     Lower extremity   5/5 Tone and bulk:normal tone throughout; no atrophy noted.  Twitching of all extremities noted.   Sensory: Pinprick and light touch intact throughout, bilaterally Deep Tendon Reflexes: 2+ in the upper extremities and absent in  the lower extremities Plantars: Right: upgoing   Left: downgoing Cerebellar: Normal finger-to-nose and normal heel-to-shin testing bilaterally Gait: not tested due to safety concerns    Laboratory Studies:   Basic Metabolic Panel:  Recent Labs Lab 02/26/17 1807 02/27/17 0529 02/28/17 0349  NA 137 141 145  K 5.4* 4.8 4.6  CL 106 113* 114*  CO2 22 23 23   GLUCOSE 89 86 66  BUN 34* 33* 32*  CREATININE 3.17* 2.98* 2.99*  CALCIUM 8.4* 8.0* 8.1*    Liver Function Tests: No results for input(s): AST, ALT, ALKPHOS, BILITOT, PROT, ALBUMIN in the last 168 hours. No results for input(s): LIPASE, AMYLASE in the last 168 hours. No results for input(s): AMMONIA in the last 168 hours.  CBC:  Recent Labs Lab 02/26/17 1807 02/27/17 0529 02/28/17 0349  WBC 6.0 4.9 3.8  NEUTROABS 4.6  --   --   HGB 8.1* 7.6* 7.9*  HCT 24.6* 22.9* 23.7*  MCV 88.0 87.6 90.1  PLT 111* 99* 95*    Cardiac Enzymes:  Recent Labs Lab 02/26/17 1807 02/26/17 2359 02/27/17 0529 02/27/17 1107  TROPONINI <0.03 <0.03 0.03* 0.03*    BNP: Invalid input(s): POCBNP  CBG:  Recent Labs Lab 02/27/17 0744 02/28/17 0741 02/28/17 0821  GLUCAP 72 56* 86    Microbiology: Results for orders placed or performed during the hospital encounter of 07/05/16  MRSA PCR Screening     Status: None   Collection Time: 07/05/16  7:58 PM  Result Value Ref Range Status   MRSA by PCR NEGATIVE NEGATIVE Final    Comment:        The GeneXpert MRSA Assay (FDA approved for NASAL specimens only), is one component of a comprehensive MRSA colonization surveillance program. It is not intended to diagnose MRSA infection nor to guide or monitor treatment for MRSA infections.     Coagulation Studies: No results for input(s): LABPROT, INR in the last 72 hours.  Urinalysis:  Recent Labs Lab 02/26/17 1713  COLORURINE YELLOW*  LABSPEC 1.014  PHURINE 5.0  GLUCOSEU NEGATIVE  HGBUR NEGATIVE  BILIRUBINUR NEGATIVE   KETONESUR NEGATIVE  PROTEINUR NEGATIVE  NITRITE NEGATIVE  LEUKOCYTESUR NEGATIVE    Lipid Panel:     Component Value Date/Time   CHOL 130 08/28/2015 1124   CHOL 157 05/04/2014 0453   TRIG 37 08/28/2015 1124  TRIG 52 05/04/2014 0453   HDL 61 08/28/2015 1124   HDL 79 (H) 05/04/2014 0453   CHOLHDL 2.1 08/28/2015 1124   VLDL 7 08/28/2015 1124   VLDL 10 05/04/2014 0453   LDLCALC 62 08/28/2015 1124   LDLCALC 68 05/04/2014 0453    HgbA1C:  Lab Results  Component Value Date   HGBA1C 5.1 08/28/2015    Urine Drug Screen:     Component Value Date/Time   LABOPIA NONE DETECTED 07/03/2016 1604   COCAINSCRNUR NONE DETECTED 07/03/2016 1604   LABBENZ NONE DETECTED 07/03/2016 1604   AMPHETMU NONE DETECTED 07/03/2016 1604   THCU NONE DETECTED 07/03/2016 1604   LABBARB NONE DETECTED 07/03/2016 1604    Alcohol Level: No results for input(s): ETH in the last 168 hours.  Other results: EKG: sinus rhythm at 58 bpm.  Imaging: US Renal  Result Date: 02/27/2017 CLINICAL DATA:  Acute on chronic renal failure EXAM: RENAL / URINARY TRACT ULTRASOUND COMPLETE COMPARISON:  None. FINDINGS: Right Kidney: Length: 8.9 cm. Increased echogenicity is noted. A 3.0 cm cyst is noted in the upper pole of the right kidney. A smaller adjacent cyst is noted as well as small cysts in the lower pole. Left Kidney: Length: 8.3 cm.  2.1 cm lower pole cyst in the left kidney. Bladder: Decompressed IMPRESSION: Bilateral renal cysts. Electronically Signed   By: Alcide Clever M.D.   On: 02/27/2017 12:43     Assessment/Plan: 83 year old female with a history of stroke and dementia who presented with her facility reporting confusion.  It is unclear what the patient's baseline is but she does have some orientation issues.  Is otherwise able to follow commands.  Has some aphasia at baseline based on old neurological evaluations.  Also has chronic LLE weakness per those evaluations as well.  Head CT reviewed and shows no  acute changes.  Patient acidotic though and has an elevated BUN/CR above baseline.  These metabolic issues are likely contributing to her altered state.  Her hospitalization may cause her to have some further confusion as well, sundowning, etc.    Recommendations: 1.  Agree with addressing renal and pulmonary issues.   2.  TSH, B12, ammonia 3.  No further neurologic intervention is recommended at this time.  If further questions arise, please call or page at that time.  Thank you for allowing neurology to participate in the care of this patient.    Thana Farr, MD Neurology [phone] 02/28/2017, 1:20 PM

## 2017-02-28 NOTE — Consult Note (Signed)
Reason for Consult:Confusion Referring Physician: Winona Legato  CC: Confusion  HPI: Stacey Baker is an 83 y.o. female with a history of stroke, seizures and dementia that has had quite a few ED visits over the past couple of months due to poor balance, generalized weakness and falls.  Was sent to the ED on yesterday for possible UTI.  Was reportedly confused as well.  UA was unremarkable but patient with other metabolic issues and was admitted.    Past Medical History:  Diagnosis Date  . Apraxia following cerebral infarction   . CKD (chronic kidney disease), stage III    a. With acute worsening/AKI noted on several admissions.  . Dementia   . Dementia without behavioral disturbance   . Depression   . Encephalopathy   . Hyperkalemia    a. 08/2015  . Hyperlipidemia   . Hyperlipidemia   . Hypertension   . Iron deficiency anemia   . Major depressive disorder   . Muscle weakness   . Parkinson disease (HCC)   . Parkinson's disease (HCC)   . Pericardial effusion    a. 06/2016 Echo: Ef 60-65%, no rwma, Gr1 DD, mild MR, mildly dil LA, PASP , mod circumferential pericardial effusion - no hemodynamic compromise.  . Sinus bradycardia    a. 08/2015 in setting of beta blocker and hyperkalemia.  . Stroke (HCC)   . TIA (transient ischemic attack)    a. 08/2015 - essentially neg w/u for stroke.  Marland Kitchen UTI (urinary tract infection)     Past Surgical History:  Procedure Laterality Date  . ABDOMINAL HYSTERECTOMY    . APPENDECTOMY    . CHOLECYSTECTOMY      Family History  Problem Relation Age of Onset  . Heart disease Mother   . Hypertension Other     Social History:  reports that she has never smoked. She has never used smokeless tobacco. She reports that she does not drink alcohol or use drugs.  Allergies  Allergen Reactions  . Penicillins Other (See Comments)    Reaction:  Unknown   . Plavix [Clopidogrel Bisulfate] Other (See Comments)    Reaction:  Unknown     Medications:  I  have reviewed the patient's current medications. Prior to Admission:  Prescriptions Prior to Admission  Medication Sig Dispense Refill Last Dose  . acetaminophen (TYLENOL) 325 MG tablet Take 650 mg by mouth every 6 (six) hours as needed.   prn at prn  . acidophilus (RISAQUAD) CAPS capsule Take 1 capsule by mouth 2 (two) times daily.   02/26/2017 at 0900  . amLODipine (NORVASC) 10 MG tablet Take 1 tablet (10 mg total) by mouth daily. 30 tablet 5 02/25/2017 at 0900  . aspirin EC 325 MG tablet Take 1 tablet (325 mg total) by mouth daily. 30 tablet 3 02/26/2017 at 0800  . atorvastatin (LIPITOR) 10 MG tablet Take 10 mg by mouth at bedtime.   02/25/2017 at 2000  . carbidopa-levodopa (SINEMET IR) 25-100 MG tablet Take 1 tablet by mouth 3 (three) times daily.   02/26/2017 at 1400  . cyclobenzaprine (FLEXERIL) 10 MG tablet Take 10 mg by mouth 3 (three) times daily as needed for muscle spasms.   prn at prn  . diclofenac sodium (VOLTAREN) 1 % GEL Apply 2 g topically 2 (two) times daily. Pt applies to left knee.   02/26/2017 at 0900  . donepezil (ARICEPT) 10 MG tablet Take 10 mg by mouth daily.   02/25/2017 at 2000  . escitalopram (LEXAPRO) 10 MG tablet  Take 10 mg by mouth daily.   02/26/2017 at 0800  . ferrous sulfate 325 (65 FE) MG tablet Take 325 mg by mouth 2 (two) times daily.   02/26/2017 at 0900  . levETIRAcetam (KEPPRA) 250 MG tablet Take 250 mg by mouth daily.    02/26/2017 at 0800  . miconazole (BAZA ANTIFUNGAL) 2 % cream Apply 1 application topically 2 (two) times daily as needed. To sacrum every shift for redness after each toilet   prn at prn  . mirtazapine (REMERON) 15 MG tablet Take 15 mg by mouth at bedtime.   02/25/2017 at 2000  . Multiple Vitamins-Minerals (PRESERVISION AREDS 2 PO) Take 2 capsules by mouth daily.   02/26/2017 at 0800  . QUEtiapine (SEROQUEL) 25 MG tablet Take 25 mg by mouth at bedtime.   02/25/2017 at 2000  . Soft Lens Products (EQ SALINE SOLUTION/SENSITIVE) SOLN Apply 1 application  topically every other day. Apply to neck topically once every other day for wound until healed.   02/26/2017 at 0900  . sulfamethoxazole-trimethoprim (BACTRIM DS,SEPTRA DS) 800-160 MG tablet Take 1 tablet by mouth 2 (two) times daily.   02/26/2017 at 0800  . vitamin B-12 (CYANOCOBALAMIN) 1000 MCG tablet Take 1,000 mcg by mouth daily.   02/26/2017 at 0900   Scheduled: . acidophilus  1 capsule Oral BID  . amLODipine  10 mg Oral Daily  . atorvastatin  10 mg Oral QHS  . carbidopa-levodopa  1 tablet Oral TID  . diclofenac sodium  2 g Topical BID  . donepezil  10 mg Oral Daily  . escitalopram  10 mg Oral Daily  . ferrous sulfate  325 mg Oral BID  . levETIRAcetam  250 mg Oral Daily  . mirtazapine  15 mg Oral QHS  . multivitamin-lutein   Oral Daily  . mupirocin cream   Topical Daily  . pantoprazole  40 mg Oral Daily  . QUEtiapine  25 mg Oral QHS  . vitamin B-12  1,000 mcg Oral Daily    ROS: History obtained from chart review  General ROS: negative for - chills, fatigue, fever, night sweats, weight gain or weight loss Psychological ROS: negative for - behavioral disorder, hallucinations, memory difficulties, mood swings or suicidal ideation Ophthalmic ROS: negative for - blurry vision, double vision, eye pain or loss of vision ENT ROS: negative for - epistaxis, nasal discharge, oral lesions, sore throat, tinnitus or vertigo Allergy and Immunology ROS: negative for - hives or itchy/watery eyes Hematological and Lymphatic ROS: negative for - bleeding problems, bruising or swollen lymph nodes Endocrine ROS: negative for - galactorrhea, hair pattern changes, polydipsia/polyuria or temperature intolerance Respiratory ROS: cough Cardiovascular ROS: negative for - chest pain, dyspnea on exertion, edema or irregular heartbeat Gastrointestinal ROS: negative for - abdominal pain, diarrhea, hematemesis, nausea/vomiting or stool incontinence Genito-Urinary ROS: negative for - dysuria, hematuria,  incontinence or urinary frequency/urgency Musculoskeletal ROS: left leg pain Neurological ROS: as noted in HPI, leaning to the right Dermatological ROS: negative for rash and skin lesion changes  Physical Examination: Blood pressure 130/63, pulse 70, temperature 97.7 F (36.5 C), resp. rate 18, height 5\' 3"  (1.6 m), weight 62.6 kg (138 lb 1.6 oz), SpO2 95 %.  Gen: NAD HEENT-  Normocephalic, no lesions, without obvious abnormality.  Normal external eye and conjunctiva.  Normal TM's bilaterally.  Normal auditory canals and external ears. Normal external nose, mucus membranes and septum.  Normal pharynx. Cardiovascular- S1, S2 normal, pulses palpable throughout   Lungs- wheezes bilaterally Abdomen- soft, non-tender;  bowel sounds normal; no masses,  no organomegaly Extremities- no edema Lymph-no adenopathy palpable Musculoskeletal-joint deformities from arthritis Skin-warm and dry, no hyperpigmentation, vitiligo, or suspicious lesions  Neurological Examination   Mental Status: Alert, oriented to name but does not know where she is.  Reports that it is 2017 and April.  Reports that she is 83 years old.  Mild aphasia noted.  Able to follow 3 step commands. Cranial Nerves: II: Discs flat bilaterally; Visual fields grossly normal, pupils equal, round, reactive to light and accommodation III,IV, VI: ptosis not present, extra-ocular motions intact bilaterally V,VII: right facial asymmetry, facial light touch sensation normal bilaterally VIII: hearing normal bilaterally IX,X: gag reflex present XI: bilateral shoulder shrug XII: midline tongue extension Motor: Right : Upper extremity   5/5    Left:     Upper extremity   4/5  Lower extremity   5/5     Lower extremity   5/5 Tone and bulk:normal tone throughout; no atrophy noted.  Twitching of all extremities noted.   Sensory: Pinprick and light touch intact throughout, bilaterally Deep Tendon Reflexes: 2+ in the upper extremities and absent in  the lower extremities Plantars: Right: upgoing   Left: downgoing Cerebellar: Normal finger-to-nose and normal heel-to-shin testing bilaterally Gait: not tested due to safety concerns    Laboratory Studies:   Basic Metabolic Panel:  Recent Labs Lab 02/26/17 1807 02/27/17 0529 02/28/17 0349  NA 137 141 145  K 5.4* 4.8 4.6  CL 106 113* 114*  CO2 22 23 23   GLUCOSE 89 86 66  BUN 34* 33* 32*  CREATININE 3.17* 2.98* 2.99*  CALCIUM 8.4* 8.0* 8.1*    Liver Function Tests: No results for input(s): AST, ALT, ALKPHOS, BILITOT, PROT, ALBUMIN in the last 168 hours. No results for input(s): LIPASE, AMYLASE in the last 168 hours. No results for input(s): AMMONIA in the last 168 hours.  CBC:  Recent Labs Lab 02/26/17 1807 02/27/17 0529 02/28/17 0349  WBC 6.0 4.9 3.8  NEUTROABS 4.6  --   --   HGB 8.1* 7.6* 7.9*  HCT 24.6* 22.9* 23.7*  MCV 88.0 87.6 90.1  PLT 111* 99* 95*    Cardiac Enzymes:  Recent Labs Lab 02/26/17 1807 02/26/17 2359 02/27/17 0529 02/27/17 1107  TROPONINI <0.03 <0.03 0.03* 0.03*    BNP: Invalid input(s): POCBNP  CBG:  Recent Labs Lab 02/27/17 0744 02/28/17 0741 02/28/17 0821  GLUCAP 72 56* 86    Microbiology: Results for orders placed or performed during the hospital encounter of 07/05/16  MRSA PCR Screening     Status: None   Collection Time: 07/05/16  7:58 PM  Result Value Ref Range Status   MRSA by PCR NEGATIVE NEGATIVE Final    Comment:        The GeneXpert MRSA Assay (FDA approved for NASAL specimens only), is one component of a comprehensive MRSA colonization surveillance program. It is not intended to diagnose MRSA infection nor to guide or monitor treatment for MRSA infections.     Coagulation Studies: No results for input(s): LABPROT, INR in the last 72 hours.  Urinalysis:  Recent Labs Lab 02/26/17 1713  COLORURINE YELLOW*  LABSPEC 1.014  PHURINE 5.0  GLUCOSEU NEGATIVE  HGBUR NEGATIVE  BILIRUBINUR NEGATIVE   KETONESUR NEGATIVE  PROTEINUR NEGATIVE  NITRITE NEGATIVE  LEUKOCYTESUR NEGATIVE    Lipid Panel:     Component Value Date/Time   CHOL 130 08/28/2015 1124   CHOL 157 05/04/2014 0453   TRIG 37 08/28/2015 1124  TRIG 52 05/04/2014 0453   HDL 61 08/28/2015 1124   HDL 79 (H) 05/04/2014 0453   CHOLHDL 2.1 08/28/2015 1124   VLDL 7 08/28/2015 1124   VLDL 10 05/04/2014 0453   LDLCALC 62 08/28/2015 1124   LDLCALC 68 05/04/2014 0453    HgbA1C:  Lab Results  Component Value Date   HGBA1C 5.1 08/28/2015    Urine Drug Screen:     Component Value Date/Time   LABOPIA NONE DETECTED 07/03/2016 1604   COCAINSCRNUR NONE DETECTED 07/03/2016 1604   LABBENZ NONE DETECTED 07/03/2016 1604   AMPHETMU NONE DETECTED 07/03/2016 1604   THCU NONE DETECTED 07/03/2016 1604   LABBARB NONE DETECTED 07/03/2016 1604    Alcohol Level: No results for input(s): ETH in the last 168 hours.  Other results: EKG: sinus rhythm at 58 bpm.  Imaging: [place] [facility]  Result Date: 02/27/2017 CLINICAL DATA:  Acute on chronic renal failure EXAM: RENAL / URINARY TRACT ULTRASOUND COMPLETE COMPARISON:  None. FINDINGS: Right Kidney: Length: 8.9 cm. Increased echogenicity is noted. A 3.0 cm cyst is noted in the upper pole of the right kidney. A smaller adjacent cyst is noted as well as small cysts in the lower pole. Left Kidney: Length: 8.3 cm.  2.1 cm lower pole cyst in the left kidney. Bladder: Decompressed IMPRESSION: Bilateral renal cysts. Electronically Signed   By: Alcide CleverMark  Lukens M.D.   On: 02/27/2017 12:43     Assessment/Plan: 83 year old female with a history of stroke and dementia who presented with her facility reporting confusion.  It is unclear what the patient's baseline is but she does have some orientation issues.  Is otherwise able to follow commands.  Has some aphasia at baseline based on old neurological evaluations.  Also has chronic LLE weakness per those evaluations as well.  Head CT reviewed and shows no  acute changes.  Patient acidotic though and has an elevated BUN/CR above baseline.  These metabolic issues are likely contributing to her altered state.  Her hospitalization may cause her to have some further confusion as well, sundowning, etc.    Recommendations: 1.  Agree with addressing renal and pulmonar issues 2.  B12, TSH, ammonia 3.  No further neurologic intervention is recommended at this time.  If further questions arise, please call or page at that time.  Thank you for allowing neurology to participate in the care of this patient.   Thana FarrLeslie Maximos Zayas, MD Neurology [phone] 02/28/2017, 1:20 PM

## 2017-02-28 NOTE — Plan of Care (Signed)
Problem: Education: Goal: Knowledge of [facility] Education information/materials will improve Outcome: Not Progressing Pt confused  Problem: Health Behavior/Discharge Planning: Goal: Ability to manage health-related needs will improve Outcome: Not Progressing Pt requires assistance   

## 2017-02-28 NOTE — Progress Notes (Signed)
[facility] [place] [place] 02/28/17  Subjective:   Doing better today but more lethargic  Breathing is better S Creatinine is same as yesterday at 2.99   Objective:  Vital signs in last 24 hours:  Temp:  [97.7 F (36.5 C)-99.1 F (37.3 C)] 97.7 F (36.5 C) (05/12 0536) Pulse Rate:  [70-134] 70 (05/12 0536) Resp:  [18-20] 18 (05/12 0536) BP: (113-150)/(58-134) 130/63 (05/12 0536) SpO2:  [95 %-99 %] 95 % (05/12 0536) Weight:  [62.6 kg (138 lb 1.6 oz)] 62.6 kg (138 lb 1.6 oz) (05/12 0536)  Weight change: -0.862 kg (-1 lb 14.4 oz) Filed Weights   02/26/17 2325 02/27/17 0407 02/28/17 0536  Weight: 61.9 kg (136 lb 8 oz) 61.3 kg (135 lb 1.6 oz) 62.6 kg (138 lb 1.6 oz)    Intake/Output:    Intake/Output Summary (Last 24 hours) at 02/28/17 1231 Last data filed at 02/28/17 0930  Gross per 24 hour  Intake              120 ml  Output                0 ml  Net              120 ml     Physical Exam: General: NAD, laying in bed  HEENT Mouth open, dry mucus membranes  Neck supple  Pulm/lungs Normal effort, decreased breath sounds at bases, no wheezing  CVS/Heart Tachycardic, irregular  Abdomen:  Soft, NT  Extremities: No edema  Neurologic: Lethargic, minimally arousable, nods yes/no to few questions             Basic Metabolic Panel:   Recent Labs Lab 02/26/17 1807 02/27/17 0529 02/28/17 0349  NA 137 141 145  K 5.4* 4.8 4.6  CL 106 113* 114*  CO2 22 23 23   GLUCOSE 89 86 66  BUN 34* 33* 32*  CREATININE 3.17* 2.98* 2.99*  CALCIUM 8.4* 8.0* 8.1*     CBC:  Recent Labs Lab 02/26/17 1807 02/27/17 0529 02/28/17 0349  WBC 6.0 4.9 3.8  NEUTROABS 4.6  --   --   HGB 8.1* 7.6* 7.9*  HCT 24.6* 22.9* 23.7*  MCV 88.0 87.6 90.1  PLT 111* 99* 95*     No results found for: HEPBSAG, HEPBSAB, HEPBIGM    Microbiology:  No results found for this or any previous visit (from the past 240 hour(s)).  Coagulation Studies: No results for  input(s): LABPROT, INR in the last 72 hours.  Urinalysis:  Recent Labs  02/26/17 1713  COLORURINE YELLOW*  LABSPEC 1.014  PHURINE 5.0  GLUCOSEU NEGATIVE  HGBUR NEGATIVE  BILIRUBINUR NEGATIVE  KETONESUR NEGATIVE  PROTEINUR NEGATIVE  NITRITE NEGATIVE  LEUKOCYTESUR NEGATIVE      Imaging: [place] [facility]  Result Date: 02/27/2017 CLINICAL DATA:  Acute on chronic renal failure EXAM: RENAL / URINARY TRACT ULTRASOUND COMPLETE COMPARISON:  None. FINDINGS: Right Kidney: Length: 8.9 cm. Increased echogenicity is noted. A 3.0 cm cyst is noted in the upper pole of the right kidney. A smaller adjacent cyst is noted as well as small cysts in the lower pole. Left Kidney: Length: 8.3 cm.  2.1 cm lower pole cyst in the left kidney. Bladder: Decompressed IMPRESSION: Bilateral renal cysts. Electronically Signed   By: Alcide CleverMark  Lukens M.D.   On: 02/27/2017 12:43     Medications:    . acidophilus  1 capsule Oral BID  . amLODipine  10 mg Oral Daily  . atorvastatin  10 mg Oral QHS  . carbidopa-levodopa  1 tablet Oral TID  . diclofenac sodium  2 g Topical BID  . donepezil  10 mg Oral Daily  . escitalopram  10 mg Oral Daily  . ferrous sulfate  325 mg Oral BID  . levETIRAcetam  250 mg Oral Daily  . mirtazapine  15 mg Oral QHS  . multivitamin-lutein   Oral Daily  . mupirocin cream   Topical Daily  . pantoprazole  40 mg Oral Daily  . QUEtiapine  25 mg Oral QHS  . vitamin B-12  1,000 mcg Oral Daily   acetaminophen **OR** acetaminophen, cyclobenzaprine, ipratropium-albuterol, ondansetron **OR** ondansetron (ZOFRAN) IV  Assessment/ Plan:  83 y.o.[place] female with medical problems of Parkinson's disease, dementia,stroke, depression, pulmonary hypertension, who was admitted to [facility] on 02/26/2017 for evaluation of mental status changes status post fall.    1. Acute renal failure. Unclear cause but likely from hemodynamic instability  Patient does not appear to be volume depleted.  No need for IV fluids  especially in light of pulmonary hypertension Renal ultrasound is negative for obstruction.  Simple renal cysts were noted Maintain hemodynamic stability Avoid hypotension Avoid nephrotoxic agents  2. Chronic kidney disease stage IV - baseline creatinine 1.82/GFR 25 Underlying CKD is likely secondary to atherosclerosis We'll continue to monitor   LOS: 2 [facility] 5/12/201812:31 PM  [facility] [place], [place] [phone]

## 2017-02-28 NOTE — Consult Note (Signed)
Reason for Consult: Hemoccult positive Referring Physician: Dr. Glynis Smiles Yackley is an 83 y.o. female.  HPI: Seen in consultation at the request of Dr. Ether Griffins for further evaluation of hemoccult positive stools. The history is obtained through the patient's bedside nurse and review of her electronic health record. The patient is unable to provide any meaningful input due to her dementia. In fact, this morning she is not even answering my questions. She is routinely asking for a sip of water.  She was admitted 02/26/17 after worsening confusion and a fall. She was found to have acute kidney injury. Her initial hemoglobin was 10.5 and it fell to 7.7 with hydration. Today it is 8.1. Additional labs show an iron of 27, ferritin of 176, TIBC 199. In the ER she was found to be hemoccult positive.  The nursing staff has not noticed any GI complaints. There has been no overt GI blood loss. There are no known chronic GI complaints. I can find no records of prior endoscopy.  Past Medical History:  Diagnosis Date  . Apraxia following cerebral infarction   . CKD (chronic kidney disease), stage III    a. With acute worsening/AKI noted on several admissions.  . Dementia   . Dementia without behavioral disturbance   . Depression   . Encephalopathy   . Hyperkalemia    a. 08/2015  . Hyperlipidemia   . Hyperlipidemia   . Hypertension   . Iron deficiency anemia   . Major depressive disorder   . Muscle weakness   . Parkinson disease ([place])   . Parkinson's disease ([place])   . Pericardial effusion    a. 06/2016 Echo: Ef 60-65%, no rwma, Gr1 DD, mild MR, mildly dil LA, PASP 62mHg, mod circumferential pericardial effusion - no hemodynamic compromise.  . Sinus bradycardia    a. 08/2015 in setting of beta blocker and hyperkalemia.  [place]   . TIA (transient ischemic attack)    a. 08/2015 - essentially neg w/u for stroke.  .Marland KitchenUTI (urinary tract infection)     Past Surgical History:  Procedure  Laterality Date  . ABDOMINAL HYSTERECTOMY    . APPENDECTOMY    . CHOLECYSTECTOMY      Family History  Problem Relation Age of Onset  . Heart disease Mother   . Hypertension Other     Social History:  reports that she has never smoked. She has never used smokeless tobacco. She reports that she does not drink alcohol or use drugs.  Allergies:  Allergies  Allergen Reactions  . Penicillins Other (See Comments)    Reaction:  Unknown   . Plavix [Clopidogrel Bisulfate] Other (See Comments)    Reaction:  Unknown     Medications:  I have reviewed the patient's current medications. Prior to Admission:  Prescriptions Prior to Admission  Medication Sig Dispense Refill Last Dose  . acetaminophen (TYLENOL) 325 MG tablet Take 650 mg by mouth every 6 (six) hours as needed.   prn at prn  . acidophilus (RISAQUAD) CAPS capsule Take 1 capsule by mouth 2 (two) times daily.   02/26/2017 at 0900  . amLODipine (NORVASC) 10 MG tablet Take 1 tablet (10 mg total) by mouth daily. 30 tablet 5 02/25/2017 at 0900  . aspirin EC 325 MG tablet Take 1 tablet (325 mg total) by mouth daily. 30 tablet 3 02/26/2017 at 0800  . atorvastatin (LIPITOR) 10 MG tablet Take 10 mg by mouth at bedtime.   02/25/2017 at 2000  . carbidopa-levodopa (  SINEMET IR) 25-100 MG tablet Take 1 tablet by mouth 3 (three) times daily.   02/26/2017 at 1400  . cyclobenzaprine (FLEXERIL) 10 MG tablet Take 10 mg by mouth 3 (three) times daily as needed for muscle spasms.   prn at prn  . diclofenac sodium (VOLTAREN) 1 % GEL Apply 2 g topically 2 (two) times daily. Pt applies to left knee.   02/26/2017 at 0900  . donepezil (ARICEPT) 10 MG tablet Take 10 mg by mouth daily.   02/25/2017 at 2000  . escitalopram (LEXAPRO) 10 MG tablet Take 10 mg by mouth daily.   02/26/2017 at 0800  . ferrous sulfate 325 (65 FE) MG tablet Take 325 mg by mouth 2 (two) times daily.   02/26/2017 at 0900  . levETIRAcetam (KEPPRA) 250 MG tablet Take 250 mg by mouth daily.    02/26/2017  at 0800  . miconazole (BAZA ANTIFUNGAL) 2 % cream Apply 1 application topically 2 (two) times daily as needed. To sacrum every shift for redness after each toilet   prn at prn  . mirtazapine (REMERON) 15 MG tablet Take 15 mg by mouth at bedtime.   02/25/2017 at 2000  . Multiple Vitamins-Minerals (PRESERVISION AREDS 2 PO) Take 2 capsules by mouth daily.   02/26/2017 at 0800  . QUEtiapine (SEROQUEL) 25 MG tablet Take 25 mg by mouth at bedtime.   02/25/2017 at 2000  . Soft Lens Products (EQ SALINE SOLUTION/SENSITIVE) SOLN Apply 1 application topically every other day. Apply to neck topically once every other day for wound until healed.   02/26/2017 at 0900  . sulfamethoxazole-trimethoprim (BACTRIM DS,SEPTRA DS) 800-160 MG tablet Take 1 tablet by mouth 2 (two) times daily.   02/26/2017 at 0800  . vitamin B-12 (CYANOCOBALAMIN) 1000 MCG tablet Take 1,000 mcg by mouth daily.   02/26/2017 at 0900   Scheduled: . acidophilus  1 capsule Oral BID  . amLODipine  10 mg Oral Daily  . atorvastatin  10 mg Oral QHS  . carbidopa-levodopa  1 tablet Oral TID  . diclofenac sodium  2 g Topical BID  . donepezil  10 mg Oral Daily  . escitalopram  10 mg Oral Daily  . ferrous sulfate  325 mg Oral BID  . levETIRAcetam  250 mg Oral Daily  . mirtazapine  15 mg Oral QHS  . multivitamin-lutein   Oral Daily  . mupirocin cream   Topical Daily  . pantoprazole  40 mg Oral Daily  . QUEtiapine  25 mg Oral QHS  . vitamin B-12  1,000 mcg Oral Daily   Continuous:  HVF:[id number] **OR** acetaminophen, cyclobenzaprine, ipratropium-albuterol, ondansetron **OR** ondansetron (ZOFRAN) IV  Results for orders placed or performed during the hospital encounter of 02/26/17 (from the past 48 hour(s))  Urinalysis, Complete w Microscopic     Status: Abnormal   Collection Time: 02/26/17  5:13 PM  Result Value Ref Range   Color, Urine YELLOW (A) YELLOW   APPearance CLEAR (A) CLEAR   Specific Gravity, Urine 1.014 1.005 - 1.030   pH 5.0 5.0 -  8.0   Glucose, UA NEGATIVE NEGATIVE mg/dL   Hgb urine dipstick NEGATIVE NEGATIVE   Bilirubin Urine NEGATIVE NEGATIVE   Ketones, ur NEGATIVE NEGATIVE mg/dL   Protein, ur NEGATIVE NEGATIVE mg/dL   Nitrite NEGATIVE NEGATIVE   Leukocytes, UA NEGATIVE NEGATIVE   RBC / HPF 0-5 0 - 5 RBC/hpf   WBC, UA 0-5 0 - 5 WBC/hpf   Bacteria, UA NONE SEEN NONE SEEN   Squamous Epithelial /  LPF 0-5 (A) NONE SEEN   Amorphous Crystal PRESENT   CBC with Differential     Status: Abnormal   Collection Time: 02/26/17  6:07 PM  Result Value Ref Range   WBC 6.0 3.6 - 11.0 K/uL   RBC 2.79 (L) 3.80 - 5.20 MIL/uL   Hemoglobin 8.1 (L) 12.0 - 16.0 g/dL   HCT 24.6 (L) 35.0 - 47.0 %   MCV 88.0 80.0 - 100.0 fL   MCH 28.8 26.0 - 34.0 pg   MCHC 32.8 32.0 - 36.0 g/dL   RDW 16.5 (H) 11.5 - 14.5 %   Platelets 111 (L) 150 - 440 K/uL   Neutrophils Relative % 77 %   Neutro Abs 4.6 1.4 - 6.5 K/uL   Lymphocytes Relative 12 %   Lymphs Abs 0.7 (L) 1.0 - 3.6 K/uL   Monocytes Relative 9 %   Monocytes Absolute 0.5 0.2 - 0.9 K/uL   Eosinophils Relative 1 %   Eosinophils Absolute 0.0 0 - 0.7 K/uL   Basophils Relative 1 %   Basophils Absolute 0.1 0 - 0.1 K/uL  Basic metabolic panel     Status: Abnormal   Collection Time: 02/26/17  6:07 PM  Result Value Ref Range   Sodium 137 135 - 145 mmol/L   Potassium 5.4 (H) 3.5 - 5.1 mmol/L   Chloride 106 101 - 111 mmol/L   CO2 22 22 - 32 mmol/L   Glucose, Bld 89 65 - 99 mg/dL   BUN 34 (H) 6 - 20 mg/dL   Creatinine, Ser 3.17 (H) 0.44 - 1.00 mg/dL   Calcium 8.4 (L) 8.9 - 10.3 mg/dL   GFR calc non Af Amer 13 (L) >60 mL/min   GFR calc Af Amer 15 (L) >60 mL/min    Comment: (NOTE) The eGFR has been calculated using the CKD EPI equation. This calculation has not been validated in all clinical situations. eGFR's persistently <60 mL/min signify possible Chronic Kidney Disease.    Anion gap 9 5 - 15  Troponin I     Status: None   Collection Time: 02/26/17  6:07 PM  Result Value Ref  Range   Troponin I <0.03 <0.03 ng/mL  Troponin I     Status: None   Collection Time: 02/26/17 11:59 PM  Result Value Ref Range   Troponin I <0.03 <0.03 ng/mL  Troponin I     Status: Abnormal   Collection Time: 02/27/17  5:29 AM  Result Value Ref Range   Troponin I 0.03 (HH) <0.03 ng/mL    Comment: CRITICAL RESULT CALLED TO, READ BACK BY AND VERIFIED WITH OLIVIA BOATNER AT 9169 ON 02/27/17 [place].   Basic metabolic panel     Status: Abnormal   Collection Time: 02/27/17  5:29 AM  Result Value Ref Range   Sodium 141 135 - 145 mmol/L   Potassium 4.8 3.5 - 5.1 mmol/L   Chloride 113 (H) 101 - 111 mmol/L   CO2 23 22 - 32 mmol/L   Glucose, Bld 86 65 - 99 mg/dL   BUN 33 (H) 6 - 20 mg/dL   Creatinine, Ser 2.98 (H) 0.44 - 1.00 mg/dL   Calcium 8.0 (L) 8.9 - 10.3 mg/dL   GFR calc non Af Amer 14 (L) >60 mL/min   GFR calc Af Amer 16 (L) >60 mL/min    Comment: (NOTE) The eGFR has been calculated using the CKD EPI equation. This calculation has not been validated in all clinical situations. eGFR's persistently <60 mL/min signify possible  Chronic Kidney Disease.    Anion gap 5 5 - 15  CBC     Status: Abnormal   Collection Time: 02/27/17  5:29 AM  Result Value Ref Range   WBC 4.9 3.6 - 11.0 K/uL   RBC 2.61 (L) 3.80 - 5.20 MIL/uL   Hemoglobin 7.6 (L) 12.0 - 16.0 g/dL   HCT 22.9 (L) 35.0 - 47.0 %   MCV 87.6 80.0 - 100.0 fL   MCH 29.0 26.0 - 34.0 pg   MCHC 33.1 32.0 - 36.0 g/dL   RDW 16.9 (H) 11.5 - 14.5 %   Platelets 99 (L) 150 - 440 K/uL  Glucose, capillary     Status: None   Collection Time: 02/27/17  7:44 AM  Result Value Ref Range   Glucose-Capillary 72 65 - 99 mg/dL  Troponin I     Status: Abnormal   Collection Time: 02/27/17 11:07 AM  Result Value Ref Range   Troponin I 0.03 (HH) <0.03 ng/mL    Comment: CRITICAL VALUE NOTED. VALUE IS CONSISTENT WITH PREVIOUSLY REPORTED/CALLED VALUE...[place]  Iron and TIBC     Status: Abnormal   Collection Time: 02/27/17 11:07 AM  Result Value Ref  Range   Iron 27 (L) 28 - 170 ug/dL   TIBC 199 (L) 250 - 450 ug/dL   Saturation Ratios 14 10.4 - 31.8 %   UIBC 172 ug/dL  Ferritin     Status: None   Collection Time: 02/27/17 11:07 AM  Result Value Ref Range   Ferritin 176 11 - 307 ng/mL  Basic metabolic panel     Status: Abnormal   Collection Time: 02/28/17  3:49 AM  Result Value Ref Range   Sodium 145 135 - 145 mmol/L   Potassium 4.6 3.5 - 5.1 mmol/L   Chloride 114 (H) 101 - 111 mmol/L   CO2 23 22 - 32 mmol/L   Glucose, Bld 66 65 - 99 mg/dL   BUN 32 (H) 6 - 20 mg/dL   Creatinine, Ser 2.99 (H) 0.44 - 1.00 mg/dL   Calcium 8.1 (L) 8.9 - 10.3 mg/dL   GFR calc non Af Amer 14 (L) >60 mL/min   GFR calc Af Amer 16 (L) >60 mL/min    Comment: (NOTE) The eGFR has been calculated using the CKD EPI equation. This calculation has not been validated in all clinical situations. eGFR's persistently <60 mL/min signify possible Chronic Kidney Disease.    Anion gap 8 5 - 15  CBC     Status: Abnormal   Collection Time: 02/28/17  3:49 AM  Result Value Ref Range   WBC 3.8 3.6 - 11.0 K/uL   RBC 2.63 (L) 3.80 - 5.20 MIL/uL   Hemoglobin 7.9 (L) 12.0 - 16.0 g/dL   HCT 23.7 (L) 35.0 - 47.0 %   MCV 90.1 80.0 - 100.0 fL   MCH 30.0 26.0 - 34.0 pg   MCHC 33.3 32.0 - 36.0 g/dL   RDW 17.1 (H) 11.5 - 14.5 %   Platelets 95 (L) 150 - 440 K/uL  CK     Status: None   Collection Time: 02/28/17  3:49 AM  Result Value Ref Range   Total CK 150 38 - 234 U/L  Glucose, capillary     Status: Abnormal   Collection Time: 02/28/17  7:41 AM  Result Value Ref Range   Glucose-Capillary 56 (L) 65 - 99 mg/dL   Comment 1 Notify RN   Glucose, capillary     Status: None  Collection Time: 02/28/17  8:21 AM  Result Value Ref Range   Glucose-Capillary 86 65 - 99 mg/dL   Comment 1 Notify RN   Blood gas, arterial     Status: Abnormal   Collection Time: 02/28/17 10:19 AM  Result Value Ref Range   FIO2 0.32    pH, Arterial 7.28 (L) 7.350 - 7.450   pCO2 arterial 46  32.0 - 48.0 mmHg   pO2, Arterial 75 (L) 83.0 - 108.0 mmHg   Bicarbonate 21.6 20.0 - 28.0 mmol/L   Acid-base deficit 4.9 (H) 0.0 - 2.0 mmol/L   O2 Saturation 92.8 %   Patient temperature 37.0    Collection site LEFT RADIAL    Sample type ARTERIAL DRAW    Allens test (pass/fail) PASS PASS    US Renal  Result Date: 02/27/2017 CLINICAL DATA:  Acute on chronic renal failure EXAM: RENAL / URINARY TRACT ULTRASOUND COMPLETE COMPARISON:  None. FINDINGS: Right Kidney: Length: 8.9 cm. Increased echogenicity is noted. A 3.0 cm cyst is noted in the upper pole of the right kidney. A smaller adjacent cyst is noted as well as small cysts in the lower pole. Left Kidney: Length: 8.3 cm.  2.1 cm lower pole cyst in the left kidney. Bladder: Decompressed IMPRESSION: Bilateral renal cysts. Electronically Signed   By: Inez Catalina M.D.   On: 02/27/2017 12:43    Review of Systems  Unable to perform ROS: Dementia   Blood pressure (!) 117/43, pulse 73, temperature 98.8 F (37.1 C), temperature source Oral, resp. rate 17, height 5' 3"  (1.6 m), weight 138 lb 1.6 oz (62.6 kg), SpO2 96 %. Physical Exam  Constitutional: She appears well-developed.  Agitated. Wearing restraint mitts. Not opening her eyes, answering questions, or following commands.  HENT:  Nose: Nose normal.  Mouth/Throat: No oropharyngeal exudate.  Left periorbital ecchymoses  Eyes: Pupils are equal, round, and reactive to light. Scleral icterus is present.  Neck: Normal range of motion. Neck supple. No thyromegaly present.  Cardiovascular: Normal rate and regular rhythm.   Respiratory: Effort normal and breath sounds normal.  GI: Soft. Bowel sounds are normal. She exhibits no distension and no mass. There is no tenderness.  Musculoskeletal: Normal range of motion. She exhibits edema.  Neurological: She displays normal reflexes. She exhibits normal muscle tone.  Skin: Skin is warm and dry.  Psychiatric:  Unable to assess     Assessment/Plan: Hemoccult positive stools without known overt GI blood loss Progressive normocytic anemia, low iron but normal ferritin, low TIBC Acute renal insufficiency Chronic kidney disease stage IV  No family was present at the time of my evaluation today.  Possible GI bleeding with a noted positive hemoccult but there is no knowledge for overt GI blood loss or evidence for iron deficiency. Her anemia may be multifactorial and I would expect some chronic anemia given her kidney disease.   The overall goals of care remain unclear to me. Given her advanced age and comorbidities, I would not recommend pursuing any endoscopic evaluation without overt bleeding or progressive anemia. I would like to opportunity to discuss endoscopic evaluation with the patient's family to determine how aggressive they would like her care to be. I left me cell phone number with her bedside nurse so that I could speak to family when they arrive today.  Thank you for allowing me to participate in Stacey Baker's care. Please call with any questions or concerns.   [place] 02/28/2017, 3:07 PM

## 2017-03-01 ENCOUNTER — Inpatient Hospital Stay: Payer: Medicare Other

## 2017-03-01 LAB — CBC
HEMATOCRIT: 25.8 % — AB (ref 35.0–47.0)
HEMOGLOBIN: 8.4 g/dL — AB (ref 12.0–16.0)
MCH: 29.6 pg (ref 26.0–34.0)
MCHC: 32.6 g/dL (ref 32.0–36.0)
MCV: 90.7 fL (ref 80.0–100.0)
Platelets: 101 10*3/uL — ABNORMAL LOW (ref 150–440)
RBC: 2.85 MIL/uL — ABNORMAL LOW (ref 3.80–5.20)
RDW: 17.6 % — AB (ref 11.5–14.5)
WBC: 4.2 10*3/uL (ref 3.6–11.0)

## 2017-03-01 LAB — COMPREHENSIVE METABOLIC PANEL
ALBUMIN: 3.5 g/dL (ref 3.5–5.0)
AST: 18 U/L (ref 15–41)
Alkaline Phosphatase: 94 U/L (ref 38–126)
Anion gap: 7 (ref 5–15)
BUN: 30 mg/dL — AB (ref 6–20)
CALCIUM: 8.4 mg/dL — AB (ref 8.9–10.3)
CO2: 22 mmol/L (ref 22–32)
Chloride: 111 mmol/L (ref 101–111)
Creatinine, Ser: 2.59 mg/dL — ABNORMAL HIGH (ref 0.44–1.00)
GFR calc Af Amer: 19 mL/min — ABNORMAL LOW (ref >60)
GFR, EST NON AFRICAN AMERICAN: 16 mL/min — AB (ref >60)
GLUCOSE: 176 mg/dL — AB (ref 65–99)
POTASSIUM: 4.5 mmol/L (ref 3.5–5.1)
SODIUM: 140 mmol/L (ref 135–145)
TOTAL PROTEIN: 6.8 g/dL (ref 6.5–8.1)
Total Bilirubin: 0.5 mg/dL (ref 0.3–1.2)

## 2017-03-01 LAB — VITAMIN B12: Vitamin B-12: 1257 pg/mL — ABNORMAL HIGH (ref 180–914)

## 2017-03-01 LAB — MRSA PCR SCREENING: MRSA by PCR: NEGATIVE

## 2017-03-01 LAB — GLUCOSE, CAPILLARY: Glucose-Capillary: 114 mg/dL — ABNORMAL HIGH (ref 65–99)

## 2017-03-01 LAB — AMMONIA: Ammonia: <9 umol/L — ABNORMAL LOW (ref 9–35)

## 2017-03-01 LAB — LACTIC ACID, PLASMA
LACTIC ACID, VENOUS: 1.1 mmol/L (ref 0.5–1.9)
LACTIC ACID, VENOUS: 0.8 mmol/L (ref 0.5–1.9)

## 2017-03-01 LAB — TSH: TSH: 1.914 u[IU]/mL (ref 0.350–4.500)

## 2017-03-01 MED ORDER — PREDNISONE 50 MG PO TABS
50.0000 mg | ORAL_TABLET | Freq: Every day | ORAL | Status: DC
  Administered 2017-03-01 – 2017-03-03 (×3): 50 mg via ORAL
  Filled 2017-03-01 (×3): qty 1

## 2017-03-01 MED ORDER — FUROSEMIDE 10 MG/ML IJ SOLN
40.0000 mg | Freq: Once | INTRAMUSCULAR | Status: AC
  Administered 2017-03-01: 40 mg via INTRAVENOUS
  Filled 2017-03-01: qty 4

## 2017-03-01 MED ORDER — BUDESONIDE 0.25 MG/2ML IN SUSP
0.2500 mg | Freq: Two times a day (BID) | RESPIRATORY_TRACT | Status: DC
  Administered 2017-03-01 – 2017-03-07 (×12): 0.25 mg via RESPIRATORY_TRACT
  Filled 2017-03-01 (×13): qty 2

## 2017-03-01 MED ORDER — IPRATROPIUM-ALBUTEROL 0.5-2.5 (3) MG/3ML IN SOLN
3.0000 mL | RESPIRATORY_TRACT | Status: DC
  Administered 2017-03-01 – 2017-03-02 (×6): 3 mL via RESPIRATORY_TRACT
  Filled 2017-03-01 (×6): qty 3

## 2017-03-01 NOTE — Progress Notes (Signed)
CC: anemia  Subjective: Since I last evaluated the patient she is more alert and appropriate. I still have difficulty understanding what she is trying to say. She denies any GI complaints.   Objective: Vital signs in last 24 hours: Temp:  [97.6 F (36.4 C)-99.3 F (37.4 C)] 98.4 F (36.9 C) (05/13 1605) Pulse Rate:  [67-85] 70 (05/13 1605) Resp:  [16-17] 16 (05/13 0450) BP: (130-149)/(47-98) 130/47 (05/13 1605) SpO2:  [90 %-98 %] 94 % (05/13 1707) Weight:  [136 lb 11.2 oz (62 kg)] 136 lb 11.2 oz (62 kg) (05/13 0450) Last BM Date: 02/27/17  Intake/Output from previous day: 05/12 0701 - 05/13 0700 In: 330 [P.O.:330] Out: 0  Intake/Output this shift: Total I/O In: 120 [P.O.:120] Out: 0   Physical Exam  Constitutional: Less agitated today. More alert. Cardiovascular: Normal rate and regular rhythm.   Respiratory: Effort normal and breath sounds normal.  GI: Soft. Bowel sounds are normal. She exhibits no distension and no mass. There is no tenderness.   Lab Results:  Recent Labs  02/27/17 0529 02/28/17 0349 03/01/17 0305  WBC 4.9 3.8 4.2  HGB 7.6* 7.9* 8.4*  HCT 22.9* 23.7* 25.8*  PLT 99* 95* 101*   BMET  Recent Labs  02/27/17 0529 02/28/17 0349 03/01/17 1432  NA 141 145 140  K 4.8 4.6 4.5  CL 113* 114* 111  CO2 23 23 22   GLUCOSE 86 66 176*  BUN 33* 32* 30*  CREATININE 2.98* 2.99* 2.59*  CALCIUM 8.0* 8.1* 8.4*   LFT  Recent Labs  03/01/17 1432  PROT 6.8  ALBUMIN 3.5  AST 18  ALT <5*  ALKPHOS 94  BILITOT 0.5   PT/INR No results for input(s): LABPROT, INR in the last 72 hours. Hepatitis Panel No results for input(s): HEPBSAG, HCVAB, HEPAIGM, HEPBIGM in the last 72 hours. C-Diff No results for input(s): CDIFFTOX in the last 72 hours. No results for input(s): CDIFFPCR in the last 72 hours. Fecal Lactopherrin No results for input(s): FECLLACTOFRN in the last 72 hours.  Studies/Results: Dg Chest Port 1 View  Result Date: 03/01/2017 CLINICAL  DATA:  Wheezing. EXAM: PORTABLE CHEST 1 VIEW COMPARISON:  Feb 26, 2017 FINDINGS: No pneumothorax. Stable cardiomegaly. Bilateral layering effusions, left greater than right. No overt edema. Opacity underlying the effusions is likely atelectasis. No other infiltrate. IMPRESSION: New bilateral layering effusions, left greater than right. Underlying opacities are likely atelectasis. Stable cardiomegaly. Electronically Signed   By: Gerome Samavid  Williams III M.D   On: 03/01/2017 10:01    Medications: I have reviewed the patient's current medications.  Assessment/Plan: Hemoccult positive stools without known overt GI blood loss Progressive normocytic anemia, low iron but normal ferritin, low TIBC Acute renal insufficiency Chronic kidney disease stage IV  Her hemoglobin has been stable since arrival. No noted overt bleeding. I continue to suspect that her anemia is multifactorial. Given her comorbidities, advanced age, lack of overt GI blood loss, and unclear goals of care, I am not recommending any endoscopy at this time.   I will move to standby. If the family wishes to pursue an aggressive evaluation with endoscopy, please call the on-call gastroenterologist.    LOS: 3 days   Tressia DanasKimberly Misti Towle 03/01/2017, 6:44 PM

## 2017-03-01 NOTE — Progress Notes (Signed)
    Advance care planning     Present Dr Stacey LegatoVaickute.,  Mrs Stacey Baker, Stacey Baker,  [name], patient's son    Medical problems.  Acute on chronic renal failure,  elevated troponin,  hyperkalemia,  generalized weakness,  anemia,  Confusion,  acute on chronic diastolic CHF Bilateral pleural effusions Wheezing/fluid overload  I discussed patient's medical condition with patient's son, Stacey Baker attorney for medical care, answered all questions, patient's son understands patient's decline and is ready to discuss with his mother her wishes and with palliative care staff to set up an appointment if needed to discuss goals of care . Patient is DO NOT RESUSCITATE per her prior request, confirmed with power of attorney for medical care, patient's son   Time spent 17 minutes

## 2017-03-01 NOTE — Progress Notes (Signed)
Nutrition Brief Note  Calorie count envelope hung in patient's room. Document percent consumed for each item on the patient's meal tray ticket and keep in envelope. Also document percent of any supplement or snack pt consumes and keep documentation in envelope for RD to review.   Stacey AnoWilliam M. Idabell Picking, MS, RD LDN Inpatient Clinical Dietitian Pager [phone]

## 2017-03-01 NOTE — Progress Notes (Signed)
RN Sherian MaroonAbbie assigned to take over pt care for remainder of shift; report given

## 2017-03-01 NOTE — Progress Notes (Signed)
[facility] [place] [facility]   PATIENT NAME: Stacey ManchesterMiriam Diclemente    MR#:  [id number]  DATE OF BIRTH:  09/18/1932  SUBJECTIVE:  CHIEF COMPLAINT:   Chief Complaint  Patient presents with  . Urinary Frequency   Patient is 83 year old Caucasian female with past medical history significant for history of CAD stage III, pericardial, pleural effusions, chronic diastolic CHF, seizures, Parkinson's disease, memory loss, aphasia, who presents to the hospital with confusion. She was noted to have worsening renal failure with creatinine level of 3.2, up from 1.8 baseline. She was also noted to be anemic, she was noted to have slightly positive Hemoccult of stool. She was hydrated on admission, her creatinine level has improved, however hemoglobin level was found to be low at 7.9, no active bleeding was noted. Patient Has difficulty expressing herself today, more somnolent, unable to get review of systems. ABGs revealed mild acidosis, which was  metabolic. Patient was seen by neurologist, ammonia, TSH,vitamin B12 levels were checked, Normal, no further neurologic interventions were recommended.. Patient feels more comfortable today, more alert, she complains of some shortness of breath. Chest x-ray revealed bilateral pleural effusions, left more than right, atelectasis, cardiomegaly. Patient is wheezing today   Review of Systems  Unable to perform ROS: Language  excessive aphasia, expressive aphasia  VITAL SIGNS: Blood pressure (!) 135/52, pulse 67, temperature 98.8 F (37.1 C), resp. rate 16, height 5\' 3"  (1.6 m), weight 62 kg (136 lb 11.2 oz), SpO2 97 %.  PHYSICAL EXAMINATION:   GENERAL:  83 y.o.-year-old patient lying in the bed with no acute distress, more alert, has difficulty to converse due to expressive aphasia.  EYES: Pupils equal, round, reactive to light and accommodation. No scleral icterus. Extraocular muscles intact.  HEENT: Head atraumatic, normocephalic.  Oropharynx and nasopharynx clear. Bruising of her left periorbital area NECK:  Supple, no jugular venous distention. No thyroid enlargement, no tenderness.  LUNGS:  diminished breath sounds bilaterally at bases, especially on the left,left-sided wheezing, few scattered rales,rhonchi , but no crepitations noted due to poor effort. Intermittent use of accessory muscles of respiration.  CARDIOVASCULAR: S1, S2 normal. No murmurs, rubs, or gallops.  ABDOMEN: Soft, nontender, nondistended. Bowel sounds present. No organomegaly or mass.  EXTREMITIES: No pedal edema, cyanosis, or clubbing.  NEUROLOGIC: Cranial nerves II through XII are intact. Muscle strength 5/5 in all extremities. Sensation intact. Gait not checked.  PSYCHIATRIC: The patient is alert,  Unable to assess orientation due to expressive aphasia Skin no obvious lesion, or ulcer.   ORDERS/RESULTS REVIEWED:   CBC  Recent Labs Lab 02/26/17 1807 02/27/17 0529 02/28/17 0349 03/01/17 0305  WBC 6.0 4.9 3.8 4.2  HGB 8.1* 7.6* 7.9* 8.4*  HCT 24.6* 22.9* 23.7* 25.8*  PLT 111* 99* 95* 101*  MCV 88.0 87.6 90.1 90.7  MCH 28.8 29.0 30.0 29.6  MCHC 32.8 33.1 33.3 32.6  RDW 16.5* 16.9* 17.1* 17.6*  LYMPHSABS 0.7*  --   --   --   MONOABS 0.5  --   --   --   EOSABS 0.0  --   --   --   BASOSABS 0.1  --   --   --    ------------------------------------------------------------------------------------------------------------------  Chemistries   Recent Labs Lab 02/26/17 1807 02/27/17 0529 02/28/17 0349  NA 137 141 145  K 5.4* 4.8 4.6  CL 106 113* 114*  CO2 22 23 23   GLUCOSE 89 86 66  BUN 34* 33* 32*  CREATININE 3.17* 2.98*  2.99*  CALCIUM 8.4* 8.0* 8.1*   ------------------------------------------------------------------------------------------------------------------ estimated creatinine clearance is 12 mL/min (A) (by C-G formula based on SCr of 2.99 mg/dL  (H)). ------------------------------------------------------------------------------------------------------------------  Recent Labs  03/01/17 0305  TSH 1.914    Cardiac Enzymes  Recent Labs Lab 02/26/17 2359 02/27/17 0529 02/27/17 1107  TROPONINI <0.03 0.03* 0.03*   ------------------------------------------------------------------------------------------------------------------ Invalid input(s): POCBNP ---------------------------------------------------------------------------------------------------------------  RADIOLOGY: US Renal  Result Date: 02/27/2017 CLINICAL DATA:  Acute on chronic renal failure EXAM: RENAL / URINARY TRACT ULTRASOUND COMPLETE COMPARISON:  None. FINDINGS: Right Kidney: Length: 8.9 cm. Increased echogenicity is noted. A 3.0 cm cyst is noted in the upper pole of the right kidney. A smaller adjacent cyst is noted as well as small cysts in the lower pole. Left Kidney: Length: 8.3 cm.  2.1 cm lower pole cyst in the left kidney. Bladder: Decompressed IMPRESSION: Bilateral renal cysts. Electronically Signed   By: Alcide Clever M.D.   On: 02/27/2017 12:43   Dg Chest Port 1 View  Result Date: 03/01/2017 CLINICAL DATA:  Wheezing. EXAM: PORTABLE CHEST 1 VIEW COMPARISON:  Feb 26, 2017 FINDINGS: No pneumothorax. Stable cardiomegaly. Bilateral layering effusions, left greater than right. No overt edema. Opacity underlying the effusions is likely atelectasis. No other infiltrate. IMPRESSION: New bilateral layering effusions, left greater than right. Underlying opacities are likely atelectasis. Stable cardiomegaly. Electronically Signed   By: Gerome Sam III M.D   On: 03/01/2017 10:01    EKG:  Orders placed or performed during the hospital encounter of 02/26/17  . ED EKG  . ED EKG    ASSESSMENT AND PLAN:  Active Problems:   Generalized weakness   Acute on chronic renal failure (HCC)   Anemia   Guaiac positive stools   Fall  #1. Acute on chronic renal  failure, Bladder residual was only 10 cc, initially improved with IV fluid administration, however, now patient is wheezing, is fluid overloaded, and received a intravenous Lasix,follow creatinine in the morning, renal ultrasound revealed bilateral renal cysts. Urinalysis was unremarkable, unlikely UTI . The patient was some acidotic, metabolic acidosis suspected related to renal failure. CK levels is normal.   #2. Elevated troponin, likely demand ischemia, echocardiogram revealed normal ejection fraction, diastolic dysfunction, moderate tricuspid regurgitation, pulmonary hypertension, initiating Lasix #3. Hyperkalemia, resolved with Kayexalate and improvement of kidney function , following closely #4. Generalized weakness, fall,  physical therapist saw patient in consultation and recommended 24-hour supervision/assistance, speech therapist evaluated patient, recommended dysphagia 3 diet with thin liquids, aspiration precautions , getting palliative care involved  to discuss goals of care with the patient and her family #5. Hemoccult positive stool, anemia, iron studies, ferritin revealed anemia of chronic disease, suspect combination of chronic blood loss anemia and anemia of chronic disease due to renal failure, gastroenterologist consultation was requested, continue Protonix , no active bleeding, hemoglobin remains stable #7. Confusion, etiology is unclear, appreciate neurologist input, TSH, ammonia, B12 levels are normal, no further diagnostics were recommended by Dr. Thad Ranger #8. Acidosis, metabolic, recheck BMP in the morning, get lactic acid level  Management plans discussed with the patient, family and they are in agreement.   DRUG ALLERGIES:  Allergies  Allergen Reactions  . Penicillins Other (See Comments)    Reaction:  Unknown   . Plavix [Clopidogrel Bisulfate] Other (See Comments)    Reaction:  Unknown     CODE STATUS:     Code Status Orders        Start     Ordered   02/26/17  2326  Do not attempt resuscitation (DNR)  Continuous    Question Answer Comment  In the event of cardiac or respiratory ARREST Do not call a "code blue"   In the event of cardiac or respiratory ARREST Do not perform Intubation, CPR, defibrillation or ACLS   In the event of cardiac or respiratory ARREST Use medication by any route, position, wound care, and other measures to relive pain and suffering. May use oxygen, suction and manual treatment of airway obstruction as needed for comfort.      02/26/17 2325    Code Status History    Date Active Date Inactive Code Status Order ID Comments User Context   07/05/2016  5:47 PM 07/09/2016  7:51 PM Full Code [id number]  Marguarite Arbour, MD ED   07/03/2016 11:04 AM 07/03/2016  5:26 PM Full Code [id number]  Milagros Loll, MD ED   12/31/2015  5:42 PM 01/06/2016  7:12 PM Full Code [id number]  [name], MD Inpatient   09/13/2015 10:55 PM 09/15/2015  8:25 PM Full Code [id number]  Oralia Manis, MD Inpatient   08/28/2015  8:14 AM 08/29/2015  4:49 PM DNR [id number]  Delfino Lovett, MD Inpatient   08/27/2015 11:25 PM 08/28/2015  8:14 AM Full Code [id number]  Hower, Cletis Athens, MD ED    Advance Directive Documentation     Most Recent Value  Type of Advance Directive  Out of facility DNR (pink MOST or yellow form)  Pre-existing out of facility DNR order (yellow form or pink MOST form)  -  "MOST" Form in Place?  -      TOTAL TIME TAKING CARE OF THIS PATIENT 35 minutes.    Katharina Caper M.D on 03/01/2017 at 12:00 PM  Between 7am to 6pm - Pager - [phone]  After 6pm go to [email or web address] - password [email or web address]  [facility]  Office  [phone]  CC: Primary care physician; Merlene Pulling, PA-C

## 2017-03-01 NOTE — Plan of Care (Signed)
Problem: Education: Goal: Knowledge of [facility] Education information/materials will improve Outcome: Not Progressing Pt confused   

## 2017-03-01 NOTE — Plan of Care (Signed)
Problem: Education: Goal: Knowledge of [facility] Education information/materials will improve Outcome: Not Progressing Pt confused  Problem: Health Behavior/Discharge Planning: Goal: Ability to manage health-related needs will improve Outcome: Not Progressing Pt requires assistance   

## 2017-03-01 NOTE — Progress Notes (Signed)
Order for tele has run out. Per Dr. Winona LegatoVaickute okay to remove from pt, will discontinue order.

## 2017-03-02 ENCOUNTER — Inpatient Hospital Stay: Payer: Medicare Other

## 2017-03-02 ENCOUNTER — Inpatient Hospital Stay (HOSPITAL_COMMUNITY): Payer: Medicare Other

## 2017-03-02 DIAGNOSIS — R58 Hemorrhage, not elsewhere classified: Secondary | ICD-10-CM

## 2017-03-02 LAB — BASIC METABOLIC PANEL
ANION GAP: 10 (ref 5–15)
BUN: 30 mg/dL — ABNORMAL HIGH (ref 6–20)
CALCIUM: 8.7 mg/dL — AB (ref 8.9–10.3)
CO2: 22 mmol/L (ref 22–32)
Chloride: 107 mmol/L (ref 101–111)
Creatinine, Ser: 2.45 mg/dL — ABNORMAL HIGH (ref 0.44–1.00)
GFR, EST AFRICAN AMERICAN: 20 mL/min — AB (ref >60)
GFR, EST NON AFRICAN AMERICAN: 17 mL/min — AB (ref >60)
Glucose, Bld: 149 mg/dL — ABNORMAL HIGH (ref 65–99)
Potassium: 5.1 mmol/L (ref 3.5–5.1)
Sodium: 139 mmol/L (ref 135–145)

## 2017-03-02 LAB — GLUCOSE, CAPILLARY: GLUCOSE-CAPILLARY: 122 mg/dL — AB (ref 65–99)

## 2017-03-02 LAB — CBC
HCT: 29.6 % — ABNORMAL LOW (ref 35.0–47.0)
HEMOGLOBIN: 9.8 g/dL — AB (ref 12.0–16.0)
MCH: 29.5 pg (ref 26.0–34.0)
MCHC: 33.1 g/dL (ref 32.0–36.0)
MCV: 89.4 fL (ref 80.0–100.0)
Platelets: 107 10*3/uL — ABNORMAL LOW (ref 150–440)
RBC: 3.31 MIL/uL — AB (ref 3.80–5.20)
RDW: 16.9 % — ABNORMAL HIGH (ref 11.5–14.5)
WBC: 2.8 10*3/uL — ABNORMAL LOW (ref 3.6–11.0)

## 2017-03-02 MED ORDER — FUROSEMIDE 10 MG/ML IJ SOLN
40.0000 mg | Freq: Once | INTRAMUSCULAR | Status: AC
  Administered 2017-03-02: 40 mg via INTRAVENOUS
  Filled 2017-03-02: qty 4

## 2017-03-02 MED ORDER — IPRATROPIUM-ALBUTEROL 0.5-2.5 (3) MG/3ML IN SOLN
3.0000 mL | Freq: Three times a day (TID) | RESPIRATORY_TRACT | Status: DC
  Administered 2017-03-02 – 2017-03-03 (×2): 3 mL via RESPIRATORY_TRACT
  Filled 2017-03-02 (×2): qty 3

## 2017-03-02 MED ORDER — ENSURE ENLIVE PO LIQD
237.0000 mL | Freq: Three times a day (TID) | ORAL | Status: DC
  Administered 2017-03-02 – 2017-03-03 (×2): 237 mL via ORAL

## 2017-03-02 NOTE — Care Management Important Message (Signed)
Important Message  Patient Details  Name: Stacey Baker MRN: [id number] Date of Birth: 06/17/1932   Medicare Important Message Given:  No Patient confused. Family not at bedside    Chapman FitchBOWEN, [place] Silveria T, RN 03/02/2017, 4:11 PM

## 2017-03-02 NOTE — Progress Notes (Signed)
PT Cancellation Note  Patient Details Name: Stacey Baker MRN: [id number] DOB: 11/14/1931   Cancelled Treatment:    Reason Eval/Treat Not Completed: Patient at procedure or test/unavailable. Treatment attempted; pt not available. Re attempt at a later date as the schedule allows.    Scot DockHeidi E Barnes, PTA 03/02/2017, 2:41 PM

## 2017-03-02 NOTE — Progress Notes (Signed)
[facility], [place] 03/02/17  Subjective:  No new renal function testing this a.m. Patient awake and alert but appears to be confused at times.   Objective:  Vital signs in last 24 hours:  Temp:  [97.6 F (36.4 C)-98.4 F (36.9 C)] 98.2 F (36.8 C) (05/14 0434) Pulse Rate:  [70-85] 80 (05/14 0434) Resp:  [18] 18 (05/14 0434) BP: (130-143)/(47-95) 141/57 (05/14 0434) SpO2:  [94 %-98 %] 96 % (05/14 0434) Weight:  [63.5 kg (140 lb)] 63.5 kg (140 lb) (05/14 0500)  Weight change: 1.497 kg (3 lb 4.8 oz) Filed Weights   02/28/17 0536 03/01/17 0450 03/02/17 0500  Weight: 62.6 kg (138 lb 1.6 oz) 62 kg (136 lb 11.2 oz) 63.5 kg (140 lb)    Intake/Output:    Intake/Output Summary (Last 24 hours) at 03/02/17 1437 Last data filed at 03/02/17 0942  Gross per 24 hour  Intake              480 ml  Output                0 ml  Net              480 ml     Physical Exam: General: NAD, laying in bed  HEENT Left periorbital ecchymosis, OM moist  Neck supple  Pulm/lungs Normal effort, CTAB  CVS/Heart S1S2 no rubs  Abdomen:  Soft, NTND  Extremities: No edema  Neurologic: Awake, alert, confused.             Basic Metabolic Panel:   Recent Labs Lab 02/26/17 1807 02/27/17 0529 02/28/17 0349 03/01/17 1432  NA 137 141 145 140  K 5.4* 4.8 4.6 4.5  CL 106 113* 114* 111  CO2 22 23 23 22   GLUCOSE 89 86 66 176*  BUN 34* 33* 32* 30*  CREATININE 3.17* 2.98* 2.99* 2.59*  CALCIUM 8.4* 8.0* 8.1* 8.4*     CBC:  Recent Labs Lab 02/26/17 1807 02/27/17 0529 02/28/17 0349 03/01/17 0305 03/02/17 0419  WBC 6.0 4.9 3.8 4.2 2.8*  NEUTROABS 4.6  --   --   --   --   HGB 8.1* 7.6* 7.9* 8.4* 9.8*  HCT 24.6* 22.9* 23.7* 25.8* 29.6*  MCV 88.0 87.6 90.1 90.7 89.4  PLT 111* 99* 95* 101* 107*     No results found for: HEPBSAG, HEPBSAB, HEPBIGM    Microbiology:  Recent Results (from the past 240 hour(s))  MRSA PCR Screening     Status: None    Collection Time: 03/01/17  5:05 PM  Result Value Ref Range Status   MRSA by PCR NEGATIVE NEGATIVE Final    Comment:        The GeneXpert MRSA Assay (FDA approved for NASAL specimens only), is one component of a comprehensive MRSA colonization surveillance program. It is not intended to diagnose MRSA infection nor to guide or monitor treatment for MRSA infections.     Coagulation Studies: No results for input(s): LABPROT, INR in the last 72 hours.  Urinalysis: No results for input(s): COLORURINE, LABSPEC, PHURINE, GLUCOSEU, HGBUR, BILIRUBINUR, KETONESUR, PROTEINUR, UROBILINOGEN, NITRITE, LEUKOCYTESUR in the last 72 hours.  Invalid input(s): APPERANCEUR    Imaging: Mr Brain Wo Contrast  Result Date: 03/02/2017 CLINICAL DATA:  83 year old female with confusion. Acute on chronic renal failure, anemia. EXAM: MRI HEAD WITHOUT CONTRAST TECHNIQUE: Multiplanar, multiecho pulse sequences of the brain and surrounding structures were obtained without intravenous contrast. COMPARISON:  Head CT without contrast 02/26/2017. Brain  MRI 01/31/2017, and earlier. FINDINGS: Brain: Stable cerebral volume. No restricted diffusion to suggest acute infarction. No midline shift, mass effect, evidence of mass lesion, ventriculomegaly, extra-axial collection or acute intracranial hemorrhage. Cervicomedullary junction and pituitary are within normal limits. Stable gray and white matter signal throughout the brain. Scattered nonspecific cerebral white matter T2 and FLAIR hyperintensity. No cortical encephalomalacia arch that no cortical encephalomalacia or chronic cerebral blood products are identified. Deep gray matter nuclei, brainstem, and cerebellum remain normal for age. Temporal lobe structures appear stable and normal for age. Vascular: Major intracranial vascular flow voids are stable. Skull and upper cervical spine: Negative. Normal bone marrow signal. Sinuses/Orbits: Stable and negative. Other: Visible  internal auditory structures appear normal. Trace mastoid effusions are stable. Negative scalp soft tissues. IMPRESSION: No acute intracranial abnormality. Continued stable noncontrast MRI appearance of the brain. Electronically Signed   By: Odessa FlemingH  Hall M.D.   On: 03/02/2017 13:42   Dg Chest Port 1 View  Result Date: 03/01/2017 CLINICAL DATA:  Wheezing. EXAM: PORTABLE CHEST 1 VIEW COMPARISON:  Feb 26, 2017 FINDINGS: No pneumothorax. Stable cardiomegaly. Bilateral layering effusions, left greater than right. No overt edema. Opacity underlying the effusions is likely atelectasis. No other infiltrate. IMPRESSION: New bilateral layering effusions, left greater than right. Underlying opacities are likely atelectasis. Stable cardiomegaly. Electronically Signed   By: Gerome Samavid  Williams III M.D   On: 03/01/2017 10:01     Medications:    . acidophilus  1 capsule Oral BID  . amLODipine  10 mg Oral Daily  . atorvastatin  10 mg Oral QHS  . budesonide (PULMICORT) nebulizer solution  0.25 mg Nebulization BID  . carbidopa-levodopa  1 tablet Oral TID  . diclofenac sodium  2 g Topical BID  . donepezil  10 mg Oral Daily  . escitalopram  10 mg Oral Daily  . ferrous sulfate  325 mg Oral BID  . ipratropium-albuterol  3 mL Nebulization Q4H  . levETIRAcetam  250 mg Oral Daily  . mirtazapine  15 mg Oral QHS  . multivitamin-lutein   Oral Daily  . mupirocin cream   Topical Daily  . pantoprazole  40 mg Oral Daily  . predniSONE  50 mg Oral Q breakfast  . QUEtiapine  25 mg Oral QHS  . vitamin B-12  1,000 mcg Oral Daily   acetaminophen **OR** acetaminophen, cyclobenzaprine, ondansetron **OR** ondansetron (ZOFRAN) IV  Assessment/ Plan:  83 y.o.caucsian female with medical problems of Parkinson's disease, dementia,stroke, depression, pulmonary hypertension, who [facility]2017 for evaluation of mental status changes status post fall.    1. Acute renal failure. Unclear cause but likely from  hemodynamic instability  :  No new renal function testing this AM, will order BMP for today.    2. Chronic kidney disease stage IV - baseline creatinine 1.82/GFR 25 Underlying CKD is likely secondary to atherosclerosis -  Recheck kidney function today as above.   3.  Anemia of CKD:  hgb currently 9.8, no urgent indication for procrit, continue to monitor CBC periodically.    LOS: 4 Laurajean Hosek 5/14/20182:37 PM  [facility] [place], [place] [phone]

## 2017-03-02 NOTE — Progress Notes (Signed)
[facility] - [place] at [facility]   PATIENT NAME: Stacey Baker    MR#:  [id number]  DATE OF BIRTH:  [birth year]  SUBJECTIVE:  CHIEF COMPLAINT:   Chief Complaint  Patient presents with  . Urinary Frequency   Patient is 83 year old Caucasian female with past medical history significant for history of CAD stage III, pericardial, pleural effusions, chronic diastolic CHF, seizures, Parkinson's disease, memory loss, aphasia, who presents to the hospital with confusion. She was noted to have worsening renal failure with creatinine level of 3.2, up from 1.8 baseline. She was also noted to be anemic, she was noted to have slightly positive Hemoccult of stool. She was hydrated on admission, her creatinine level has improved, however hemoglobin level was found to be low at 7.9, no active bleeding was noted. Patient Has difficulty expressing herself today, more somnolent, unable to get review of systems. ABGs revealed mild acidosis, which was  metabolic. Patient was seen by neurologist, ammonia, TSH,vitamin B12 levels were checked, Normal, no further neurologic interventions were recommended.. Patient feels comfortable today, more alert. Recent chest x-ray revealed worsening edema, pleural effusions, patient was given Lasix intravenously yesterday with improvement of her respiratory status, still remains on 3 L of oxygen through nasal cannula at present. Kidney function has improved. According to care management/social work, patient's mental status has abruptly change since prior, concerning for possible neurologic event. MRI of the brain revealed no acute changes, electroencephalogram is pending.   Review of Systems  Unable to perform ROS: Language  excessive aphasia, expressive aphasia  VITAL SIGNS: Blood pressure (!) 141/57, pulse 80, temperature 98.2 F (36.8 C), temperature source Oral, resp. rate 18, height 5\' 3"  (1.6 m), weight 63.5 kg (140 lb), SpO2 96 %.  PHYSICAL  EXAMINATION:   GENERAL:  83 y.o.-year-old patient lying in the bed with no acute distress, more alert, has difficulty to converse due to expressive aphasia.  EYES: Pupils equal, round, reactive to light and accommodation. No scleral icterus. Extraocular muscles intact.  HEENT: Head atraumatic, normocephalic. Oropharynx and nasopharynx clear. Bruising of her left periorbital area NECK:  Supple, no jugular venous distention. No thyroid enlargement, no tenderness.  LUNGS:  diminished breath sounds bilaterally at bases, especially on the left,left-sided wheezing, few scattered rales,rhonchi , but no crepitations noted due to poor effort. Intermittent use of accessory muscles of respiration.  CARDIOVASCULAR: S1, S2 normal. No murmurs, rubs, or gallops.  ABDOMEN: Soft, nontender, nondistended. Bowel sounds present. No organomegaly or mass.  EXTREMITIES: No pedal edema, cyanosis, or clubbing.  NEUROLOGIC: Cranial nerves II through XII are intact. Muscle strength 5/5 in all extremities. Sensation intact. Gait not checked.  PSYCHIATRIC: The patient is alert,  Unable to assess orientation due to expressive aphasia Skin no obvious lesion, or ulcer.   ORDERS/RESULTS REVIEWED:   CBC  Recent Labs Lab 02/26/17 1807 02/27/17 0529 02/28/17 0349 03/01/17 0305 03/02/17 0419  WBC 6.0 4.9 3.8 4.2 2.8*  HGB 8.1* 7.6* 7.9* 8.4* 9.8*  HCT 24.6* 22.9* 23.7* 25.8* 29.6*  PLT 111* 99* 95* 101* 107*  MCV 88.0 87.6 90.1 90.7 89.4  MCH 28.8 29.0 30.0 29.6 29.5  MCHC 32.8 33.1 33.3 32.6 33.1  RDW 16.5* 16.9* 17.1* 17.6* 16.9*  LYMPHSABS 0.7*  --   --   --   --   MONOABS 0.5  --   --   --   --   EOSABS 0.0  --   --   --   --  BASOSABS 0.1  --   --   --   --    ------------------------------------------------------------------------------------------------------------------  Chemistries   Recent Labs Lab 02/26/17 1807 02/27/17 0529 02/28/17 0349 03/01/17 1432  NA 137 141 145 140  K 5.4* 4.8 4.6 4.5   CL 106 113* 114* 111  CO2 22 23 23 22   GLUCOSE 89 86 66 176*  BUN 34* 33* 32* 30*  CREATININE 3.17* 2.98* 2.99* 2.59*  CALCIUM 8.4* 8.0* 8.1* 8.4*  AST  --   --   --  18  ALT  --   --   --  <5*  ALKPHOS  --   --   --  94  BILITOT  --   --   --  0.5   ------------------------------------------------------------------------------------------------------------------ estimated creatinine clearance is 15 mL/min (A) (by C-G formula based on SCr of 2.59 mg/dL (H)). ------------------------------------------------------------------------------------------------------------------  Recent Labs  03/01/17 0305  TSH 1.914    Cardiac Enzymes  Recent Labs Lab 02/26/17 2359 02/27/17 0529 02/27/17 1107  TROPONINI <0.03 0.03* 0.03*   ------------------------------------------------------------------------------------------------------------------ Invalid input(s): POCBNP ---------------------------------------------------------------------------------------------------------------  RADIOLOGY: Mr Brain Wo Contrast  Result Date: 03/02/2017 CLINICAL DATA:  83 year old female with confusion. Acute on chronic renal failure, anemia. EXAM: MRI HEAD WITHOUT CONTRAST TECHNIQUE: Multiplanar, multiecho pulse sequences of the brain and surrounding structures were obtained without intravenous contrast. COMPARISON:  Head CT without contrast 02/26/2017. Brain MRI 01/31/2017, and earlier. FINDINGS: Brain: Stable cerebral volume. No restricted diffusion to suggest acute infarction. No midline shift, mass effect, evidence of mass lesion, ventriculomegaly, extra-axial collection or acute intracranial hemorrhage. Cervicomedullary junction and pituitary are within normal limits. Stable gray and white matter signal throughout the brain. Scattered nonspecific cerebral white matter T2 and FLAIR hyperintensity. No cortical encephalomalacia arch that no cortical encephalomalacia or chronic cerebral blood products are  identified. Deep gray matter nuclei, brainstem, and cerebellum remain normal for age. Temporal lobe structures appear stable and normal for age. Vascular: Major intracranial vascular flow voids are stable. Skull and upper cervical spine: Negative. Normal bone marrow signal. Sinuses/Orbits: Stable and negative. Other: Visible internal auditory structures appear normal. Trace mastoid effusions are stable. Negative scalp soft tissues. IMPRESSION: No acute intracranial abnormality. Continued stable noncontrast MRI appearance of the brain. Electronically Signed   By: Odessa FlemingH  Hall M.D.   On: 03/02/2017 13:42   Dg Chest Port 1 View  Result Date: 03/01/2017 CLINICAL DATA:  Wheezing. EXAM: PORTABLE CHEST 1 VIEW COMPARISON:  Feb 26, 2017 FINDINGS: No pneumothorax. Stable cardiomegaly. Bilateral layering effusions, left greater than right. No overt edema. Opacity underlying the effusions is likely atelectasis. No other infiltrate. IMPRESSION: New bilateral layering effusions, left greater than right. Underlying opacities are likely atelectasis. Stable cardiomegaly. Electronically Signed   By: Gerome Samavid  Williams III M.D   On: 03/01/2017 10:01    EKG:  Orders placed or performed during the hospital encounter of 02/26/17  . ED EKG  . ED EKG    ASSESSMENT AND PLAN:  Active Problems:   Generalized weakness   Acute on chronic renal failure (HCC)   Anemia   Guaiac positive stools   Fall  #1. Acute on chronic renal failure, Underlying CKD, stage IV, Bladder residual was only 10 cc, continue daily intravenous Lasix, creatinine has improved today, renal ultrasound revealed bilateral renal cysts. Urinalysis was unremarkable, unlikely UTI . CK levels is normal. Follow creatinine in the morning. Appreciate nephrologist input. Discussed with patient's son yesterday. Palliative care consultation is requested.   #2. Elevated troponin, likely demand ischemia,  echocardiogram revealed normal ejection fraction, diastolic  dysfunction, moderate tricuspid regurgitation, pulmonary hypertension, continue Lasix intravenously, follow clinically: oxygenation, weight, urinary output, creatinine #3. Hyperkalemia, resolved with Kayexalate and improvement of kidney function , following closely with Lasix administration #4. Generalized weakness, fall,  physical therapist saw patient in consultation and recommended 24-hour supervision/assistance, speech therapist evaluated patient, recommended dysphagia 3 diet with thin liquids, aspiration precautions , getting palliative care involved  to discuss goals of care with the patient and her son, patient's son was updated yesterday #5. Hemoccult positive stool, anemia, iron studies, ferritin revealed anemia of chronic disease, suspect multifactorial, chronic blood loss anemia and anemia of chronic disease due to renal failure, appreciate gastroenterologist input, no further procedures were recommended, but medical therapy,, continue Protonix , no active bleeding, hemoglobin remains stable #7. Altered mental status, Confusion, etiology is unclear, appreciate neurologist input, TSH, ammonia, B12 levels were normal, no further diagnostics were recommended by Dr. Thad Ranger, MRI of the brain was unremarkable, electroencephalogram is pending. Patient seems to be more alert, however, communication is still significantly impaired due to expressive aphasia, which is new, according to facility staff. #8. Acidosis, metabolic, BMP is unremarkable, bicarbonate level is 22. Today, lactic acid level was normal  Management plans discussed with the patient, family and they are in agreement.   DRUG ALLERGIES:  Allergies  Allergen Reactions  . Penicillins Other (See Comments)    Reaction:  Unknown   . Plavix [Clopidogrel Bisulfate] Other (See Comments)    Reaction:  Unknown     CODE STATUS:     Code Status Orders        Start     Ordered   02/26/17 2326  Do not attempt resuscitation (DNR)   Continuous    Question Answer Comment  In the event of cardiac or respiratory ARREST Do not call a "code blue"   In the event of cardiac or respiratory ARREST Do not perform Intubation, CPR, defibrillation or ACLS   In the event of cardiac or respiratory ARREST Use medication by any route, position, wound care, and other measures to relive pain and suffering. May use oxygen, suction and manual treatment of airway obstruction as needed for comfort.      02/26/17 2325    Code Status History    Date Active Date Inactive Code Status Order ID Comments User Context   07/05/2016  5:47 PM 07/09/2016  7:51 PM Full Code [id number]  Marguarite Arbour, MD ED   07/03/2016 11:04 AM 07/03/2016  5:26 PM Full Code [id number]  Milagros Loll, MD ED   12/31/2015  5:42 PM 01/06/2016  7:12 PM Full Code [id number]  [name], MD Inpatient   09/13/2015 10:55 PM 09/15/2015  8:25 PM Full Code [id number]  Oralia Manis, MD Inpatient   08/28/2015  8:14 AM 08/29/2015  4:49 PM DNR [id number]  Delfino Lovett, MD Inpatient   08/27/2015 11:25 PM 08/28/2015  8:14 AM Full Code [id number]  Hower, Cletis Athens, MD ED    Advance Directive Documentation     Most Recent Value  Type of Advance Directive  Out of facility DNR (pink MOST or yellow form)  Pre-existing out of facility DNR order (yellow form or pink MOST form)  -  "MOST" Form in Place?  -      TOTAL TIME TAKING CARE OF THIS PATIENT 35  minutes.    Katharina Caper M.D on 03/02/2017 at 2:54 PM  Between 7am to 6pm - Pager - [phone]  After 6pm go to  [email or web address] - password [email or web address] [facility]  [facility]  Office  [phone]  CC: Primary care physician; Merlene Pulling, PA-C

## 2017-03-02 NOTE — Progress Notes (Signed)
Initial Nutrition Assessment  DOCUMENTATION CODES:   Not applicable  INTERVENTION:   Ensure Enlive po TID, each supplement provides 350 kcal and 20 grams of protein  NUTRITION DIAGNOSIS:   Inadequate oral intake related to other (see comment), chronic illness (altered mental status ) as evidenced by calorie count.   GOAL:   Patient will meet greater than or equal to 90% of their needs  MONITOR:   PO intake, Supplement acceptance, Labs, Weight trends, Skin  REASON FOR ASSESSMENT:   Consult Calorie Count  ASSESSMENT:   83 year old Caucasian female with past medical history significant for history of CAD stage III, pericardial, pleural effusions, chronic diastolic CHF, seizures, Parkinson's disease, memory loss, aphasia, who presents to the hospital with confusion.   Unable to visualize pt today as pt was gone for MRI at both visits. Calorie Count in progress. Pt eating 50-75% meals. Per chart, pt is weight stable. RD will order Ensure to help pt meet estimated needs.   Medications reviewed and include: acidophilus, ferrous sulfate, remeron, MVI, protonix, prednisone  Labs reviewed: BUN 30(H), creat 2.59(H), Ca 8.4(L) Wbc- 2.8(L), Hgb 9.8(L), Hct 29.6(L)  Unable to complete Nutrition-Focused physical exam at this time.   Diet Order:  DIET DYS 3 Room service appropriate? Yes with Assist; Fluid consistency: Thin  Skin:  Wound (see comment) (wound finger)  Last BM:  5/13  Height:   Ht Readings from Last 1 Encounters:  02/26/17 5\' 3"  (1.6 m)    Weight:   Wt Readings from Last 1 Encounters:  03/02/17 140 lb (63.5 kg)    Ideal Body Weight:  52.3 kg  BMI:  Body mass index is 24.8 kg/m.  Estimated Nutritional Needs:   Kcal:  1400-1600kcal/day   Protein:  70-83g/day   Fluid:  >1.4L/day   EDUCATION NEEDS:   No education needs identified at this time  Betsey Holidayasey Royetta Probus MS, RD, LDN Pager #- [phone]

## 2017-03-02 NOTE — Progress Notes (Addendum)
Calorie Count Day One  Estimated Nutritional Needs:   Kcal:  1400-1600kcal/day   Protein:  70-83g/day   Fluid:  >1.4L/day   Dinner 5/13: 1 banana pudding, 2% milk 8z, 1 sweet tea, 1/2 baked chicken breast, 1/3 whipped potatoes  Total- 553kcal (40% estimated needs), 27g protein (39% estimated needs)   Breakfast 5/14: 1 fruit cocktail, 1 coffee, 4oz apple juice, 1/2 scrambled eggs, 1 sausage patty, 1/2 blueberry muffin  Total- 408kcal (29% estimated needs), 11g protein (16% estimated needs)  Lunch 5/14: Pt away at MRI  Supplements ordered 5/14: Ensure Enlive po BID, each supplement provides 350 kcal and 20 grams of protein  Total- none  Total Intake:  961kcal (69% estimated needs) and 38g protein (54% estimated needs)  Betsey Holidayasey Ariann Khaimov MS, RD, LDN Pager #- [phone]

## 2017-03-02 NOTE — Progress Notes (Signed)
Palliative Medicine consult noted. Due to high referral volume, there may be a delay seeing this patient. Please call the Palliative Medicine Team office at [phone] if recommendations are needed in the interim.  Thank you for inviting us to see this patient.  Margret ChanceMelanie G. Kielan Dreisbach, RN, BSN, [facility] 03/02/2017 4:00 PM Cell [phone] 8:00-4:00 Monday-Friday Office [phone]

## 2017-03-02 NOTE — Progress Notes (Signed)
OT Cancellation Note  Patient Details Name: Stacey ManchesterMiriam Baker MRN: [id number] DOB: 04/29/1932   Cancelled Treatment:    Reason Eval/Treat Not Completed: Patient at procedure or test/ unavailable. Pt out of room. Will re-attempt OT treatment at later date/time as pt is available.  Richrd PrimeJamie Stiller, MPH, MS, OTR/L ascom [phone] 03/02/17, 1:34 PM

## 2017-03-02 NOTE — Progress Notes (Signed)
Speech Therapy Note: reviewed chart notes; consulted NSG re: pt's status today w/ her eating/drinking. NSG denied any s/s of aspiration stating pt ate "well" today but required feeding assistance to do so d/t overall weakness and need for support. Dietician is following pt's status as well. NSG stated pt is doing well w/ oral intake including Pills Crushed in Puree; aspiration precautions being followed.  ST services will be available for any further education as needed. Recommend continue current Dysphagia level 3 w/ thin liquids at discharge w/ aspiration precautions.     Jerilynn SomKatherine Watson, MS, CCC-SLP

## 2017-03-03 DIAGNOSIS — Z7189 Other specified counseling: Secondary | ICD-10-CM

## 2017-03-03 DIAGNOSIS — Z515 Encounter for palliative care: Secondary | ICD-10-CM

## 2017-03-03 DIAGNOSIS — N184 Chronic kidney disease, stage 4 (severe): Secondary | ICD-10-CM

## 2017-03-03 DIAGNOSIS — N170 Acute kidney failure with tubular necrosis: Secondary | ICD-10-CM

## 2017-03-03 LAB — CBC
HCT: 28.1 % — ABNORMAL LOW (ref 35.0–47.0)
Hemoglobin: 9.2 g/dL — ABNORMAL LOW (ref 12.0–16.0)
MCH: 29.4 pg (ref 26.0–34.0)
MCHC: 32.7 g/dL (ref 32.0–36.0)
MCV: 89.7 fL (ref 80.0–100.0)
PLATELETS: 118 10*3/uL — AB (ref 150–440)
RBC: 3.13 MIL/uL — ABNORMAL LOW (ref 3.80–5.20)
RDW: 17.1 % — AB (ref 11.5–14.5)
WBC: 5.8 10*3/uL (ref 3.6–11.0)

## 2017-03-03 LAB — GLUCOSE, CAPILLARY: Glucose-Capillary: 109 mg/dL — ABNORMAL HIGH (ref 65–99)

## 2017-03-03 LAB — CREATININE, SERUM
Creatinine, Ser: 2.64 mg/dL — ABNORMAL HIGH (ref 0.44–1.00)
GFR, EST AFRICAN AMERICAN: 18 mL/min — AB (ref >60)
GFR, EST NON AFRICAN AMERICAN: 16 mL/min — AB (ref >60)

## 2017-03-03 MED ORDER — PREDNISONE 20 MG PO TABS
10.0000 mg | ORAL_TABLET | Freq: Every day | ORAL | Status: AC
  Administered 2017-03-07: 10 mg via ORAL
  Filled 2017-03-03: qty 1

## 2017-03-03 MED ORDER — PREDNISONE 20 MG PO TABS
20.0000 mg | ORAL_TABLET | Freq: Every day | ORAL | Status: AC
  Administered 2017-03-06: 20 mg via ORAL
  Filled 2017-03-03: qty 1

## 2017-03-03 MED ORDER — PREDNISONE 20 MG PO TABS
40.0000 mg | ORAL_TABLET | Freq: Every day | ORAL | Status: AC
  Administered 2017-03-04: 40 mg via ORAL
  Filled 2017-03-03: qty 2

## 2017-03-03 MED ORDER — ENSURE ENLIVE PO LIQD
237.0000 mL | Freq: Two times a day (BID) | ORAL | Status: DC
  Administered 2017-03-04 – 2017-03-09 (×7): 237 mL via ORAL

## 2017-03-03 MED ORDER — IPRATROPIUM-ALBUTEROL 0.5-2.5 (3) MG/3ML IN SOLN
3.0000 mL | Freq: Four times a day (QID) | RESPIRATORY_TRACT | Status: DC | PRN
  Filled 2017-03-03: qty 3

## 2017-03-03 MED ORDER — PREDNISONE 20 MG PO TABS
30.0000 mg | ORAL_TABLET | Freq: Every day | ORAL | Status: AC
  Administered 2017-03-05: 30 mg via ORAL
  Filled 2017-03-03: qty 2

## 2017-03-03 MED ORDER — DEXTROSE-NACL 5-0.9 % IV SOLN
INTRAVENOUS | Status: DC
  Administered 2017-03-03 – 2017-03-07 (×6): via INTRAVENOUS

## 2017-03-03 NOTE — Progress Notes (Signed)
Patient is waking up and eating her lunch at this time.  Palliative doctor is talking to the family in the family room

## 2017-03-03 NOTE — Progress Notes (Signed)
[facility], [place] 03/03/17  Subjective:  Renal function slightly worse today. Creatinine up to 2.64. Patient a bit more difficult to arouse today.   Objective:  Vital signs in last 24 hours:  Temp:  [97.7 F (36.5 C)-98.6 F (37 C)] 97.7 F (36.5 C) (05/15 0532) Pulse Rate:  [56-82] 56 (05/15 1047) Resp:  [17-21] 17 (05/15 0532) BP: (96-142)/(53-80) 136/53 (05/15 1047) SpO2:  [95 %-99 %] 99 % (05/15 0811) FiO2 (%):  [32 %] 32 % (05/15 0811)  Weight change:  Filed Weights   02/28/17 0536 03/01/17 0450 03/02/17 0500  Weight: 62.6 kg (138 lb 1.6 oz) 62 kg (136 lb 11.2 oz) 63.5 kg (140 lb)    Intake/Output:    Intake/Output Summary (Last 24 hours) at 03/03/17 1141 Last data filed at 03/03/17 0900  Gross per 24 hour  Intake              180 ml  Output                0 ml  Net              180 ml     Physical Exam: General: NAD, laying in bed  HEENT Left periorbital ecchymosis, OM moist  Neck supple  Pulm/lungs Normal effort, CTAB  CVS/Heart S1S2 no rubs  Abdomen:  Soft, NTND  Extremities: No edema  Neurologic: Lethargic but arousable, not following commands             Basic Metabolic Panel:   Recent Labs Lab 02/26/17 1807 02/27/17 0529 02/28/17 0349 03/01/17 1432 03/02/17 1515 03/03/17 0421  NA 137 141 145 140 139  --   K 5.4* 4.8 4.6 4.5 5.1  --   CL 106 113* 114* 111 107  --   CO2 22 23 23 22 22   --   GLUCOSE 89 86 66 176* 149*  --   BUN 34* 33* 32* 30* 30*  --   CREATININE 3.17* 2.98* 2.99* 2.59* 2.45* 2.64*  CALCIUM 8.4* 8.0* 8.1* 8.4* 8.7*  --      CBC:  Recent Labs Lab 02/26/17 1807 02/27/17 0529 02/28/17 0349 03/01/17 0305 03/02/17 0419 03/03/17 0421  WBC 6.0 4.9 3.8 4.2 2.8* 5.8  NEUTROABS 4.6  --   --   --   --   --   HGB 8.1* 7.6* 7.9* 8.4* 9.8* 9.2*  HCT 24.6* 22.9* 23.7* 25.8* 29.6* 28.1*  MCV 88.0 87.6 90.1 90.7 89.4 89.7  PLT 111* 99* 95* 101* 107* 118*     No results found for:  HEPBSAG, HEPBSAB, HEPBIGM    Microbiology:  Recent Results (from the past 240 hour(s))  MRSA PCR Screening     Status: None   Collection Time: 03/01/17  5:05 PM  Result Value Ref Range Status   MRSA by PCR NEGATIVE NEGATIVE Final    Comment:        The GeneXpert MRSA Assay (FDA approved for NASAL specimens only), is one component of a comprehensive MRSA colonization surveillance program. It is not intended to diagnose MRSA infection nor to guide or monitor treatment for MRSA infections.     Coagulation Studies: No results for input(s): LABPROT, INR in the last 72 hours.  Urinalysis: No results for input(s): COLORURINE, LABSPEC, PHURINE, GLUCOSEU, HGBUR, BILIRUBINUR, KETONESUR, PROTEINUR, UROBILINOGEN, NITRITE, LEUKOCYTESUR in the last 72 hours.  Invalid input(s): APPERANCEUR    Imaging: Mr Brain Wo Contrast  Result Date: 03/02/2017 CLINICAL DATA:  83 year old  female with confusion. Acute on chronic renal failure, anemia. EXAM: MRI HEAD WITHOUT CONTRAST TECHNIQUE: Multiplanar, multiecho pulse sequences of the brain and surrounding structures were obtained without intravenous contrast. COMPARISON:  Head CT without contrast 02/26/2017. Brain MRI 01/31/2017, and earlier. FINDINGS: Brain: Stable cerebral volume. No restricted diffusion to suggest acute infarction. No midline shift, mass effect, evidence of mass lesion, ventriculomegaly, extra-axial collection or acute intracranial hemorrhage. Cervicomedullary junction and pituitary are within normal limits. Stable gray and white matter signal throughout the brain. Scattered nonspecific cerebral white matter T2 and FLAIR hyperintensity. No cortical encephalomalacia arch that no cortical encephalomalacia or chronic cerebral blood products are identified. Deep gray matter nuclei, brainstem, and cerebellum remain normal for age. Temporal lobe structures appear stable and normal for age. Vascular: Major intracranial vascular flow voids are  stable. Skull and upper cervical spine: Negative. Normal bone marrow signal. Sinuses/Orbits: Stable and negative. Other: Visible internal auditory structures appear normal. Trace mastoid effusions are stable. Negative scalp soft tissues. IMPRESSION: No acute intracranial abnormality. Continued stable noncontrast MRI appearance of the brain. Electronically Signed   By: Odessa FlemingH  Hall M.D.   On: 03/02/2017 13:42     Medications:    . acidophilus  1 capsule Oral BID  . amLODipine  10 mg Oral Daily  . atorvastatin  10 mg Oral QHS  . budesonide (PULMICORT) nebulizer solution  0.25 mg Nebulization BID  . carbidopa-levodopa  1 tablet Oral TID  . diclofenac sodium  2 g Topical BID  . donepezil  10 mg Oral Daily  . escitalopram  10 mg Oral Daily  . feeding supplement (ENSURE ENLIVE)  237 mL Oral TID BM  . ferrous sulfate  325 mg Oral BID  . levETIRAcetam  250 mg Oral Daily  . mirtazapine  15 mg Oral QHS  . multivitamin-lutein   Oral Daily  . mupirocin cream   Topical Daily  . pantoprazole  40 mg Oral Daily  . QUEtiapine  25 mg Oral QHS  . vitamin B-12  1,000 mcg Oral Daily   acetaminophen **OR** acetaminophen, cyclobenzaprine, ipratropium-albuterol, ondansetron **OR** ondansetron (ZOFRAN) IV  Assessment/ Plan:  83 y.o.caucsian female with medical problems of Parkinson's disease, dementia,stroke, depression, pulmonary hypertension, who was [facility]26/2017 for evaluation of mental status changes status post fall.    1. Acute renal failure. Unclear cause but likely from hemodynamic instability  : Creatinine slightly worse today at 2.64. Patient does not appear to have very much by mouth intake. We will start the patient on D5 normal saline at 50 cc per hour.  2. Chronic kidney disease stage IV - baseline creatinine 1.82/GFR 25 Underlying CKD is likely secondary to atherosclerosis -  Creatinine remains above baseline at the moment.  3.  Anemia of CKD:  Hemoglobin 9.2 at the  moment. Hold off on Epogen for now. Patient also thrombocytopenic and defer workup of this to hospitalist.   LOS: 5 Jakaylee Sasaki, [facility]UN[facility] 5/15/201811:41 AM  [facility] [place], [place] [phone]

## 2017-03-03 NOTE — Progress Notes (Signed)
Calorie Count Note  48 hour calorie count ordered. Day 2 results below:  Diet: Dysphagia 3 with thin liquids Supplements: Ensure Enlive po TID, each supplement provides 350 kcal and 20 grams of protein  Dinner 5/14: 494 kcal, 37 grams of protein (100% chicken and 2% milk, 75% macaroni & cheese and iced tea, 50% green beans, 20% brownie) Breakfast 5/15: N/A (patient too lethargic/drowsy this AM to eat breakfast) Lunch 5/15: 844 kcal, 45 grams of protein (100% chicken, whipped potatoes, gravy, Magic Cup, chocolate ice cream, sweet tea; 50% of 2% milk) Supplements: 612.5 kcal, 35 grams of protein (1 bottle Ensure Enlive last night, 75% of one bottle so far today -pt still drinking)  Total intake: 1950.5 kcal (139% of minimum estimated needs)  117 grams of protein (167% of minimum estimated needs)  Estimated Nutritional Needs:  Kcal:  1400-1600kcal/day  Protein:  70-83g/day  Fluid:  >1.4L/day   Nutrition Dx: Inadequate oral intake related to other (see comment), chronic illness (altered mental status ) as evidenced by calorie count.   Goal: Patient will meet greater than or equal to 90% of their needs  Intervention: -Decrease to Ensure Enlive po BID, each supplement provides 350 kcal and 20 grams of protein. -Will discontinue calorie count as 48 hours complete. Discussed with RN.  Helane RimaLeanne Latonya Nelon, MS, RD, LDN Pager: [phone] After Hours Pager: [phone]

## 2017-03-03 NOTE — Progress Notes (Signed)
Patient is very drowsy and it is difficult to get her to stay awake to taker her meds.  Dr Winona LegatoVaickute gave approval for patient not to take aricept and lexapro this am

## 2017-03-03 NOTE — Progress Notes (Signed)
PT Cancellation Note  Patient Details Name: Stacey ManchesterMiriam Baker MRN: [id number] DOB: 01/16/1932   Cancelled Treatment:    Reason Eval/Treat Not Completed: Patient's level of consciousness   Attempted therapy session this am.  Primary nurse in with pt reporting lethargy and she was attempting to awaken pt for medication.  Pt too lethargic to actively participate in session this morning.  Will continue as appropriate.   Danielle DessSarah Fahd Galea 03/03/2017, 10:56 AM

## 2017-03-03 NOTE — Progress Notes (Signed)
[facility] - [place] at [facility]   PATIENT NAME: Stacey Baker    MR#:  [id number]  DATE OF BIRTH:  12-Jan-1932  SUBJECTIVE:  CHIEF COMPLAINT:   Chief Complaint  Patient presents with  . Urinary Frequency   Patient is 83 year old Caucasian female with past medical history significant for history of CAD stage III, pericardial, pleural effusions, chronic diastolic CHF, seizures, Parkinson's disease, memory loss, aphasia, who presents to the hospital with confusion. She was noted to have worsening renal failure with creatinine level of 3.2, up from 1.8 baseline. She was also noted to be anemic, she was noted to have slightly positive Hemoccult of stool. She was hydrated on admission, her creatinine level has improved, however hemoglobin level was found to be low at 7.9, no active bleeding was noted. Patient Has difficulty expressing herself today, more somnolent, unable to get review of systems. ABGs revealed mild acidosis, which was  metabolic. Patient was seen by neurologist, ammonia, TSH,vitamin B12 levels were checked, Normal, no further neurologic interventions were recommended.. Patient feels comfortable today, more alert. Recent chest x-ray revealed worsening edema, pleural effusions, patient was given Lasix intravenously yesterday with improvement of her respiratory status, still remains on 3 L of oxygen through nasal cannula at present. Kidney function has improved. According to care management/social work, patient's mental status has abruptly change since prior, concerning for possible neurologic event. MRI of the brain revealed no acute changes, electroencephalogram is pending.   Review of Systems  Unable to perform ROS: Language  excessive aphasia, expressive aphasia  VITAL SIGNS: Blood pressure (!) 134/55, pulse (!) 59, temperature 98 F (36.7 C), temperature source Oral, resp. rate 17, height 5\' 3"  (1.6 m), weight 63.5 kg (140 lb), SpO2 99  %.  PHYSICAL EXAMINATION:   GENERAL:  83 y.o.-year-old patient lying in the bed with no acute distress, alert, somewhat restless, has difficulty to converse due to expressive aphasia.  EYES: Pupils equal, round, reactive to light and accommodation. No scleral icterus. Extraocular muscles intact.  HEENT: Head atraumatic, normocephalic. Oropharynx and nasopharynx clear. Bruising of her left periorbital area NECK:  Supple, no jugular venous distention. No thyroid enlargement, no tenderness.  LUNGS:  diminished breath sounds bilaterally at bases, especially on the left,left-sided wheezing, few scattered rales,rhonchi , but no crepitations noted due to poor effort. Intermittent use of accessory muscles of respiration.  CARDIOVASCULAR: S1, S2 normal. No murmurs, rubs, or gallops.  ABDOMEN: Soft, nontender, nondistended. Bowel sounds present. No organomegaly or mass.  EXTREMITIES: No pedal edema, cyanosis, or clubbing.  NEUROLOGIC: Cranial nerves II through XII are intact. Muscle strength 5/5 in all extremities. Sensation intact. Gait not checked.  PSYCHIATRIC: The patient is alert,  Unable to assess orientation due to expressive aphasia Skin no obvious lesion, or ulcer.   ORDERS/RESULTS REVIEWED:   CBC  Recent Labs Lab 02/26/17 1807 02/27/17 0529 02/28/17 0349 03/01/17 0305 03/02/17 0419 03/03/17 0421  WBC 6.0 4.9 3.8 4.2 2.8* 5.8  HGB 8.1* 7.6* 7.9* 8.4* 9.8* 9.2*  HCT 24.6* 22.9* 23.7* 25.8* 29.6* 28.1*  PLT 111* 99* 95* 101* 107* 118*  MCV 88.0 87.6 90.1 90.7 89.4 89.7  MCH 28.8 29.0 30.0 29.6 29.5 29.4  MCHC 32.8 33.1 33.3 32.6 33.1 32.7  RDW 16.5* 16.9* 17.1* 17.6* 16.9* 17.1*  LYMPHSABS 0.7*  --   --   --   --   --   MONOABS 0.5  --   --   --   --   --  EOSABS 0.0  --   --   --   --   --   BASOSABS 0.1  --   --   --   --   --    ------------------------------------------------------------------------------------------------------------------  Chemistries   Recent  Labs Lab 02/26/17 1807 02/27/17 0529 02/28/17 0349 03/01/17 1432 03/02/17 1515 03/03/17 0421  NA 137 141 145 140 139  --   K 5.4* 4.8 4.6 4.5 5.1  --   CL 106 113* 114* 111 107  --   CO2 22 23 23 22 22   --   GLUCOSE 89 86 66 176* 149*  --   BUN 34* 33* 32* 30* 30*  --   CREATININE 3.17* 2.98* 2.99* 2.59* 2.45* 2.64*  CALCIUM 8.4* 8.0* 8.1* 8.4* 8.7*  --   AST  --   --   --  18  --   --   ALT  --   --   --  <5*  --   --   ALKPHOS  --   --   --  94  --   --   BILITOT  --   --   --  0.5  --   --    ------------------------------------------------------------------------------------------------------------------ estimated creatinine clearance is 14.7 mL/min (A) (by C-G formula based on SCr of 2.64 mg/dL (H)). ------------------------------------------------------------------------------------------------------------------  Recent Labs  03/01/17 0305  TSH 1.914    Cardiac Enzymes  Recent Labs Lab 02/26/17 2359 02/27/17 0529 02/27/17 1107  TROPONINI <0.03 0.03* 0.03*   ------------------------------------------------------------------------------------------------------------------ Invalid input(s): POCBNP ---------------------------------------------------------------------------------------------------------------  RADIOLOGY: Mr Brain Wo Contrast  Result Date: 03/02/2017 CLINICAL DATA:  83 year old female with confusion. Acute on chronic renal failure, anemia. EXAM: MRI HEAD WITHOUT CONTRAST TECHNIQUE: Multiplanar, multiecho pulse sequences of the brain and surrounding structures were obtained without intravenous contrast. COMPARISON:  Head CT without contrast 02/26/2017. Brain MRI 01/31/2017, and earlier. FINDINGS: Brain: Stable cerebral volume. No restricted diffusion to suggest acute infarction. No midline shift, mass effect, evidence of mass lesion, ventriculomegaly, extra-axial collection or acute intracranial hemorrhage. Cervicomedullary junction and pituitary are  within normal limits. Stable gray and white matter signal throughout the brain. Scattered nonspecific cerebral white matter T2 and FLAIR hyperintensity. No cortical encephalomalacia arch that no cortical encephalomalacia or chronic cerebral blood products are identified. Deep gray matter nuclei, brainstem, and cerebellum remain normal for age. Temporal lobe structures appear stable and normal for age. Vascular: Major intracranial vascular flow voids are stable. Skull and upper cervical spine: Negative. Normal bone marrow signal. Sinuses/Orbits: Stable and negative. Other: Visible internal auditory structures appear normal. Trace mastoid effusions are stable. Negative scalp soft tissues. IMPRESSION: No acute intracranial abnormality. Continued stable noncontrast MRI appearance of the brain. Electronically Signed   By: Odessa FlemingH  Hall M.D.   On: 03/02/2017 13:42    EKG:  Orders placed or performed during the hospital encounter of 02/26/17  . ED EKG  . ED EKG    ASSESSMENT AND PLAN:  Active Problems:   Generalized weakness   Acute on chronic renal failure (HCC)   Anemia   Guaiac positive stools   Fall   Palliative care encounter   Goals of care, counseling/discussion  #1. Acute on chronic renal failure, Underlying CKD, stage IV, Bladder residual was only 10 cc, Was given 2 doses of intravenous Lasix over the past 2 days, creatinine has stabilized, nephrologist is initiating IV fluids,, renal ultrasound revealed bilateral renal cysts. Urinalysis was unremarkable, unlikely UTI . CK levels is normal. Follow creatinine in the morning. .Marland Kitchen  Palliative care consultation is pending.   #2. Elevated troponin, likely demand ischemia, echocardiogram revealed normal ejection fraction, diastolic dysfunction, moderate tricuspid regurgitation, pulmonary hypertension, continue Lasix intravenously, follow clinically: oxygenation, weight, urinary output, creatinine #3. Hyperkalemia, resolved with Kayexalate and improvement of  kidney function , following closely with Lasix administration #4. Generalized weakness, fall,  physical therapist saw patient in consultation and recommended 24-hour supervision/assistance, speech therapist evaluated patient, recommended dysphagia 3 diet with thin liquids, aspiration precautions , getting palliative care involved  to discuss goals of care with the patient and her son, patient's son was updated day before yesterday #5. Hemoccult positive stool, anemia, iron studies, ferritin revealed anemia of chronic disease, suspect multifactorial, chronic blood loss anemia and anemia of chronic disease due to renal failure, appreciate gastroenterologist input, no further procedures were recommended, but medical therapy,, continue Protonix , no active bleeding, hemoglobin remains stable #7. Altered mental status, Confusion, etiology is unclear, appreciate neurologist input, TSH, ammonia, B12 levels were normal, no further diagnostics were recommended by Dr. Thad Ranger, MRI of the brain was unremarkable, electroencephalogram was normal. Is possible, however, the patient's altered mental status could be related to her medications, may need to hold some of her medications such as Flexeril, Remeron, Seroquel #8. Acidosis, metabolic, BMP is unremarkable, bicarbonate level is 22. today, lactic acid level was normal, no further interventions  Management plans discussed with the patient, family and they are in agreement.   DRUG ALLERGIES:  Allergies  Allergen Reactions  . Penicillins Other (See Comments)    Reaction:  Unknown   . Plavix [Clopidogrel Bisulfate] Other (See Comments)    Reaction:  Unknown     CODE STATUS:     Code Status Orders        Start     Ordered   02/26/17 2326  Do not attempt resuscitation (DNR)  Continuous    Question Answer Comment  In the event of cardiac or respiratory ARREST Do not call a "code blue"   In the event of cardiac or respiratory ARREST Do not perform  Intubation, CPR, defibrillation or ACLS   In the event of cardiac or respiratory ARREST Use medication by any route, position, wound care, and other measures to relive pain and suffering. May use oxygen, suction and manual treatment of airway obstruction as needed for comfort.      02/26/17 2325    Code Status History    Date Active Date Inactive Code Status Order ID Comments User Context   07/05/2016  5:47 PM 07/09/2016  7:51 PM Full Code [id number]  Marguarite Arbour, MD ED   07/03/2016 11:04 AM 07/03/2016  5:26 PM Full Code [id number]  Milagros Loll, MD ED   12/31/2015  5:42 PM 01/06/2016  7:12 PM Full Code [id number]  [name], MD Inpatient   09/13/2015 10:55 PM 09/15/2015  8:25 PM Full Code [id number]  Oralia Manis, MD Inpatient   08/28/2015  8:14 AM 08/29/2015  4:49 PM DNR [id number]  Delfino Lovett, MD Inpatient   08/27/2015 11:25 PM 08/28/2015  8:14 AM Full Code [id number]  Hower, Cletis Athens, MD ED    Advance Directive Documentation     Most Recent Value  Type of Advance Directive  Out of facility DNR (pink MOST or yellow form)  Pre-existing out of facility DNR order (yellow form or pink MOST form)  -  "MOST" Form in Place?  -      TOTAL TIME TAKING CARE OF THIS PATIENT 30  minutes.  Katharina Caper M.D on 03/03/2017 at 3:48 PM  Between 7am to 6pm - Pager - [phone]  After 6pm go to [email or web address] - password [email or web address] [facility]  [facility] [place] [facility]  Office  [phone]  CC: Primary care physician; Merlene Pulling, PA-C

## 2017-03-03 NOTE — Consult Note (Signed)
Consultation Note Date: 03/03/2017   Patient Name: Stacey Baker  DOB: 1932-01-09  MRN: [id number]  Age / Sex: 83 y.o., female  PCP: Alphonse Guild Olean Ree, PA-C Referring Physician: Theodoro Grist, MD  Reason for Consultation: Establishing goals of care  HPI/Patient Profile: 82 y.o. female  with past medical history of CVA with residual apraxia, parkinsons, dementia, CKD 3, CHF-D, and pleural effusion who was admitted on 02/26/2017 with a fall, acute renal failure as well as a drop in Hgb and heme + stool.   Clinical Assessment and Goals of Care:  I have reviewed medical records including EPIC notes, labs and imaging, received report from bedside RN, assessed the patient and then met at the bedside along with Juanda Crumble her son and Morey Hummingbird her dtr in law to discuss diagnosis prognosis, [place], EOL wishes, disposition and options.  I introduced Palliative Medicine as specialized medical care for people living with serious illness. It focuses on providing relief from the symptoms and stress of a serious illness. The goal is to improve quality of life for both the patient and the family.  We discussed a brief life review of the patient. She was widowed 11 years ago.  She has been living in [place] for the past 3 years and is comfortable there.  She is not eating well, she is not mobile, and Juanda Crumble questions how strong her will to live may be.   Natural disease trajectory and expectations at EOL were discussed.  We discussed her decreased renal function.  Her son feels that she would never want HD.  We also discussed her G1DD and pulmonary hypertension that will contribute to difficulty breathing.  I attempted to elicit values and goals of care important to the patient and her son.  The difference between aggressive medical intervention and comfort care was considered in light of the patient's goals of care. Juanda Crumble was  clear that he does not want his mother to suffer.  She is demented.  He would rather she not be upset by the thought of dying so he does not discuss her poor heath with her.  He wants her well cared for and comfortable.    Advanced directives, concepts specific to code status, artifical feeding and hydration, and rehospitalization were considered and discussed.  A MOST form was completed.  Hospice and Palliative Care services outpatient were offered and accepted.  Juanda Crumble would like for his mother to receive hosp[facility] services at [facility].  Questions and concerns were addressed.  The family was encouraged to call with questions or concerns.  PMT will continue to support holistically.    Primary Decision Maker:  NEXT OF KIN, son Juanda Crumble    SUMMARY OF RECOMMENDATIONS    When medically optimized, please return to [facility] with hospice services.  MOST Form:     DNR  Comfort Measures (clean, warm, dry, comfortable - return to hospital only if unable to be comfortable at [facility])  +antibiotics  +IVF for a trial period  No Feeding tube     Code  Status/Advance Care Planning:  DNR    Symptom Management:   Per primary team.  No current unmanaged symptoms.  Additional Recommendations (Limitations, Scope, Preferences):  No Artificial Feeding, No Hemodialysis and No Surgical Procedures  Prognosis:   < 6 months potentially much less given stage 4/5 kidney disease, recurrent UTIs, immobility, Hx of multiple CVAs and parkinsons.  Discharge Planning: [place] with Hospice      Primary Diagnoses: Present on Admission: **None**   I have reviewed the medical record, interviewed the patient and family, and examined the patient. The following aspects are pertinent.  Past Medical History:  Diagnosis Date  . Apraxia following cerebral infarction   . CKD (chronic kidney disease), stage III    a. With acute worsening/AKI noted on several admissions.  .  Dementia   . Dementia without behavioral disturbance   . Depression   . Encephalopathy   . Hyperkalemia    a. 08/2015  . Hyperlipidemia   . Hyperlipidemia   . Hypertension   . Iron deficiency anemia   . Major depressive disorder   . Muscle weakness   . Parkinson disease ([place])   . Parkinson's disease ([place])   . Pericardial effusion    a. 06/2016 Echo: Ef 60-65%, no rwma, Gr1 DD, mild MR, mildly dil LA, PASP 26mHg, mod circumferential pericardial effusion - no hemodynamic compromise.  . Sinus bradycardia    a. 08/2015 in setting of beta blocker and hyperkalemia.  . [place]roke ([place]   . TIA (transient ischemic attack)    a. 08/2015 - essentially neg w/u for stroke.  .Marland KitchenUTI (urinary tract infection)    Social History   Social History  . Marital status: Widowed    Spouse name: N/A  . Number of children: N/A  . Years of education: N/A   Social History Main Topics  . Smoking status: Never Smoker  . Smokeless tobacco: Never Used  . Alcohol use No  . Drug use: No  . Sexual activity: Not Asked   Other Topics Concern  . None   Social History Narrative  . None   Family History  Problem Relation Age of Onset  . Heart disease Mother   . Hypertension Other    Scheduled Meds: . acidophilus  1 capsule Oral BID  . amLODipine  10 mg Oral Daily  . atorvastatin  10 mg Oral QHS  . budesonide (PULMICORT) nebulizer solution  0.25 mg Nebulization BID  . carbidopa-levodopa  1 tablet Oral TID  . diclofenac sodium  2 g Topical BID  . donepezil  10 mg Oral Daily  . escitalopram  10 mg Oral Daily  . feeding supplement (ENSURE ENLIVE)  237 mL Oral TID BM  . ferrous sulfate  325 mg Oral BID  . levETIRAcetam  250 mg Oral Daily  . mirtazapine  15 mg Oral QHS  . multivitamin-lutein   Oral Daily  . mupirocin cream   Topical Daily  . pantoprazole  40 mg Oral Daily  . predniSONE  50 mg Oral Q breakfast  . QUEtiapine  25 mg Oral QHS  . vitamin B-12  1,000 mcg Oral Daily   Continuous  Infusions: PRN Meds:.acetaminophen **OR** acetaminophen, cyclobenzaprine, ipratropium-albuterol, ondansetron **OR** ondansetron (ZOFRAN) IV Allergies  Allergen Reactions  . Penicillins Other (See Comments)    Reaction:  Unknown   . Plavix [Clopidogrel Bisulfate] Other (See Comments)    Reaction:  Unknown    Review of Systems patient does not wake to voice or light touch.  Physical Exam  Elderly frail female, sleeping, with irregular apneic breathing - does not wake to voice or light touch. CV irreg irregular Resp irreg, apneic Adbomen thin Ext no edema.  Vital Signs: BP 96/72 (BP Location: Right Arm)   Pulse 65   Temp 97.7 F (36.5 C) (Oral)   Resp 17   Ht 5' 3"  (1.6 m)   Wt 63.5 kg (140 lb)   SpO2 99%   BMI 24.80 kg/m  Pain Assessment: No/denies pain   Pain Score: 0-No pain   SpO2: SpO2: 99 % O2 Device:SpO2: 99 % O2 Flow Rate: .O2 Flow Rate (L/min): 3 L/min  IO: Intake/output summary:  Intake/Output Summary (Last 24 hours) at 03/03/17 0957 Last data filed at 03/03/17 0540  Gross per 24 hour  Intake              180 ml  Output                0 ml  Net              180 ml    LBM: Last BM Date: 03/02/17 Baseline Weight: Weight: 63.5 kg (140 lb) Most recent weight: Weight: 63.5 kg (140 lb)     Palliative Assessment/Data:   Flowsheet Rows     Most Recent Value  Intake Tab  Referral Department  Hospitalist  Unit at Time of Referral  Med/Surg Unit  Palliative Care Primary Diagnosis  Neurology  Date Notified  02/28/17  Palliative Care Type  New Palliative care  Reason for referral  Clarify Goals of Care  Date of Admission  02/26/17  # of days IP prior to Palliative referral  2  Clinical Assessment  Palliative Performance Scale Score  30%  Psychosocial & Spiritual Assessment  Palliative Care Outcomes  Patient/Family meeting held?  Yes  Who was at the meeting?  patient and son      Time In: 1:00 Time Out: 2:30 Time Total: 90 min. Greater than 50%  of  this time was spent counseling and coordinating care related to the above assessment and plan.  Signed by: Imogene Burn, PA-C Palliative Medicine Pager: [phone]  Please contact Palliative Medicine Team phone at [phone] for questions and concerns.  For individual provider: See Shea Evans

## 2017-03-03 NOTE — Clinical Social Work Note (Signed)
CSW received consult from Palliative requesting hospice be arranged at [facility]. CSW met with patient's son and his wife and confirmed that this would be the ideal plan. CSW informed patient's son that it was not clear yet if Stacey Baker would be able to take her with hospice. He verbalized understanding. CSW has left a message for Lattie Haw at [place] to inquire if they can manage patient. Shela Leff MSW,LCSW [phone]

## 2017-03-04 DIAGNOSIS — W19XXXA Unspecified fall, initial encounter: Secondary | ICD-10-CM

## 2017-03-04 LAB — BASIC METABOLIC PANEL
ANION GAP: 5 (ref 5–15)
BUN: 56 mg/dL — ABNORMAL HIGH (ref 6–20)
CALCIUM: 8.2 mg/dL — AB (ref 8.9–10.3)
CHLORIDE: 109 mmol/L (ref 101–111)
CO2: 27 mmol/L (ref 22–32)
Creatinine, Ser: 2.75 mg/dL — ABNORMAL HIGH (ref 0.44–1.00)
GFR calc non Af Amer: 15 mL/min — ABNORMAL LOW (ref >60)
GFR, EST AFRICAN AMERICAN: 17 mL/min — AB (ref >60)
Glucose, Bld: 123 mg/dL — ABNORMAL HIGH (ref 65–99)
POTASSIUM: 5.8 mmol/L — AB (ref 3.5–5.1)
Sodium: 141 mmol/L (ref 135–145)

## 2017-03-04 LAB — GLUCOSE, CAPILLARY: Glucose-Capillary: 108 mg/dL — ABNORMAL HIGH (ref 65–99)

## 2017-03-04 MED ORDER — QUETIAPINE FUMARATE 25 MG PO TABS
12.5000 mg | ORAL_TABLET | Freq: Every day | ORAL | Status: DC
  Administered 2017-03-04 – 2017-03-08 (×5): 12.5 mg via ORAL
  Filled 2017-03-04 (×5): qty 1

## 2017-03-04 MED ORDER — MIRTAZAPINE 15 MG PO TABS
7.5000 mg | ORAL_TABLET | Freq: Every day | ORAL | Status: DC
  Administered 2017-03-04 – 2017-03-06 (×3): 7.5 mg via ORAL
  Filled 2017-03-04 (×3): qty 1

## 2017-03-04 MED ORDER — SODIUM POLYSTYRENE SULFONATE 15 GM/60ML PO SUSP
30.0000 g | Freq: Once | ORAL | Status: AC
  Administered 2017-03-04: 30 g via ORAL
  Filled 2017-03-04: qty 120

## 2017-03-04 NOTE — Progress Notes (Signed)
Patient sleeping soundly this morning. Dr. Winona LegatoVaickute making changes to patients night medication.

## 2017-03-04 NOTE — Progress Notes (Addendum)
Daily Progress Note   Patient Name: Stacey Baker       Date: 03/04/2017 DOB: 1932/01/27  Age: 83 y.o. MRN#: [id number] Attending Physician: Katharina Caper, MD Primary Care Physician: Merlene Pulling, PA-C Admit Date: 02/26/2017  Reason for Consultation/Follow-up: Establishing goals of care  Subjective: Patient is awake, talking and wants to eat lunch.  I fed her 3/4 serving of peaches, 1/2 roll and then she started slowly feeding her self a magic cup.  She has expressive aphasia (I understand this is baseline) but was able to make her thoughts known.  She tells me she has not been out of bed today and that she is in no discomfort.  I think Mrs. Sandstrom is better in the afternoon than she is in the morning.  She sleeps very heavily in the morning.  My understanding from her son is that at baseline she has periods of significant confusion.  Her creatinine is slowly rising today despite gentle IVF.   Assessment: Patient is critically ill with acute on chronic renal failure that is not responding to treatment.  Her kidney function continues to slowly decline - However her exam is stable if not improving.  She is awake, orientated, responding appropriately and eating.  If her kidney function continues to decline her clinical picture will decline rapidly.   Patient Profile/HPI: 83 y.o. female  with past medical history of CVA with residual apraxia, parkinsons, dementia, CKD 3, CHF-D, and pleural effusion who was admitted on 02/26/2017 with a fall, acute renal failure as well as a drop in Hgb and heme + stool.    Length of Stay: 6  Current Medications: Scheduled Meds:  . acidophilus  1 capsule Oral BID  . amLODipine  10 mg Oral Daily  . atorvastatin  10 mg Oral QHS  . budesonide  (PULMICORT) nebulizer solution  0.25 mg Nebulization BID  . carbidopa-levodopa  1 tablet Oral TID  . diclofenac sodium  2 g Topical BID  . donepezil  10 mg Oral Daily  . escitalopram  10 mg Oral Daily  . feeding supplement (ENSURE ENLIVE)  237 mL Oral BID BM  . ferrous sulfate  325 mg Oral BID  . levETIRAcetam  250 mg Oral Daily  . mirtazapine  15 mg Oral QHS  . multivitamin-lutein   Oral Daily  .  mupirocin cream   Topical Daily  . pantoprazole  40 mg Oral Daily  . [START ON 03/05/2017] predniSONE  30 mg Oral Q breakfast   Followed by  . [START ON 03/06/2017] predniSONE  20 mg Oral Q breakfast   Followed by  . [START ON 03/07/2017] predniSONE  10 mg Oral Q breakfast  . QUEtiapine  25 mg Oral QHS  . vitamin B-12  1,000 mcg Oral Daily    Continuous Infusions: . dextrose 5 % and 0.9% NaCl 50 mL/hr at 03/04/17 0531    PRN Meds: acetaminophen **OR** acetaminophen, cyclobenzaprine, ipratropium-albuterol, ondansetron **OR** ondansetron (ZOFRAN) IV  Physical Exam       Well developed elderly female with expressive aphasia and apraxia, eating lunch CV rrr Resp no distress Abdomen soft, nt   Vital Signs: BP (!) 136/51 (BP Location: Right Arm)   Pulse 62   Temp 98.1 F (36.7 C) (Oral)   Resp 16   Ht 5\' 3"  (1.6 m)   Wt 63.5 kg (140 lb 1.6 oz)   SpO2 97%   BMI 24.82 kg/m  SpO2: SpO2: 97 % O2 Device: O2 Device: Nasal Cannula O2 Flow Rate: O2 Flow Rate (L/min): 2 L/min  Intake/output summary:  Intake/Output Summary (Last 24 hours) at 03/04/17 1350 Last data filed at 03/04/17 1000  Gross per 24 hour  Intake          1783.84 ml  Output                0 ml  Net          1783.84 ml   LBM: Last BM Date: 03/02/17 Baseline Weight: Weight: 63.5 kg (140 lb) Most recent weight: Weight: 63.5 kg (140 lb 1.6 oz)       Palliative Assessment/Data:    Flowsheet Rows     Most Recent Value  Intake Tab  Referral Department  Hospitalist  Unit at Time of Referral  Med/Surg Unit    Palliative Care Primary Diagnosis  Neurology  Date Notified  02/28/17  Palliative Care Type  New Palliative care  Reason for referral  Clarify Goals of Care  Date of Admission  02/26/17  # of days IP prior to Palliative referral  2  Clinical Assessment  Palliative Performance Scale Score  30%  Psychosocial & Spiritual Assessment  Palliative Care Outcomes  Patient/Family meeting held?  Yes  Who was at the meeting?  patient and son      Patient Active Problem List   Diagnosis Date Noted  . Palliative care encounter   . Goals of care, counseling/discussion   . Acute on chronic renal failure (HCC) 02/26/2017  . Anemia 02/26/2017  . Guaiac positive stools 02/26/2017  . Fall 02/26/2017  . Hypoxia   . Atrial tachycardia (HCC)   . Generalized weakness 07/05/2016  . Thrombocytopenia (HCC) 07/05/2016  . UTI (urinary tract infection) 07/05/2016  . Acute respiratory failure (HCC) 07/05/2016  . Pleural effusion 07/05/2016  . CKD (chronic kidney disease), stage III   . Pericardial effusion   . Sinus bradycardia   . Acute renal failure (ARF) (HCC) 12/31/2015  . Chronic renal insufficiency 09/15/2015  . Bradycardia 09/15/2015  . Cellulitis 09/13/2015  . Parkinson disease (HCC) 09/13/2015  . HTN (hypertension) 09/13/2015  . HLD (hyperlipidemia) 09/13/2015  . Depression 09/13/2015  . Dementia 09/13/2015  . Hyperkalemia 09/13/2015  . TIA (transient ischemic attack) 08/27/2015    Palliative Care Plan    Recommendations/Plan:  Would continue to monitor in  the hospital to allow her kidneys to declare themselves.     If she remains as is or improves I would recommend a PT evaluation to see if she is able to pivot  I re-ordered PT consult and asked them to see her in the afternoon instead of morning.  If she declines she will be hospice house eligible.  Goals of Care and Additional Recommendations:  Limitations on Scope of Treatment:  No aggressive/invasive interventions.   Family open to supportive care with IVF and antibiotics as needed.  Code Status:  DNR  Prognosis:   < 6 months secondary to acute on chronic renal failure, dementia, parkinsons, failing nutrition and immobility.   Discharge Planning:  To Be Determined  Hopefully she will be able to [facility] with [facility] support.  Care plan was discussed with [facility] CSW.  Left voice mail for [name] (son)  Thank you for allowing the Palliative Medicine Team to assist in the care of this patient.  Total time spent:  35 min.     Greater than 50%  of this time was spent counseling and coordinating care related to the above assessment and plan.  Algis DownsMarianne York, PA-C Palliative Medicine  Please contact Palliative MedicineTeam phone at [phone] for questions and concerns between 7 am - 7 pm.   Please see AMION for individual provider pager numbers.

## 2017-03-04 NOTE — Progress Notes (Signed)
[facility] - [place] [facility]   PATIENT NAME: Stacey Baker    MR#:  [id number]  DATE OF BIRTH:  06/29/1932  SUBJECTIVE:  CHIEF COMPLAINT:   Chief Complaint  Patient presents with  . Urinary Frequency   Patient is 83 year old Caucasian female with past medical history significant for history of CAD stage III, pericardial, pleural effusions, chronic diastolic CHF, seizures, Parkinson's disease, memory loss, aphasia, who presents to the hospital with confusion. She was noted to have worsening renal failure with creatinine level of 3.2, up from 1.8 baseline. She was also noted to be anemic, she was noted to have slightly positive Hemoccult of stool. She was hydrated on admission, her creatinine level has improved, however hemoglobin level was found to be low at 7.9, no active bleeding was noted. Patient Has difficulty expressing herself today, more somnolent, unable to get review of systems. ABGs revealed mild acidosis, which was  metabolic. Patient was seen by neurologist, ammonia, TSH,vitamin B12 levels were checked, Normal, no further neurologic interventions were recommended.. Patient feels comfortable today, more alert. Recent chest x-ray revealed worsening edema, pleural effusions, patient was given Lasix intravenously yesterday with improvement of her respiratory status, still remains on 3 L of oxygen through nasal cannula at present. Kidney function has improved. According to care management/social work, patient's mental status has abruptly changed since prior, concerning for possible neurologic event. MRI of the brain revealed no acute changes, electroencephalogram Was normal. Patient remains somnolent in the morning, however. Oral intake is relatively satisfactory. Kidney function is fluctuating, was felt to be due to hemodynamic instability.   Review of Systems  Unable to perform ROS: Language  excessive aphasia, expressive aphasia  VITAL SIGNS: Blood  pressure (!) 136/51, pulse 62, temperature 98.1 F (36.7 C), temperature source Oral, resp. rate 16, height 5\' 3"  (1.6 m), weight 63.5 kg (140 lb 1.6 oz), SpO2 97 %.  PHYSICAL EXAMINATION:   GENERAL:  83 y.o.-year-old patient lying in the bed with no acute distress, alert, somewhat restless, has difficulty to converse due to expressive aphasia.  EYES: Pupils equal, round, reactive to light and accommodation. No scleral icterus. Extraocular muscles intact.  HEENT: Head atraumatic, normocephalic. Oropharynx and nasopharynx clear. Bruising of her left periorbital area NECK:  Supple, no jugular venous distention. No thyroid enlargement, no tenderness.  LUNGS:  diminished breath sounds bilaterally at bases, especially on the left,left-sided wheezing, few scattered rales,rhonchi , but no crepitations noted due to poor effort. Intermittent use of accessory muscles of respiration.  CARDIOVASCULAR: S1, S2 normal. No murmurs, rubs, or gallops.  ABDOMEN: Soft, nontender, nondistended. Bowel sounds present. No organomegaly or mass.  EXTREMITIES: No pedal edema, cyanosis, or clubbing.  NEUROLOGIC: Cranial nerves II through XII are intact. Muscle strength 5/5 in all extremities. Sensation intact. Gait not checked.  PSYCHIATRIC: The patient is alert,  Unable to assess orientation due to expressive aphasia Skin no obvious lesion, or ulcer.   ORDERS/RESULTS REVIEWED:   CBC  Recent Labs Lab 02/26/17 1807 02/27/17 0529 02/28/17 0349 03/01/17 0305 03/02/17 0419 03/03/17 0421  WBC 6.0 4.9 3.8 4.2 2.8* 5.8  HGB 8.1* 7.6* 7.9* 8.4* 9.8* 9.2*  HCT 24.6* 22.9* 23.7* 25.8* 29.6* 28.1*  PLT 111* 99* 95* 101* 107* 118*  MCV 88.0 87.6 90.1 90.7 89.4 89.7  MCH 28.8 29.0 30.0 29.6 29.5 29.4  MCHC 32.8 33.1 33.3 32.6 33.1 32.7  RDW 16.5* 16.9* 17.1* 17.6* 16.9* 17.1*  LYMPHSABS 0.7*  --   --   --   --   --  MONOABS 0.5  --   --   --   --   --   EOSABS 0.0  --   --   --   --   --   BASOSABS 0.1  --   --    --   --   --    ------------------------------------------------------------------------------------------------------------------  Chemistries   Recent Labs Lab 02/27/17 0529 02/28/17 0349 03/01/17 1432 03/02/17 1515 03/03/17 0421 03/04/17 0438  NA 141 145 140 139  --  141  K 4.8 4.6 4.5 5.1  --  5.8*  CL 113* 114* 111 107  --  109  CO2 23 23 22 22   --  27  GLUCOSE 86 66 176* 149*  --  123*  BUN 33* 32* 30* 30*  --  56*  CREATININE 2.98* 2.99* 2.59* 2.45* 2.64* 2.75*  CALCIUM 8.0* 8.1* 8.4* 8.7*  --  8.2*  AST  --   --  18  --   --   --   ALT  --   --  <5*  --   --   --   ALKPHOS  --   --  94  --   --   --   BILITOT  --   --  0.5  --   --   --    ------------------------------------------------------------------------------------------------------------------ estimated creatinine clearance is 14.1 mL/min (A) (by C-G formula based on SCr of 2.75 mg/dL (H)). ------------------------------------------------------------------------------------------------------------------ No results for input(s): TSH, T4TOTAL, T3FREE, THYROIDAB in the last 72 hours.  Invalid input(s): FREET3  Cardiac Enzymes  Recent Labs Lab 02/26/17 2359 02/27/17 0529 02/27/17 1107  TROPONINI <0.03 0.03* 0.03*   ------------------------------------------------------------------------------------------------------------------ Invalid input(s): POCBNP ---------------------------------------------------------------------------------------------------------------  RADIOLOGY: No results found.  EKG:  Orders placed or performed during the hospital encounter of 02/26/17  . ED EKG  . ED EKG    ASSESSMENT AND PLAN:  Active Problems:   Generalized weakness   Acute on chronic renal failure (HCC)   Anemia   Guaiac positive stools   Fall   Palliative care encounter   Goals of care, counseling/discussion  #1. Acute on chronic renal failure, Underlying CKD, stage IV, Bladder residual was only 10 cc,  creatinine Is worse today, now on IV fluids by nephrologist, renal ultrasound revealed bilateral renal cysts. Urinalysis was unremarkable, unlikely UTI . CK levels was normal. Follow creatinine in the morning. . Palliative care consultation is appreciated, pending patient's progression.   #2. Elevated troponin, likely demand ischemia, echocardiogram revealed normal ejection fraction, diastolic dysfunction, moderate tricuspid regurgitation, pulmonary hypertension, now off Lasix, no chest pains #3. Hyperkalemia, given Kayexalate today again, recheck potassium level tomorrow morning #4. Generalized weakness, fall,  physical therapist saw patient in consultation and recommended 24-hour supervision/assistance, speech therapist evaluated patient, recommended dysphagia 3 diet with thin liquids, aspiration precautions , appreciate palliative care input, pending patient's progression #5. Hemoccult positive stool, anemia, iron studies, ferritin revealed anemia of chronic disease, suspect multifactorial, chronic blood loss anemia and anemia of chronic disease due to renal failure, appreciate gastroenterologist input, no further procedures were recommended, but medical therapy,, continue Protonix , no active bleeding, hemoglobin remains stable #7. Altered mental status, Confusion, etiology is unclear, suspected medications, decreased net Remeron dose to 7.5 mg , decrease Seroquel dose if needed, following clinically appreciate neurologist input, TSH, ammonia, B12 levels were normal, no further diagnostics were recommended by Dr. Thad Ranger, MRI of the brain was unremarkable, electroencephalogram was normal.  Discontinue Flexeril. Discussed with Child psychotherapist, disposition is unclear, pending patient's  progression #8. Acidosis, metabolic, BMP is unremarkable, bicarbonate level is 27 today, lactic acid level was normal, no further interventions  Management plans discussed with the patient, family and they are in  agreement.   DRUG ALLERGIES:  Allergies  Allergen Reactions  . Penicillins Other (See Comments)    Reaction:  Unknown   . Plavix [Clopidogrel Bisulfate] Other (See Comments)    Reaction:  Unknown     CODE STATUS:     Code Status Orders        Start     Ordered   02/26/17 2326  Do not attempt resuscitation (DNR)  Continuous    Question Answer Comment  In the event of cardiac or respiratory ARREST Do not call a "code blue"   In the event of cardiac or respiratory ARREST Do not perform Intubation, CPR, defibrillation or ACLS   In the event of cardiac or respiratory ARREST Use medication by any route, position, wound care, and other measures to relive pain and suffering. May use oxygen, suction and manual treatment of airway obstruction as needed for comfort.      02/26/17 2325    Code Status History    Date Active Date Inactive Code Status Order ID Comments User Context   07/05/2016  5:47 PM 07/09/2016  7:51 PM Full Code [id number]  Marguarite ArbourSparks, Jeffrey D, MD ED   07/03/2016 11:04 AM 07/03/2016  5:26 PM Full Code [id number]  Milagros LollSudini, Srikar, MD ED   12/31/2015  5:42 PM 01/06/2016  7:12 PM Full Code [id number]  Houston SirenSainani, Vivek J, MD Inpatient   09/13/2015 10:55 PM 09/15/2015  8:25 PM Full Code [id number]  Oralia ManisWillis, David, MD Inpatient   08/28/2015  8:14 AM 08/29/2015  4:49 PM DNR [id number]  Delfino LovettShah, Vipul, MD Inpatient   08/27/2015 11:25 PM 08/28/2015  8:14 AM Full Code [id number]  Hower, Cletis Athensavid K, MD ED    Advance Directive Documentation     Most Recent Value  Type of Advance Directive  Out of facility DNR (pink MOST or yellow form)  Pre-existing out of facility DNR order (yellow form or pink MOST form)  -  "MOST" Form in Place?  -      TOTAL TIME TAKING CARE OF THIS PATIENT 35  minutes.  Discussed with social worker  Tyrica Afzal M.D on 03/04/2017 at 3:30 PM  Between 7am to 6pm - Pager - [phone]  After 6pm go to [email or web address] - password [email or web address] [facility]  [facility]  Office   [phone]  CC: Primary care physician; Merlene PullingMcGranaghan, Mary Beth, PA-C

## 2017-03-04 NOTE — Clinical Social Work Note (Signed)
CSW was informed by [name] at [facility] that they more than likely are not going to be able to take patient back with hospice. [name] stated she would be speaking with her administrators to see what input that they have. CSW contacted Clerance LavMarianne with Palliative Care and inquired if hospice home would be an option. She will be returning to re-evaluate. CSW spoke with patient's son at length this morning outside of patient's room. Patient was more lethargic today according to staff and patient's son. CSW spent time with patient's son processing his mother's current condition and future plans/discharge planning. [name] is hoping that [facility] will reconsider and take patient back at discharge because he is not able to take patient home with hospice, he does not believe his mother can participate with PT and thus would not be a candidate for rehab placement. CSW will await Palliative input and see what [name] at [facility] is able to work out. York SpanielMonica Lynk Marti MSW,LCSW [phone]

## 2017-03-04 NOTE — Progress Notes (Signed)
[facility], [place] 03/04/17  Subjective:  Patient resting in bed. Difficult to arouse over the past 2 days. Renal function worse today with a creatinine of 2.75. Potassium also up to 5.8.   Objective:  Vital signs in last 24 hours:  Temp:  [98 F (36.7 C)-98.6 F (37 C)] 98.1 F (36.7 C) (05/16 1214) Pulse Rate:  [57-63] 62 (05/16 1214) Resp:  [16-18] 16 (05/16 0502) BP: (103-136)/(51-58) 136/51 (05/16 1214) SpO2:  [96 %-100 %] 97 % (05/16 1214) Weight:  [63.5 kg (140 lb 1.6 oz)] 63.5 kg (140 lb 1.6 oz) (05/16 0502)  Weight change:  Filed Weights   03/01/17 0450 03/02/17 0500 03/04/17 0502  Weight: 62 kg (136 lb 11.2 oz) 63.5 kg (140 lb) 63.5 kg (140 lb 1.6 oz)    Intake/Output:    Intake/Output Summary (Last 24 hours) at 03/04/17 1230 Last data filed at 03/04/17 1000  Gross per 24 hour  Intake          2263.84 ml  Output                0 ml  Net          2263.84 ml     Physical Exam: General: NAD, laying in bed  HEENT Left periorbital ecchymosis, OM moist  Neck supple  Pulm/lungs Normal effort, CTAB  CVS/Heart S1S2 no rubs  Abdomen:  Soft, NTND  Extremities: No edema  Neurologic: Lethargic, not following commands             Basic Metabolic Panel:   Recent Labs Lab 02/27/17 0529 02/28/17 0349 03/01/17 1432 03/02/17 1515 03/03/17 0421 03/04/17 0438  NA 141 145 140 139  --  141  K 4.8 4.6 4.5 5.1  --  5.8*  CL 113* 114* 111 107  --  109  CO2 23 23 22 22   --  27  GLUCOSE 86 66 176* 149*  --  123*  BUN 33* 32* 30* 30*  --  56*  CREATININE 2.98* 2.99* 2.59* 2.45* 2.64* 2.75*  CALCIUM 8.0* 8.1* 8.4* 8.7*  --  8.2*     CBC:  Recent Labs Lab 02/26/17 1807 02/27/17 0529 02/28/17 0349 03/01/17 0305 03/02/17 0419 03/03/17 0421  WBC 6.0 4.9 3.8 4.2 2.8* 5.8  NEUTROABS 4.6  --   --   --   --   --   HGB 8.1* 7.6* 7.9* 8.4* 9.8* 9.2*  HCT 24.6* 22.9* 23.7* 25.8* 29.6* 28.1*  MCV 88.0 87.6 90.1 90.7 89.4 89.7   PLT 111* 99* 95* 101* 107* 118*     No results found for: HEPBSAG, HEPBSAB, HEPBIGM    Microbiology:  Recent Results (from the past 240 hour(s))  MRSA PCR Screening     Status: None   Collection Time: 03/01/17  5:05 PM  Result Value Ref Range Status   MRSA by PCR NEGATIVE NEGATIVE Final    Comment:        The GeneXpert MRSA Assay (FDA approved for NASAL specimens only), is one component of a comprehensive MRSA colonization surveillance program. It is not intended to diagnose MRSA infection nor to guide or monitor treatment for MRSA infections.     Coagulation Studies: No results for input(s): LABPROT, INR in the last 72 hours.  Urinalysis: No results for input(s): COLORURINE, LABSPEC, PHURINE, GLUCOSEU, HGBUR, BILIRUBINUR, KETONESUR, PROTEINUR, UROBILINOGEN, NITRITE, LEUKOCYTESUR in the last 72 hours.  Invalid input(s): APPERANCEUR    Imaging: Mr Brain Wo Contrast  Result  Date: 03/02/2017 CLINICAL DATA:  83 year old female with confusion. Acute on chronic renal failure, anemia. EXAM: MRI HEAD WITHOUT CONTRAST TECHNIQUE: Multiplanar, multiecho pulse sequences of the brain and surrounding structures were obtained without intravenous contrast. COMPARISON:  Head CT without contrast 02/26/2017. Brain MRI 01/31/2017, and earlier. FINDINGS: Brain: Stable cerebral volume. No restricted diffusion to suggest acute infarction. No midline shift, mass effect, evidence of mass lesion, ventriculomegaly, extra-axial collection or acute intracranial hemorrhage. Cervicomedullary junction and pituitary are within normal limits. Stable gray and white matter signal throughout the brain. Scattered nonspecific cerebral white matter T2 and FLAIR hyperintensity. No cortical encephalomalacia arch that no cortical encephalomalacia or chronic cerebral blood products are identified. Deep gray matter nuclei, brainstem, and cerebellum remain normal for age. Temporal lobe structures appear stable and normal  for age. Vascular: Major intracranial vascular flow voids are stable. Skull and upper cervical spine: Negative. Normal bone marrow signal. Sinuses/Orbits: Stable and negative. Other: Visible internal auditory structures appear normal. Trace mastoid effusions are stable. Negative scalp soft tissues. IMPRESSION: No acute intracranial abnormality. Continued stable noncontrast MRI appearance of the brain. Electronically Signed   By: Odessa FlemingH  Hall M.D.   On: 03/02/2017 13:42     Medications:   . dextrose 5 % and 0.9% NaCl 50 mL/hr at 03/04/17 0531   . acidophilus  1 capsule Oral BID  . amLODipine  10 mg Oral Daily  . atorvastatin  10 mg Oral QHS  . budesonide (PULMICORT) nebulizer solution  0.25 mg Nebulization BID  . carbidopa-levodopa  1 tablet Oral TID  . diclofenac sodium  2 g Topical BID  . donepezil  10 mg Oral Daily  . escitalopram  10 mg Oral Daily  . feeding supplement (ENSURE ENLIVE)  237 mL Oral BID BM  . ferrous sulfate  325 mg Oral BID  . levETIRAcetam  250 mg Oral Daily  . mirtazapine  15 mg Oral QHS  . multivitamin-lutein   Oral Daily  . mupirocin cream   Topical Daily  . pantoprazole  40 mg Oral Daily  . predniSONE  40 mg Oral Q breakfast   Followed by  . [START ON 03/05/2017] predniSONE  30 mg Oral Q breakfast   Followed by  . [START ON 03/06/2017] predniSONE  20 mg Oral Q breakfast   Followed by  . [START ON 03/07/2017] predniSONE  10 mg Oral Q breakfast  . QUEtiapine  25 mg Oral QHS  . vitamin B-12  1,000 mcg Oral Daily   acetaminophen **OR** acetaminophen, cyclobenzaprine, ipratropium-albuterol, ondansetron **OR** ondansetron (ZOFRAN) IV  Assessment/ Plan:  83 y.o.caucsian female with medical problems of Parkinson's disease, dementia,stroke, depression, pulmonary hypertension, who [facility]2017 for evaluation of mental status changes status post fall.    1. Acute renal failure. Unclear cause but likely from hemodynamic instability  : renal function  continues to be worse.  BUN up to 56 with a creatinine of 2.7.  Patient would not make a good dialysis candidate.  Continue gentle hydration for now.  2. Chronic kidney disease stage IV - baseline creatinine 1.82/GFR 25 Underlying CKD is likely secondary to atherosclerosis -  Creatinine rising as above.  3.  Anemia of CKD:  Hemoglobin 9.2 at last check.  No indication for Procrit currently.  4. Hyperkalemia.  Serum potassium currently 5.8.  Administer Kayexalate 30 g by mouth x1.   LOS: 6 Beyza Bellino 5/16/201812:30 PM  [facility] [place], [place] [phone]

## 2017-03-05 DIAGNOSIS — R4701 Aphasia: Secondary | ICD-10-CM

## 2017-03-05 LAB — BASIC METABOLIC PANEL
ANION GAP: 8 (ref 5–15)
BUN: 61 mg/dL — AB (ref 6–20)
CALCIUM: 8.1 mg/dL — AB (ref 8.9–10.3)
CO2: 25 mmol/L (ref 22–32)
Chloride: 108 mmol/L (ref 101–111)
Creatinine, Ser: 2.23 mg/dL — ABNORMAL HIGH (ref 0.44–1.00)
GFR calc Af Amer: 22 mL/min — ABNORMAL LOW (ref >60)
GFR calc non Af Amer: 19 mL/min — ABNORMAL LOW (ref >60)
Glucose, Bld: 147 mg/dL — ABNORMAL HIGH (ref 65–99)
POTASSIUM: 4.7 mmol/L (ref 3.5–5.1)
SODIUM: 141 mmol/L (ref 135–145)

## 2017-03-05 LAB — GLUCOSE, CAPILLARY: GLUCOSE-CAPILLARY: 114 mg/dL — AB (ref 65–99)

## 2017-03-05 MED ORDER — HEPARIN SODIUM (PORCINE) 5000 UNIT/ML IJ SOLN
5000.0000 [IU] | Freq: Three times a day (TID) | INTRAMUSCULAR | Status: DC
  Administered 2017-03-05 – 2017-03-09 (×11): 5000 [IU] via SUBCUTANEOUS
  Filled 2017-03-05 (×11): qty 1

## 2017-03-05 NOTE — Progress Notes (Signed)
[facility], [place] 03/05/17  Subjective:  Patient resting comfortably in bed at the moment. She is a bit more arousable today. Creatinine has come down to 2.2.   Objective:  Vital signs in last 24 hours:  Temp:  [97.8 F (36.6 C)-98.2 F (36.8 C)] 98.2 F (36.8 C) (05/17 0534) Pulse Rate:  [57-66] 57 (05/17 0534) Resp:  [16-20] 16 (05/17 0534) BP: (120-144)/(52-62) 144/52 (05/17 0534) SpO2:  [96 %-99 %] 99 % (05/17 0534) Weight:  [63.5 kg (140 lb)] 63.5 kg (140 lb) (05/17 0500)  Weight change: -0.045 kg (-1.6 oz) Filed Weights   03/02/17 0500 03/04/17 0502 03/05/17 0500  Weight: 63.5 kg (140 lb) 63.5 kg (140 lb 1.6 oz) 63.5 kg (140 lb)    Intake/Output:    Intake/Output Summary (Last 24 hours) at 03/05/17 1436 Last data filed at 03/05/17 1151  Gross per 24 hour  Intake             1295 ml  Output                0 ml  Net             1295 ml     Physical Exam: General: NAD, laying in bed  HEENT Left facial ecchymosis, OM moist  Neck supple  Pulm/lungs Normal effort, CTAB  CVS/Heart S1S2 no rubs  Abdomen:  Soft, NTND  Extremities: No edema  Neurologic: Lethargic, but arousable, follows simple commands, confused             Basic Metabolic Panel:   Recent Labs Lab 02/28/17 0349 03/01/17 1432 03/02/17 1515 03/03/17 0421 03/04/17 0438 03/05/17 0944  NA 145 140 139  --  141 141  K 4.6 4.5 5.1  --  5.8* 4.7  CL 114* 111 107  --  109 108  CO2 23 22 22   --  27 25  GLUCOSE 66 176* 149*  --  123* 147*  BUN 32* 30* 30*  --  56* 61*  CREATININE 2.99* 2.59* 2.45* 2.64* 2.75* 2.23*  CALCIUM 8.1* 8.4* 8.7*  --  8.2* 8.1*     CBC:  Recent Labs Lab 02/26/17 1807 02/27/17 0529 02/28/17 0349 03/01/17 0305 03/02/17 0419 03/03/17 0421  WBC 6.0 4.9 3.8 4.2 2.8* 5.8  NEUTROABS 4.6  --   --   --   --   --   HGB 8.1* 7.6* 7.9* 8.4* 9.8* 9.2*  HCT 24.6* 22.9* 23.7* 25.8* 29.6* 28.1*  MCV 88.0 87.6 90.1 90.7 89.4 89.7  PLT  111* 99* 95* 101* 107* 118*     No results found for: HEPBSAG, HEPBSAB, HEPBIGM    Microbiology:  Recent Results (from the past 240 hour(s))  MRSA PCR Screening     Status: None   Collection Time: 03/01/17  5:05 PM  Result Value Ref Range Status   MRSA by PCR NEGATIVE NEGATIVE Final    Comment:        The GeneXpert MRSA Assay (FDA approved for NASAL specimens only), is one component of a comprehensive MRSA colonization surveillance program. It is not intended to diagnose MRSA infection nor to guide or monitor treatment for MRSA infections.     Coagulation Studies: No results for input(s): LABPROT, INR in the last 72 hours.  Urinalysis: No results for input(s): COLORURINE, LABSPEC, PHURINE, GLUCOSEU, HGBUR, BILIRUBINUR, KETONESUR, PROTEINUR, UROBILINOGEN, NITRITE, LEUKOCYTESUR in the last 72 hours.  Invalid input(s): APPERANCEUR    Imaging: No results found.  Medications:   . dextrose 5 % and 0.9% NaCl 50 mL/hr at 03/05/17 0245   . acidophilus  1 capsule Oral BID  . amLODipine  10 mg Oral Daily  . atorvastatin  10 mg Oral QHS  . budesonide (PULMICORT) nebulizer solution  0.25 mg Nebulization BID  . carbidopa-levodopa  1 tablet Oral TID  . diclofenac sodium  2 g Topical BID  . donepezil  10 mg Oral Daily  . escitalopram  10 mg Oral Daily  . feeding supplement (ENSURE ENLIVE)  237 mL Oral BID BM  . ferrous sulfate  325 mg Oral BID  . levETIRAcetam  250 mg Oral Daily  . mirtazapine  7.5 mg Oral QHS  . multivitamin-lutein   Oral Daily  . mupirocin cream   Topical Daily  . pantoprazole  40 mg Oral Daily  . [START ON 03/06/2017] predniSONE  20 mg Oral Q breakfast   Followed by  . [START ON 03/07/2017] predniSONE  10 mg Oral Q breakfast  . QUEtiapine  12.5 mg Oral QHS  . vitamin B-12  1,000 mcg Oral Daily   acetaminophen **OR** acetaminophen, ipratropium-albuterol, ondansetron **OR** ondansetron (ZOFRAN) IV  Assessment/ Plan:  83 y.o.caucsian female with  medical problems of Parkinson's disease, dementia,stroke, depression, pulmonary hypertension, who was [facility] for evaluation of mental status changes status post fall.    1. Acute renal failure. Unclear cause but likely from hemodynamic instability  : creatinine has come down a bit and is currently 2.2 but still above baseline.  Continue IV fluid hydration.  2. Chronic kidney disease stage IV - baseline creatinine 1.82/GFR 25 Underlying CKD is likely secondary to atherosclerosis -  Current creatinine remains above her baseline.  Continue to monitor the creatinine trend.  3.  Anemia of CKD:  Most recent hemoglobin was 9.2.  Hold off on Epogen for now.  4. Hyperkalemia.  Serum potassium down to 4.7 status post treatment.  Continue to monitor serum potassium.   LOS: 7 Stacey Baker 5/17/20182:36 PM  [facility] [place], [place] [phone]

## 2017-03-05 NOTE — Progress Notes (Signed)
[facility] - [place] at [facility]   PATIENT NAME: Valda Christenson    MR#:  [id number]  DATE OF BIRTH:  Nov 06, 1931  SUBJECTIVE:  CHIEF COMPLAINT:   Chief Complaint  Patient presents with  . Urinary Frequency   Patient is 83 year old Caucasian female with past medical history significant for history of CAD stage III, pericardial, pleural effusions, chronic diastolic CHF, seizures, Parkinson's disease, memory loss, aphasia, who presents to the hospital with confusion. She was noted to have worsening renal failure with creatinine level of 3.2, up from 1.8 baseline. She was also noted to be anemic, she was noted to have slightly positive Hemoccult of stool. She was hydrated on admission, her creatinine level has improved, however hemoglobin level was found to be low at 7.9, no active bleeding was noted. Patient Has difficulty expressing herself today, more somnolent, unable to get review of systems. ABGs revealed mild acidosis, which was  metabolic. Patient was seen by neurologist, ammonia, TSH,vitamin B12 levels were checked, Normal, no further neurologic interventions were recommended.. Patient feels comfortable today, more alert. Recent chest x-ray revealed worsening edema, pleural effusions, patient was given Lasix intravenously yesterday with improvement of her respiratory status, still remains on 3 L of oxygen through nasal cannula at present. Kidney function has improved. According to care management/social work, patient's mental status has abruptly changed since prior, concerning for possible neurologic event. MRI of the brain revealed no acute changes, electroencephalogram Was normal. Patient Seems to be less somnolent today since her Remeron and Seroquel doses were decreased. Kidney function has improved with IV fluid administration. Oral intake is between 25-75% of offered to meals. Physical therapist reevaluation is pending.   Review of Systems  Unable to perform  ROS: Language  excessive aphasia, expressive aphasia  VITAL SIGNS: Blood pressure 135/61, pulse 61, temperature 98 F (36.7 C), temperature source Oral, resp. rate 17, height 5\' 3"  (1.6 m), weight 63.5 kg (140 lb), SpO2 98 %.  PHYSICAL EXAMINATION:   GENERAL:  83 y.o.-year-old patient lying in the bed with no acute distress, alert alert today  EYES: Pupils equal, round, reactive to light and accommodation. No scleral icterus. Extraocular muscles intact.  HEENT: Head atraumatic, normocephalic. Oropharynx and nasopharynx clear. Bruising of her left periorbital area NECK:  Supple, no jugular venous distention. No thyroid enlargement, no tenderness.  LUNGS:  diminished breath sounds bilaterally at bases, especially on the left,left-sided wheezing, few scattered rales,rhonchi , but no crepitations noted due to poor effort. Intermittent use of accessory muscles of respiration.  CARDIOVASCULAR: S1, S2 normal. No murmurs, rubs, or gallops.  ABDOMEN: Soft, nontender, nondistended. Bowel sounds present. No organomegaly or mass.  EXTREMITIES: No pedal edema, cyanosis, or clubbing.  NEUROLOGIC: Cranial nerves II through XII are intact. Muscle strength 5/5 in all extremities. Sensation intact. Gait not checked.  PSYCHIATRIC: The patient is alert,  He will times a few questions appropriately  Skin no obvious lesion, or ulcer.   ORDERS/RESULTS REVIEWED:   CBC  Recent Labs Lab 02/26/17 1807 02/27/17 0529 02/28/17 0349 03/01/17 0305 03/02/17 0419 03/03/17 0421  WBC 6.0 4.9 3.8 4.2 2.8* 5.8  HGB 8.1* 7.6* 7.9* 8.4* 9.8* 9.2*  HCT 24.6* 22.9* 23.7* 25.8* 29.6* 28.1*  PLT 111* 99* 95* 101* 107* 118*  MCV 88.0 87.6 90.1 90.7 89.4 89.7  MCH 28.8 29.0 30.0 29.6 29.5 29.4  MCHC 32.8 33.1 33.3 32.6 33.1 32.7  RDW 16.5* 16.9* 17.1* 17.6* 16.9* 17.1*  LYMPHSABS 0.7*  --   --   --   --   --  MONOABS 0.5  --   --   --   --   --   EOSABS 0.0  --   --   --   --   --   BASOSABS 0.1  --   --   --   --    --    ------------------------------------------------------------------------------------------------------------------  Chemistries   Recent Labs Lab 02/28/17 0349 03/01/17 1432 03/02/17 1515 03/03/17 0421 03/04/17 0438 03/05/17 0944  NA 145 140 139  --  141 141  K 4.6 4.5 5.1  --  5.8* 4.7  CL 114* 111 107  --  109 108  CO2 23 22 22   --  27 25  GLUCOSE 66 176* 149*  --  123* 147*  BUN 32* 30* 30*  --  56* 61*  CREATININE 2.99* 2.59* 2.45* 2.64* 2.75* 2.23*  CALCIUM 8.1* 8.4* 8.7*  --  8.2* 8.1*  AST  --  18  --   --   --   --   ALT  --  <5*  --   --   --   --   ALKPHOS  --  94  --   --   --   --   BILITOT  --  0.5  --   --   --   --    ------------------------------------------------------------------------------------------------------------------ estimated creatinine clearance is 17.4 mL/min (A) (by C-G formula based on SCr of 2.23 mg/dL (H)). ------------------------------------------------------------------------------------------------------------------ No results for input(s): TSH, T4TOTAL, T3FREE, THYROIDAB in the last 72 hours.  Invalid input(s): FREET3  Cardiac Enzymes  Recent Labs Lab 02/26/17 2359 02/27/17 0529 02/27/17 1107  TROPONINI <0.03 0.03* 0.03*   ------------------------------------------------------------------------------------------------------------------ Invalid input(s): POCBNP ---------------------------------------------------------------------------------------------------------------  RADIOLOGY: No results found.  EKG:  Orders placed or performed during the hospital encounter of 02/26/17  . ED EKG  . ED EKG    ASSESSMENT AND PLAN:  Active Problems:   Generalized weakness   Acute on chronic renal failure (HCC)   Anemia   Guaiac positive stools   Fall   Palliative care encounter   Goals of care, counseling/discussion   Expressive aphasia  #1. Acute on chronic renal failure, Underlying CKD, stage IV, Bladder residual was  only 10 cc, creatinine Has improved today on IV fluids by nephrologist, renal ultrasound revealed bilateral renal cysts. Urinalysis was unremarkable, unlikely UTI . CK levels was normal. Follow creatinine in the morning. . Palliative care consultation is appreciated, pending patient's progression.   #2. Elevated troponin,  due to demand ischemia, echocardiogram revealed normal ejection fraction, diastolic dysfunction, moderate tricuspid regurgitation, pulmonary hypertension, now off Lasix, no chest pains #3. Hyperkalemia, status post Kayexalate yesterday, improved  potassium level  #4. Generalized weakness, fall,  physical therapist saw patient in consultation and recommended 24-hour supervision/assistance, speech therapist evaluated patient, recommended dysphagia 3 diet with thin liquids, aspiration precautions , appreciate palliative care input, pending patient's progression #5. Hemoccult positive stool, anemia, iron studies, ferritin revealed anemia of chronic disease, suspect multifactorial, chronic blood loss anemia and anemia of chronic disease due to renal failure, appreciate gastroenterologist input, no further procedures were recommended, but medical therapy,, continue Protonix , no active bleeding, hemoglobin remains stable #7. Altered mental status,  suspected medications, decreased Remeron dose to 7.5 mg , Seroquel dose  to 12.5 daily at bedtime,  following clinically,  appreciate neurologist input, TSH, ammonia, B12 levels were normal, no further diagnostics were recommended by Dr. Thad Ranger, MRI of the brain was unremarkable, electroencephalogram was normal.  Now off Flexeril. Discussed with  Child psychotherapistsocial worker, disposition is unclear, pending patient's progression, repeat physical therapist evaluation #8. Acidosis, metabolic, BMP is unremarkable, bicarbonate level is 27 today, lactic acid level was normal, no further interventions  Management plans discussed with the patient, family and they are in  agreement.   DRUG ALLERGIES:  Allergies  Allergen Reactions  . Penicillins Other (See Comments)    Reaction:  Unknown   . Plavix [Clopidogrel Bisulfate] Other (See Comments)    Reaction:  Unknown     CODE STATUS:     Code Status Orders        Start     Ordered   02/26/17 2326  Do not attempt resuscitation (DNR)  Continuous    Question Answer Comment  In the event of cardiac or respiratory ARREST Do not call a "code blue"   In the event of cardiac or respiratory ARREST Do not perform Intubation, CPR, defibrillation or ACLS   In the event of cardiac or respiratory ARREST Use medication by any route, position, wound care, and other measures to relive pain and suffering. May use oxygen, suction and manual treatment of airway obstruction as needed for comfort.      02/26/17 2325    Code Status History    Date Active Date Inactive Code Status Order ID Comments User Context   07/05/2016  5:47 PM 07/09/2016  7:51 PM Full Code [id number]  Marguarite ArbourSparks, Jeffrey D, MD ED   07/03/2016 11:04 AM 07/03/2016  5:26 PM Full Code [id number]  Milagros LollSudini, Srikar, MD ED   12/31/2015  5:42 PM 01/06/2016  7:12 PM Full Code [id number]  Houston SirenSainani, Vivek J, MD Inpatient   09/13/2015 10:55 PM 09/15/2015  8:25 PM Full Code [id number]  Oralia ManisWillis, David, MD Inpatient   08/28/2015  8:14 AM 08/29/2015  4:49 PM DNR [id number]  Delfino LovettShah, Vipul, MD Inpatient   08/27/2015 11:25 PM 08/28/2015  8:14 AM Full Code [id number]  Hower, Cletis Athensavid K, MD ED    Advance Directive Documentation     Most Recent Value  Type of Advance Directive  Out of facility DNR (pink MOST or yellow form)  Pre-existing out of facility DNR order (yellow form or pink MOST form)  -  "MOST" Form in Place?  -      TOTAL TIME TAKING CARE OF THIS PATIENT 30  minutes.  Discussed with care management today   Ashir Kunz M.D on 03/05/2017 at 4:17 PM  Between 7am to 6pm - Pager - [phone]  After 6pm go to [email or web address] - password [email or web address] [facility]  [facility]    Office  [phone]  CC: Primary care physician; Merlene PullingMcGranaghan, Mary Beth, PA-C

## 2017-03-05 NOTE — Progress Notes (Signed)
   03/05/17 1300  PT Visit Information  Last PT Received On 03/05/17  Assistance Needed +2  History of Present Illness Pt. is an 83 y.o. female who [facility] with Acute on Chronic Renal Failure. PMHx includes: CAD stage III, pleural effusion, chronis dialstolic CHF, seizures, Parkinson's Disease, seizures, Memory Loss, and Aphasia.  Subjective Data  Subjective fairly non verbal talked about her Parkinsons  Patient Stated Goal none stated  Precautions  Precautions Fall  Restrictions  Weight Bearing Restrictions No  Pain Assessment  Pain Assessment Faces  Pain Score 0  Cognition  Arousal/Alertness Lethargic  Behavior During Therapy Flat affect  Overall Cognitive Status No family/caregiver present to determine baseline cognitive functioning  Bed Mobility  Overal bed mobility Needs Assistance  Bed Mobility Supine to Sit;Sit to Supine  Supine to sit Max assist  Sit to supine Total assist  General bed mobility comments listed to L side even after trying to shift her to R for some stretch and postural control  Transfers  Overall transfer level Needs assistance  General transfer comment unable to stand her due to dense weakness  Ambulation/Gait  General Gait Details not ambulatory today  Balance  Sitting-balance support Feet supported  Sitting balance-Leahy Scale Zero  Sitting balance - Comments listing to L and backward, cannot hold her balance  PT - End of Session  Equipment Utilized During Treatment Oxygen  Activity Tolerance Patient limited by lethargy  Patient left in bed;with call bell/phone within reach;with bed alarm set  Nurse Communication Mobility status  PT - Assessment/Plan  PT Plan Discharge plan needs to be updated  PT Visit Diagnosis Muscle weakness (generalized) (M62.81);Difficulty in walking, not elsewhere classified (R26.2);History of falling (Z91.81)  PT Frequency (ACUTE ONLY) Min 2X/week  Follow Up Recommendations SNF  PT equipment None recommended  by PT  AM-PAC PT "6 Clicks" Daily Activity Outcome Measure  Difficulty turning over in bed (including adjusting bedclothes, sheets and blankets)? 1  Difficulty moving from lying on back to sitting on the side of the bed?  1  Difficulty sitting down on and standing up from a chair with arms (e.g., wheelchair, bedside commode, etc,.)? 1  Help needed moving to and from a bed to chair (including a wheelchair)? 1  Help needed walking in hospital room? 1  Help needed climbing 3-5 steps with a railing?  1  6 Click Score 6  Mobility G Code  CN  PT Goal Progression  Progress towards PT goals Not progressing toward goals - comment (needs dense help wiht all mobility)  PT Time Calculation  PT Start Time (ACUTE ONLY) 1255  PT Stop Time (ACUTE ONLY) 1320  PT Time Calculation (min) (ACUTE ONLY) 25 min  PT G-Codes **NOT FOR INPATIENT CLASS**  Functional Assessment Tool Used AM-PAC 6 Clicks Basic Mobility  PT General Charges  $$ ACUTE PT VISIT 1 Procedure  PT Treatments  $Therapeutic Activity 23-37 mins  Samul Dadauth Sanae Willetts, PT MS Acute Rehab Dept. Number: [facility] [phone] and [facility] [phone]

## 2017-03-05 NOTE — Progress Notes (Signed)
Initial Nutrition Assessment  DOCUMENTATION CODES:   Not applicable  INTERVENTION:   Ensure Enlive po BID, each supplement provides 350 kcal and 20 grams of protein  NUTRITION DIAGNOSIS:   Inadequate oral intake related to other (see comment), chronic illness (altered mental status ) as evidenced by calorie count. Resolving   GOAL:   Patient will meet greater than or equal to 90% of their needs  MONITOR:   PO intake, Supplement acceptance, Labs, Weight trends, Skin  ASSESSMENT:   83 year old Caucasian female with past medical history significant for history of CAD stage III, pericardial, pleural effusions, chronic diastolic CHF, seizures, Parkinson's disease, memory loss, aphasia, who presents to the hospital with confusion.   Calorie count completed pt eating 75-100% meals and drinking Ensure. Per chart pt is weight stable. Palliative care following; [facility].   Medications reviewed and include: acidophilus, ferrous sulfate, remeron, MVI, protonix, Vit B-12  Labs reviewed: BUN 61(H), creat 2.23(H), Ca 8.1(L) Hgb 9.2(L), Hct 28.1(L)  Diet Order:  DIET DYS 3 Room service appropriate? Yes with Assist; Fluid consistency: Thin  Skin:  Wound (see comment) (wound finger)  Last BM:  5/14  Height:   Ht Readings from Last 1 Encounters:  02/26/17 5\' 3"  (1.6 m)    Weight:   Wt Readings from Last 1 Encounters:  03/05/17 140 lb (63.5 kg)    Ideal Body Weight:  52.3 kg  BMI:  Body mass index is 24.8 kg/m.  Estimated Nutritional Needs:   Kcal:  1400-1600kcal/day   Protein:  70-83g/day   Fluid:  >1.4L/day   EDUCATION NEEDS:   No education needs identified at this time  Betsey Holidayasey Day Deery MS, RD, LDN Pager #- [phone]

## 2017-03-05 NOTE — Progress Notes (Deleted)
   03/05/17 1300  PT Visit Information  Last PT Received On 03/05/17  Assistance Needed +2  History of Present Illness Pt. is an 83 y.o. female who [facility] with Acute on Chronic Renal Failure. PMHx includes: CAD stage III, pleural effusion, chronis dialstolic CHF, seizures, Parkinson's Disease, seizures, Memory Loss, and Aphasia.  Subjective Data  Subjective fairly non verbal talked about her Parkinsons  Patient Stated Goal none stated  Precautions  Precautions Fall  Restrictions  Weight Bearing Restrictions No  Pain Assessment  Pain Assessment Faces  Pain Score 0  Cognition  Arousal/Alertness Lethargic  Behavior During Therapy Flat affect  Overall Cognitive Status No family/caregiver present to determine baseline cognitive functioning  Bed Mobility  Overal bed mobility Needs Assistance  Bed Mobility Supine to Sit;Sit to Supine  Supine to sit Max assist  Sit to supine Total assist  General bed mobility comments listed to L side even after trying to shift her to R for some stretch and postural control  Transfers  Overall transfer level Needs assistance  General transfer comment unable to stand her due to dense weakness  Ambulation/Gait  General Gait Details not ambulatory today  Balance  Sitting-balance support Feet supported  Sitting balance-Leahy Scale Zero  Sitting balance - Comments listing to L and backward, cannot hold her balance  PT - End of Session  Equipment Utilized During Treatment Oxygen  Activity Tolerance Patient limited by lethargy  Patient left in bed;with call bell/phone within reach;with bed alarm set  Nurse Communication Mobility status  PT - Assessment/Plan  PT Plan Current plan remains appropriate  PT Visit Diagnosis Muscle weakness (generalized) (M62.81);Difficulty in walking, not elsewhere classified (R26.2);History of falling (Z91.81)  PT Frequency (ACUTE ONLY) Min 2X/week  Follow Up Recommendations Supervision/Assistance - 24 hour  PT  equipment None recommended by PT  AM-PAC PT "6 Clicks" Daily Activity Outcome Measure  Difficulty turning over in bed (including adjusting bedclothes, sheets and blankets)? 1  Difficulty moving from lying on back to sitting on the side of the bed?  1  Difficulty sitting down on and standing up from a chair with arms (e.g., wheelchair, bedside commode, etc,.)? 1  Help needed moving to and from a bed to chair (including a wheelchair)? 1  Help needed walking in hospital room? 1  Help needed climbing 3-5 steps with a railing?  1  6 Click Score 6  Mobility G Code  CN  PT Goal Progression  Progress towards PT goals Not progressing toward goals - comment (needs dense help wiht all mobility)  PT Time Calculation  PT Start Time (ACUTE ONLY) 1255  PT Stop Time (ACUTE ONLY) 1320  PT Time Calculation (min) (ACUTE ONLY) 25 min  PT G-Codes **NOT FOR INPATIENT CLASS**  Functional Assessment Tool Used AM-PAC 6 Clicks Basic Mobility  PT General Charges  $$ ACUTE PT VISIT 1 Procedure  PT Treatments  $Therapeutic Activity 23-37 mins  Samul Dadauth Jo-Anne Kluth, PT MS Acute Rehab Dept. Number: [facility] [phone] and [facility] [phone]

## 2017-03-05 NOTE — Progress Notes (Signed)
OT Cancellation Note  Patient Details Name: Stacey ManchesterMiriam Baker MRN: [id number] DOB: 08/26/1932   Cancelled Treatment:    Reason Eval/Treat Not Completed: Fatigue/lethargy limiting ability to participate. Spoke to PT following pt's PT session, pt very fatigued, unable to meaningfully participate in OT treatment session. Will continue to monitor and re-attempt OT treatment at later time when pt is less fatigued.  Richrd PrimeJamie Stiller, MPH, MS, OTR/L ascom [phone] 03/05/17, 4:52 PM

## 2017-03-05 NOTE — Progress Notes (Signed)
Daily Progress Note   Patient Name: Stacey Baker       Date: 03/05/2017 DOB: May 12, 1932  Age: 83 y.o. MRN#: [id number] Attending Physician: Katharina Caper, MD Primary Care Physician: Merlene Pulling, PA-C Admit Date: 02/26/2017  Reason for Consultation/Follow-up: Disposition, Establishing goals of care and Psychosocial/spiritual support  Subjective: Patient is feeling better overall.  She denies pain.  We talked about [employer] football and being from [place].  She is willing to work with physical therapy today and hopes to return to [facility] at discharge.  I sent a message to her son to update him.  Assessment: 83 yo female with parkinson's, multiple CVAs, and dementia.  Appears to be improving with care.  Kidney function is trending in the right direction (baseline creatinine is approximately 1.2 - 1.6), hgb is stable.  She is less lethargic and confused.  Mental status returning to baseline. She is eating.    Patient Profile/HPI: 83 y.o.femalewith past medical history of CVA with residual apraxia, parkinsons, dementia, CKD 3, CHF-D, and pleural effusionwho was admitted on 5/10/2018with a fall, acute renal failure as well as a drop in Hgb and heme + stool.   Length of Stay: 7  Current Medications: Scheduled Meds:  . acidophilus  1 capsule Oral BID  . amLODipine  10 mg Oral Daily  . atorvastatin  10 mg Oral QHS  . budesonide (PULMICORT) nebulizer solution  0.25 mg Nebulization BID  . carbidopa-levodopa  1 tablet Oral TID  . diclofenac sodium  2 g Topical BID  . donepezil  10 mg Oral Daily  . escitalopram  10 mg Oral Daily  . feeding supplement (ENSURE ENLIVE)  237 mL Oral BID BM  . ferrous sulfate  325 mg Oral BID  . levETIRAcetam  250 mg Oral Daily  . mirtazapine  7.5  mg Oral QHS  . multivitamin-lutein   Oral Daily  . mupirocin cream   Topical Daily  . pantoprazole  40 mg Oral Daily  . predniSONE  30 mg Oral Q breakfast   Followed by  . [START ON 03/06/2017] predniSONE  20 mg Oral Q breakfast   Followed by  . [START ON 03/07/2017] predniSONE  10 mg Oral Q breakfast  . QUEtiapine  12.5 mg Oral QHS  . vitamin B-12  1,000 mcg Oral Daily  Continuous Infusions: . dextrose 5 % and 0.9% NaCl 50 mL/hr at 03/05/17 0245    PRN Meds: acetaminophen **OR** acetaminophen, ipratropium-albuterol, ondansetron **OR** ondansetron (ZOFRAN) IV  Physical Exam       Elderly frail female with bruised face CV RRR Resp CTA Abdomen soft  Extremities no LE edema  Vital Signs: BP (!) 144/52 (BP Location: Right Arm) Comment: RN Marcella Notified  Pulse (!) 57   Temp 98.2 F (36.8 C) (Oral)   Resp 16   Ht 5\' 3"  (1.6 m)   Wt 63.5 kg (140 lb)   SpO2 99%   BMI 24.80 kg/m  SpO2: SpO2: 99 % O2 Device: O2 Device: Nasal Cannula O2 Flow Rate: O2 Flow Rate (L/min): 2 L/min  Intake/output summary:  Intake/Output Summary (Last 24 hours) at 03/05/17 1048 Last data filed at 03/05/17 0900  Gross per 24 hour  Intake             1270 ml  Output                0 ml  Net             1270 ml   LBM: Last BM Date: 03/02/17 Baseline Weight: Weight: 63.5 kg (140 lb) Most recent weight: Weight: 63.5 kg (140 lb)       Palliative Assessment/Data:    Flowsheet Rows     Most Recent Value  Intake Tab  Referral Department  Hospitalist  Unit at Time of Referral  Med/Surg Unit  Palliative Care Primary Diagnosis  Neurology  Date Notified  02/28/17  Palliative Care Type  New Palliative care  Reason for referral  Clarify Goals of Care  Date of Admission  02/26/17  # of days IP prior to Palliative referral  2  Clinical Assessment  Palliative Performance Scale Score  30%  Psychosocial & Spiritual Assessment  Palliative Care Outcomes  Patient/Family meeting held?  Yes  Who  was at the meeting?  patient and son      Patient Active Problem List   Diagnosis Date Noted  . Palliative care encounter   . Goals of care, counseling/discussion   . Acute on chronic renal failure (HCC) 02/26/2017  . Anemia 02/26/2017  . Guaiac positive stools 02/26/2017  . Fall 02/26/2017  . Hypoxia   . Atrial tachycardia (HCC)   . Generalized weakness 07/05/2016  . Thrombocytopenia (HCC) 07/05/2016  . UTI (urinary tract infection) 07/05/2016  . Acute respiratory failure (HCC) 07/05/2016  . Pleural effusion 07/05/2016  . CKD (chronic kidney disease), stage III   . Pericardial effusion   . Sinus bradycardia   . Acute renal failure (ARF) (HCC) 12/31/2015  . Chronic renal insufficiency 09/15/2015  . Bradycardia 09/15/2015  . Cellulitis 09/13/2015  . Parkinson disease (HCC) 09/13/2015  . HTN (hypertension) 09/13/2015  . HLD (hyperlipidemia) 09/13/2015  . Depression 09/13/2015  . Dementia 09/13/2015  . Hyperkalemia 09/13/2015  . TIA (transient ischemic attack) 08/27/2015    Palliative Care Plan    Recommendations/Plan:  PT evaluation.    Continue current treatment.  Hopeful for return to [facility] with Hospice Services.    PT eval pending.   Code Status:  DNR  Prognosis:   < 6 months given advanced renal failure, dementia, parkinsons, poor mobility.   Discharge Planning:  ALF with Hospice  Care plan was discussed with Son  Thank you for allowing the Palliative Medicine Team to assist in the care of this patient.  Total time spent:  25 min.     Greater than 50%  of this time was spent counseling and coordinating care related to the above assessment and plan.  Algis Downs, PA-C Palliative Medicine  Please contact Palliative MedicineTeam phone at [phone] for questions and concerns between 7 am - 7 pm.   Please see AMION for individual provider pager numbers.

## 2017-03-06 LAB — GLUCOSE, CAPILLARY: GLUCOSE-CAPILLARY: 107 mg/dL — AB (ref 65–99)

## 2017-03-06 LAB — CBC
HCT: 26.8 % — ABNORMAL LOW (ref 35.0–47.0)
Hemoglobin: 8.9 g/dL — ABNORMAL LOW (ref 12.0–16.0)
MCH: 29.2 pg (ref 26.0–34.0)
MCHC: 33 g/dL (ref 32.0–36.0)
MCV: 88.5 fL (ref 80.0–100.0)
Platelets: 114 10*3/uL — ABNORMAL LOW (ref 150–440)
RBC: 3.03 MIL/uL — ABNORMAL LOW (ref 3.80–5.20)
RDW: 16.5 % — ABNORMAL HIGH (ref 11.5–14.5)
WBC: 4.4 10*3/uL (ref 3.6–11.0)

## 2017-03-06 LAB — BASIC METABOLIC PANEL
ANION GAP: 5 (ref 5–15)
BUN: 67 mg/dL — ABNORMAL HIGH (ref 6–20)
CO2: 26 mmol/L (ref 22–32)
Calcium: 7.9 mg/dL — ABNORMAL LOW (ref 8.9–10.3)
Chloride: 108 mmol/L (ref 101–111)
Creatinine, Ser: 2.03 mg/dL — ABNORMAL HIGH (ref 0.44–1.00)
GFR calc Af Amer: 25 mL/min — ABNORMAL LOW (ref >60)
GFR calc non Af Amer: 22 mL/min — ABNORMAL LOW (ref >60)
Glucose, Bld: 131 mg/dL — ABNORMAL HIGH (ref 65–99)
Potassium: 4.7 mmol/L (ref 3.5–5.1)
Sodium: 139 mmol/L (ref 135–145)

## 2017-03-06 NOTE — Clinical Social Work Note (Signed)
CSW contacted Lupita LeashDonna, the Environmental managerHealth and Wellness Director, at [facility] this afternoon. CSW explained that [name] had not gotten back with me and I had left several messages for her regarding patient. Lupita LeashDonna informed CSW that [name] is out of town and this is why she was not returning my calls. CSW explained to Lupita LeashDonna that we needed a disposition for patient and needed to know if they will take patient back with hospice. CSW explained that patient is much more alert and interactive than 2 days ago when [name] originally came to see patient but that patient still is not mobile. Lupita LeashDonna put CSW on hold and spoke with her Librarian, academicxecutive Director and stated that they would take patient back. CSW explained that it may be Monday before we discharge her back to them as CSW has not been able to reach the son at home number or cell number today and he has not been in patient's room today. [name] will need to choose a hospice agency which will more than likely [facility] due to Lupita LeashDonna at [facility] stating that this is the most utilized hospice by them. CSW has updated the physician. York SpanielMonica Sani Madariaga MSW,LCSW [phone]

## 2017-03-06 NOTE — Progress Notes (Signed)
Occupational Therapy Treatment Patient Details Name: Stacey Baker MRN: [id number] DOB: Aug 25, 1932 Today's Date: 03/06/2017    History of present illness Pt. is an 83 y.o. female who was admitted to [facility] with Acute on Chronic Renal Failure. PMHx includes: CAD stage III, pleural effusion, chronis dialstolic CHF, seizures, Parkinson's Disease, seizures, Memory Loss, and Aphasia.   OT comments  Pt seen to work on hand to mouth using BUE and hands with increased tightness noted in LUE compared to RUE.  Able to tolerate AAROM for hand to mouth x10 and then 6 on R and 2 on L without assist.  Pt would benefit from use of foam utensil holders since she had a set at home that she was using prior to admission.  Will have weekend therapist bring her a set of red foam utensil holders for self feeding.   Follow Up Recommendations  SNF    Equipment Recommendations       Recommendations for Other Services      Precautions / Restrictions Precautions Precautions: Fall Restrictions Weight Bearing Restrictions: No       Mobility Bed Mobility                  Transfers                      Balance                                           ADL either performed or assessed with clinical judgement   ADL Overall ADL's : Needs assistance/impaired Eating/Feeding: Maximal assistance Eating/Feeding Details (indicate cue type and reason): worked on hand to mouth using BUE and hands with increased tightness noted in LUE compared to RUE.  Able to tolerate AAROM for hand to mouth x10 and then 6 on R and 2 on L without assist.  Pt would benefit from use of foam utensil holders since she had a set at home that she was using prior to admission.  Will have weekend therapist bring her a set of red foam utensil holders for self feeding. Grooming: Maximal assistance Grooming Details (indicate cue type and reason): Hand over hand for using wash cloth to face with max assist using  RUE and hand with extra time due to fatigue.     Lower Body Bathing: Total assistance   Upper Body Dressing : Total assistance   Lower Body Dressing: Total assistance   Toilet Transfer: Total assistance   Toileting- Clothing Manipulation and Hygiene: Total assistance         General ADL Comments: Pt able to follow one step commands for ADLs this session with hand over hand when she started to fatigue.     Vision       Perception     Praxis      Cognition Arousal/Alertness: Awake/alert Behavior During Therapy: Flat affect Overall Cognitive Status: No family/caregiver present to determine baseline cognitive functioning                                          Exercises     Shoulder Instructions       General Comments      Pertinent Vitals/ Pain       Pain Assessment: No/denies pain Pain Score:  0-No pain  Home Living                                          Prior Functioning/Environment              Frequency  Min 1X/week        Progress Toward Goals  OT Goals(current goals can now be found in the care plan section)  Progress towards OT goals: Progressing toward goals  Acute Rehab OT Goals Patient Stated Goal: "to get more strength" OT Goal Formulation: With patient Potential to Achieve Goals: Good  Plan Discharge plan remains appropriate    Co-evaluation                 AM-PAC PT "6 Clicks" Daily Activity     Outcome Measure   Help from another person eating meals?: Total Help from another person taking care of personal grooming?: Total Help from another person toileting, which includes using toliet, bedpan, or urinal?: Total Help from another person bathing (including washing, rinsing, drying)?: Total Help from another person to put on and taking off regular upper body clothing?: Total Help from another person to put on and taking off regular lower body clothing?: Total 6 Click Score: 6     End of Session    OT Visit Diagnosis: Muscle weakness (generalized) (M62.81)   Activity Tolerance Patient limited by fatigue   Patient Left in bed;with call bell/phone within reach;with bed alarm set;Other (comment) (Nurse present to start an IV)   Nurse Communication          Time: 1500-1530 OT Time Calculation (min): 30 min  Charges: OT General Charges $OT Visit: 1 Procedure OT Treatments $Self Care/Home Management : 23-37 mins  Susanne BordersSusan Wofford, OTR/L ascom [phone] 03/06/17, 3:38 PM

## 2017-03-06 NOTE — Progress Notes (Signed)
[facility] [place], [place] 03/06/17  Subjective:  Renal function has slowly improved. BUN down to 67 and creatinine currently 2.03. Patient is awake this a.m. and interactive.   Objective:  Vital signs in last 24 hours:  Temp:  [98 F (36.7 C)-98.7 F (37.1 C)] 98.7 F (37.1 C) (05/18 0534) Pulse Rate:  [55-68] 55 (05/18 0534) Resp:  [17-20] 17 (05/18 0534) BP: (129-135)/(46-108) 133/108 (05/18 0534) SpO2:  [91 %-98 %] 93 % (05/18 0734)  Weight change:  Filed Weights   03/02/17 0500 03/04/17 0502 03/05/17 0500  Weight: 63.5 kg (140 lb) 63.5 kg (140 lb 1.6 oz) 63.5 kg (140 lb)    Intake/Output:    Intake/Output Summary (Last 24 hours) at 03/06/17 1131 Last data filed at 03/06/17 0937  Gross per 24 hour  Intake             1990 ml  Output                0 ml  Net             1990 ml     Physical Exam: General: NAD, laying in bed  HEENT Left facial ecchymosis, OM moist  Neck supple  Pulm/lungs Normal effort, CTAB  CVS/Heart S1S2 no rubs  Abdomen:  Soft, NTND  Extremities: Trace edema  Neurologic: Awake, alert, follows simple commands             Basic Metabolic Panel:   Recent Labs Lab 03/01/17 1432 03/02/17 1515 03/03/17 0421 03/04/17 0438 03/05/17 0944 03/06/17 0456  NA 140 139  --  141 141 139  K 4.5 5.1  --  5.8* 4.7 4.7  CL 111 107  --  109 108 108  CO2 22 22  --  27 25 26   GLUCOSE 176* 149*  --  123* 147* 131*  BUN 30* 30*  --  56* 61* 67*  CREATININE 2.59* 2.45* 2.64* 2.75* 2.23* 2.03*  CALCIUM 8.4* 8.7*  --  8.2* 8.1* 7.9*     CBC:  Recent Labs Lab 02/28/17 0349 03/01/17 0305 03/02/17 0419 03/03/17 0421 03/06/17 0456  WBC 3.8 4.2 2.8* 5.8 4.4  HGB 7.9* 8.4* 9.8* 9.2* 8.9*  HCT 23.7* 25.8* 29.6* 28.1* 26.8*  MCV 90.1 90.7 89.4 89.7 88.5  PLT 95* 101* 107* 118* 114*     No results found for: HEPBSAG, HEPBSAB, HEPBIGM    Microbiology:  Recent Results (from the past 240 hour(s))  MRSA PCR Screening      Status: None   Collection Time: 03/01/17  5:05 PM  Result Value Ref Range Status   MRSA by PCR NEGATIVE NEGATIVE Final    Comment:        The GeneXpert MRSA Assay (FDA approved for NASAL specimens only), is one component of a comprehensive MRSA colonization surveillance program. It is not intended to diagnose MRSA infection nor to guide or monitor treatment for MRSA infections.     Coagulation Studies: No results for input(s): LABPROT, INR in the last 72 hours.  Urinalysis: No results for input(s): COLORURINE, LABSPEC, PHURINE, GLUCOSEU, HGBUR, BILIRUBINUR, KETONESUR, PROTEINUR, UROBILINOGEN, NITRITE, LEUKOCYTESUR in the last 72 hours.  Invalid input(s): APPERANCEUR    Imaging: No results found.   Medications:   . dextrose 5 % and 0.9% NaCl 50 mL/hr at 03/05/17 2305   . acidophilus  1 capsule Oral BID  . amLODipine  10 mg Oral Daily  . atorvastatin  10 mg Oral QHS  . budesonide (  PULMICORT) nebulizer solution  0.25 mg Nebulization BID  . carbidopa-levodopa  1 tablet Oral TID  . diclofenac sodium  2 g Topical BID  . donepezil  10 mg Oral Daily  . escitalopram  10 mg Oral Daily  . feeding supplement (ENSURE ENLIVE)  237 mL Oral BID BM  . ferrous sulfate  325 mg Oral BID  . heparin subcutaneous  5,000 Units Subcutaneous Q8H  . levETIRAcetam  250 mg Oral Daily  . mirtazapine  7.5 mg Oral QHS  . multivitamin-lutein   Oral Daily  . mupirocin cream   Topical Daily  . pantoprazole  40 mg Oral Daily  . [START ON 03/07/2017] predniSONE  10 mg Oral Q breakfast  . QUEtiapine  12.5 mg Oral QHS  . vitamin B-12  1,000 mcg Oral Daily   acetaminophen **OR** acetaminophen, ipratropium-albuterol, ondansetron **OR** ondansetron (ZOFRAN) IV  Assessment/ Plan:  83 y.o.cauc[place] female with medical problems of Parkinson's disease, dementia,stroke, depression, pulmonary hypertension, who was admitted to [facility] on 02/26/2017 for evaluation of mental status changes status post fall.     1. Acute renal failure. Unclear cause but likely from hemodynamic instability. - Renal function slowly improving. Creatinine down to 2.03. Still slightly above baseline. Continue IV fluid hydration.  2. Chronic kidney disease stage IV - baseline creatinine 1.82/GFR 25 Underlying CKD is likely secondary to atherosclerosis -  As above creatinine is trending towards her baseline.  3.  Anemia of CKD:  Hemoglobin down to 8.9. Continue to periodically monitor CBC.  4. Hyperkalemia.  Hyperkalemia appears resolved at the moment. Serum potassium currently 4.7. Continue to periodically monitor.  LOS: 8 Snyder Colavito 5/18/201811:31 AM  [address] [place], [place] [phone]

## 2017-03-06 NOTE — Progress Notes (Signed)
Speech Language Pathology Treatment: Dysphagia  Patient Details Name: Stacey Baker MRN: [id number] DOB: 1932-10-16 Today's Date: 03/06/2017 Time: 4098-1191 SLP Time Calculation (min) (ACUTE ONLY): 45 min  Assessment / Plan / Recommendation Clinical Impression  Pt continues to appear to present w/ mild oropharyngeal phase dysphagia w/ min increased risk for aspiration. When following aspiration precautions and being monitored w/ oral intake, pt's risk appears reduced. Pt consumed trials of thin liquids via straw(her Choice and preference baseline) and trials of softened/solid foods w/ inconsistent, mild throat clearing noted - delayed. Pt also exhibited a min wet vocal quality at baseline and during conversation when NOT eating/drinking - suspect d/t saliva. Pt appears less aware of this but does respond w/ a mild throat clearing intermittently for this issue. Impacting factors on pt's swallow function include medical dxs of Parkinson's Disease, Dementia, and decline Pulmonary status baseline. Pt also requires moderate feeding assistance at this time d/t weak UEs. Oral phase c/b min slower clearing d/t increased mastication attention w/ increased textures d/t weakness and missing some dentition; timely swallowing/clearing w/ liquids.  Due to pt's increased risk for aspiration and overall declined medical and Cognitive status' at this time, recommend a dysphagia level 3(w/ minced meats), thin liquids; strict aspiration precautions; Pills in puree - crushed; full feeding assistance at meals and monitoring when drinking. Recomment dietician f/u - education given on supplements. NSG/caregiver staff and family to monitor pt's Pulmonary status even at discharge to SNF for any negative sequelae from potential aspiration; reconsult ST services for assessment and need/option to modify diet to a more conservative consistency(liquids), education. Pt was able to follow through w/ verbal instruction to use a throat  clearing to aid wet vocal quality and aspiration precautions including small sips via straw but did require verbal cues for follow through, consistently(suspect d/t baseline Cognitive decline).    HPI HPI: Pt is a 83 y.o. female with a known history of CAD stage III, peripheral effusion, pleural effusion, chronic diastolic CHF, seizures, Parkinson's Disease, Dementia, TIA, and aphasia, who presents to the hospital with confusion. Patient fell down earlier today and was seen by emergency room physician, who discharged her to the facility after evaluation. She comes back due to worsening confusion. Her labs revealed worsening kidney failure with creatinine level up to 3.17 from 1.8 baseline, she was also noted to be more anemic with hemoglobin level drifting down to 8.1 from 10.5 in April 2018. Potassium was found to be elevated. At eval, Son indicated pt does eat softer foods "better" but denied any overt coughing during meals. She has become more dependent on assistance w/ eating at meals per report. Pt is alert/awake and verbally conversive. Noted weakness and stiffness in UEs(hands) impacting her ability to feed self - she stated she eats finger foods best and uses a built up handle utensil set at home but does not have it w/ her now.       SLP Plan  Continue with current plan of care w/ education and follow through w/ aspiration precautions; monitoring Pulmonary status       Recommendations  Diet recommendations: Dysphagia 3 (mechanical soft);Thin liquid Liquids provided via: Cup;Straw (monitor straw use) Medication Administration: Whole meds with puree Supervision: Patient able to self feed;Staff to assist with self feeding;Intermittent supervision to cue for compensatory strategies (UEs weakness) Compensations: Minimize environmental distractions;Slow rate;Small sips/bites;Lingual sweep for clearance of pocketing;Follow solids with liquid Postural Changes and/or Swallow Maneuvers: Seated  upright 90 degrees;Upright 30-60 min after meal  General recommendations:  (dietician consult) Oral Care Recommendations: Oral care BID;Staff/trained caregiver to provide oral care Follow up Recommendations: Skilled Nursing facility (TBD) SLP Visit Diagnosis: Dysphagia, oropharyngeal phase (R13.12) Plan: Continue with current plan of care       GO                 Stacey SomKatherine Wataru Mccowen, MS, CCC-SLP Stacey Baker 03/06/2017, 1:38 PM

## 2017-03-06 NOTE — Progress Notes (Signed)
[facility] - [place] at [facility]   PATIENT NAME: Stacey Baker    MR#:  [id number]  DATE OF BIRTH:  May 02, 1932  SUBJECTIVE:  CHIEF COMPLAINT:   Chief Complaint  Patient presents with  . Urinary Frequency   Patient is 83 year old Caucasian female with past medical history significant for history of CAD stage III, pericardial, pleural effusions, chronic diastolic CHF, seizures, Parkinson's disease, memory loss, aphasia, who presents to the hospital with confusion. She was noted to have worsening renal failure with creatinine level of 3.2, up from 1.8 baseline. She was also noted to be anemic, she was noted to have slightly positive Hemoccult of stool. She was hydrated on admission, her creatinine level has improved, however hemoglobin level was found to be low at 7.9, no active bleeding was noted. Patient Has difficulty expressing herself today, more somnolent, unable to get review of systems. ABGs revealed mild acidosis, which was  metabolic. Patient was seen by neurologist, ammonia, TSH,vitamin B12 levels were checked, Normal, no further neurologic interventions were recommended.. Patient feels comfortable today, more alert. Recent chest x-ray revealed worsening edema, pleural effusions, patient was given Lasix intravenously yesterday with improvement of her respiratory status, still remains on 3 L of oxygen through nasal cannula at present. Kidney function has improved. According to care management/social work, patient's mental status has abruptly changed since prior, concerning for possible neurologic event. MRI of the brain revealed no acute changes, electroencephalogram Was normal. Patient Feels comfortable today, is less somnolent since her Remeron and Seroquel doses were decreased, Flexeril was stopped. Kidney function has improved with IV fluid administration. Oral intake has improved and patient is eating about 75-100% of offered to meals. Physical therapist  reevaluation could not be done due to patient's weakness, disposition is pending. Patient remains on dysphagia 3 diet due to oropharyngeal dysphagia   Review of Systems  Unable to perform ROS: Language  excessive aphasia, expressive aphasia  VITAL SIGNS: Blood pressure (!) 125/39, pulse 61, temperature 98.2 F (36.8 C), temperature source Oral, resp. rate 20, height 5\' 3"  (1.6 m), weight 63.5 kg (140 lb), SpO2 94 %.  PHYSICAL EXAMINATION:   GENERAL:  83 y.o.-year-old patient lying in the bed with no acute distress, alert alert today  EYES: Pupils equal, round, reactive to light and accommodation. No scleral icterus. Extraocular muscles intact.  HEENT: Head atraumatic, normocephalic. Oropharynx and nasopharynx clear. Bruising of her left periorbital area NECK:  Supple, no jugular venous distention. No thyroid enlargement, no tenderness.  LUNGS:  Better air entrance overall, increased effort,  few scattered rales,rhonchi , but no crepitations noted , no use of accessory muscles of respiration.  CARDIOVASCULAR: S1, S2 normal. No murmurs, rubs, or gallops.  ABDOMEN: Soft, nontender, nondistended. Bowel sounds present. No organomegaly or mass.  EXTREMITIES: No pedal edema, cyanosis, or clubbing.  NEUROLOGIC: Cranial nerves II through XII are intact. Muscle strength 5/5 in all extremities. Sensation intact. Gait not checked.  PSYCHIATRIC: The patient is alert,  He will times a few questions appropriately  Skin no obvious lesion, or ulcer.   ORDERS/RESULTS REVIEWED:   CBC  Recent Labs Lab 02/28/17 0349 03/01/17 0305 03/02/17 0419 03/03/17 0421 03/06/17 0456  WBC 3.8 4.2 2.8* 5.8 4.4  HGB 7.9* 8.4* 9.8* 9.2* 8.9*  HCT 23.7* 25.8* 29.6* 28.1* 26.8*  PLT 95* 101* 107* 118* 114*  MCV 90.1 90.7 89.4 89.7 88.5  MCH 30.0 29.6 29.5 29.4 29.2  MCHC 33.3 32.6 33.1 32.7 33.0  RDW 17.1*  17.6* 16.9* 17.1* 16.5*    ------------------------------------------------------------------------------------------------------------------  Chemistries   Recent Labs Lab 03/01/17 1432 03/02/17 1515 03/03/17 0421 03/04/17 0438 03/05/17 0944 03/06/17 0456  NA 140 139  --  141 141 139  K 4.5 5.1  --  5.8* 4.7 4.7  CL 111 107  --  109 108 108  CO2 22 22  --  27 25 26   GLUCOSE 176* 149*  --  123* 147* 131*  BUN 30* 30*  --  56* 61* 67*  CREATININE 2.59* 2.45* 2.64* 2.75* 2.23* 2.03*  CALCIUM 8.4* 8.7*  --  8.2* 8.1* 7.9*  AST 18  --   --   --   --   --   ALT <5*  --   --   --   --   --   ALKPHOS 94  --   --   --   --   --   BILITOT 0.5  --   --   --   --   --    ------------------------------------------------------------------------------------------------------------------ estimated creatinine clearance is 19.2 mL/min (A) (by C-G formula based on SCr of 2.03 mg/dL (H)). ------------------------------------------------------------------------------------------------------------------ No results for input(s): TSH, T4TOTAL, T3FREE, THYROIDAB in the last 72 hours.  Invalid input(s): FREET3  Cardiac Enzymes No results for input(s): CKMB, TROPONINI, MYOGLOBIN in the last 168 hours.  Invalid input(s): CK ------------------------------------------------------------------------------------------------------------------ Invalid input(s): POCBNP ---------------------------------------------------------------------------------------------------------------  RADIOLOGY: No results found.  EKG:  Orders placed or performed during the hospital encounter of 02/26/17  . ED EKG  . ED EKG    ASSESSMENT AND PLAN:  Active Problems:   Generalized weakness   Acute on chronic renal failure (HCC)   Anemia   Guaiac positive stools   Fall   Palliative care encounter   Goals of care, counseling/discussion   Expressive aphasia  #1. Acute on chronic renal failure, Underlying CKD, stage IV, Bladder residual was  only 10 cc, creatinine Has improved today on IV fluids,  nephrologist recommended to continue IV fluids, renal ultrasound revealed bilateral renal cysts. Urinalysis was unremarkable, unlikely UTI . CK levels was normal. Follow creatinine in the morning. . Palliative care consultation is appreciated, pending patient's progression.   #2. Elevated troponin,  due to demand ischemia, echocardiogram revealed normal ejection fraction, diastolic dysfunction, moderate tricuspid regurgitation, pulmonary hypertension, now off Lasix, no chest pains #3. Hyperkalemia, status post Kayexalate yesterday, improved  potassium level  #4. Generalized weakness, fall,  physical therapist saw patient in consultation and recommended 24-hour supervision/assistance, speech therapist evaluated patient, recommended dysphagia 3 diet with thin liquids, aspiration precautions , appreciate palliative care input, pending patient's progression, occupational therapist, physical therapist to reassess patient, discussed with social worker [place], facility is to reassess patient as well #5. Hemoccult positive stool, anemia, iron studies, ferritin revealed anemia of chronic disease, suspect multifactorial, chronic blood loss anemia and anemia of chronic disease due to renal failure, appreciate gastroenterologist input, no further procedures were recommended, but medical therapy,, continue Protonix , no active bleeding, hemoglobin remains stable #7. Altered mental status,  suspected medications, discontinue Remeron due to sedation. Continue Seroquel at lower dose at 12.5 daily at bedtime,  now off Flexeril, improved some  clinically,  , still very weak to participate in physical therapy,appreciate neurologist input, TSH, ammonia, B12 levels were normal, no further diagnostics were recommended by Dr. Thad Rangereynolds, MRI of the brain was unremarkable, electroencephalogram was normal.   Discussed with social worker [place] today again, disposition is unclear,  pending patient's progression, repeat physical therapist  evaluation, facility is to reassess patient's progression  #8. Acidosis, metabolic, BMP is unremarkable, bicarbonate level is 27 today, lactic acid level was normal, no further interventions  Management plans discussed with the patient, family and they are in agreement.   DRUG ALLERGIES:  Allergies  Allergen Reactions  . Penicillins Other (See Comments)    Reaction:  Unknown   . Plavix [Clopidogrel Bisulfate] Other (See Comments)    Reaction:  Unknown     CODE STATUS:     Code Status Orders        Start     Ordered   02/26/17 2326  Do not attempt resuscitation (DNR)  Continuous    Question Answer Comment  In the event of cardiac or respiratory ARREST Do not call a "code blue"   In the event of cardiac or respiratory ARREST Do not perform Intubation, CPR, defibrillation or ACLS   In the event of cardiac or respiratory ARREST Use medication by any route, position, wound care, and other measures to relive pain and suffering. May use oxygen, suction and manual treatment of airway obstruction as needed for comfort.      02/26/17 2325    Code Status History    Date Active Date Inactive Code Status Order ID Comments User Context   07/05/2016  5:47 PM 07/09/2016  7:51 PM Full Code [id number]  Marguarite Arbour, MD ED   07/03/2016 11:04 AM 07/03/2016  5:26 PM Full Code [id number]  Milagros Loll, MD ED   12/31/2015  5:42 PM 01/06/2016  7:12 PM Full Code [id number]  [name], MD Inpatient   09/13/2015 10:55 PM 09/15/2015  8:25 PM Full Code [id number]  Oralia Manis, MD Inpatient   08/28/2015  8:14 AM 08/29/2015  4:49 PM DNR [id number]  Delfino Lovett, MD Inpatient   08/27/2015 11:25 PM 08/28/2015  8:14 AM Full Code [id number]  Hower, Cletis Athens, MD ED    Advance Directive Documentation     Most Recent Value  Type of Advance Directive  Out of facility DNR (pink MOST or yellow form)  Pre-existing out of facility DNR order (yellow form or pink  MOST form)  -  "MOST" Form in Place?  -      TOTAL TIME TAKING CARE OF THIS PATIENT 35  minutes.  Discussed with Baldo Daub M.D on 03/06/2017 at 3:27 PM  Between 7am to 6pm - Pager - [phone]  After 6pm go to [email or web address] - password [email or web address] [facility]  [place] [facility]  Office  [phone]  CC: Primary care physician; Merlene Pulling, PA-C

## 2017-03-07 LAB — CREATININE, SERUM
CREATININE: 2.16 mg/dL — AB (ref 0.44–1.00)
GFR, EST AFRICAN AMERICAN: 23 mL/min — AB (ref >60)
GFR, EST NON AFRICAN AMERICAN: 20 mL/min — AB (ref >60)

## 2017-03-07 LAB — GLUCOSE, CAPILLARY: Glucose-Capillary: 92 mg/dL (ref 65–99)

## 2017-03-07 NOTE — Progress Notes (Signed)
[facility] [place], [place] 03/07/17  Subjective:  Patient resting comfortably in bed today. Creatinine slightly higher today at 2.1. Patient remains on IV fluids.    Objective:  Vital signs in last 24 hours:  Temp:  [97.9 F (36.6 C)-98.4 F (36.9 C)] 98.4 F (36.9 C) (05/19 1213) Pulse Rate:  [53-65] 53 (05/19 1214) Resp:  [16-18] 18 (05/19 1213) BP: (121-142)/(44-50) 138/47 (05/19 1214) SpO2:  [92 %-97 %] 97 % (05/19 1213)  Weight change:  Filed Weights   03/02/17 0500 03/04/17 0502 03/05/17 0500  Weight: 63.5 kg (140 lb) 63.5 kg (140 lb 1.6 oz) 63.5 kg (140 lb)    Intake/Output:    Intake/Output Summary (Last 24 hours) at 03/07/17 1316 Last data filed at 03/07/17 0958  Gross per 24 hour  Intake              565 ml  Output                0 ml  Net              565 ml     Physical Exam: General: NAD, laying in bed  HEENT Left facial ecchymosis, OM moist  Neck supple  Pulm/lungs Normal effort, CTAB  CVS/Heart S1S2 no rubs  Abdomen:  Soft, NTND  Extremities: Trace edema  Neurologic: Awake, alert, follows simple commands             Basic Metabolic Panel:   Recent Labs Lab 03/01/17 1432 03/02/17 1515 03/03/17 0421 03/04/17 0438 03/05/17 0944 03/06/17 0456 03/07/17 0424  NA 140 139  --  141 141 139  --   K 4.5 5.1  --  5.8* 4.7 4.7  --   CL 111 107  --  109 108 108  --   CO2 22 22  --  27 25 26   --   GLUCOSE 176* 149*  --  123* 147* 131*  --   BUN 30* 30*  --  56* 61* 67*  --   CREATININE 2.59* 2.45* 2.64* 2.75* 2.23* 2.03* 2.16*  CALCIUM 8.4* 8.7*  --  8.2* 8.1* 7.9*  --      CBC:  Recent Labs Lab 03/01/17 0305 03/02/17 0419 03/03/17 0421 03/06/17 0456  WBC 4.2 2.8* 5.8 4.4  HGB 8.4* 9.8* 9.2* 8.9*  HCT 25.8* 29.6* 28.1* 26.8*  MCV 90.7 89.4 89.7 88.5  PLT 101* 107* 118* 114*     No results found for: HEPBSAG, HEPBSAB, HEPBIGM    Microbiology:  Recent Results (from the past 240 hour(s))  MRSA PCR  Screening     Status: None   Collection Time: 03/01/17  5:05 PM  Result Value Ref Range Status   MRSA by PCR NEGATIVE NEGATIVE Final    Comment:        The GeneXpert MRSA Assay (FDA approved for NASAL specimens only), is one component of a comprehensive MRSA colonization surveillance program. It is not intended to diagnose MRSA infection nor to guide or monitor treatment for MRSA infections.     Coagulation Studies: No results for input(s): LABPROT, INR in the last 72 hours.  Urinalysis: No results for input(s): COLORURINE, LABSPEC, PHURINE, GLUCOSEU, HGBUR, BILIRUBINUR, KETONESUR, PROTEINUR, UROBILINOGEN, NITRITE, LEUKOCYTESUR in the last 72 hours.  Invalid input(s): APPERANCEUR    Imaging: No results found.   Medications:   . dextrose 5 % and 0.9% NaCl 50 mL/hr at 03/06/17 2253   . acidophilus  1 capsule Oral BID  .  amLODipine  10 mg Oral Daily  . atorvastatin  10 mg Oral QHS  . carbidopa-levodopa  1 tablet Oral TID  . diclofenac sodium  2 g Topical BID  . donepezil  10 mg Oral Daily  . escitalopram  10 mg Oral Daily  . feeding supplement (ENSURE ENLIVE)  237 mL Oral BID BM  . ferrous sulfate  325 mg Oral BID  . heparin subcutaneous  5,000 Units Subcutaneous Q8H  . levETIRAcetam  250 mg Oral Daily  . multivitamin-lutein   Oral Daily  . mupirocin cream   Topical Daily  . pantoprazole  40 mg Oral Daily  . QUEtiapine  12.5 mg Oral QHS  . vitamin B-12  1,000 mcg Oral Daily   acetaminophen **OR** acetaminophen, ipratropium-albuterol, ondansetron **OR** ondansetron (ZOFRAN) IV  Assessment/ Plan:  83 y.o.caucsian female with medical problems of Parkinson's disease, dementia,stroke, depression, pulmonary hypertension, who was admitted to [facility] on 02/26/2017 for evaluation of mental status changes status post fall.    1. Acute renal failure. Unclear cause but likely from hemodynamic instability. - It appears that creatinine has reached a plateau.  Creatinine  currently 2.1. Recommend continue monitoring while she is here. Continue IV fluids as above.  2. Chronic kidney disease stage IV - baseline creatinine 1.82/GFR 25 Underlying CKD is likely secondary to atherosclerosis -  Creatinine still remains slightly above baseline as above. We will continue to monitor renal function while the patient is here.  3.  Anemia of CKD:  Hemoglobin yesterday was 8.9. Recommend continued monitoring. May need Epogen if hemoglobin continues to drop.  4. Hyperkalemia.  Now resolved.  LOS: 9 Caitlain Tweed 5/19/20181:16 PM  [address] [place], [place] [phone]

## 2017-03-07 NOTE — Progress Notes (Signed)
Pnt had uneventful evening this shift. No issues or concerns. Pnt c/o left leg pain with SCDS as they were removed. Pnt has been bathed and slept this shift. Vs's stable. Bed low, locked and call bell in reach. PIV site benign and MIVF infusing without incident. Will continue to monitor and assess.

## 2017-03-07 NOTE — Progress Notes (Signed)
[facility] Physicians - [place] at [facility]   PATIENT NAME: Stacey Baker    MR#:  [id number]  DATE OF BIRTH:  02/25/1932  SUBJECTIVE:  CHIEF COMPLAINT:   Chief Complaint  Patient presents with  . Urinary Frequency   Patient is 83 year old Caucasian female with past medical history significant for history of CAD stage III, pericardial, pleural effusions, chronic diastolic CHF, seizures, Parkinson's disease, memory loss, aphasia, who presents to the hospital withConfusion. Patient had acute on chronic renal failure. Started on IV hydration. Patient is alert and oriented today, denies any complaints, finished her breakfast.   Review of Systems  Unable to perform ROS: Language  Constitutional: Negative for chills and fever.  HENT: Negative for hearing loss.   Eyes: Negative for blurred vision, double vision and photophobia.  Respiratory: Negative for cough, hemoptysis and shortness of breath.   Cardiovascular: Negative for palpitations, orthopnea and leg swelling.  Gastrointestinal: Negative for abdominal pain, diarrhea and vomiting.  Genitourinary: Negative for dysuria and urgency.  Musculoskeletal: Negative for myalgias and neck pain.  Skin: Negative for rash.  Neurological: Negative for dizziness, focal weakness, seizures, weakness and headaches.  Psychiatric/Behavioral: Negative for memory loss. The patient does not have insomnia.   excessive aphasia, expressive aphasia  VITAL SIGNS: Blood pressure (!) 142/50, pulse (!) 55, temperature 97.9 F (36.6 C), temperature source Oral, resp. rate 16, height 5\' 3"  (1.6 m), weight 63.5 kg (140 lb), SpO2 95 %.  PHYSICAL EXAMINATION:   GENERAL:  83 y.o.-year-old patient lying in the bed with no acute distress, alert alert today  EYES: Pupils equal, round, reactive to light and accommodation. No scleral icterus. Extraocular muscles intact.  HEENT: Head atraumatic, normocephalic. Oropharynx and nasopharynx clear. Bruising  of her left periorbital area NECK:  Supple, no jugular venous distention. No thyroid enlargement, no tenderness.  LUNGS:  Better air entrance overall, increased effort,  few scattered rales,rhonchi , but no crepitations noted , no use of accessory muscles of respiration.  CARDIOVASCULAR: S1, S2 normal. No murmurs, rubs, or gallops.  ABDOMEN: Soft, nontender, nondistended. Bowel sounds present. No organomegaly or mass.  EXTREMITIES: No pedal edema, cyanosis, or clubbing.  NEUROLOGIC: Cranial nerves II through XII are intact. Muscle strength 5/5 in all extremities. Sensation intact. Gait not checked.  PSYCHIATRIC: The patient is alert,  He will times a few questions appropriately  Skin no obvious lesion, or ulcer.   ORDERS/RESULTS REVIEWED:   CBC  Recent Labs Lab 03/01/17 0305 03/02/17 0419 03/03/17 0421 03/06/17 0456  WBC 4.2 2.8* 5.8 4.4  HGB 8.4* 9.8* 9.2* 8.9*  HCT 25.8* 29.6* 28.1* 26.8*  PLT 101* 107* 118* 114*  MCV 90.7 89.4 89.7 88.5  MCH 29.6 29.5 29.4 29.2  MCHC 32.6 33.1 32.7 33.0  RDW 17.6* 16.9* 17.1* 16.5*   ------------------------------------------------------------------------------------------------------------------  Chemistries   Recent Labs Lab 03/01/17 1432 03/02/17 1515 03/03/17 0421 03/04/17 0438 03/05/17 0944 03/06/17 0456 03/07/17 0424  NA 140 139  --  141 141 139  --   K 4.5 5.1  --  5.8* 4.7 4.7  --   CL 111 107  --  109 108 108  --   CO2 22 22  --  27 25 26   --   GLUCOSE 176* 149*  --  123* 147* 131*  --   BUN 30* 30*  --  56* 61* 67*  --   CREATININE 2.59* 2.45* 2.64* 2.75* 2.23* 2.03* 2.16*  CALCIUM 8.4* 8.7*  --  8.2* 8.1*  7.9*  --   AST 18  --   --   --   --   --   --   ALT <5*  --   --   --   --   --   --   ALKPHOS 94  --   --   --   --   --   --   BILITOT 0.5  --   --   --   --   --   --    ------------------------------------------------------------------------------------------------------------------ estimated creatinine  clearance is 18 mL/min (A) (by C-G formula based on SCr of 2.16 mg/dL (H)). ------------------------------------------------------------------------------------------------------------------ No results for input(s): TSH, T4TOTAL, T3FREE, THYROIDAB in the last 72 hours.  Invalid input(s): FREET3  Cardiac Enzymes No results for input(s): CKMB, TROPONINI, MYOGLOBIN in the last 168 hours.  Invalid input(s): CK ------------------------------------------------------------------------------------------------------------------ Invalid input(s): POCBNP ---------------------------------------------------------------------------------------------------------------  RADIOLOGY: No results found.  EKG:  Orders placed or performed during the hospital encounter of 02/26/17  . ED EKG  . ED EKG    ASSESSMENT AND PLAN:  Active Problems:   Generalized weakness   Acute on chronic renal failure (HCC)   Anemia   Guaiac positive stools   Fall   Palliative care encounter   Goals of care, counseling/discussion   Expressive aphasia  #1. Acute on chronic renal failure, Underlying CKD, stage IV, kidney function is improving, followed in nephrology, continue IV hydration, creatinine is down from 2.75-2.03.     Urinalysis was unremarkable, unlikely UTI . CK levels was normal.  . Palliative care consultation is appreciated,       #2. Elevated troponin,  due to demand ischemia, echocardiogram revealed normal ejection fraction, diastolic dysfunction, moderate tricuspid regurgitation, pulmonary hypertension, now off Lasix, no chest pains    #3. Hyperkalemia, status post Kayexalate yesterday, improved  potassium level   #4. Generalized weakness, fall,  physical therapist saw patient in consultation and recommended 24-hour supervision/assistance, speech therapist evaluated patient, recommended dysphagia 3 diet with thin liquids, aspiration precautions , appreciate palliative care input,  #5. Hemoccult  positive stool, anemia, iron studies, ferritin revealed anemia of chronic disease, suspect multifactorial, chronic blood loss anemia and anemia of chronic disease due to renal failure, appreciate gastroenterologist input, no further procedures were recommended, but medical therapy,, continue Protonix , no active bleeding, hemoglobin remains stable  #7. Altered mental status,  suspected medications, discontinue Remeron due to sedation. Continue Seroquel at lower dose at 12.5 daily at bedtime,  now off Flexeril, improved some  clinically,  , still very weak to participate in physical therapy,appreciate neurologist input, TSH, ammonia, B12 levels were normal, no further diagnostics were recommended by Dr. Thad Rangereynolds, MRI of the brain was unremarkable, electroencephalogram was normal.   Discussed with social worker [place]ine [place] again, disposition is unclear, pending patient's progression, repeat physical therapist evaluation, facility is to reassess patient's progression   #8. Acidosis, metabolic, BMP is unremarkable, bicarbonate level is 27 , lactic acid level was normal, no further interventions  #9 .hypertension;stable #10. hyperlipidemia #11. Dementia and depression: Patient is on Aricept, Lexapro, Keppra, for Parkinson disease she is on Sinemet.  Management plans discussed with the patient, family and they are in agreement.   DRUG ALLERGIES:  Allergies  Allergen Reactions  . Penicillins Other (See Comments)    Reaction:  Unknown   . Plavix [Clopidogrel Bisulfate] Other (See Comments)    Reaction:  Unknown     CODE STATUS:     Code Status Orders  Start     Ordered   02/26/17 2326  Do not attempt resuscitation (DNR)  Continuous    Question Answer Comment  In the event of cardiac or respiratory ARREST Do not call a "code blue"   In the event of cardiac or respiratory ARREST Do not perform Intubation, CPR, defibrillation or ACLS   In the event of cardiac or respiratory ARREST Use  medication by any route, position, wound care, and other measures to relive pain and suffering. May use oxygen, suction and manual treatment of airway obstruction as needed for comfort.      02/26/17 2325    Code Status History    Date Active Date Inactive Code Status Order ID Comments User Context   07/05/2016  5:47 PM 07/09/2016  7:51 PM Full Code [id number]  Marguarite Arbour, MD ED   07/03/2016 11:04 AM 07/03/2016  5:26 PM Full Code [id number]  Milagros Loll, MD ED   12/31/2015  5:42 PM 01/06/2016  7:12 PM Full Code [id number]  [name], MD Inpatient   09/13/2015 10:55 PM 09/15/2015  8:25 PM Full Code [id number]  Oralia Manis, MD Inpatient   08/28/2015  8:14 AM 08/29/2015  4:49 PM DNR [id number]  Delfino Lovett, MD Inpatient   08/27/2015 11:25 PM 08/28/2015  8:14 AM Full Code [id number]  Hower, Cletis Athens, MD ED    Advance Directive Documentation     Most Recent Value  Type of Advance Directive  Out of facility DNR (pink MOST or yellow form)  Pre-existing out of facility DNR order (yellow form or pink MOST form)  -  "MOST" Form in Place?  -      TOTAL TIME TAKING CARE OF THIS PATIENT 35  minutes.  Discussed with Otilio Carpen M.D on 03/07/2017 at 9:33 AM  Between 7am to 6pm - Pager - [phone]  After 6pm go to [email or web address] - password [email or web address] [facility]  [place] [facility]  Office  [phone]  CC: Primary care physician; Merlene Pulling, PA-C

## 2017-03-07 NOTE — Progress Notes (Signed)
Dressing changed to 2 right fingers per orders. Pnt tol well.

## 2017-03-08 LAB — GLUCOSE, CAPILLARY: Glucose-Capillary: 77 mg/dL (ref 65–99)

## 2017-03-08 NOTE — Progress Notes (Signed)
[facility] - [place] at [facility]   PATIENT NAME: Stacey Baker    MR#:  [id number]  DATE OF BIRTH:  04-05-1932  SUBJECTIVE:  CHIEF COMPLAINT:   Chief Complaint  Patient presents with  . Urinary Frequency   Patient is 83 year old Caucasian female with past medical history significant for history of CAD stage III, pericardial, pleural effusions, chronic diastolic CHF, seizures, Parkinson's disease, memory loss, aphasia, who presents to the hospital withConfusion. Patient had acute on chronic renal failure. Started on IV hydration. Patient is alert and oriented today, denies any complaints, finished her breakfast.   Review of Systems  Unable to perform ROS: Language  Constitutional: Negative for chills and fever.  HENT: Negative for hearing loss.   Eyes: Negative for blurred vision, double vision and photophobia.  Respiratory: Negative for cough, hemoptysis and shortness of breath.   Cardiovascular: Negative for palpitations, orthopnea and leg swelling.  Gastrointestinal: Negative for abdominal pain, diarrhea and vomiting.  Genitourinary: Negative for dysuria and urgency.  Musculoskeletal: Negative for myalgias and neck pain.  Skin: Negative for rash.  Neurological: Negative for dizziness, focal weakness, seizures, weakness and headaches.  Psychiatric/Behavioral: Negative for memory loss. The patient does not have insomnia.   excessive aphasia, expressive aphasia  VITAL SIGNS: Blood pressure (!) 151/54, pulse (!) 49, temperature 98.2 F (36.8 C), temperature source Oral, resp. rate 16, height 5\' 3"  (1.6 m), weight 66.6 kg (146 lb 12.8 oz), SpO2 95 %.  PHYSICAL EXAMINATION:   GENERAL:  83 y.o.-year-old patient lying in the bed with no acute distress, alert alert today  EYES: Pupils equal, round, reactive to light and accommodation. No scleral icterus. Extraocular muscles intact.  HEENT: Head atraumatic, normocephalic. Oropharynx and nasopharynx clear.  Bruising of her left periorbital area NECK:  Supple, no jugular venous distention. No thyroid enlargement, no tenderness.  LUNGS:  Better air entrance overall, increased effort,  few scattered rales,rhonchi , but no crepitations noted , no use of accessory muscles of respiration.  CARDIOVASCULAR: S1, S2 normal. No murmurs, rubs, or gallops.  ABDOMEN: Soft, nontender, nondistended. Bowel sounds present. No organomegaly or mass.  EXTREMITIES: No pedal edema, cyanosis, or clubbing.  NEUROLOGIC: Cranial nerves II through XII are intact. Muscle strength 5/5 in all extremities. Sensation intact. Gait not checked.  PSYCHIATRIC: The patient is alert,  He will times a few questions appropriately  Skin no obvious lesion, or ulcer.   ORDERS/RESULTS REVIEWED:   CBC  Recent Labs Lab 03/02/17 0419 03/03/17 0421 03/06/17 0456  WBC 2.8* 5.8 4.4  HGB 9.8* 9.2* 8.9*  HCT 29.6* 28.1* 26.8*  PLT 107* 118* 114*  MCV 89.4 89.7 88.5  MCH 29.5 29.4 29.2  MCHC 33.1 32.7 33.0  RDW 16.9* 17.1* 16.5*   ------------------------------------------------------------------------------------------------------------------  Chemistries   Recent Labs Lab 03/01/17 1432 03/02/17 1515 03/03/17 0421 03/04/17 0438 03/05/17 0944 03/06/17 0456 03/07/17 0424  NA 140 139  --  141 141 139  --   K 4.5 5.1  --  5.8* 4.7 4.7  --   CL 111 107  --  109 108 108  --   CO2 22 22  --  27 25 26   --   GLUCOSE 176* 149*  --  123* 147* 131*  --   BUN 30* 30*  --  56* 61* 67*  --   CREATININE 2.59* 2.45* 2.64* 2.75* 2.23* 2.03* 2.16*  CALCIUM 8.4* 8.7*  --  8.2* 8.1* 7.9*  --   AST 18  --   --   --   --   --   --  ALT <5*  --   --   --   --   --   --   ALKPHOS 94  --   --   --   --   --   --   BILITOT 0.5  --   --   --   --   --   --    ------------------------------------------------------------------------------------------------------------------ estimated creatinine clearance is 18.4 mL/min (A) (by C-G formula based  on SCr of 2.16 mg/dL (H)). ------------------------------------------------------------------------------------------------------------------ No results for input(s): TSH, T4TOTAL, T3FREE, THYROIDAB in the last 72 hours.  Invalid input(s): FREET3  Cardiac Enzymes No results for input(s): CKMB, TROPONINI, MYOGLOBIN in the last 168 hours.  Invalid input(s): CK ------------------------------------------------------------------------------------------------------------------ Invalid input(s): POCBNP ---------------------------------------------------------------------------------------------------------------  RADIOLOGY: No results found.  EKG:  Orders placed or performed during the hospital encounter of 02/26/17  . ED EKG  . ED EKG    ASSESSMENT AND PLAN:  Active Problems:   Generalized weakness   Acute on chronic renal failure (HCC)   Anemia   Guaiac positive stools   Fall   Palliative care encounter   Goals of care, counseling/discussion   Expressive aphasia  #1. Acute on chronic renal failure, ;Underlying CKD, stage IV, kidney function is improving, followed in nephrology,  Renal function is improving.   Urinalysis was unremarkable, unlikely UTI . CK levels was normal.  . Palliative care consultation is appreciated,     #2. Elevated troponin,  due to demand ischemia, echocardiogram revealed normal ejection fraction, diastolic dysfunction, moderate tricuspid regurgitation, pulmonary hypertension, now off Lasix, no chest pains.    #3. Hyperkalemia, status post Kayexalate yesterday, improved  potassium level   #4. Generalized weakness, fall,  physical therapist saw patient in consultation and recommended 24-hour supervision/assistance, speech therapist evaluated patient, recommended dysphagia 3 diet with thin liquids, aspiration precautions , appreciate palliative care input,  #5. Hemoccult positive stool, anemia, iron studies, ferritin revealed anemia of chronic disease,  suspect multifactorial, chronic blood loss anemia and anemia of chronic disease due to renal failure, appreciate gastroenterologist input, no further procedures were recommended, but medical therapy,, continue Protonix , no active bleeding, hemoglobin remains stable  #7. Altered mental status,  suspected medications, discontinued Remeron due to sedation. Continue Seroquel at lower dose at 12.5 daily at bedtime,  now off Flexeril, improved some  clinically,  , still very weak to participate in physical therapy,appreciate neurologist input, TSH, ammonia, B12 levels were normal, no further diagnostics were recommended by Dr. Thad Rangereynolds, MRI of the brain was unremarkable, electroencephalogram was normal.   Discussed with social worker Maxine GlennMonica again, disposition is unclear, pending patient's progression, repeat physical therapist evaluation, facility is to reassess patient's progression   #8. Acidosis, metabolic, BMP is unremarkable, bicarbonate level is 27 , lactic acid level was normal, no further interventions  #9 .hypertension;stable #10. hyperlipidemia #11. Dementia and depression: Patient is on Aricept, Lexapro, Keppra, for Parkinson disease she is on Sinemet.  Management plans discussed with the patient, family and they are in agreement.   DRUG ALLERGIES:  Allergies  Allergen Reactions  . Penicillins Other (See Comments)    Reaction:  Unknown   . Plavix [Clopidogrel Bisulfate] Other (See Comments)    Reaction:  Unknown     CODE STATUS:     Code Status Orders        Start     Ordered   02/26/17 2326  Do not attempt resuscitation (DNR)  Continuous    Question Answer Comment  In the event of cardiac  or respiratory ARREST Do not call a "code blue"   In the event of cardiac or respiratory ARREST Do not perform Intubation, CPR, defibrillation or ACLS   In the event of cardiac or respiratory ARREST Use medication by any route, position, wound care, and other measures to relive pain and  suffering. May use oxygen, suction and manual treatment of airway obstruction as needed for comfort.      02/26/17 2325    Code Status History    Date Active Date Inactive Code Status Order ID Comments User Context   07/05/2016  5:47 PM 07/09/2016  7:51 PM Full Code [id number]  Marguarite Arbour, MD ED   07/03/2016 11:04 AM 07/03/2016  5:26 PM Full Code [id number]  Milagros Loll, MD ED   12/31/2015  5:42 PM 01/06/2016  7:12 PM Full Code [id number]  [name], MD Inpatient   09/13/2015 10:55 PM 09/15/2015  8:25 PM Full Code [id number]  Oralia Manis, MD Inpatient   08/28/2015  8:14 AM 08/29/2015  4:49 PM DNR [id number]  Delfino Lovett, MD Inpatient   08/27/2015 11:25 PM 08/28/2015  8:14 AM Full Code [id number]  Hower, Cletis Athens, MD ED    Advance Directive Documentation     Most Recent Value  Type of Advance Directive  Out of facility DNR (pink MOST or yellow form)  Pre-existing out of facility DNR order (yellow form or pink MOST form)  -  "MOST" Form in Place?  -      TOTAL TIME TAKING CARE OF THIS PATIENT 35  minutes.  Discussed with Otilio Carpen M.D on 03/08/2017 at 10:39 AM  Between 7am to 6pm - Pager - [phone]  After 6pm go to [email or web address] - password [email or web address] [facility]  [facility]  Office  [phone]  CC: Primary care physician; Merlene Pulling, PA-C

## 2017-03-08 NOTE — Progress Notes (Signed)
[facility], [place] 03/08/17  Subjective:  Patient resting comfortably in bed. She is awake and alert this a.m. No new renal function testing today. She had 4 urine occurrences yesterday.  Objective:  Vital signs in last 24 hours:  Temp:  [98.2 F (36.8 C)-98.5 F (36.9 C)] 98.2 F (36.8 C) (05/20 0436) Pulse Rate:  [49-56] 49 (05/20 0436) Resp:  [16-18] 16 (05/20 0436) BP: (137-151)/(47-54) 151/54 (05/20 0436) SpO2:  [95 %-97 %] 95 % (05/20 0436) Weight:  [66.6 kg (146 lb 12.8 oz)] 66.6 kg (146 lb 12.8 oz) (05/20 0436)  Weight change:  Filed Weights   03/04/17 0502 03/05/17 0500 03/08/17 0436  Weight: 63.5 kg (140 lb 1.6 oz) 63.5 kg (140 lb) 66.6 kg (146 lb 12.8 oz)    Intake/Output:    Intake/Output Summary (Last 24 hours) at 03/08/17 1113 Last data filed at 03/08/17 1022  Gross per 24 hour  Intake          2443.01 ml  Output                0 ml  Net          2443.01 ml     Physical Exam: General: NAD, laying in bed  HEENT Left facial ecchymosis, OM moist  Neck supple  Pulm/lungs Normal effort, CTAB  CVS/Heart S1S2 no rubs  Abdomen:  Soft, NTND  Extremities: Trace edema  Neurologic: Awake, alert, follows simple commands             Basic Metabolic Panel:   Recent Labs Lab 03/01/17 1432 03/02/17 1515 03/03/17 0421 03/04/17 0438 03/05/17 0944 03/06/17 0456 03/07/17 0424  NA 140 139  --  141 141 139  --   K 4.5 5.1  --  5.8* 4.7 4.7  --   CL 111 107  --  109 108 108  --   CO2 22 22  --  27 25 26   --   GLUCOSE 176* 149*  --  123* 147* 131*  --   BUN 30* 30*  --  56* 61* 67*  --   CREATININE 2.59* 2.45* 2.64* 2.75* 2.23* 2.03* 2.16*  CALCIUM 8.4* 8.7*  --  8.2* 8.1* 7.9*  --      CBC:  Recent Labs Lab 03/02/17 0419 03/03/17 0421 03/06/17 0456  WBC 2.8* 5.8 4.4  HGB 9.8* 9.2* 8.9*  HCT 29.6* 28.1* 26.8*  MCV 89.4 89.7 88.5  PLT 107* 118* 114*     No results found for: HEPBSAG, HEPBSAB,  HEPBIGM    Microbiology:  Recent Results (from the past 240 hour(s))  MRSA PCR Screening     Status: None   Collection Time: 03/01/17  5:05 PM  Result Value Ref Range Status   MRSA by PCR NEGATIVE NEGATIVE Final    Comment:        The GeneXpert MRSA Assay (FDA approved for NASAL specimens only), is one component of a comprehensive MRSA colonization surveillance program. It is not intended to diagnose MRSA infection nor to guide or monitor treatment for MRSA infections.     Coagulation Studies: No results for input(s): LABPROT, INR in the last 72 hours.  Urinalysis: No results for input(s): COLORURINE, LABSPEC, PHURINE, GLUCOSEU, HGBUR, BILIRUBINUR, KETONESUR, PROTEINUR, UROBILINOGEN, NITRITE, LEUKOCYTESUR in the last 72 hours.  Invalid input(s): APPERANCEUR    Imaging: No results found.   Medications:    . acidophilus  1 capsule Oral BID  . amLODipine  10 mg Oral  Daily  . atorvastatin  10 mg Oral QHS  . carbidopa-levodopa  1 tablet Oral TID  . diclofenac sodium  2 g Topical BID  . donepezil  10 mg Oral Daily  . escitalopram  10 mg Oral Daily  . feeding supplement (ENSURE ENLIVE)  237 mL Oral BID BM  . ferrous sulfate  325 mg Oral BID  . heparin subcutaneous  5,000 Units Subcutaneous Q8H  . levETIRAcetam  250 mg Oral Daily  . multivitamin-lutein   Oral Daily  . mupirocin cream   Topical Daily  . pantoprazole  40 mg Oral Daily  . QUEtiapine  12.5 mg Oral QHS  . vitamin B-12  1,000 mcg Oral Daily   acetaminophen **OR** acetaminophen, ipratropium-albuterol, ondansetron **OR** ondansetron (ZOFRAN) IV  Assessment/ Plan:  83 y.o.caucsian female with medical problems of Parkinson's disease, dementia,stroke, depression, pulmonary hypertension, who was [facility]2017 for evaluation of mental status changes status post fall.    1. Acute renal failure. Unclear cause but likely from hemodynamic instability.  Renal US negative for hydronephrosis - No  new renal function testing today. Continue to periodically monitor renal function. She will need close monitoring of renal function as an outpatient as well.  2. Chronic kidney disease stage IV - baseline creatinine 1.82/GFR 25 Underlying CKD is likely secondary to atherosclerosis -  As above continue to periodically monitor serum creatinine.  3.  Anemia of CKD:  Hemoglobin was last checked on 03/06/17 at which point in time hemoglobin was 8.9. We will need to monitor this as an outpatient and consider therapy with Epogen.  4. Hyperkalemia.  Now resolved.   LOS: 10 Robbyn Hodkinson 5/20/201811:13 AM  [address] [facility] [place], [place] [phone]

## 2017-03-09 ENCOUNTER — Encounter: Payer: Self-pay | Admitting: Internal Medicine

## 2017-03-09 LAB — GLUCOSE, CAPILLARY: Glucose-Capillary: 76 mg/dL (ref 65–99)

## 2017-03-09 LAB — BASIC METABOLIC PANEL
Anion gap: 6 (ref 5–15)
BUN: 65 mg/dL — AB (ref 6–20)
CHLORIDE: 110 mmol/L (ref 101–111)
CO2: 24 mmol/L (ref 22–32)
Calcium: 8 mg/dL — ABNORMAL LOW (ref 8.9–10.3)
Creatinine, Ser: 1.85 mg/dL — ABNORMAL HIGH (ref 0.44–1.00)
GFR calc Af Amer: 28 mL/min — ABNORMAL LOW (ref >60)
GFR calc non Af Amer: 24 mL/min — ABNORMAL LOW (ref >60)
Glucose, Bld: 87 mg/dL (ref 65–99)
Potassium: 4.5 mmol/L (ref 3.5–5.1)
Sodium: 140 mmol/L (ref 135–145)

## 2017-03-09 MED ORDER — ENSURE ENLIVE PO LIQD: 237.0000 mL | Freq: Two times a day (BID) | ORAL | 12 refills | Status: DC

## 2017-03-09 MED ORDER — ENSURE ENLIVE PO LIQD: 237.0000 mL | Freq: Two times a day (BID) | ORAL | 0 refills | Status: DC

## 2017-03-09 NOTE — Plan of Care (Signed)
Problem: Education: Goal: Knowledge of [facility] Education information/materials will improve Outcome: Progressing POC reviewed with pt, [facility].   Problem: Safety: Goal: Ability to remain free from injury will improve Outcome: Progressing Pt free from falls, bed alarm set. Call bell within reach, will cont to monitor.   Problem: Pain Managment: Goal: General experience of comfort will improve Outcome: Progressing Pt denies.  Problem: Physical Regulation: Goal: Ability to maintain clinical measurements within normal limits will improve Outcome: Progressing Pt in bed overnight, uta.   Problem: Skin Integrity: Goal: Risk for impaired skin integrity will decrease Outcome: Progressing Wounds dressed per md order.   Problem: Nutrition: Goal: Adequate nutrition will be maintained Outcome: Progressing Pt is a feeder.  Problem: Bowel/Gastric: Goal: Will not experience complications related to bowel motility Outcome: Progressing MD today 5/20.

## 2017-03-09 NOTE — Progress Notes (Signed)
New referral for [facility] of Stacey Baker services at [place]  received from [facility] following a Palliative Medicine consult. Stacey Baker is a 83 year old woman with a  past medical history of CVA with residual apraxia, Parkinsons, dementia, CKD 3, CHF-with diastolic dysfunction, and pleural effusion who was admitted on 02/26/2017 with a fall, acute renal failure as well as a drop in Hgb and heme positive stool. Palliative Medicine was consulted for goals of care and met with patient's son Stacey Baker and his wife. They have chosen to focus on comfort and wish for Stacey Baker to received hospice services at [place]. Patient seen sitting up in bed alert and able to converse and answer questions. She knew where she lived and who her doctor was. Staff aide present and about to feed her lunch. Patient was pleased about her discharge and looking forward to being back "home". Writer contacted her son Stacey Baker via telephone and left a message for a call back to initiate education regarding hospice services. Plan is for discharge back to [facility] via EMS today. Signed DNR in place in discharge packet. Patient information faxed to referral. Thank you.  Flo Shanks RN, BSN, [facility] [place], hospital liaison [phone] c

## 2017-03-09 NOTE — Discharge Summary (Signed)
[employer] - [place] [facility]   PATIENT NAME: Stacey Baker    MR#:  [id number]  DATE OF BIRTH:  [birth year]  DATE OF ADMISSION:  02/26/2017 ADMITTING PHYSICIAN: Katharina Caper, MD  DATE OF DISCHARGE: 03/09/2017  PRIMARY CARE PHYSICIAN: Merlene Pulling, PA-C   ADMISSION DIAGNOSIS:  Ecchymosis [R58] AKI (acute kidney injury) (HCC) [N17.9] Anemia, unspecified type [D64.9]  DISCHARGE DIAGNOSIS:  Active Problems:   Generalized weakness   Acute on chronic renal failure (HCC)   Anemia   Guaiac positive stools   Fall   Palliative care encounter   Goals of care, counseling/discussion   Expressive aphasia  SECONDARY DIAGNOSIS:   Past Medical History:  Diagnosis Date  . Apraxia following cerebral infarction   . CKD (chronic kidney disease), stage III    a. With acute worsening/AKI noted on several admissions.  . Dementia   . Dementia without behavioral disturbance   . Depression   . Encephalopathy   . Hyperkalemia    a. 08/2015  . Hyperlipidemia   . Hyperlipidemia   . Hypertension   . Iron deficiency anemia   . Major depressive disorder   . Muscle weakness   . Parkinson disease (HCC)   . Parkinson's disease (HCC)   . Pericardial effusion    a. 06/2016 Echo: Ef 60-65%, no rwma, Gr1 DD, mild MR, mildly dil LA, PASP , mod circumferential pericardial effusion - no hemodynamic compromise.  . Sinus bradycardia    a. 08/2015 in setting of beta blocker and hyperkalemia.  . Stroke (HCC)   . TIA (transient ischemic attack)    a. 08/2015 - essentially neg w/u for stroke.  Marland Kitchen UTI (urinary tract infection)      ADMITTING HISTORY  HISTORY OF PRESENT ILLNESS: Stacey Baker  is a 83 y.o. female with a known history of CAD stage III, peripheral effusion, pleural effusion, chronic diastolic CHF, seizures, Parkinson's disease, memory loss, aphasia, who presents to the hospital with confusion. Patient fell down earlier today and was seen by emergency room  physician, who discharged her to the facility after evaluation. She comes back due to worsening confusion. Her labs revealed worsening kidney failure with creatinine level up to 3.17 from 1.8 baseline, she was also noted to be more anemic with hemoglobin level drifting down to 8.1 from 10.5 in April 2018. Potassium was found to be elevated. Hemoccult of stool was minimally. Hospitalist services were contacted for admission guaiac-positive.  HOSPITAL COURSE:   # Acute on chronic renal failure, ;Underlying CKD, stage IV, kidney function is back to her baseline creatinine of 1.85 Was likely due to dehydration Nephrology f/u after discharge   # Elevated troponin,  due to demand ischemia, echocardiogram revealed normal ejection fraction, diastolic dysfunction, moderate tricuspid regurgitation, pulmonary hypertension, now off Lasix, no chest pains.  # Hyperkalemia, status post Kayexalate Resolved  # Generalized weakness, fall,  physical therapist saw patient in consultation and recommended 24-hour supervision/assistance, speech therapist evaluated patient, recommended dysphagia 3 diet with thin liquids, aspiration precautions Appreciate palliative care input Hospice services to follow after discharge  # Hemoccult positive stool, anemia, iron studies, ferritin revealed anemia of chronic disease, suspect multifactorial, chronic blood loss anemia and anemia of chronic disease due to renal failure, appreciate gastroenterologist input, no further procedures were recommended, but medical therapy,, continue Protonix , no active bleeding, hemoglobin remains stable  # Altered mental status,  suspected medications, discontinued Remeron due to sedation. Continue Seroquel  at bedtime still very weak to participate  in physical therapy,appreciate neurologist input, TSH, ammonia, B12 levels were normal, no further diagnostics were recommended by Dr. Thad Ranger MRI of the brain was unremarkable,  electroencephalogram was normal.  # hypertension;stable  # Dementia and depression Patient is on Aricept, Lexapro, Keppra, for Parkinson disease she is on Sinemet.  # Anemia of chronic disease Stable  Stabel for discharge back to [facility] with hospice services  CONSULTS OBTAINED:  Treatment Team:  Mosetta Pigeon, MD Tressia Danas, MD Katha Hamming, MD Thana Farr, MD  DRUG ALLERGIES:   Allergies  Allergen Reactions  . Penicillins Other (See Comments)    Reaction:  Unknown   . Plavix [Clopidogrel Bisulfate] Other (See Comments)    Reaction:  Unknown     DISCHARGE MEDICATIONS:   Current Discharge Medication List    START taking these medications   Details  feeding supplement, ENSURE ENLIVE, (ENSURE ENLIVE) LIQD Take 237 mLs by mouth 2 (two) times daily between meals. Qty: 237 mL, Refills: 12      CONTINUE these medications which have NOT CHANGED   Details  acetaminophen (TYLENOL) 325 MG tablet Take 650 mg by mouth every 6 (six) hours as needed.    acidophilus (RISAQUAD) CAPS capsule Take 1 capsule by mouth 2 (two) times daily.    amLODipine (NORVASC) 10 MG tablet Take 1 tablet (10 mg total) by mouth daily. Qty: 30 tablet, Refills: 5    aspirin EC 325 MG tablet Take 1 tablet (325 mg total) by mouth daily. Qty: 30 tablet, Refills: 3    atorvastatin (LIPITOR) 10 MG tablet Take 10 mg by mouth at bedtime.    carbidopa-levodopa (SINEMET IR) 25-100 MG tablet Take 1 tablet by mouth 3 (three) times daily.    diclofenac sodium (VOLTAREN) 1 % GEL Apply 2 g topically 2 (two) times daily. Pt applies to left knee.    donepezil (ARICEPT) 10 MG tablet Take 10 mg by mouth daily.    escitalopram (LEXAPRO) 10 MG tablet Take 10 mg by mouth daily.    ferrous sulfate 325 (65 FE) MG tablet Take 325 mg by mouth 2 (two) times daily.    levETIRAcetam (KEPPRA) 250 MG tablet Take 250 mg by mouth daily.     miconazole (BAZA ANTIFUNGAL) 2 % cream Apply 1  application topically 2 (two) times daily as needed. To sacrum every shift for redness after each toilet    Multiple Vitamins-Minerals (PRESERVISION AREDS 2 PO) Take 2 capsules by mouth daily.    QUEtiapine (SEROQUEL) 25 MG tablet Take 25 mg by mouth at bedtime.    Soft Lens Products (EQ SALINE SOLUTION/SENSITIVE) SOLN Apply 1 application topically every other day. Apply to neck topically once every other day for wound until healed.    vitamin B-12 (CYANOCOBALAMIN) 1000 MCG tablet Take 1,000 mcg by mouth daily.      STOP taking these medications     cyclobenzaprine (FLEXERIL) 10 MG tablet      mirtazapine (REMERON) 15 MG tablet      sulfamethoxazole-trimethoprim (BACTRIM DS,SEPTRA DS) 800-160 MG tablet         Today   VITAL SIGNS:  Blood pressure (!) 160/57, pulse (!) 53, temperature 97.5 F (36.4 C), temperature source Oral, resp. rate 16, height 5\' 3"  (1.6 m), weight 67.4 kg (148 lb 9.6 oz), SpO2 95 %.  I/O:   Intake/Output Summary (Last 24 hours) at 03/09/17 1259 Last data filed at 03/09/17 1007  Gross per 24 hour  Intake  840 ml  Output                0 ml  Net              840 ml    PHYSICAL EXAMINATION:  Physical Exam  GENERAL:  83 y.o.-year-old patient lying in the bed with no acute distress.  LUNGS: Normal breath sounds bilaterally, no wheezing, rales,rhonchi or crepitation. No use of accessory muscles of respiration.  CARDIOVASCULAR: S1, S2 normal. No murmurs, rubs, or gallops.  ABDOMEN: Soft, non-tender, non-distended. Bowel sounds present. NEUROLOGIC: Moves all 4 extremities. PSYCHIATRIC: The patient is alert and awake SKIN: No obvious rash, lesion, or ulcer.   DATA REVIEW:   CBC  Recent Labs Lab 03/06/17 0456  WBC 4.4  HGB 8.9*  HCT 26.8*  PLT 114*    Chemistries   Recent Labs Lab 03/09/17 0346  NA 140  K 4.5  CL 110  CO2 24  GLUCOSE 87  BUN 65*  CREATININE 1.85*  CALCIUM 8.0*    Cardiac Enzymes No results for  input(s): TROPONINI in the last 168 hours.  Microbiology Results  Results for orders placed or performed during the hospital encounter of 02/26/17  MRSA PCR Screening     Status: None   Collection Time: 03/01/17  5:05 PM  Result Value Ref Range Status   MRSA by PCR NEGATIVE NEGATIVE Final    Comment:        The GeneXpert MRSA Assay (FDA approved for NASAL specimens only), is one component of a comprehensive MRSA colonization surveillance program. It is not intended to diagnose MRSA infection nor to guide or monitor treatment for MRSA infections.     RADIOLOGY:  No results found.  Follow up with PCP in 1 week.  Management plans discussed with the patient, family and they are in agreement.  CODE STATUS:     Code Status Orders        Start     Ordered   02/26/17 2326  Do not attempt resuscitation (DNR)  Continuous    Question Answer Comment  In the event of cardiac or respiratory ARREST Do not call a "code blue"   In the event of cardiac or respiratory ARREST Do not perform Intubation, CPR, defibrillation or ACLS   In the event of cardiac or respiratory ARREST Use medication by any route, position, wound care, and other measures to relive pain and suffering. May use oxygen, suction and manual treatment of airway obstruction as needed for comfort.      02/26/17 2325    Code Status History    Date Active Date Inactive Code Status Order ID Comments User Context   07/05/2016  5:47 PM 07/09/2016  7:51 PM Full Code [id number]  Marguarite Arbour, MD ED   07/03/2016 11:04 AM 07/03/2016  5:26 PM Full Code [id number]  Milagros Loll, MD ED   12/31/2015  5:42 PM 01/06/2016  7:12 PM Full Code [id number]  [name], MD Inpatient   09/13/2015 10:55 PM 09/15/2015  8:25 PM Full Code [id number]  Oralia Manis, MD Inpatient   08/28/2015  8:14 AM 08/29/2015  4:49 PM DNR [id number]  Delfino Lovett, MD Inpatient   08/27/2015 11:25 PM 08/28/2015  8:14 AM Full Code [id number]  Hower, Cletis Athens, MD ED     Advance Directive Documentation     Most Recent Value  Type of Advance Directive  Out of facility DNR (pink MOST or yellow form)  Pre-existing out of facility DNR  order (yellow form or pink MOST form)  -  "MOST" Form in Place?  -      TOTAL TIME TAKING CARE OF THIS PATIENT ON DAY OF DISCHARGE: more than 30 minutes.   Milagros LollSudini, Reonna Finlayson R M.D on 03/09/2017 at 12:59 PM  Between 7am to 6pm - Pager - [phone]  After 6pm go to [email or web address] - password [email or web address] [facility]  [facility] [place] [facility]  Office  [phone]  CC: Primary care physician; Merlene PullingMcGranaghan, Mary Beth, PA-C  Note: This dictation was prepared with Dragon dictation along with smaller phrase technology. Any transcriptional errors that result from this process are unintentional.

## 2017-03-09 NOTE — Care Management Important Message (Signed)
Important Message  Patient Details  Name: Stacey ManchesterMiriam Baker MRN: [id number] Date of Birth: 11/12/1931   Medicare Important Message Given:  Yes (reviewed via telephone with son, copy placed in room)    Chapman FitchBOWEN, Gokul Waybright T, RN 03/09/2017, 3:08 PM

## 2017-03-09 NOTE — Clinical Social Work Note (Signed)
CSW attempted to reach patient's son again this morning but had to leave another message. CSW contacted the home number and spoke with patient's daughter in law and she has [facility]. CSW has made referral to Clydie BraunKaren with [facility]. Patient's daughter in law stated she was going to reach out to her husband at work and have him check his cell phone more frequently. York SpanielMonica Ashleyann Baker MSW,LCSW [phone]

## 2017-03-09 NOTE — Progress Notes (Signed)
Patient sent out via EMS for discharge

## 2017-03-09 NOTE — [place] FL2 (Signed)
[place] MEDICAID FL2 LEVEL OF CARE SCREENING TOOL     IDENTIFICATION  Patient Name: Stacey Baker Birthdate: 1931-12-02 Sex: female Admission Date (Current Location): 02/26/2017  [place] and [place] Number:  Chiropodist and Address:  [facility], [address], [place], [place] [address]      Provider Number: [id number]  Attending Physician Name and Address:  Stacey Loll, MD  Relative Name and Phone Number:       Current Level of Care: Hospital Recommended Level of Care: Assisted Living Facility Prior Approval Number:    Date Approved/Denied:   PASRR Number:    Discharge Plan:  (ALF)    Current Diagnoses: Patient Active Problem List   Diagnosis Date Noted  . Expressive aphasia   . Palliative care encounter   . Goals of care, counseling/discussion   . Acute on chronic renal failure (HCC) 02/26/2017  . Anemia 02/26/2017  . Guaiac positive stools 02/26/2017  . Fall 02/26/2017  . Hypoxia   . Atrial tachycardia (HCC)   . Generalized weakness 07/05/2016  . Thrombocytopenia (HCC) 07/05/2016  . UTI (urinary tract infection) 07/05/2016  . Acute respiratory failure (HCC) 07/05/2016  . Pleural effusion 07/05/2016  . CKD (chronic kidney disease), stage III   . Pericardial effusion   . Sinus bradycardia   . Acute renal failure (ARF) (HCC) 12/31/2015  . Chronic renal insufficiency 09/15/2015  . Bradycardia 09/15/2015  . Cellulitis 09/13/2015  . Parkinson disease (HCC) 09/13/2015  . HTN (hypertension) 09/13/2015  . HLD (hyperlipidemia) 09/13/2015  . Depression 09/13/2015  . Dementia 09/13/2015  . Hyperkalemia 09/13/2015  . TIA (transient ischemic attack) 08/27/2015    Orientation RESPIRATION BLADDER Height & Weight     Self, Place  Normal Incontinent Weight: 148 lb 9.6 oz (67.4 kg) Height:  5\' 3"  (160 cm)  BEHAVIORAL SYMPTOMS/MOOD NEUROLOGICAL BOWEL NUTRITION STATUS   (none)  (none) Incontinent Diet (dysphagia 3; aspiration  precautions)  AMBULATORY STATUS COMMUNICATION OF NEEDS Skin   Total Care Verbally Normal                       Personal Care Assistance Level of Assistance  Bathing, Dressing, Feeding Bathing Assistance: Maximum assistance Feeding assistance: Maximum assistance Dressing Assistance: Maximum assistance     Functional Limitations Info  Sight, Hearing Sight Info: Impaired Hearing Info: Impaired      SPECIAL CARE FACTORS FREQUENCY   (Patient will have [facility])                    Contractures Contractures Info: Not present    Additional Factors Info  Code Status, Allergies Code Status Info: dnr Allergies Info: pcns; plavix;               DISCHARGE MEDICATIONS:       Current Discharge Medication List        START taking these medications   Details  feeding supplement, ENSURE ENLIVE, (ENSURE ENLIVE) LIQD Take 237 mLs by mouth 2 (two) times daily between meals. Qty: 237 mL, Refills: 12          CONTINUE these medications which have NOT CHANGED   Details  acetaminophen (TYLENOL) 325 MG tablet Take 650 mg by mouth every 6 (six) hours as needed.    acidophilus (RISAQUAD) CAPS capsule Take 1 capsule by mouth 2 (two) times daily.    amLODipine (NORVASC) 10 MG tablet Take 1 tablet (10 mg total) by mouth daily. Qty: 30 tablet,  Refills: 5    aspirin EC 325 MG tablet Take 1 tablet (325 mg total) by mouth daily. Qty: 30 tablet, Refills: 3    atorvastatin (LIPITOR) 10 MG tablet Take 10 mg by mouth at bedtime.    carbidopa-levodopa (SINEMET IR) 25-100 MG tablet Take 1 tablet by mouth 3 (three) times daily.    diclofenac sodium (VOLTAREN) 1 % GEL Apply 2 g topically 2 (two) times daily. Pt applies to left knee.    donepezil (ARICEPT) 10 MG tablet Take 10 mg by mouth daily.    escitalopram (LEXAPRO) 10 MG tablet Take 10 mg by mouth daily.    ferrous sulfate 325 (65 FE) MG tablet Take 325 mg by mouth 2 (two) times daily.     levETIRAcetam (KEPPRA) 250 MG tablet Take 250 mg by mouth daily.     miconazole (BAZA ANTIFUNGAL) 2 % cream Apply 1 application topically 2 (two) times daily as needed. To sacrum every shift for redness after each toilet    Multiple Vitamins-Minerals (PRESERVISION AREDS 2 PO) Take 2 capsules by mouth daily.    QUEtiapine (SEROQUEL) 25 MG tablet Take 25 mg by mouth at bedtime.    Soft Lens Products (EQ SALINE SOLUTION/SENSITIVE) SOLN Apply 1 application topically every other day. Apply to neck topically once every other day for wound until healed.    vitamin B-12 (CYANOCOBALAMIN) 1000 MCG tablet Take 1,000 mcg by mouth daily.         STOP taking these medications     cyclobenzaprine (FLEXERIL) 10 MG tablet      mirtazapine (REMERON) 15 MG tablet      sulfamethoxazole-trimethoprim (BACTRIM DS,SEPTRA DS) 800-160 MG tablet        Stacey Baker MSW,LCSW

## 2017-03-09 NOTE — Discharge Instructions (Signed)
°  Diet at Discharge recommendation:  Dysphagia level 3(w/ chopped/minced meats and Gravy); Thin liquids. STRICT aspiration precautions including PINCHING straw during drinking to limit amount d/t impulsive drinking behavior. Pills in Puree - CRUSHED. Feeding Support at meals.

## 2017-03-09 NOTE — Clinical Social Work Note (Signed)
CSW was able to call patient's son's work and have a message sent to him that [facility] will be going to [facility] this [facility]. Clydie BraunKaren with Hospice has confirmed that has been able to speak with the patient's son. York SpanielMonica Kaiea Esselman MSW,LCSW

## 2017-03-09 NOTE — Clinical Social Work Note (Signed)
Physician to discharge patient today. CSW has notified patient's son that this is a possibility. CSW spoke with Lupita Leashonna at [facility] and [facility]. [facility] to [facility], then call EMS. York SpanielMonica Tatum Corl MSW,LCSW [phone]

## 2017-03-09 NOTE — Progress Notes (Signed)
Report called to HornellLisa at [facility].  EMS notified for transport

## 2017-03-17 IMAGING — CR DG CHEST 1V PORT
1 series · 1 of 1 positions shown · non-contrast
Comparison: Chest x-ray 05/03/2014.

CLINICAL DATA: 82-year-old female with altered mental status.

EXAM:
PORTABLE CHEST - 1 VIEW

[ap]
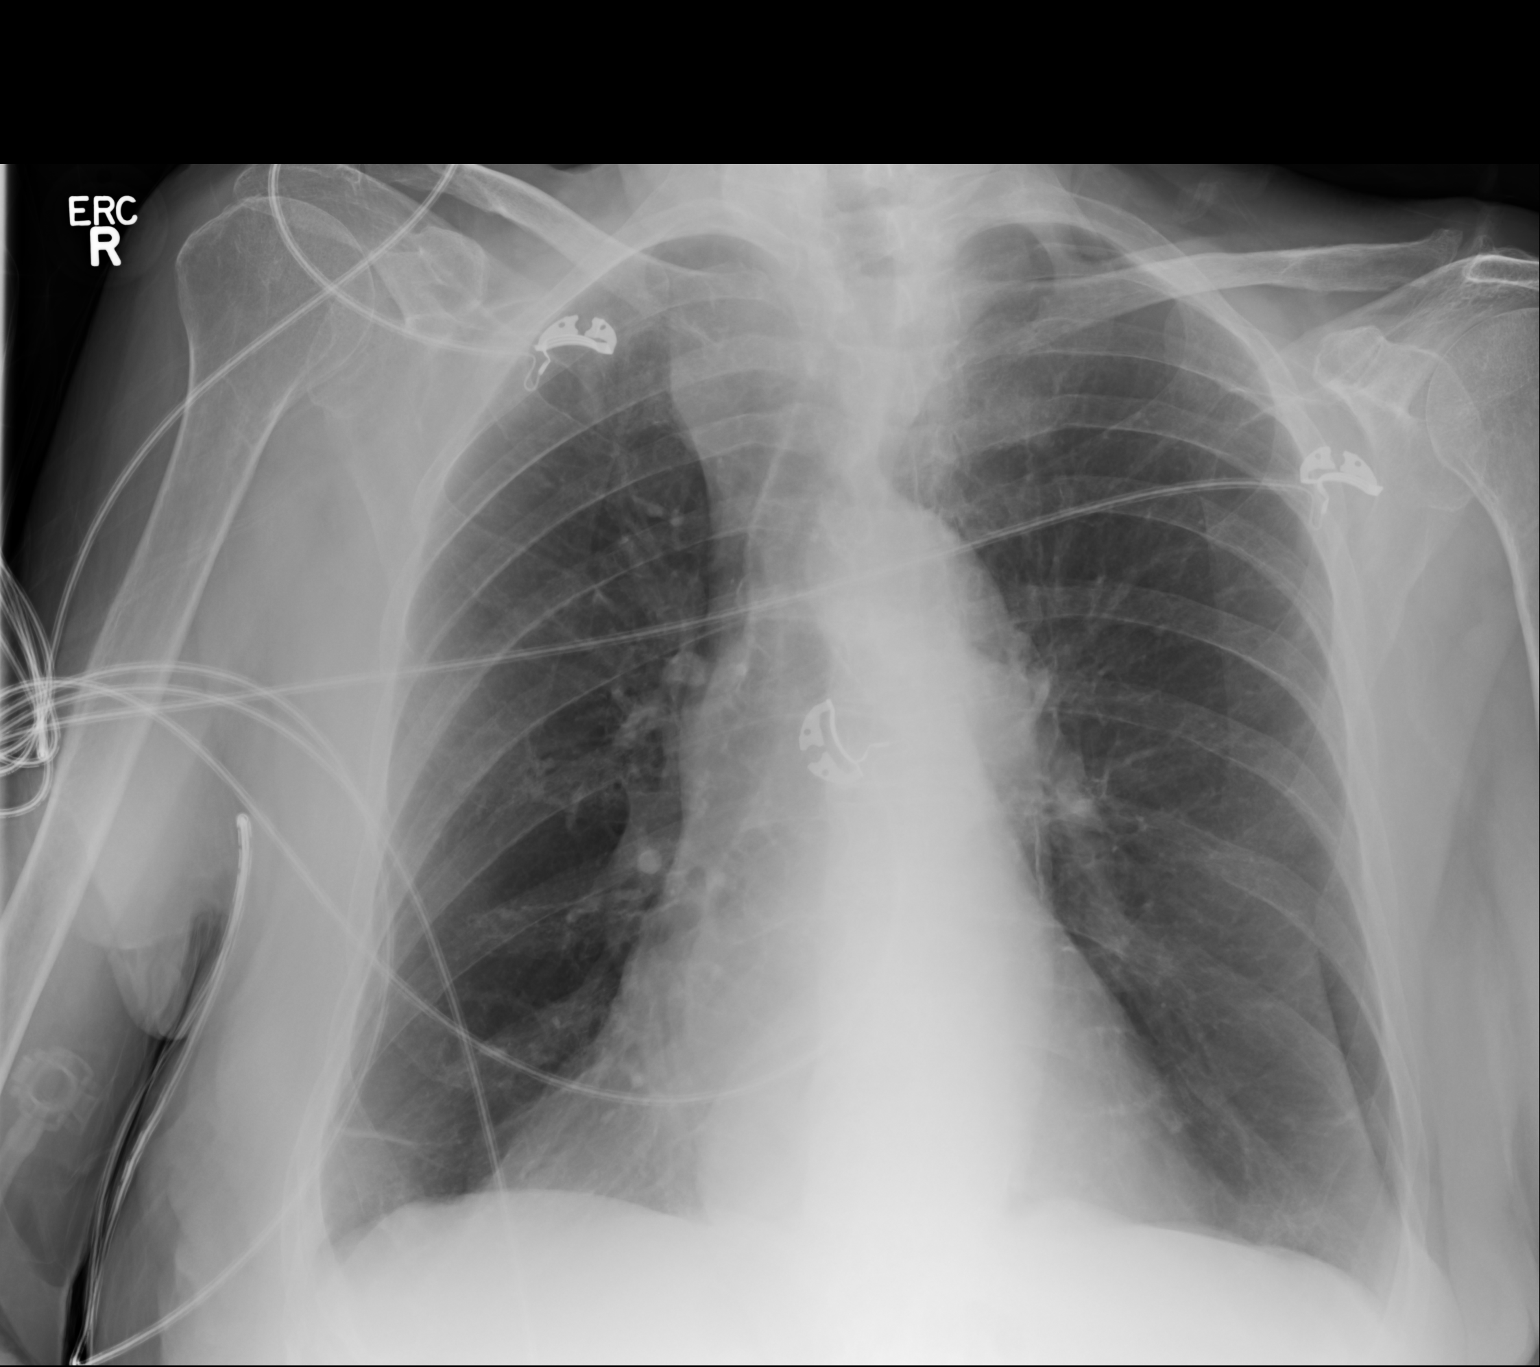

[1 of 1 positions shown; findings below may reference images not displayed]

FINDINGS: Emphysematous changes in the lungs with mild hyperexpansion. No
acute consolidative airspace disease. No pleural effusions. Skin
fold artifact overlying the lower left hemithorax incidentally
noted. No evidence of pulmonary edema. Heart size appears mildly
enlarged. The patient is rotated to the right on today's exam,
resulting in distortion of the mediastinal contours and reduced
diagnostic sensitivity and specificity for mediastinal pathology.
Prominent soft tissue in the upper mediastinum on the right side
appears to correspond with enlarged right lobe of thyroid gland on
prior neck CT 05/11/2014. Atherosclerotic calcifications in the
thoracic aorta.
IMPRESSION: 1. No radiographic evidence of acute cardiopulmonary disease.
2. Emphysema.
3. Atherosclerosis.

## 2017-04-13 NOTE — Progress Notes (Deleted)
[facility]  Telephone:(336) [phone] Fax:(336) [phone]  ID: Stacey ManchesterMiriam Baker OB: 12/31/1931  MR#: [id number]  AOZ#:[id number]#:[id number]  Patient Care Team: Merlene PullingMcGranaghan, Mary Beth, PA-C as PCP - General (Physician Assistant) Antonieta IbaGollan, Stacey Spoelstra J, MD as Consulting Physician (Cardiology)  CHIEF COMPLAINT: Anemia of chronic kidney disease.  INTERVAL HISTORY: ***  REVIEW OF SYSTEMS:   ROS  As per HPI. Otherwise, a complete review of systems is negative.  PAST MEDICAL HISTORY: Past Medical History:  Diagnosis Date  . Apraxia following cerebral infarction   . CKD (chronic kidney disease), stage III    a. With acute worsening/AKI noted on several admissions.  . Dementia   . Dementia without behavioral disturbance   . Depression   . Encephalopathy   . Hyperkalemia    a. 08/2015  . Hyperlipidemia   . Hyperlipidemia   . Hypertension   . Iron deficiency anemia   . Major depressive disorder   . Muscle weakness   . Parkinson disease (HCC)   . Parkinson's disease (HCC)   . Pericardial effusion    a. 06/2016 Echo: Ef 60-65%, no rwma, Gr1 DD, mild MR, mildly dil LA, PASP 72mmHg, mod circumferential pericardial effusion - no hemodynamic compromise.  . Sinus bradycardia    a. 08/2015 in setting of beta blocker and hyperkalemia.  . Stroke (HCC)   . TIA (transient ischemic attack)    a. 08/2015 - essentially neg w/u for stroke.  Marland Kitchen. UTI (urinary tract infection)     PAST SURGICAL HISTORY: Past Surgical History:  Procedure Laterality Date  . ABDOMINAL HYSTERECTOMY    . APPENDECTOMY    . CHOLECYSTECTOMY      FAMILY HISTORY: Family History  Problem Relation Age of Onset  . Heart disease Mother   . Hypertension Other     ADVANCED DIRECTIVES (Y/N):  N  HEALTH MAINTENANCE: Social History  Substance Use Topics  . Smoking status: Never Smoker  . Smokeless tobacco: Never Used  . Alcohol use No     Colonoscopy:  PAP:  Bone density:  Lipid panel:  Allergies  Allergen  Reactions  . Penicillins Other (See Comments)    Reaction:  Unknown   . Plavix [Clopidogrel Bisulfate] Other (See Comments)    Reaction:  Unknown     Current Outpatient Prescriptions  Medication Sig Dispense Refill  . acetaminophen (TYLENOL) 325 MG tablet Take 650 mg by mouth every 6 (six) hours as needed.    Marland Kitchen. acidophilus (RISAQUAD) CAPS capsule Take 1 capsule by mouth 2 (two) times daily.    Marland Kitchen. amLODipine (NORVASC) 10 MG tablet Take 1 tablet (10 mg total) by mouth daily. 30 tablet 5  . aspirin EC 325 MG tablet Take 1 tablet (325 mg total) by mouth daily. 30 tablet 3  . atorvastatin (LIPITOR) 10 MG tablet Take 10 mg by mouth at bedtime.    . carbidopa-levodopa (SINEMET IR) 25-100 MG tablet Take 1 tablet by mouth 3 (three) times daily.    . diclofenac sodium (VOLTAREN) 1 % GEL Apply 2 g topically 2 (two) times daily. Pt applies to left knee.    . donepezil (ARICEPT) 10 MG tablet Take 10 mg by mouth daily.    Marland Kitchen. escitalopram (LEXAPRO) 10 MG tablet Take 10 mg by mouth daily.    . feeding supplement, ENSURE ENLIVE, (ENSURE ENLIVE) LIQD Take 237 mLs by mouth 2 (two) times daily between meals. 60 Bottle 0  . ferrous sulfate 325 (65 FE) MG tablet Take 325 mg by mouth 2 (two)  times daily.    Marland Kitchen levETIRAcetam (KEPPRA) 250 MG tablet Take 250 mg by mouth daily.     . miconazole (BAZA ANTIFUNGAL) 2 % cream Apply 1 application topically 2 (two) times daily as needed. To sacrum every shift for redness after each toilet    . Multiple Vitamins-Minerals (PRESERVISION AREDS 2 PO) Take 2 capsules by mouth daily.    . QUEtiapine (SEROQUEL) 25 MG tablet Take 25 mg by mouth at bedtime.    . Soft Lens Products (EQ SALINE SOLUTION/SENSITIVE) SOLN Apply 1 application topically every other day. Apply to neck topically once every other day for wound until healed.    . vitamin B-12 (CYANOCOBALAMIN) 1000 MCG tablet Take 1,000 mcg by mouth daily.     No current facility-administered medications for this visit.      OBJECTIVE: There were no vitals filed for this visit.   There is no height or weight on file to calculate BMI.    ECOG FS:{CHL ONC [id number]  General: Well-developed, well-nourished, no acute distress. Eyes: Pink conjunctiva, anicteric sclera. HEENT: Normocephalic, moist mucous membranes, clear oropharnyx. Lungs: Clear to auscultation bilaterally. Heart: Regular rate and rhythm. No rubs, murmurs, or gallops. Abdomen: Soft, nontender, nondistended. No organomegaly noted, normoactive bowel sounds. Musculoskeletal: No edema, cyanosis, or clubbing. Neuro: Alert, answering all questions appropriately. Cranial nerves grossly intact. Skin: No rashes or petechiae noted. Psych: Normal affect. Lymphatics: No cervical, calvicular, axillary or inguinal LAD.   LAB RESULTS:  Lab Results  Component Value Date   NA 140 03/09/2017   K 4.5 03/09/2017   CL 110 03/09/2017   CO2 24 03/09/2017   GLUCOSE 87 03/09/2017   BUN 65 (H) 03/09/2017   CREATININE 1.85 (H) 03/09/2017   CALCIUM 8.0 (L) 03/09/2017   PROT 6.8 03/01/2017   ALBUMIN 3.5 03/01/2017   AST 18 03/01/2017   ALT <5 (L) 03/01/2017   ALKPHOS 94 03/01/2017   BILITOT 0.5 03/01/2017   GFRNONAA 24 (L) 03/09/2017   GFRAA 28 (L) 03/09/2017    Lab Results  Component Value Date   WBC 4.4 03/06/2017   NEUTROABS 4.6 02/26/2017   HGB 8.9 (L) 03/06/2017   HCT 26.8 (L) 03/06/2017   MCV 88.5 03/06/2017   PLT 114 (L) 03/06/2017     STUDIES: No results found.  ASSESSMENT: Anemia of chronic kidney disease  PLAN:    1. Anemia of chronic kidney disease:  Patient expressed understanding and was in agreement with this plan. She also understands that She can call clinic at any time with any questions, concerns, or complaints.    Jeralyn Ruths, MD   04/13/2017 11:53 PM

## 2017-04-14 ENCOUNTER — Inpatient Hospital Stay: Payer: Medicare Other | Admitting: Oncology

## 2017-05-31 NOTE — Progress Notes (Deleted)
[facility]  Telephone:(336) [phone] Fax:(336) [phone]  ID: Stacey Baker OB: [birth year]  MR#: [id number]  AOZ#:[id number]  Patient Care Team: Merlene Pulling, PA-C as PCP - General (Physician Assistant) Antonieta Iba, MD as Consulting Physician (Cardiology)  CHIEF COMPLAINT: Anemia secondary to chronic renal failure.  INTERVAL HISTORY: ***  REVIEW OF SYSTEMS:   ROS  As per HPI. Otherwise, a complete review of systems is negative.  PAST MEDICAL HISTORY: Past Medical History:  Diagnosis Date  . Apraxia following cerebral infarction   . CKD (chronic kidney disease), stage III    a. With acute worsening/AKI noted on several admissions.  . Dementia   . Dementia without behavioral disturbance   . Depression   . Encephalopathy   . Hyperkalemia    a. 08/2015  . Hyperlipidemia   . Hyperlipidemia   . Hypertension   . Iron deficiency anemia   . Major depressive disorder   . Muscle weakness   . Parkinson disease (HCC)   . Parkinson's disease (HCC)   . Pericardial effusion    a. 06/2016 Echo: Ef 60-65%, no rwma, Gr1 DD, mild MR, mildly dil LA, PASP , mod circumferential pericardial effusion - no hemodynamic compromise.  . Sinus bradycardia    a. 08/2015 in setting of beta blocker and hyperkalemia.  . Stroke (HCC)   . TIA (transient ischemic attack)    a. 08/2015 - essentially neg w/u for stroke.  Marland Kitchen UTI (urinary tract infection)     PAST SURGICAL HISTORY: Past Surgical History:  Procedure Laterality Date  . ABDOMINAL HYSTERECTOMY    . APPENDECTOMY    . CHOLECYSTECTOMY      FAMILY HISTORY: Family History  Problem Relation Age of Onset  . Heart disease Mother   . Hypertension Other     ADVANCED DIRECTIVES (Y/N):  N  HEALTH MAINTENANCE: Social History  Substance Use Topics  . Smoking status: Never Smoker  . Smokeless tobacco: Never Used  . Alcohol use No     Colonoscopy:  PAP:  Bone density:  Lipid panel:  Allergies    Allergen Reactions  . Penicillins Other (See Comments)    Reaction:  Unknown   . Plavix [Clopidogrel Bisulfate] Other (See Comments)    Reaction:  Unknown     Current Outpatient Prescriptions  Medication Sig Dispense Refill  . acetaminophen (TYLENOL) 325 MG tablet Take 650 mg by mouth every 6 (six) hours as needed.    Marland Kitchen acidophilus (RISAQUAD) CAPS capsule Take 1 capsule by mouth 2 (two) times daily.    Marland Kitchen amLODipine (NORVASC) 10 MG tablet Take 1 tablet (10 mg total) by mouth daily. 30 tablet 5  . aspirin EC 325 MG tablet Take 1 tablet (325 mg total) by mouth daily. 30 tablet 3  . atorvastatin (LIPITOR) 10 MG tablet Take 10 mg by mouth at bedtime.    . carbidopa-levodopa (SINEMET IR) 25-100 MG tablet Take 1 tablet by mouth 3 (three) times daily.    . diclofenac sodium (VOLTAREN) 1 % GEL Apply 2 g topically 2 (two) times daily. Pt applies to left knee.    . donepezil (ARICEPT) 10 MG tablet Take 10 mg by mouth daily.    Marland Kitchen escitalopram (LEXAPRO) 10 MG tablet Take 10 mg by mouth daily.    . feeding supplement, ENSURE ENLIVE, (ENSURE ENLIVE) LIQD Take 237 mLs by mouth 2 (two) times daily between meals. 60 Bottle 0  . ferrous sulfate 325 (65 FE) MG tablet Take 325 mg by mouth  2 (two) times daily.    Marland Kitchen. levETIRAcetam (KEPPRA) 250 MG tablet Take 250 mg by mouth daily.     . miconazole (BAZA ANTIFUNGAL) 2 % cream Apply 1 application topically 2 (two) times daily as needed. To sacrum every shift for redness after each toilet    . Multiple Vitamins-Minerals (PRESERVISION AREDS 2 PO) Take 2 capsules by mouth daily.    . QUEtiapine (SEROQUEL) 25 MG tablet Take 25 mg by mouth at bedtime.    . Soft Lens Products (EQ SALINE SOLUTION/SENSITIVE) SOLN Apply 1 application topically every other day. Apply to neck topically once every other day for wound until healed.    . vitamin B-12 (CYANOCOBALAMIN) 1000 MCG tablet Take 1,000 mcg by mouth daily.     No current facility-administered medications for this visit.      OBJECTIVE: There were no vitals filed for this visit.   There is no height or weight on file to calculate BMI.    ECOG FS:{CHL ONC [phone]}  General: Well-developed, well-nourished, no acute distress. Eyes: Pink conjunctiva, anicteric sclera. HEENT: Normocephalic, moist mucous membranes, clear oropharnyx. Lungs: Clear to auscultation bilaterally. Heart: Regular rate and rhythm. No rubs, murmurs, or gallops. Abdomen: Soft, nontender, nondistended. No organomegaly noted, normoactive bowel sounds. Musculoskeletal: No edema, cyanosis, or clubbing. Neuro: Alert, answering all questions appropriately. Cranial nerves grossly intact. Skin: No rashes or petechiae noted. Psych: Normal affect. Lymphatics: No cervical, calvicular, axillary or inguinal LAD.   LAB RESULTS:  Lab Results  Component Value Date   NA 140 03/09/2017   K 4.5 03/09/2017   CL 110 03/09/2017   CO2 24 03/09/2017   GLUCOSE 87 03/09/2017   BUN 65 (H) 03/09/2017   CREATININE 1.85 (H) 03/09/2017   CALCIUM 8.0 (L) 03/09/2017   PROT 6.8 03/01/2017   ALBUMIN 3.5 03/01/2017   AST 18 03/01/2017   ALT <5 (L) 03/01/2017   ALKPHOS 94 03/01/2017   BILITOT 0.5 03/01/2017   GFRNONAA 24 (L) 03/09/2017   GFRAA 28 (L) 03/09/2017    Lab Results  Component Value Date   WBC 4.4 03/06/2017   NEUTROABS 4.6 02/26/2017   HGB 8.9 (L) 03/06/2017   HCT 26.8 (L) 03/06/2017   MCV 88.5 03/06/2017   PLT 114 (L) 03/06/2017     STUDIES: No results found.  ASSESSMENT: Anemia secondary to chronic renal failure.  PLAN:    1. Anemia secondary to chronic renal failure:  Patient expressed understanding and was in agreement with this plan. She also understands that She can call clinic at any time with any questions, concerns, or complaints.    Jeralyn Ruthsimothy J Finnegan, MD   05/31/2017 10:15 AM

## 2017-06-01 ENCOUNTER — Inpatient Hospital Stay: Admitting: Oncology

## 2017-06-01 ENCOUNTER — Telehealth: Payer: Self-pay | Admitting: Oncology

## 2017-06-01 NOTE — Telephone Encounter (Signed)
Received an incoming call from the Transportation Co[facility]Ordinator at [facility] requesting to reschedule  A New Pt Appt.  Per Transportation Co-ordinator [place], [place]) appt was reschuled from 06/01/17 to 06/17/17, per patient not feeling well. MF 06/01/17

## 2017-06-01 NOTE — Telephone Encounter (Signed)
06/01/17 appointment did not have a referral attached to it.  Advised Tia, whom scheduled 06/01/17 appt.

## 2017-06-14 NOTE — Progress Notes (Deleted)
[facility]  Telephone:(336) [phone] Fax:(336) [phone]  ID: [name] OB: 1932/01/20  MR#: [id number]  BDZ#:[id number]  Patient Care Team: Merlene Pulling, PA-C as PCP - General (Physician Assistant) Antonieta Iba, MD as Consulting Physician (Cardiology)  CHIEF COMPLAINT: Anemia secondary to chronic renal failure.  INTERVAL HISTORY: ***  REVIEW OF SYSTEMS:   ROS  As per HPI. Otherwise, a complete review of systems is negative.  PAST MEDICAL HISTORY: Past Medical History:  Diagnosis Date  . Apraxia following cerebral infarction   . CKD (chronic kidney disease), stage III    a. With acute worsening/AKI noted on several admissions.  . Dementia   . Dementia without behavioral disturbance   . Depression   . Encephalopathy   . Hyperkalemia    a. 08/2015  . Hyperlipidemia   . Hyperlipidemia   . Hypertension   . Iron deficiency anemia   . Major depressive disorder   . Muscle weakness   . Parkinson disease (HCC)   . Parkinson's disease (HCC)   . Pericardial effusion    a. 06/2016 Echo: Ef 60-65%, no rwma, Gr1 DD, mild MR, mildly dil LA, PASP , mod circumferential pericardial effusion - no hemodynamic compromise.  . Sinus bradycardia    a. 08/2015 in setting of beta blocker and hyperkalemia.  . Stroke (HCC)   . TIA (transient ischemic attack)    a. 08/2015 - essentially neg w/u for stroke.  Marland Kitchen UTI (urinary tract infection)     PAST SURGICAL HISTORY: Past Surgical History:  Procedure Laterality Date  . ABDOMINAL HYSTERECTOMY    . APPENDECTOMY    . CHOLECYSTECTOMY      FAMILY HISTORY: Family History  Problem Relation Age of Onset  . Heart disease Mother   . Hypertension Other     ADVANCED DIRECTIVES (Y/N):  N  HEALTH MAINTENANCE: Social History  Substance Use Topics  . Smoking status: Never Smoker  . Smokeless tobacco: Never Used  . Alcohol use No     Colonoscopy:  PAP:  Bone density:  Lipid panel:  Allergies    Allergen Reactions  . Penicillins Other (See Comments)    Reaction:  Unknown   . Plavix [Clopidogrel Bisulfate] Other (See Comments)    Reaction:  Unknown     Current Outpatient Prescriptions  Medication Sig Dispense Refill  . acetaminophen (TYLENOL) 325 MG tablet Take 650 mg by mouth every 6 (six) hours as needed.    Marland Kitchen acidophilus (RISAQUAD) CAPS capsule Take 1 capsule by mouth 2 (two) times daily.    Marland Kitchen amLODipine (NORVASC) 10 MG tablet Take 1 tablet (10 mg total) by mouth daily. 30 tablet 5  . aspirin EC 325 MG tablet Take 1 tablet (325 mg total) by mouth daily. 30 tablet 3  . atorvastatin (LIPITOR) 10 MG tablet Take 10 mg by mouth at bedtime.    . carbidopa-levodopa (SINEMET IR) 25-100 MG tablet Take 1 tablet by mouth 3 (three) times daily.    . diclofenac sodium (VOLTAREN) 1 % GEL Apply 2 g topically 2 (two) times daily. Pt applies to left knee.    . donepezil (ARICEPT) 10 MG tablet Take 10 mg by mouth daily.    Marland Kitchen escitalopram (LEXAPRO) 10 MG tablet Take 10 mg by mouth daily.    . feeding supplement, ENSURE ENLIVE, (ENSURE ENLIVE) LIQD Take 237 mLs by mouth 2 (two) times daily between meals. 60 Bottle 0  . ferrous sulfate 325 (65 FE) MG tablet Take 325 mg by mouth  2 (two) times daily.    Marland Kitchen levETIRAcetam (KEPPRA) 250 MG tablet Take 250 mg by mouth daily.     . miconazole (BAZA ANTIFUNGAL) 2 % cream Apply 1 application topically 2 (two) times daily as needed. To sacrum every shift for redness after each toilet    . Multiple Vitamins-Minerals (PRESERVISION AREDS 2 PO) Take 2 capsules by mouth daily.    . QUEtiapine (SEROQUEL) 25 MG tablet Take 25 mg by mouth at bedtime.    . Soft Lens Products (EQ SALINE SOLUTION/SENSITIVE) SOLN Apply 1 application topically every other day. Apply to neck topically once every other day for wound until healed.    . vitamin B-12 (CYANOCOBALAMIN) 1000 MCG tablet Take 1,000 mcg by mouth daily.     No current facility-administered medications for this visit.      OBJECTIVE: There were no vitals filed for this visit.   There is no height or weight on file to calculate BMI.    ECOG FS:{CHL ONC [id number]  General: Well-developed, well-nourished, no acute distress. Eyes: Pink conjunctiva, anicteric sclera. HEENT: Normocephalic, moist mucous membranes, clear oropharnyx. Lungs: Clear to auscultation bilaterally. Heart: Regular rate and rhythm. No rubs, murmurs, or gallops. Abdomen: Soft, nontender, nondistended. No organomegaly noted, normoactive bowel sounds. Musculoskeletal: No edema, cyanosis, or clubbing. Neuro: Alert, answering all questions appropriately. Cranial nerves grossly intact. Skin: No rashes or petechiae noted. Psych: Normal affect. Lymphatics: No cervical, calvicular, axillary or inguinal LAD.   LAB RESULTS:  Lab Results  Component Value Date   NA 140 03/09/2017   K 4.5 03/09/2017   CL 110 03/09/2017   CO2 24 03/09/2017   GLUCOSE 87 03/09/2017   BUN 65 (H) 03/09/2017   CREATININE 1.85 (H) 03/09/2017   CALCIUM 8.0 (L) 03/09/2017   PROT 6.8 03/01/2017   ALBUMIN 3.5 03/01/2017   AST 18 03/01/2017   ALT <5 (L) 03/01/2017   ALKPHOS 94 03/01/2017   BILITOT 0.5 03/01/2017   GFRNONAA 24 (L) 03/09/2017   GFRAA 28 (L) 03/09/2017    Lab Results  Component Value Date   WBC 4.4 03/06/2017   NEUTROABS 4.6 02/26/2017   HGB 8.9 (L) 03/06/2017   HCT 26.8 (L) 03/06/2017   MCV 88.5 03/06/2017   PLT 114 (L) 03/06/2017     STUDIES: No results found.  ASSESSMENT: Anemia secondary to chronic renal failure.  PLAN:    1. Anemia secondary to chronic renal failure:  Patient expressed understanding and was in agreement with this plan. She also understands that She can call clinic at any time with any questions, concerns, or complaints.    Jeralyn Ruths, MD   06/14/2017 8:54 AM

## 2017-06-17 ENCOUNTER — Encounter: Payer: Self-pay | Admitting: Oncology

## 2017-06-17 ENCOUNTER — Inpatient Hospital Stay: Admitting: Oncology

## 2017-06-17 ENCOUNTER — Inpatient Hospital Stay: Attending: Oncology | Admitting: Oncology

## 2017-06-17 ENCOUNTER — Inpatient Hospital Stay

## 2017-06-17 VITALS — BP 109/67 | HR 67 | Temp 95.5°F | Resp 18 | Wt 132.5 lb

## 2017-06-17 DIAGNOSIS — N183 Chronic kidney disease, stage 3 unspecified: Secondary | ICD-10-CM

## 2017-06-17 DIAGNOSIS — D631 Anemia in chronic kidney disease: Secondary | ICD-10-CM | POA: Insufficient documentation

## 2017-06-17 DIAGNOSIS — E785 Hyperlipidemia, unspecified: Secondary | ICD-10-CM | POA: Insufficient documentation

## 2017-06-17 DIAGNOSIS — R5383 Other fatigue: Secondary | ICD-10-CM | POA: Diagnosis not present

## 2017-06-17 DIAGNOSIS — G9340 Encephalopathy, unspecified: Secondary | ICD-10-CM | POA: Diagnosis not present

## 2017-06-17 DIAGNOSIS — R001 Bradycardia, unspecified: Secondary | ICD-10-CM | POA: Insufficient documentation

## 2017-06-17 DIAGNOSIS — G20 Parkinson's disease: Secondary | ICD-10-CM | POA: Diagnosis not present

## 2017-06-17 DIAGNOSIS — R531 Weakness: Secondary | ICD-10-CM | POA: Diagnosis not present

## 2017-06-17 DIAGNOSIS — Z79899 Other long term (current) drug therapy: Secondary | ICD-10-CM | POA: Insufficient documentation

## 2017-06-17 DIAGNOSIS — E875 Hyperkalemia: Secondary | ICD-10-CM | POA: Insufficient documentation

## 2017-06-17 DIAGNOSIS — I313 Pericardial effusion (noninflammatory): Secondary | ICD-10-CM | POA: Insufficient documentation

## 2017-06-17 DIAGNOSIS — D696 Thrombocytopenia, unspecified: Secondary | ICD-10-CM

## 2017-06-17 DIAGNOSIS — F0390 Unspecified dementia without behavioral disturbance: Secondary | ICD-10-CM | POA: Diagnosis not present

## 2017-06-17 DIAGNOSIS — F329 Major depressive disorder, single episode, unspecified: Secondary | ICD-10-CM | POA: Diagnosis not present

## 2017-06-17 DIAGNOSIS — Z87440 Personal history of urinary (tract) infections: Secondary | ICD-10-CM | POA: Diagnosis not present

## 2017-06-17 DIAGNOSIS — Z8673 Personal history of transient ischemic attack (TIA), and cerebral infarction without residual deficits: Secondary | ICD-10-CM | POA: Diagnosis not present

## 2017-06-17 DIAGNOSIS — I129 Hypertensive chronic kidney disease with stage 1 through stage 4 chronic kidney disease, or unspecified chronic kidney disease: Secondary | ICD-10-CM | POA: Insufficient documentation

## 2017-06-17 DIAGNOSIS — Z7982 Long term (current) use of aspirin: Secondary | ICD-10-CM | POA: Insufficient documentation

## 2017-06-17 LAB — CBC
HEMATOCRIT: 35.4 % (ref 35.0–47.0)
Hemoglobin: 11.8 g/dL — ABNORMAL LOW (ref 12.0–16.0)
MCH: 29 pg (ref 26.0–34.0)
MCHC: 33.2 g/dL (ref 32.0–36.0)
MCV: 87.3 fL (ref 80.0–100.0)
Platelets: 148 10*3/uL — ABNORMAL LOW (ref 150–440)
RBC: 4.06 MIL/uL (ref 3.80–5.20)
RDW: 15.1 % — AB (ref 11.5–14.5)
WBC: 5 10*3/uL (ref 3.6–11.0)

## 2017-06-17 LAB — LACTATE DEHYDROGENASE: LDH: 126 U/L (ref 98–192)

## 2017-06-17 LAB — RETICULOCYTES
RBC.: 4.1 MIL/uL (ref 3.80–5.20)
RETIC COUNT ABSOLUTE: 16.4 10*3/uL — AB (ref 19.0–183.0)
Retic Ct Pct: 0.4 % (ref 0.4–3.1)

## 2017-06-17 LAB — IRON AND TIBC
IRON: 47 ug/dL (ref 28–170)
SATURATION RATIOS: 21 % (ref 10.4–31.8)
TIBC: 220 ug/dL — AB (ref 250–450)
UIBC: 173 ug/dL

## 2017-06-17 LAB — VITAMIN B12: Vitamin B-12: 1077 pg/mL — ABNORMAL HIGH (ref 180–914)

## 2017-06-17 LAB — DAT, POLYSPECIFIC AHG (ARMC ONLY): Polyspecific AHG test: NEGATIVE

## 2017-06-17 LAB — FOLATE: FOLATE: 9.2 ng/mL (ref >5.9)

## 2017-06-17 LAB — FERRITIN: FERRITIN: 122 ng/mL (ref 11–307)

## 2017-06-17 NOTE — Progress Notes (Signed)
[facility]  [phone] [phone]  ID: Stacey Baker OB: Feb 23, 1932  MR#: [id number]  AOZ#:[id number]  Patient Care Team: Merlene Pulling, PA-C as PCP - General (Physician Assistant) Antonieta Iba, MD as Consulting Physician (Cardiology)  CHIEF COMPLAINT: Anemia secondary to chronic renal failure.  INTERVAL HISTORY: Patient is an 83 year old female who was noted to have a declining hemoglobin secondary to renal insufficiency on routine blood work. She complains of increased weakness and fatigue, but otherwise feels well and that her baseline. She has no neurologic complaints. She denies any recent fevers or illnesses. She has good appetite and denies weight loss. She has no chest pain or shortness of breath. She denies any nausea, vomiting, constipation, or diarrhea. She denies any melena or hematochezia. She has no urinary complaints. She has no hematuria. Patient offers no further specific complaints today.  REVIEW OF SYSTEMS:   Review of Systems  Constitutional: Positive for malaise/fatigue. Negative for fever and weight loss.  Respiratory: Negative.  Negative for cough and shortness of breath.   Cardiovascular: Negative.  Negative for chest pain and leg swelling.  Gastrointestinal: Negative.  Negative for abdominal pain, blood in stool and melena.  Genitourinary: Negative.   Musculoskeletal: Negative.   Skin: Negative.  Negative for rash.  Neurological: Positive for weakness. Negative for sensory change.  Psychiatric/Behavioral: Negative.  The patient is not nervous/anxious.     As per HPI. Otherwise, a complete review of systems is negative.  PAST MEDICAL HISTORY: Past Medical History:  Diagnosis Date  . Apraxia following cerebral infarction   . CKD (chronic kidney disease), stage III    a. With acute worsening/AKI noted on several admissions.  . Dementia   . Dementia without behavioral disturbance   . Depression   .  Encephalopathy   . Hyperkalemia    a. 08/2015  . Hyperlipidemia   . Hyperlipidemia   . Hypertension   . Iron deficiency anemia   . Major depressive disorder   . Muscle weakness   . Parkinson disease (HCC)   . Parkinson's disease (HCC)   . Pericardial effusion    a. 06/2016 Echo: Ef 60-65%, no rwma, Gr1 DD, mild MR, mildly dil LA, PASP , mod circumferential pericardial effusion - no hemodynamic compromise.  . Sinus bradycardia    a. 08/2015 in setting of beta blocker and hyperkalemia.  . Stroke (HCC)   . TIA (transient ischemic attack)    a. 08/2015 - essentially neg w/u for stroke.  Marland Kitchen UTI (urinary tract infection)     PAST SURGICAL HISTORY: Past Surgical History:  Procedure Laterality Date  . ABDOMINAL HYSTERECTOMY    . APPENDECTOMY    . CHOLECYSTECTOMY      FAMILY HISTORY: Family History  Problem Relation Age of Onset  . Heart disease Mother   . Hypertension Other     ADVANCED DIRECTIVES (Y/N):  N  HEALTH MAINTENANCE: Social History  Substance Use Topics  . Smoking status: Never Smoker  . Smokeless tobacco: Never Used  . Alcohol use No     Colonoscopy:  PAP:  Bone density:  Lipid panel:  Allergies  Allergen Reactions  . Penicillins Other (See Comments)    Reaction:  Unknown   . Plavix [Clopidogrel Bisulfate] Other (See Comments)    Reaction:  Unknown     Current Outpatient Prescriptions  Medication Sig Dispense Refill  . acetaminophen (TYLENOL) 325 MG tablet Take 650 mg by mouth every 6 (six) hours as needed.    Marland Kitchen  acidophilus (RISAQUAD) CAPS capsule Take 1 capsule by mouth 2 (two) times daily.    Marland Kitchen amLODipine (NORVASC) 10 MG tablet Take 1 tablet (10 mg total) by mouth daily. 30 tablet 5  . aspirin EC 325 MG tablet Take 1 tablet (325 mg total) by mouth daily. 30 tablet 3  . atorvastatin (LIPITOR) 10 MG tablet Take 10 mg by mouth at bedtime.    . calcitRIOL (ROCALTROL) 0.25 MCG capsule     . diclofenac sodium (VOLTAREN) 1 % GEL Apply 2 g  topically 2 (two) times daily. Pt applies to left knee.    . donepezil (ARICEPT) 10 MG tablet Take 10 mg by mouth daily.    Marland Kitchen escitalopram (LEXAPRO) 10 MG tablet Take 10 mg by mouth daily.    . feeding supplement, ENSURE ENLIVE, (ENSURE ENLIVE) LIQD Take 237 mLs by mouth 2 (two) times daily between meals. 60 Bottle 0  . ferrous sulfate 325 (65 FE) MG tablet Take 325 mg by mouth 2 (two) times daily.    Marland Kitchen FLUTICASONE PROPIONATE EX Apply topically.    Marland Kitchen guaifenesin (ROBITUSSIN) 100 MG/5ML syrup Take 200 mg by mouth 3 (three) times daily as needed for cough.    . levETIRAcetam (KEPPRA) 250 MG tablet Take 250 mg by mouth daily.     Marland Kitchen levofloxacin (LEVAQUIN) 500 MG tablet Take 500 mg by mouth daily.    Marland Kitchen loperamide (IMODIUM) 2 MG capsule Take by mouth as needed for diarrhea or loose stools.    . miconazole (BAZA ANTIFUNGAL) 2 % cream Apply 1 application topically 2 (two) times daily as needed. To sacrum every shift for redness after each toilet    . Multiple Vitamins-Minerals (PRESERVISION AREDS 2 PO) Take 2 capsules by mouth daily.    . Polyethylene Glycol 3350 (MIRALAX PO) Take by mouth.    . QUEtiapine (SEROQUEL) 25 MG tablet Take 25 mg by mouth at bedtime.    . Soft Lens Products (EQ SALINE SOLUTION/SENSITIVE) SOLN Apply 1 application topically every other day. Apply to neck topically once every other day for wound until healed.    . vitamin B-12 (CYANOCOBALAMIN) 1000 MCG tablet Take 1,000 mcg by mouth daily.    . carbidopa-levodopa (SINEMET IR) 25-100 MG tablet Take 1 tablet by mouth 3 (three) times daily.     No current facility-administered medications for this visit.     OBJECTIVE: Vitals:   06/17/17 1122  BP: 109/67  Pulse: 67  Resp: 18  Temp: (!) 95.5 F (35.3 C)     Body mass index is 23.47 kg/m.    ECOG FS:1 - Symptomatic but completely ambulatory  General: Well-developed, well-nourished, no acute distress. Eyes: Pink conjunctiva, anicteric sclera. HEENT: Normocephalic, moist  mucous membranes, clear oropharnyx. Lungs: Clear to auscultation bilaterally. Heart: Regular rate and rhythm. No rubs, murmurs, or gallops. Abdomen: Soft, nontender, nondistended. No organomegaly noted, normoactive bowel sounds. Musculoskeletal: No edema, cyanosis, or clubbing. Neuro: Alert, answering all questions appropriately. Cranial nerves grossly intact. Skin: No rashes or petechiae noted. Psych: Normal affect. Lymphatics: No cervical, calvicular, axillary or inguinal LAD.   LAB RESULTS:  Lab Results  Component Value Date   NA 140 03/09/2017   K 4.5 03/09/2017   CL 110 03/09/2017   CO2 24 03/09/2017   GLUCOSE 87 03/09/2017   BUN 65 (H) 03/09/2017   CREATININE 1.85 (H) 03/09/2017   CALCIUM 8.0 (L) 03/09/2017   PROT 6.8 03/01/2017   ALBUMIN 3.5 03/01/2017   AST 18 03/01/2017   ALT <  5 (L) 03/01/2017   ALKPHOS 94 03/01/2017   BILITOT 0.5 03/01/2017   GFRNONAA 24 (L) 03/09/2017   GFRAA 28 (L) 03/09/2017    Lab Results  Component Value Date   WBC 5.0 06/17/2017   NEUTROABS 4.6 02/26/2017   HGB 11.8 (L) 06/17/2017   HCT 35.4 06/17/2017   MCV 87.3 06/17/2017   PLT 148 (L) 06/17/2017   Lab Results  Component Value Date   IRON 47 06/17/2017   TIBC 220 (L) 06/17/2017   IRONPCTSAT 21 06/17/2017   Lab Results  Component Value Date   FERRITIN 122 06/17/2017     STUDIES: No results found.  ASSESSMENT: Anemia secondary to chronic renal failure.  PLAN:    1. Anemia secondary to chronic renal failure: Patient's hemoglobin has significantly improved to 11.8. Her iron stores and folate levels are also within normal limits. She has no evidence of hemolysis. Her reticulocyte count is appropriately normal. The remainder of her laboratory work is pending at time of dictation. Patient does not require Procrit at this time, but would consider if her hemoglobin falls below 10.0. No intervention is needed. Return to clinic in 1 month with repeat laboratory work and further  evaluation. 2. Chronic renal failure: Continue monitoring and management per nephrology.  Approximately 45 minutes was spent in discussion of which greater than 50% was consultation.  Patient expressed understanding and was in agreement with this plan. She also understands that She can call clinic at any time with any questions, concerns, or complaints.    Jeralyn Ruthsimothy J Tyden Kann, MD   06/17/2017 2:11 PM

## 2017-06-18 LAB — ERYTHROPOIETIN: ERYTHROPOIETIN: 6.8 m[IU]/mL (ref 2.6–18.5)

## 2017-06-18 LAB — HAPTOGLOBIN: HAPTOGLOBIN: 90 mg/dL (ref 34–200)

## 2017-06-24 ENCOUNTER — Ambulatory Visit

## 2017-07-14 NOTE — Progress Notes (Deleted)
[facility]  [phone] [phone]  ID: Stacey Baker OB: Feb 21, 1932  MR#: [id number]  UUV#:[id number]  Patient Care Team: Merlene Pulling, PA-C as PCP - General (Physician Assistant) Antonieta Iba, MD as Consulting Physician (Cardiology)  CHIEF COMPLAINT: Anemia secondary to chronic renal failure.  INTERVAL HISTORY: Patient is an 83 year old female who was noted to have a declining hemoglobin secondary to renal insufficiency on routine blood work. She complains of increased weakness and fatigue, but otherwise feels well and that her baseline. She has no neurologic complaints. She denies any recent fevers or illnesses. She has good appetite and denies weight loss. She has no chest pain or shortness of breath. She denies any nausea, vomiting, constipation, or diarrhea. She denies any melena or hematochezia. She has no urinary complaints. She has no hematuria. Patient offers no further specific complaints today.  REVIEW OF SYSTEMS:   Review of Systems  Constitutional: Positive for malaise/fatigue. Negative for fever and weight loss.  Respiratory: Negative.  Negative for cough and shortness of breath.   Cardiovascular: Negative.  Negative for chest pain and leg swelling.  Gastrointestinal: Negative.  Negative for abdominal pain, blood in stool and melena.  Genitourinary: Negative.   Musculoskeletal: Negative.   Skin: Negative.  Negative for rash.  Neurological: Positive for weakness. Negative for sensory change.  Psychiatric/Behavioral: Negative.  The patient is not nervous/anxious.     As per HPI. Otherwise, a complete review of systems is negative.  PAST MEDICAL HISTORY: Past Medical History:  Diagnosis Date  . Apraxia following cerebral infarction   . CKD (chronic kidney disease), stage III    a. With acute worsening/AKI noted on several admissions.  . Dementia   . Dementia without behavioral disturbance   . Depression   .  Encephalopathy   . Hyperkalemia    a. 08/2015  . Hyperlipidemia   . Hyperlipidemia   . Hypertension   . Iron deficiency anemia   . Major depressive disorder   . Muscle weakness   . Parkinson disease (HCC)   . Parkinson's disease (HCC)   . Pericardial effusion    a. 06/2016 Echo: Ef 60-65%, no rwma, Gr1 DD, mild MR, mildly dil LA, PASP , mod circumferential pericardial effusion - no hemodynamic compromise.  . Sinus bradycardia    a. 08/2015 in setting of beta blocker and hyperkalemia.  . Stroke (HCC)   . TIA (transient ischemic attack)    a. 08/2015 - essentially neg w/u for stroke.  Marland Kitchen UTI (urinary tract infection)     PAST SURGICAL HISTORY: Past Surgical History:  Procedure Laterality Date  . ABDOMINAL HYSTERECTOMY    . APPENDECTOMY    . CHOLECYSTECTOMY      FAMILY HISTORY: Family History  Problem Relation Age of Onset  . Heart disease Mother   . Hypertension Other     ADVANCED DIRECTIVES (Y/N):  N  HEALTH MAINTENANCE: Social History  Substance Use Topics  . Smoking status: Never Smoker  . Smokeless tobacco: Never Used  . Alcohol use No     Colonoscopy:  PAP:  Bone density:  Lipid panel:  Allergies  Allergen Reactions  . Penicillins Other (See Comments)    Reaction:  Unknown   . Plavix [Clopidogrel Bisulfate] Other (See Comments)    Reaction:  Unknown     Current Outpatient Prescriptions  Medication Sig Dispense Refill  . acetaminophen (TYLENOL) 325 MG tablet Take 650 mg by mouth every 6 (six) hours as needed.    Marland Kitchen  acidophilus (RISAQUAD) CAPS capsule Take 1 capsule by mouth 2 (two) times daily.    Marland Kitchen amLODipine (NORVASC) 10 MG tablet Take 1 tablet (10 mg total) by mouth daily. 30 tablet 5  . aspirin EC 325 MG tablet Take 1 tablet (325 mg total) by mouth daily. 30 tablet 3  . atorvastatin (LIPITOR) 10 MG tablet Take 10 mg by mouth at bedtime.    . calcitRIOL (ROCALTROL) 0.25 MCG capsule     . carbidopa-levodopa (SINEMET IR) 25-100 MG tablet Take 1  tablet by mouth 3 (three) times daily.    . diclofenac sodium (VOLTAREN) 1 % GEL Apply 2 g topically 2 (two) times daily. Pt applies to left knee.    . donepezil (ARICEPT) 10 MG tablet Take 10 mg by mouth daily.    Marland Kitchen escitalopram (LEXAPRO) 10 MG tablet Take 10 mg by mouth daily.    . feeding supplement, ENSURE ENLIVE, (ENSURE ENLIVE) LIQD Take 237 mLs by mouth 2 (two) times daily between meals. 60 Bottle 0  . ferrous sulfate 325 (65 FE) MG tablet Take 325 mg by mouth 2 (two) times daily.    Marland Kitchen FLUTICASONE PROPIONATE EX Apply topically.    Marland Kitchen guaifenesin (ROBITUSSIN) 100 MG/5ML syrup Take 200 mg by mouth 3 (three) times daily as needed for cough.    . levETIRAcetam (KEPPRA) 250 MG tablet Take 250 mg by mouth daily.     Marland Kitchen levofloxacin (LEVAQUIN) 500 MG tablet Take 500 mg by mouth daily.    Marland Kitchen loperamide (IMODIUM) 2 MG capsule Take by mouth as needed for diarrhea or loose stools.    . miconazole (BAZA ANTIFUNGAL) 2 % cream Apply 1 application topically 2 (two) times daily as needed. To sacrum every shift for redness after each toilet    . Multiple Vitamins-Minerals (PRESERVISION AREDS 2 PO) Take 2 capsules by mouth daily.    . Polyethylene Glycol 3350 (MIRALAX PO) Take by mouth.    . QUEtiapine (SEROQUEL) 25 MG tablet Take 25 mg by mouth at bedtime.    . Soft Lens Products (EQ SALINE SOLUTION/SENSITIVE) SOLN Apply 1 application topically every other day. Apply to neck topically once every other day for wound until healed.    . vitamin B-12 (CYANOCOBALAMIN) 1000 MCG tablet Take 1,000 mcg by mouth daily.     No current facility-administered medications for this visit.     OBJECTIVE: There were no vitals filed for this visit.   There is no height or weight on file to calculate BMI.    ECOG FS:1 - Symptomatic but completely ambulatory  General: Well-developed, well-nourished, no acute distress. Eyes: Pink conjunctiva, anicteric sclera. HEENT: Normocephalic, moist mucous membranes, clear  oropharnyx. Lungs: Clear to auscultation bilaterally. Heart: Regular rate and rhythm. No rubs, murmurs, or gallops. Abdomen: Soft, nontender, nondistended. No organomegaly noted, normoactive bowel sounds. Musculoskeletal: No edema, cyanosis, or clubbing. Neuro: Alert, answering all questions appropriately. Cranial nerves grossly intact. Skin: No rashes or petechiae noted. Psych: Normal affect. Lymphatics: No cervical, calvicular, axillary or inguinal LAD.   LAB RESULTS:  Lab Results  Component Value Date   NA 140 03/09/2017   K 4.5 03/09/2017   CL 110 03/09/2017   CO2 24 03/09/2017   GLUCOSE 87 03/09/2017   BUN 65 (H) 03/09/2017   CREATININE 1.85 (H) 03/09/2017   CALCIUM 8.0 (L) 03/09/2017   PROT 6.8 03/01/2017   ALBUMIN 3.5 03/01/2017   AST 18 03/01/2017   ALT <5 (L) 03/01/2017   ALKPHOS 94 03/01/2017  BILITOT 0.5 03/01/2017   GFRNONAA 24 (L) 03/09/2017   GFRAA 28 (L) 03/09/2017    Lab Results  Component Value Date   WBC 5.0 06/17/2017   NEUTROABS 4.6 02/26/2017   HGB 11.8 (L) 06/17/2017   HCT 35.4 06/17/2017   MCV 87.3 06/17/2017   PLT 148 (L) 06/17/2017   Lab Results  Component Value Date   IRON 47 06/17/2017   TIBC 220 (L) 06/17/2017   IRONPCTSAT 21 06/17/2017   Lab Results  Component Value Date   FERRITIN 122 06/17/2017     STUDIES: No results found.  ASSESSMENT: Anemia secondary to chronic renal failure.  PLAN:    1. Anemia secondary to chronic renal failure: Patient's hemoglobin has significantly improved to 11.8. Her iron stores and folate levels are also within normal limits. She has no evidence of hemolysis. Her reticulocyte count is appropriately normal. The remainder of her laboratory work is pending at time of dictation. Patient does not require Procrit at this time, but would consider if her hemoglobin falls below 10.0. No intervention is needed. Return to clinic in 1 month with repeat laboratory work and further evaluation. 2. Chronic  renal failure: Continue monitoring and management per nephrology.  Approximately 45 minutes was spent in discussion of which greater than 50% was consultation.  Patient expressed understanding and was in agreement with this plan. She also understands that She can call clinic at any time with any questions, concerns, or complaints.    Jeralyn Ruths, MD   07/14/2017 11:15 PM

## 2017-07-15 ENCOUNTER — Inpatient Hospital Stay: Admitting: Oncology

## 2017-07-15 ENCOUNTER — Inpatient Hospital Stay

## 2017-07-20 NOTE — Progress Notes (Signed)
[facility]  [phone] [phone]  ID: Stacey Baker OB: 1932-05-30  MR#: [id number]  WUX#:[id number]  Patient Care Team: Merlene Pulling, PA-C as PCP - General (Physician Assistant) Antonieta Iba, MD as Consulting Physician (Cardiology)  CHIEF COMPLAINT: Anemia secondary to chronic renal failure.  INTERVAL HISTORY: Patient returns to clinic today for repeat laboratory work and further evaluation. She currently feels well and is asymptomatic. She has no neurologic complaints. She denies any recent fevers or illnesses. She has a good appetite and denies weight loss. She has no chest pain or shortness of breath. She denies any nausea, vomiting, constipation, or diarrhea. She denies any melena or hematochezia. She has no urinary complaints. She has no hematuria. Patient offers no further specific complaints today.  REVIEW OF SYSTEMS:   Review of Systems  Constitutional: Negative.  Negative for fever, malaise/fatigue and weight loss.  Respiratory: Negative.  Negative for cough and shortness of breath.   Cardiovascular: Negative.  Negative for chest pain and leg swelling.  Gastrointestinal: Negative.  Negative for abdominal pain, blood in stool and melena.  Genitourinary: Negative.  Negative for hematuria.  Musculoskeletal: Negative.   Skin: Negative.  Negative for rash.  Neurological: Negative.  Negative for sensory change and weakness.  Psychiatric/Behavioral: Negative.  The patient is not nervous/anxious.     As per HPI. Otherwise, a complete review of systems is negative.  PAST MEDICAL HISTORY: Past Medical History:  Diagnosis Date  . Apraxia following cerebral infarction   . CKD (chronic kidney disease), stage III    a. With acute worsening/AKI noted on several admissions.  . Dementia   . Dementia without behavioral disturbance   . Depression   . Encephalopathy   . Hyperkalemia    a. 08/2015  . Hyperlipidemia   .  Hyperlipidemia   . Hypertension   . Iron deficiency anemia   . Major depressive disorder   . Muscle weakness   . Parkinson disease (HCC)   . Parkinson's disease (HCC)   . Pericardial effusion    a. 06/2016 Echo: Ef 60-65%, no rwma, Gr1 DD, mild MR, mildly dil LA, PASP , mod circumferential pericardial effusion - no hemodynamic compromise.  . Sinus bradycardia    a. 08/2015 in setting of beta blocker and hyperkalemia.  . Stroke (HCC)   . TIA (transient ischemic attack)    a. 08/2015 - essentially neg w/u for stroke.  Marland Kitchen UTI (urinary tract infection)     PAST SURGICAL HISTORY: Past Surgical History:  Procedure Laterality Date  . ABDOMINAL HYSTERECTOMY    . APPENDECTOMY    . CHOLECYSTECTOMY      FAMILY HISTORY: Family History  Problem Relation Age of Onset  . Heart disease Mother   . Hypertension Other     ADVANCED DIRECTIVES (Y/N):  N  HEALTH MAINTENANCE: Social History  Substance Use Topics  . Smoking status: Never Smoker  . Smokeless tobacco: Never Used  . Alcohol use No     Colonoscopy:  PAP:  Bone density:  Lipid panel:  Allergies  Allergen Reactions  . Penicillins Other (See Comments)    Reaction:  Unknown   . Plavix [Clopidogrel Bisulfate] Other (See Comments)    Reaction:  Unknown     Current Outpatient Prescriptions  Medication Sig Dispense Refill  . acetaminophen (TYLENOL) 325 MG tablet Take 650 mg by mouth every 6 (six) hours as needed.    Marland Kitchen acidophilus (RISAQUAD) CAPS capsule Take 1 capsule by mouth 2 (two) times  daily.    . aspirin EC 325 MG tablet Take 1 tablet (325 mg total) by mouth daily. 30 tablet 3  . calcitRIOL (ROCALTROL) 0.25 MCG capsule     . carbidopa-levodopa (SINEMET IR) 25-100 MG tablet Take 1 tablet by mouth 3 (three) times daily.    . diclofenac sodium (VOLTAREN) 1 % GEL Apply 2 g topically 2 (two) times daily. Pt applies to left knee.    . donepezil (ARICEPT) 10 MG tablet Take 10 mg by mouth daily.    Marland Kitchen FLUTICASONE  PROPIONATE EX Apply topically.    . levETIRAcetam (KEPPRA) 250 MG tablet Take 250 mg by mouth daily.     Marland Kitchen loperamide (IMODIUM) 2 MG capsule Take by mouth as needed for diarrhea or loose stools.    . Multiple Vitamins-Minerals (PRESERVISION AREDS 2 PO) Take 2 capsules by mouth daily.    . Polyethylene Glycol 3350 (MIRALAX PO) Take by mouth.    . QUEtiapine (SEROQUEL) 25 MG tablet Take 25 mg by mouth at bedtime.    . Soft Lens Products (EQ SALINE SOLUTION/SENSITIVE) SOLN Apply 1 application topically every other day. Apply to neck topically once every other day for wound until healed.    . vitamin B-12 (CYANOCOBALAMIN) 1000 MCG tablet Take 1,000 mcg by mouth daily.    Marland Kitchen amLODipine (NORVASC) 10 MG tablet Take 1 tablet (10 mg total) by mouth daily. (Patient not taking: Reported on 07/22/2017) 30 tablet 5  . atorvastatin (LIPITOR) 10 MG tablet Take 10 mg by mouth at bedtime.    Marland Kitchen escitalopram (LEXAPRO) 10 MG tablet Take 10 mg by mouth daily.    . feeding supplement, ENSURE ENLIVE, (ENSURE ENLIVE) LIQD Take 237 mLs by mouth 2 (two) times daily between meals. (Patient not taking: Reported on 07/22/2017) 60 Bottle 0  . ferrous sulfate 325 (65 FE) MG tablet Take 325 mg by mouth 2 (two) times daily.    Marland Kitchen guaifenesin (ROBITUSSIN) 100 MG/5ML syrup Take 200 mg by mouth 3 (three) times daily as needed for cough.    Marland Kitchen levofloxacin (LEVAQUIN) 500 MG tablet Take 500 mg by mouth daily.    . miconazole (BAZA ANTIFUNGAL) 2 % cream Apply 1 application topically 2 (two) times daily as needed. To sacrum every shift for redness after each toilet     No current facility-administered medications for this visit.     OBJECTIVE: Vitals:   07/22/17 1156  BP: 122/62  Pulse: 61  Resp: 18  Temp: (!) 95.4 F (35.2 C)     There is no height or weight on file to calculate BMI.    ECOG FS:1 - Symptomatic but completely ambulatory  General: Well-developed, well-nourished, no acute distress. Eyes: Pink conjunctiva, anicteric  sclera. Lungs: Clear to auscultation bilaterally. Heart: Regular rate and rhythm. No rubs, murmurs, or gallops. Abdomen: Soft, nontender, nondistended. No organomegaly noted, normoactive bowel sounds. Musculoskeletal: No edema, cyanosis, or clubbing. Neuro: Alert, answering all questions appropriately. Cranial nerves grossly intact. Skin: No rashes or petechiae noted. Psych: Normal affect.   LAB RESULTS:  Lab Results  Component Value Date   NA 140 03/09/2017   K 4.5 03/09/2017   CL 110 03/09/2017   CO2 24 03/09/2017   GLUCOSE 87 03/09/2017   BUN 65 (H) 03/09/2017   CREATININE 1.85 (H) 03/09/2017   CALCIUM 8.0 (L) 03/09/2017   PROT 6.8 03/01/2017   ALBUMIN 3.5 03/01/2017   AST 18 03/01/2017   ALT <5 (L) 03/01/2017   ALKPHOS 94 03/01/2017  BILITOT 0.5 03/01/2017   GFRNONAA 24 (L) 03/09/2017   GFRAA 28 (L) 03/09/2017    Lab Results  Component Value Date   WBC 5.7 07/22/2017   NEUTROABS 3.3 07/22/2017   HGB 11.4 (L) 07/22/2017   HCT 33.7 (L) 07/22/2017   MCV 85.0 07/22/2017   PLT 170 07/22/2017   Lab Results  Component Value Date   IRON 47 06/17/2017   TIBC 220 (L) 06/17/2017   IRONPCTSAT 21 06/17/2017   Lab Results  Component Value Date   FERRITIN 122 06/17/2017     STUDIES: No results found.  ASSESSMENT: Anemia secondary to chronic renal failure.  PLAN:    1. Anemia secondary to chronic renal failure: Patient's hemoglobin Remains slightly decreased, but still greater than 11.0. All of her other laboratory work including iron stores, B-12, folate, and hemolysis labs are within normal limits. Patient does not require Procrit at this time, but would consider if her hemoglobin falls below 10.0. No intervention is needed. After lengthy discussion with the patient, it was agreed upon the no further follow-up is necessary. Please refer her back if there are any questions or concerns or her hemoglobin remains persistently below 10.0.  2. Chronic renal failure:  Continue monitoring and management per nephrology.  Approximately 20 minutes was spent in discussion of which greater than 50% was consultation.  Patient expressed understanding and was in agreement with this plan. She also understands that She can call clinic at any time with any questions, concerns, or complaints.    Jeralyn Ruths, MD   07/24/2017 9:40 AM

## 2017-07-22 ENCOUNTER — Inpatient Hospital Stay (HOSPITAL_BASED_OUTPATIENT_CLINIC_OR_DEPARTMENT_OTHER): Admitting: Oncology

## 2017-07-22 ENCOUNTER — Inpatient Hospital Stay

## 2017-07-22 ENCOUNTER — Inpatient Hospital Stay: Attending: Oncology

## 2017-07-22 VITALS — BP 122/62 | HR 61 | Temp 95.4°F | Resp 18

## 2017-07-22 DIAGNOSIS — I129 Hypertensive chronic kidney disease with stage 1 through stage 4 chronic kidney disease, or unspecified chronic kidney disease: Secondary | ICD-10-CM | POA: Diagnosis not present

## 2017-07-22 DIAGNOSIS — E785 Hyperlipidemia, unspecified: Secondary | ICD-10-CM | POA: Insufficient documentation

## 2017-07-22 DIAGNOSIS — R482 Apraxia: Secondary | ICD-10-CM | POA: Diagnosis not present

## 2017-07-22 DIAGNOSIS — G20 Parkinson's disease: Secondary | ICD-10-CM | POA: Diagnosis not present

## 2017-07-22 DIAGNOSIS — Z7982 Long term (current) use of aspirin: Secondary | ICD-10-CM | POA: Insufficient documentation

## 2017-07-22 DIAGNOSIS — F329 Major depressive disorder, single episode, unspecified: Secondary | ICD-10-CM

## 2017-07-22 DIAGNOSIS — N183 Chronic kidney disease, stage 3 unspecified: Secondary | ICD-10-CM

## 2017-07-22 DIAGNOSIS — Z79899 Other long term (current) drug therapy: Secondary | ICD-10-CM | POA: Diagnosis not present

## 2017-07-22 DIAGNOSIS — D631 Anemia in chronic kidney disease: Secondary | ICD-10-CM | POA: Insufficient documentation

## 2017-07-22 DIAGNOSIS — E875 Hyperkalemia: Secondary | ICD-10-CM

## 2017-07-22 DIAGNOSIS — F0390 Unspecified dementia without behavioral disturbance: Secondary | ICD-10-CM | POA: Insufficient documentation

## 2017-07-22 DIAGNOSIS — Z8673 Personal history of transient ischemic attack (TIA), and cerebral infarction without residual deficits: Secondary | ICD-10-CM | POA: Insufficient documentation

## 2017-07-22 DIAGNOSIS — D696 Thrombocytopenia, unspecified: Secondary | ICD-10-CM

## 2017-07-22 LAB — CBC WITH DIFFERENTIAL/PLATELET
BASOS ABS: 0 10*3/uL (ref 0–0.1)
Basophils Relative: 1 %
Eosinophils Absolute: 0.3 10*3/uL (ref 0–0.7)
Eosinophils Relative: 6 %
HEMATOCRIT: 33.7 % — AB (ref 35.0–47.0)
Hemoglobin: 11.4 g/dL — ABNORMAL LOW (ref 12.0–16.0)
LYMPHS ABS: 1.5 10*3/uL (ref 1.0–3.6)
LYMPHS PCT: 26 %
MCH: 28.7 pg (ref 26.0–34.0)
MCHC: 33.7 g/dL (ref 32.0–36.0)
MCV: 85 fL (ref 80.0–100.0)
MONO ABS: 0.6 10*3/uL (ref 0.2–0.9)
MONOS PCT: 11 %
NEUTROS ABS: 3.3 10*3/uL (ref 1.4–6.5)
Neutrophils Relative %: 56 %
Platelets: 170 10*3/uL (ref 150–440)
RBC: 3.97 MIL/uL (ref 3.80–5.20)
RDW: 14.9 % — AB (ref 11.5–14.5)
WBC: 5.7 10*3/uL (ref 3.6–11.0)

## 2017-07-22 NOTE — Progress Notes (Signed)
Patient denies any concerns today.  

## 2017-11-16 IMAGING — CR DG CHEST 2V
2 series · 2 of 2 positions shown · non-contrast
Comparison: 08/27/2015 and 01/12/2015

CLINICAL DATA: Weakness.  Lethargy.  Multiple bug bites.

EXAM:
CHEST  2 VIEW

[chest lat]
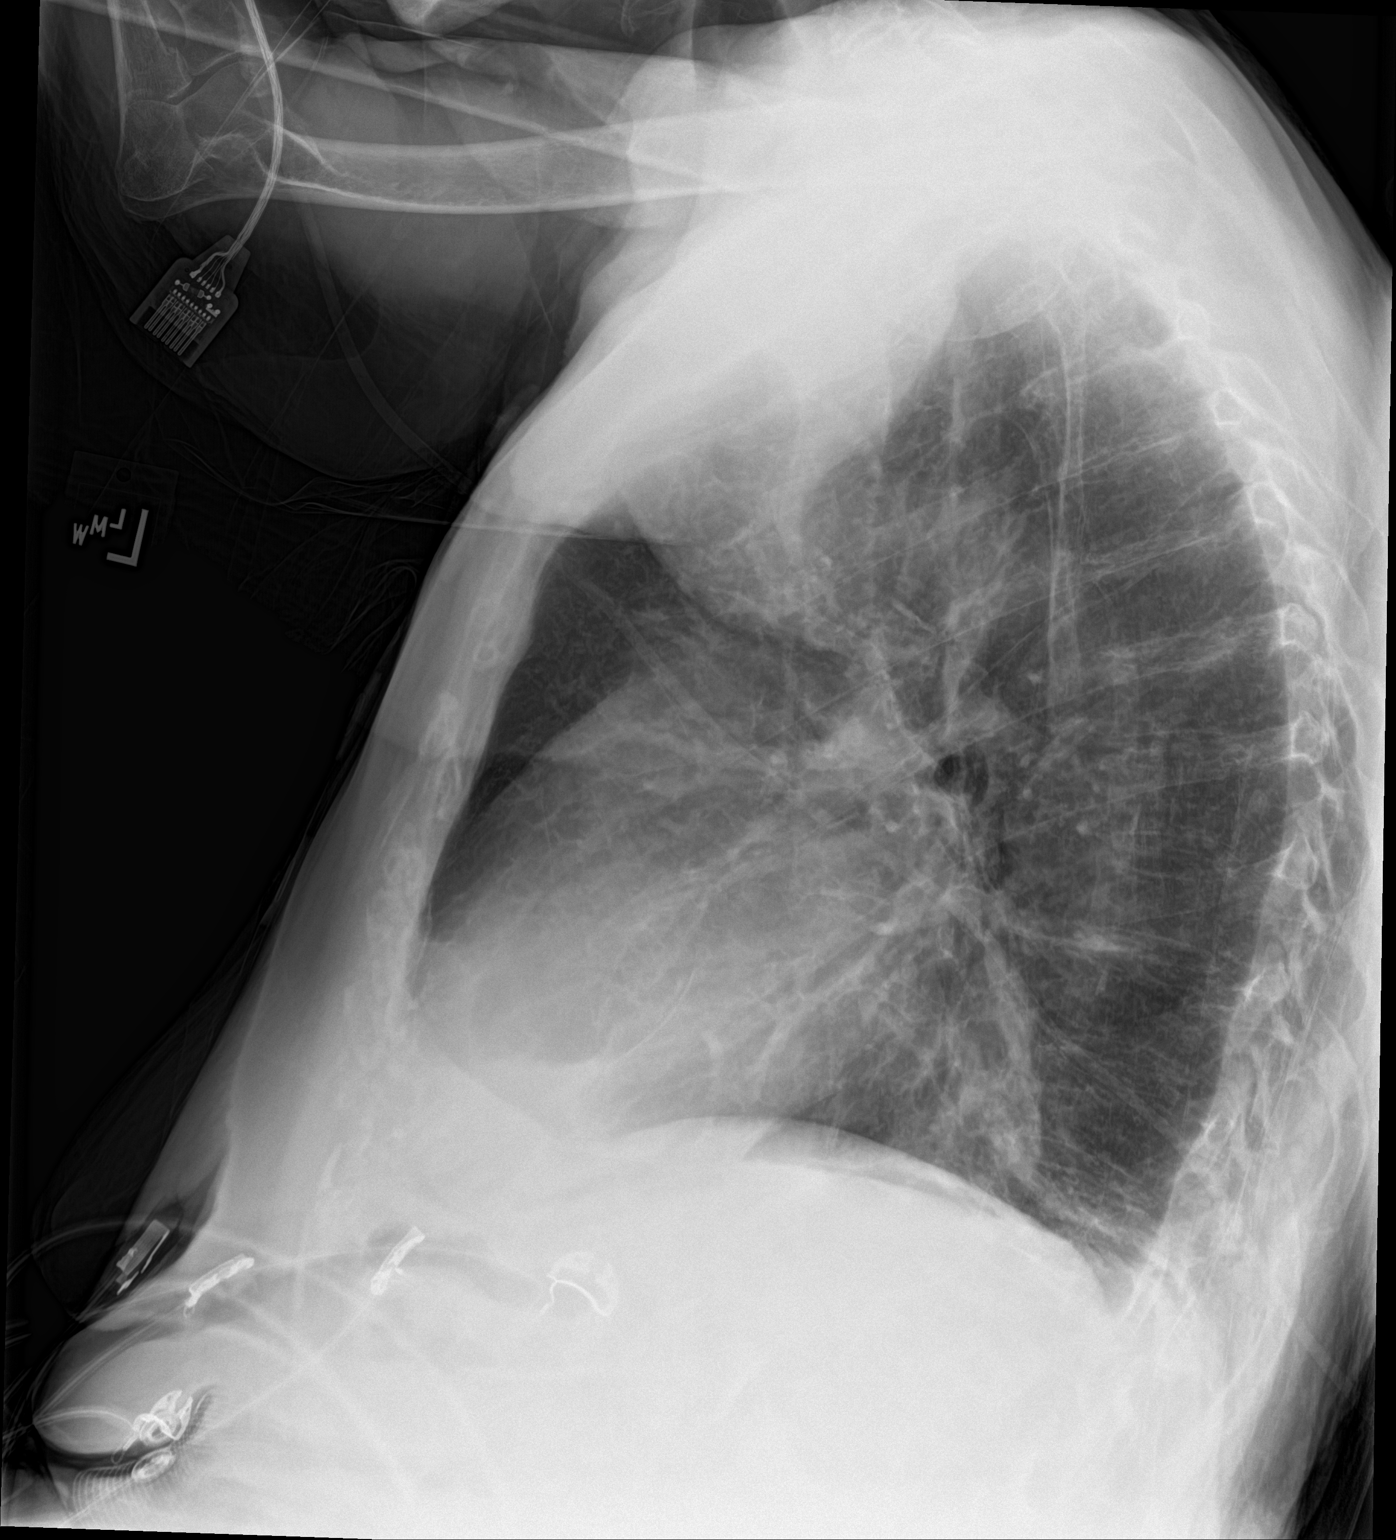

[chest ap]
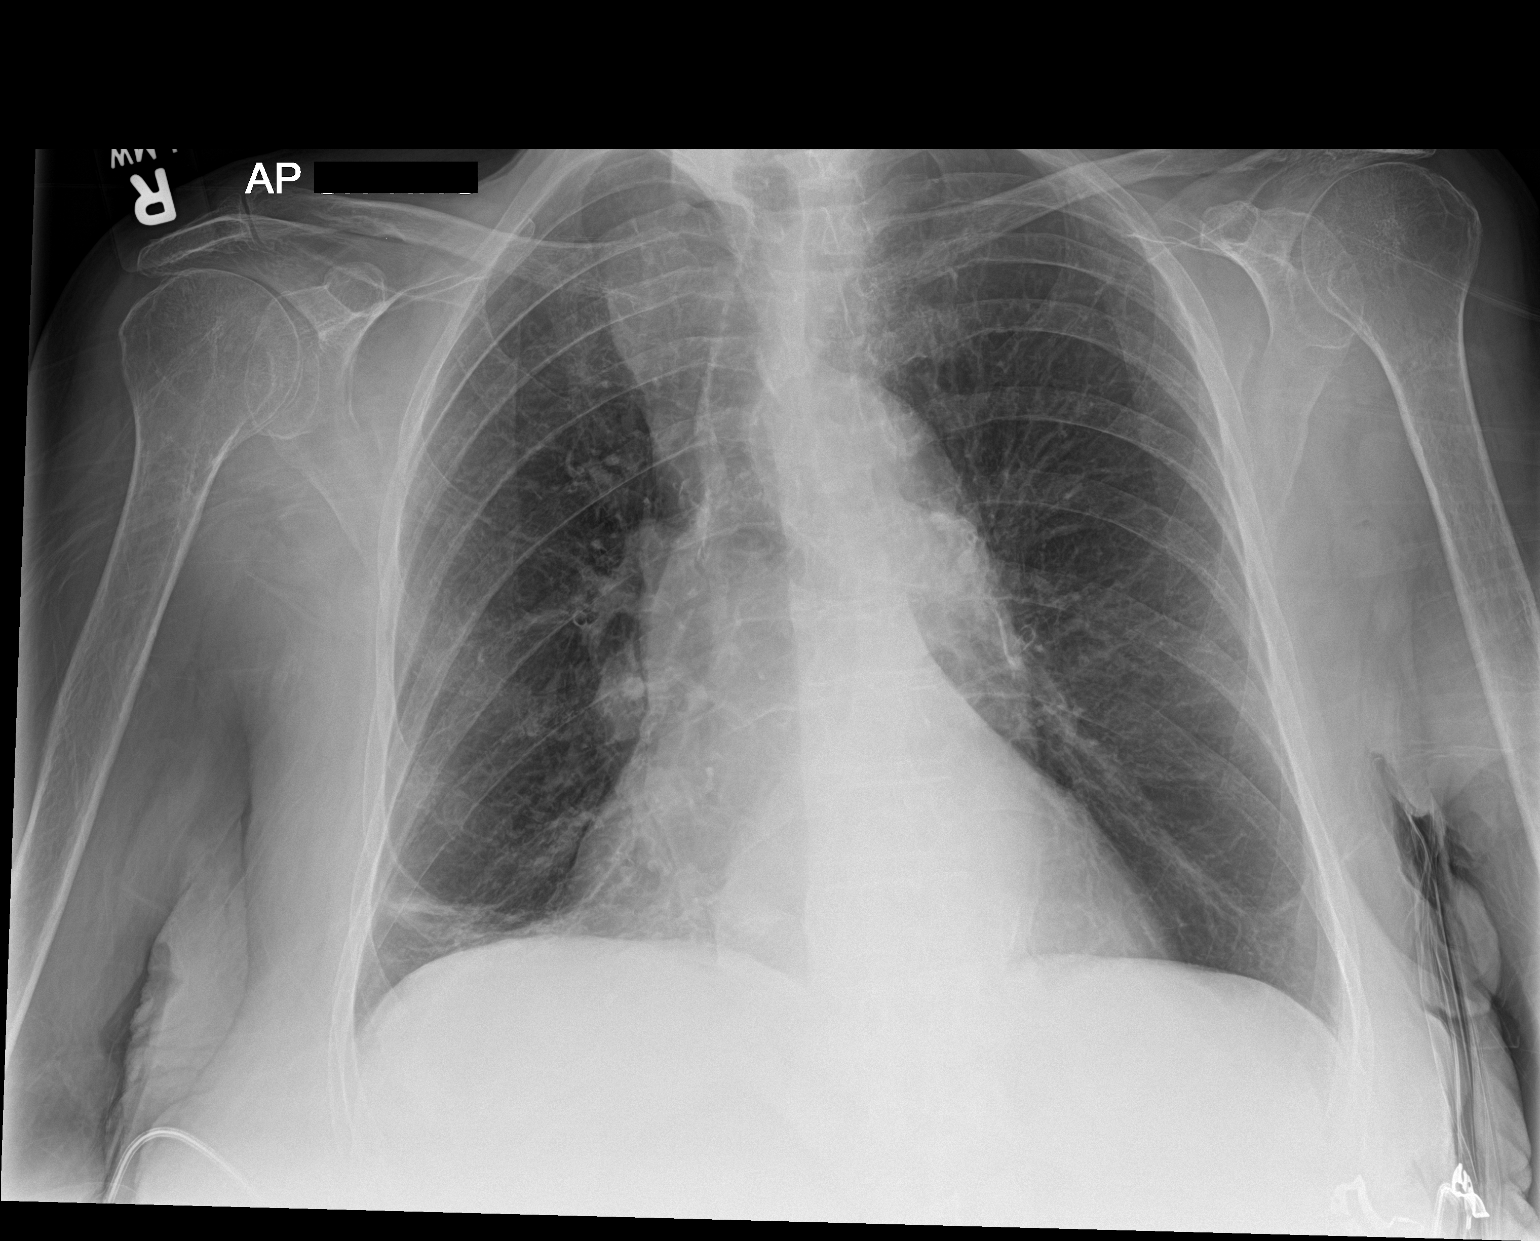

[2 of 2 positions shown; findings below may reference images not displayed]

FINDINGS: Mild cardiomegaly and ectasia thoracic aorta are stable. Bibasilar
scarring is again seen, right side greater than left. No evidence of
pulmonary infiltrate or pleural effusion. Pulmonary hyperinflation
again noted, suspicious COPD. Small hiatal hernia again noted.
IMPRESSION: Probable COPD and bibasilar scarring, without significant change.

Stable cardiomegaly and small hiatal hernia.

## 2017-12-08 ENCOUNTER — Emergency Department

## 2017-12-08 ENCOUNTER — Encounter: Payer: Self-pay | Admitting: *Deleted

## 2017-12-08 ENCOUNTER — Emergency Department
Admission: EM | Admit: 2017-12-08 | Discharge: 2017-12-09 | Disposition: A | Discharge status: Skilled Nursing Facility | Attending: Emergency Medicine | Admitting: Emergency Medicine

## 2017-12-08 ENCOUNTER — Other Ambulatory Visit: Payer: Self-pay

## 2017-12-08 DIAGNOSIS — Z79899 Other long term (current) drug therapy: Secondary | ICD-10-CM | POA: Diagnosis not present

## 2017-12-08 DIAGNOSIS — Z8673 Personal history of transient ischemic attack (TIA), and cerebral infarction without residual deficits: Secondary | ICD-10-CM | POA: Diagnosis not present

## 2017-12-08 DIAGNOSIS — N183 Chronic kidney disease, stage 3 (moderate): Secondary | ICD-10-CM | POA: Diagnosis not present

## 2017-12-08 DIAGNOSIS — I129 Hypertensive chronic kidney disease with stage 1 through stage 4 chronic kidney disease, or unspecified chronic kidney disease: Secondary | ICD-10-CM | POA: Diagnosis not present

## 2017-12-08 DIAGNOSIS — R918 Other nonspecific abnormal finding of lung field: Secondary | ICD-10-CM | POA: Diagnosis not present

## 2017-12-08 DIAGNOSIS — F0390 Unspecified dementia without behavioral disturbance: Secondary | ICD-10-CM | POA: Diagnosis not present

## 2017-12-08 DIAGNOSIS — G20 Parkinson's disease: Secondary | ICD-10-CM | POA: Insufficient documentation

## 2017-12-08 DIAGNOSIS — R531 Weakness: Secondary | ICD-10-CM

## 2017-12-08 LAB — URINALYSIS, COMPLETE (UACMP) WITH MICROSCOPIC
BILIRUBIN URINE: NEGATIVE
Bacteria, UA: NONE SEEN
Glucose, UA: NEGATIVE mg/dL
HGB URINE DIPSTICK: NEGATIVE
Ketones, ur: NEGATIVE mg/dL
Leukocytes, UA: NEGATIVE
NITRITE: NEGATIVE
PH: 5 (ref 5.0–8.0)
Protein, ur: NEGATIVE mg/dL
SPECIFIC GRAVITY, URINE: 1.01 (ref 1.005–1.030)
Squamous Epithelial / LPF: NONE SEEN

## 2017-12-08 LAB — CBC WITH DIFFERENTIAL/PLATELET
BASOS ABS: 0 10*3/uL (ref 0–0.1)
Basophils Relative: 1 %
EOS ABS: 0.2 10*3/uL (ref 0–0.7)
EOS PCT: 4 %
HCT: 34.2 % — ABNORMAL LOW (ref 35.0–47.0)
Hemoglobin: 11.4 g/dL — ABNORMAL LOW (ref 12.0–16.0)
LYMPHS PCT: 39 %
Lymphs Abs: 1.9 10*3/uL (ref 1.0–3.6)
MCH: 29.1 pg (ref 26.0–34.0)
MCHC: 33.3 g/dL (ref 32.0–36.0)
MCV: 87.3 fL (ref 80.0–100.0)
MONO ABS: 0.4 10*3/uL (ref 0.2–0.9)
Monocytes Relative: 8 %
Neutro Abs: 2.4 10*3/uL (ref 1.4–6.5)
Neutrophils Relative %: 48 %
PLATELETS: 148 10*3/uL — AB (ref 150–440)
RBC: 3.91 MIL/uL (ref 3.80–5.20)
RDW: 15.4 % — ABNORMAL HIGH (ref 11.5–14.5)
WBC: 5 10*3/uL (ref 3.6–11.0)

## 2017-12-08 LAB — COMPREHENSIVE METABOLIC PANEL
AST: 16 U/L (ref 15–41)
Albumin: 3.5 g/dL (ref 3.5–5.0)
Alkaline Phosphatase: 76 U/L (ref 38–126)
Anion gap: 9 (ref 5–15)
BUN: 38 mg/dL — ABNORMAL HIGH (ref 6–20)
CALCIUM: 8.5 mg/dL — AB (ref 8.9–10.3)
CHLORIDE: 110 mmol/L (ref 101–111)
CO2: 21 mmol/L — ABNORMAL LOW (ref 22–32)
CREATININE: 1.67 mg/dL — AB (ref 0.44–1.00)
GFR, EST AFRICAN AMERICAN: 32 mL/min — AB (ref >60)
GFR, EST NON AFRICAN AMERICAN: 27 mL/min — AB (ref >60)
Glucose, Bld: 91 mg/dL (ref 65–99)
Potassium: 4.6 mmol/L (ref 3.5–5.1)
Sodium: 140 mmol/L (ref 135–145)
Total Bilirubin: 0.6 mg/dL (ref 0.3–1.2)
Total Protein: 6.6 g/dL (ref 6.5–8.1)

## 2017-12-08 LAB — PROTIME-INR
INR: 1.05
PROTHROMBIN TIME: 13.6 s (ref 11.4–15.2)

## 2017-12-08 LAB — TROPONIN I

## 2017-12-08 MED ORDER — AZITHROMYCIN 250 MG PO TABS: ORAL_TABLET | ORAL | 0 refills | Status: DC

## 2017-12-08 NOTE — ED Triage Notes (Signed)
[facility]. Upon EMS arrival pt c/o weakness and generally feeling bad. The staff stated she had Right sided weakness and the pt states she is always wek on her right. Grips equal and answers questions appropriately

## 2017-12-08 NOTE — Discharge Instructions (Signed)
There is a questionable early pneumonia on chest x-ray, however, clinically there is not much to back up the diagnosis of pneumonia.  Nonetheless, we will give you antibiotics.  If there is any new or worrisome symptoms including cough, shortness of breath, fever, weakness, or other concerns return to the emergency department.  Follow-up closely with Dr. tomorrow

## 2017-12-08 NOTE — ED Provider Notes (Addendum)
[facility] Emergency Department Provider Note  ____________________________________________   I have reviewed the triage vital signs and the nursing notes. Where available I have reviewed prior notes and, if possible and indicated, outside hospital notes.    HISTORY  Chief Complaint Weakness    HPI Stacey ManchesterMiriam Clyatt is a 84 y.o. female who has a remote history of CVA with right-sided weakness she states, patient was found by nursing home staff with apparently generalized weakness and feeling bad.  Upon EMS arrival, that was the extent of her complaint.  She was awake and alert for them.  Patient states that she does not feel any worse than usual.  She does work live in a chair.  But she is very oriented she is alert and oriented x3.  She denies any kind of pain, just states that she felt a little bit tired today.  Level 5 chart caveat; no further history available due to patient status.  Patient is DNR/DNI and per report on hospice.  Past Medical History:  Diagnosis Date  . Apraxia following cerebral infarction   . CKD (chronic kidney disease), stage III (HCC)    a. With acute worsening/AKI noted on several admissions.  . Dementia   . Dementia without behavioral disturbance   . Depression   . Encephalopathy   . Hyperkalemia    a. 08/2015  . Hyperlipidemia   . Hyperlipidemia   . Hypertension   . Iron deficiency anemia   . Major depressive disorder   . Muscle weakness   . Parkinson disease (HCC)   . Parkinson's disease (HCC)   . Pericardial effusion    a. 06/2016 Echo: Ef 60-65%, no rwma, Gr1 DD, mild MR, mildly dil LA, PASP 72mmHg, mod circumferential pericardial effusion - no hemodynamic compromise.  . Sinus bradycardia    a. 08/2015 in setting of beta blocker and hyperkalemia.  . Stroke (HCC)   . TIA (transient ischemic attack)    a. 08/2015 - essentially neg w/u for stroke.  Marland Kitchen. UTI (urinary tract infection)     Patient Active Problem List   Diagnosis  Date Noted  . Expressive aphasia   . Palliative care encounter   . Goals of care, counseling/discussion   . Acute on chronic renal failure (HCC) 02/26/2017  . Anemia, chronic renal failure, stage 3 (moderate) (HCC) 02/26/2017  . Guaiac positive stools 02/26/2017  . Fall 02/26/2017  . Hypoxia   . Atrial tachycardia (HCC)   . Generalized weakness 07/05/2016  . Thrombocytopenia (HCC) 07/05/2016  . UTI (urinary tract infection) 07/05/2016  . Acute respiratory failure (HCC) 07/05/2016  . Pleural effusion 07/05/2016  . CKD (chronic kidney disease), stage III (HCC)   . Pericardial effusion   . Sinus bradycardia   . Acute renal failure (ARF) (HCC) 12/31/2015  . Chronic renal insufficiency 09/15/2015  . Bradycardia 09/15/2015  . Cellulitis 09/13/2015  . Parkinson disease (HCC) 09/13/2015  . HTN (hypertension) 09/13/2015  . HLD (hyperlipidemia) 09/13/2015  . Depression 09/13/2015  . Dementia 09/13/2015  . Hyperkalemia 09/13/2015  . TIA (transient ischemic attack) 08/27/2015    Past Surgical History:  Procedure Laterality Date  . ABDOMINAL HYSTERECTOMY    . APPENDECTOMY    . CHOLECYSTECTOMY      Prior to Admission medications   Medication Sig Start Date End Date Taking? Authorizing Provider  acetaminophen (TYLENOL) 325 MG tablet Take 650 mg by mouth every 6 (six) hours as needed.   Yes [provider]  aspirin EC 81 MG  tablet Take 81 mg by mouth daily.   Yes [provider]  calcitRIOL (ROCALTROL) 0.25 MCG capsule  06/08/17  Yes [provider]  carbidopa-levodopa (SINEMET IR) 25-100 MG tablet Take 1 tablet by mouth 3 (three) times daily.   Yes [provider]  donepezil (ARICEPT) 10 MG tablet Take 10 mg by mouth daily. 12/22/16  Yes [provider]  fluticasone (FLONASE) 50 MCG/ACT nasal spray Place 1 spray into both nostrils as needed for allergies or rhinitis.   Yes [provider]  guaifenesin (ROBITUSSIN) 100 MG/5ML syrup Take  200 mg by mouth 3 (three) times daily as needed for cough.   Yes [provider]  levETIRAcetam (KEPPRA) 250 MG tablet Take 250 mg by mouth daily.    Yes [provider]  loperamide (IMODIUM) 2 MG capsule Take by mouth as needed for diarrhea or loose stools.   Yes [provider]  LORazepam (ATIVAN) 0.5 MG tablet Take 0.5 mg by mouth every 4 (four) hours as needed for anxiety.   Yes [provider]  miconazole (BAZA ANTIFUNGAL) 2 % cream Apply 1 application topically 2 (two) times daily as needed. To sacrum every shift for redness after each toilet   Yes [provider]  Morphine Sulfate (MORPHINE CONCENTRATE) 10 mg / 0.5 ml concentrated solution Take 5 mg by mouth every hour as needed for severe pain.   Yes [provider]  Multiple Vitamins-Minerals (PRESERVISION AREDS 2 PO) Take 2 capsules by mouth daily.   Yes [provider]  Polyethylene Glycol 3350 (MIRALAX PO) Take by mouth.   Yes [provider]  QUEtiapine (SEROQUEL) 25 MG tablet Take 25 mg by mouth at bedtime.   Yes [provider]  Soft Lens Products (EQ SALINE SOLUTION/SENSITIVE) SOLN Apply 1 application topically every other day. Apply to neck topically once every other day for wound until healed.   Yes [provider]  vitamin B-12 (CYANOCOBALAMIN) 1000 MCG tablet Take 1,000 mcg by mouth daily.   Yes [provider]  levofloxacin (LEVAQUIN) 500 MG tablet Take 500 mg by mouth daily.    [provider]    Allergies Penicillins and Plavix [clopidogrel bisulfate]  Family History  Problem Relation Age of Onset  . Heart disease Mother   . Hypertension Other     Social History Social History   Tobacco Use  . Smoking status: Never Smoker  . Smokeless tobacco: Never Used  Substance Use Topics  . Alcohol use: No    Alcohol/week: 0.0 oz  . Drug use: No    Review of Systems Constitutional: No fever/chills Eyes: No visual  changes. ENT: No sore throat. No stiff neck no neck pain Cardiovascular: Denies chest pain. Respiratory: Denies shortness of breath. Gastrointestinal:   no vomiting.  No diarrhea.  No constipation. Genitourinary: Negative for dysuria. Musculoskeletal: Negative lower extremity swelling Skin: Negative for rash. Neurological: Negative for severe headaches, focal weakness or numbness.   ____________________________________________   PHYSICAL EXAM:  VITAL SIGNS: ED Triage Vitals  Enc Vitals Group     BP 12/08/17 2156 (!) 157/61     Pulse Rate 12/08/17 2156 (!) 58     Resp 12/08/17 2156 17     Temp 12/08/17 2156 97.8 F (36.6 C)     Temp Source 12/08/17 2156 Oral     SpO2 12/08/17 2156 93 %     Weight 12/08/17 2159 125 lb (56.7 kg)     Height 12/08/17 2159 5\' 2"  (1.575  m)     Head Circumference --      Peak Flow --      Pain Score --      Pain Loc --      Pain Edu? --      Excl. in GC? --     Constitutional: Alert and oriented. Well appearing and in no acute distress. Eyes: Conjunctivae are normal Head: Atraumatic HEENT: No congestion/rhinnorhea. Mucous membranes are moist.  Oropharynx non-erythematous Neck:   Nontender with no meningismus, no masses, no stridor Cardiovascular: Normal rate, regular rhythm. Grossly normal heart sounds.  Good peripheral circulation. Respiratory: Normal respiratory effort.  No retractions. Lungs CTAB. Abdominal: Soft and nontender. No distention. No guarding no rebound Back:  There is no focal tenderness or step off.  there is no midline tenderness there are no lesions noted. there is no CVA tenderness Musculoskeletal: No lower extremity tenderness, no upper extremity tenderness. No joint effusions, no DVT signs strong distal pulses no edema Neurologic:  Normal speech and language. No gross focal neurologic deficits are appreciated.  Skin:  Skin is warm, dry and intact. No rash noted. Psychiatric: Mood and affect are normal. Speech and behavior  are normal.  ____________________________________________   LABS (all labs ordered are listed, but only abnormal results are displayed)  Labs Reviewed  CBC WITH DIFFERENTIAL/PLATELET - Abnormal; Notable for the following components:      Result Value   Hemoglobin 11.4 (*)    HCT 34.2 (*)    RDW 15.4 (*)    Platelets 148 (*)    All other components within normal limits  COMPREHENSIVE METABOLIC PANEL - Abnormal; Notable for the following components:   CO2 21 (*)    BUN 38 (*)    Creatinine, Ser 1.67 (*)    Calcium 8.5 (*)    ALT <5 (*)    GFR calc non Af Amer 27 (*)    GFR calc Af Amer 32 (*)    All other components within normal limits  URINALYSIS, COMPLETE (UACMP) WITH MICROSCOPIC - Abnormal; Notable for the following components:   Color, Urine STRAW (*)    APPearance CLEAR (*)    All other components within normal limits  PROTIME-INR  TROPONIN I    Pertinent labs  results that were available during my care of the patient were reviewed by me and considered in my medical decision making (see chart for details). ____________________________________________  EKG  I personally interpreted any EKGs ordered by me or triage Sinus rhythm at 61 bpm no acute ST elevation or depression nonspecific ST changes, LAD noted, baseline artifact noted.  PACs noted. ____________________________________________  RADIOLOGY  Pertinent labs & imaging results that were available during my care of the patient were reviewed by me and considered in my medical decision making (see chart for details). If possible, patient and/or family made aware of any abnormal findings.  Dg Chest 2 View  Result Date: 12/08/2017 CLINICAL DATA:  84 y/o F; weakness and decreased level of consciousness. EXAM: CHEST  2 VIEW COMPARISON:  03/01/2017 chest radiograph FINDINGS: Stable cardiomegaly given projection and technique. Small bilateral pleural effusions. Ill-defined right basilar opacity. No pneumothorax. Chronic  moderate L1 compression deformity. IMPRESSION: Small bilateral effusions ill-defined right basilar opacity which may represent associated atelectasis or pneumonia. Stable cardiomegaly. Electronically Signed   By: Mitzi Hansen M.D.   On: 12/08/2017 22:30   Ct Head Wo Contrast  Result Date: 12/08/2017 CLINICAL DATA:  84 y/o F; weakness and altered level of  consciousness. EXAM: CT HEAD WITHOUT CONTRAST TECHNIQUE: Contiguous axial images were obtained from the base of the skull through the vertex without intravenous contrast. COMPARISON:  03/02/2017 MRI head.  02/26/2017 CT head. FINDINGS: Brain: No evidence of acute infarction, hemorrhage, hydrocephalus, extra-axial collection or mass lesion/mass effect. Stable chronic microvascular ischemic changes and parenchymal volume loss of the brain given differences in technique. Vascular: Mild calcific atherosclerosis of carotid siphons and vertebral arteries. No hyperdense vessel identified. Skull: Normal. Negative for fracture or focal lesion. Sinuses/Orbits: Trace mastoid opacification. Mild ethmoid and right maxillary sinus mucosal thickening. Small right maxillary sinus fluid level. Other: None. IMPRESSION: 1. No acute intracranial abnormality identified. 2. Stable chronic microvascular ischemic changes and parenchymal volume loss of the brain given differences in technique. 3. Paranasal sinus disease with a small right maxillary fluid level which may represent acute sinusitis in the appropriate clinical setting. Electronically Signed   By: Mitzi Hansen M.D.   On: 12/08/2017 22:36   ____________________________________________    PROCEDURES  Procedure(s) performed: None  Procedures  Critical Care performed: None  ____________________________________________   INITIAL IMPRESSION / ASSESSMENT AND PLAN / ED COURSE  Pertinent labs & imaging results that were available during my care of the patient were reviewed by me and  considered in my medical decision making (see chart for details).  Patient here for feeling generally weak however she has a very reassuring exam to my examination.  I do not see any significant focal numbness or weakness.  Patient answers questions appropriately and states she does feel somewhat tired.  It is of course the middle of the night.  Nonetheless we did institute a broad workup, CT scan of the head and chest x-ray are reassuring, there is a questionable infiltrate versus atelectasis on the chest x-ray sats are 96% on room air, certainly possible she is developing a pneumonia I suppose but nothing significant as noted otherwise clinically.  Renal function is at baseline. Does not have a high white count and she does not endorse cough.  Nonetheless given his x-ray findings of possible early pneumonia and the question of decreased energy and baseline today, we will consider starting her on coverage for possible atypical infection.  Urinalysis troponin and INR CT and all other findings are quite reassuring, I do not see any indication to keep her in the hospital during flu season and there certainly is no indication that she reports fluid with a febrile presentation and no complaints.  We will send her home therefore with atypical coverage for this questionable finding on chest x-ray and advise close outpatient follow-up with PCP tomorrow. Pt   ----------------------------------------- 11:47 PM on 12/08/2017 -----------------------------------------  I dw son he agrees w dc, he understands that there is always a risk of going home at this time I do not see any evidence of stroke, and patient is at her baseline to the extent that I can determine.  Is her strong preference to be discharged and he certainly agrees with this.  Patient is as noted DNR and in hospice.  She is alert and in no acute distress.  As noted, there is a questionable possible early pneumonia on chest x-ray and is very difficult to  rule this out otherwise so we will start her on empiric antibiotics, her son will follow with her closely and he completely understands and agrees with plan   ____________________________________________   FINAL CLINICAL IMPRESSION(S) / ED DIAGNOSES  Final diagnoses:  None      This chart  was dictated using voice recognition software.  Despite best efforts to proofread,  errors can occur which can change meaning.      Jeanmarie Plant, MD 12/08/17 2334    Jeanmarie Plant, MD 12/08/17 2350

## 2018-08-12 ENCOUNTER — Emergency Department (HOSPITAL_COMMUNITY)

## 2018-08-12 ENCOUNTER — Other Ambulatory Visit: Payer: Self-pay

## 2018-08-12 ENCOUNTER — Inpatient Hospital Stay (HOSPITAL_COMMUNITY)
Admission: EM | Admit: 2018-08-12 | Discharge: 2018-08-16 | DRG: 100 | Disposition: A | Discharge status: Skilled Nursing Facility | Source: Skilled Nursing Facility | Attending: Internal Medicine | Admitting: Internal Medicine

## 2018-08-12 DIAGNOSIS — N183 Chronic kidney disease, stage 3 unspecified: Secondary | ICD-10-CM | POA: Diagnosis present

## 2018-08-12 DIAGNOSIS — I517 Cardiomegaly: Secondary | ICD-10-CM | POA: Diagnosis present

## 2018-08-12 DIAGNOSIS — I69320 Aphasia following cerebral infarction: Secondary | ICD-10-CM

## 2018-08-12 DIAGNOSIS — I129 Hypertensive chronic kidney disease with stage 1 through stage 4 chronic kidney disease, or unspecified chronic kidney disease: Secondary | ICD-10-CM | POA: Diagnosis present

## 2018-08-12 DIAGNOSIS — G40909 Epilepsy, unspecified, not intractable, without status epilepticus: Principal | ICD-10-CM | POA: Diagnosis present

## 2018-08-12 DIAGNOSIS — R9401 Abnormal electroencephalogram [EEG]: Secondary | ICD-10-CM | POA: Diagnosis present

## 2018-08-12 DIAGNOSIS — G9340 Encephalopathy, unspecified: Secondary | ICD-10-CM

## 2018-08-12 DIAGNOSIS — Z87440 Personal history of urinary (tract) infections: Secondary | ICD-10-CM

## 2018-08-12 DIAGNOSIS — E785 Hyperlipidemia, unspecified: Secondary | ICD-10-CM | POA: Diagnosis present

## 2018-08-12 DIAGNOSIS — R569 Unspecified convulsions: Secondary | ICD-10-CM

## 2018-08-12 DIAGNOSIS — I10 Essential (primary) hypertension: Secondary | ICD-10-CM | POA: Diagnosis present

## 2018-08-12 DIAGNOSIS — Z7982 Long term (current) use of aspirin: Secondary | ICD-10-CM

## 2018-08-12 DIAGNOSIS — G92 Toxic encephalopathy: Secondary | ICD-10-CM

## 2018-08-12 DIAGNOSIS — D696 Thrombocytopenia, unspecified: Secondary | ICD-10-CM | POA: Diagnosis present

## 2018-08-12 DIAGNOSIS — I69351 Hemiplegia and hemiparesis following cerebral infarction affecting right dominant side: Secondary | ICD-10-CM

## 2018-08-12 DIAGNOSIS — G9341 Metabolic encephalopathy: Secondary | ICD-10-CM | POA: Diagnosis present

## 2018-08-12 DIAGNOSIS — G20 Parkinson's disease: Secondary | ICD-10-CM | POA: Diagnosis present

## 2018-08-12 DIAGNOSIS — R4781 Slurred speech: Secondary | ICD-10-CM | POA: Diagnosis present

## 2018-08-12 DIAGNOSIS — Z66 Do not resuscitate: Secondary | ICD-10-CM | POA: Diagnosis present

## 2018-08-12 DIAGNOSIS — Z8249 Family history of ischemic heart disease and other diseases of the circulatory system: Secondary | ICD-10-CM

## 2018-08-12 DIAGNOSIS — N179 Acute kidney failure, unspecified: Secondary | ICD-10-CM | POA: Diagnosis present

## 2018-08-12 DIAGNOSIS — R001 Bradycardia, unspecified: Secondary | ICD-10-CM | POA: Diagnosis present

## 2018-08-12 DIAGNOSIS — D631 Anemia in chronic kidney disease: Secondary | ICD-10-CM | POA: Diagnosis present

## 2018-08-12 DIAGNOSIS — F0280 Dementia in other diseases classified elsewhere without behavioral disturbance: Secondary | ICD-10-CM | POA: Diagnosis present

## 2018-08-12 DIAGNOSIS — D638 Anemia in other chronic diseases classified elsewhere: Secondary | ICD-10-CM | POA: Diagnosis present

## 2018-08-12 LAB — COMPREHENSIVE METABOLIC PANEL
ALBUMIN: 3.6 g/dL (ref 3.5–5.0)
ALT: <5 U/L (ref 0–44)
AST: 11 U/L — AB (ref 15–41)
Alkaline Phosphatase: 71 U/L (ref 38–126)
Anion gap: 10 (ref 5–15)
BUN: 32 mg/dL — AB (ref 8–23)
CHLORIDE: 108 mmol/L (ref 98–111)
CO2: 24 mmol/L (ref 22–32)
Calcium: 9.1 mg/dL (ref 8.9–10.3)
Creatinine, Ser: 2.03 mg/dL — ABNORMAL HIGH (ref 0.44–1.00)
GFR calc Af Amer: 25 mL/min — ABNORMAL LOW (ref >60)
GFR calc non Af Amer: 21 mL/min — ABNORMAL LOW (ref >60)
GLUCOSE: 114 mg/dL — AB (ref 70–99)
POTASSIUM: 4.5 mmol/L (ref 3.5–5.1)
Sodium: 142 mmol/L (ref 135–145)
Total Bilirubin: 0.5 mg/dL (ref 0.3–1.2)
Total Protein: 6.5 g/dL (ref 6.5–8.1)

## 2018-08-12 LAB — CBC
HCT: 33.8 % — ABNORMAL LOW (ref 36.0–46.0)
Hemoglobin: 10.5 g/dL — ABNORMAL LOW (ref 12.0–15.0)
MCH: 29.3 pg (ref 26.0–34.0)
MCHC: 31.1 g/dL (ref 30.0–36.0)
MCV: 94.4 fL (ref 80.0–100.0)
NRBC: 0 % (ref 0.0–0.2)
PLATELETS: 126 10*3/uL — AB (ref 150–400)
RBC: 3.58 MIL/uL — ABNORMAL LOW (ref 3.87–5.11)
RDW: 13.5 % (ref 11.5–15.5)
WBC: 4.7 10*3/uL (ref 4.0–10.5)

## 2018-08-12 LAB — DIFFERENTIAL
ABS IMMATURE GRANULOCYTES: 0.01 10*3/uL (ref 0.00–0.07)
BASOS PCT: 0 %
Basophils Absolute: 0 10*3/uL (ref 0.0–0.1)
EOS ABS: 0.2 10*3/uL (ref 0.0–0.5)
Eosinophils Relative: 5 %
IMMATURE GRANULOCYTES: 0 %
LYMPHS ABS: 1.6 10*3/uL (ref 0.7–4.0)
Lymphocytes Relative: 34 %
Monocytes Absolute: 0.4 10*3/uL (ref 0.1–1.0)
Monocytes Relative: 9 %
NEUTROS PCT: 52 %
Neutro Abs: 2.5 10*3/uL (ref 1.7–7.7)

## 2018-08-12 LAB — I-STAT CHEM 8, ED
BUN: 34 mg/dL — ABNORMAL HIGH (ref 8–23)
Calcium, Ion: 1.09 mmol/L — ABNORMAL LOW (ref 1.15–1.40)
Chloride: 107 mmol/L (ref 98–111)
Creatinine, Ser: 2.2 mg/dL — ABNORMAL HIGH (ref 0.44–1.00)
GLUCOSE: 114 mg/dL — AB (ref 70–99)
HCT: 31 % — ABNORMAL LOW (ref 36.0–46.0)
HEMOGLOBIN: 10.5 g/dL — AB (ref 12.0–15.0)
Potassium: 4.6 mmol/L (ref 3.5–5.1)
Sodium: 141 mmol/L (ref 135–145)
TCO2: 27 mmol/L (ref 22–32)

## 2018-08-12 LAB — PROTIME-INR
INR: 1.09
Prothrombin Time: 14 seconds (ref 11.4–15.2)

## 2018-08-12 LAB — URINALYSIS, ROUTINE W REFLEX MICROSCOPIC
Bacteria, UA: NONE SEEN
Bilirubin Urine: NEGATIVE
GLUCOSE, UA: NEGATIVE mg/dL
Hgb urine dipstick: NEGATIVE
Ketones, ur: NEGATIVE mg/dL
Leukocytes, UA: NEGATIVE
Nitrite: NEGATIVE
PH: 6 (ref 5.0–8.0)
Protein, ur: 30 mg/dL — AB
SPECIFIC GRAVITY, URINE: 1.013 (ref 1.005–1.030)

## 2018-08-12 LAB — I-STAT TROPONIN, ED: TROPONIN I, POC: 0.01 ng/mL (ref 0.00–0.08)

## 2018-08-12 LAB — APTT: aPTT: 31 seconds (ref 24–36)

## 2018-08-12 MED ORDER — SODIUM CHLORIDE 0.9 % IV SOLN
1000.0000 mg | INTRAVENOUS | Status: AC
  Administered 2018-08-12: 1000 mg via INTRAVENOUS
  Filled 2018-08-12: qty 20

## 2018-08-12 NOTE — Consult Note (Addendum)
Neurology Consultation  Reason for Consult: Code stroke, RSW, rt facial droop, slurred speech Referring Physician: Dr Particia Nearing  CC: RSW, rt facial droop, slurred speech  History is obtained from: Chart, EMS  HPI: Stacey Baker is a 84 y.o. female was a past medical history of prior stroke with right-sided weakness and aphasia, dementia, seizure (on renal dose of Keppra), chronic kidney disease 3, hyperlipidemia, hypertension, major depression, Parkinson's disease, sinus bradycardia, history of stroke and TIA in the past, history of urinary tract infection in the past, who is last seen normal at her residential facility at 5:30 PM and on repeat examination prior to EMS being called was noted to be listing to the right.  On initial evaluation by EMS, she had some right-sided weakness and right facial droop.  The reported right grip strength weakness which was somewhat improving on the way to the hospital. The patient has been seen by the neurology service at [facility] a few times in the past 3 to 4 years with similar presentations of right-sided weakness, which have most of the times been attributed to likely recrudescence of old stroke symptoms versus acute encephalopathy in the setting of an underlying UTI etc.  Patient was lethargic on exam but was able to open her eyes and answer some questions.  She was taken for a noncontrast CT of the head, which did not reveal any acute changes, no evidence of bleed, aspects score of 10. Decision made not to TPA due to nonfocal exam as documented below.  LKW: 1730 hrs tpa given?: no, likely encephalopathy not stroke mRS: 4  ROS: Unable to obtain due to altered mental status.   Past Medical History:  Diagnosis Date  . Apraxia following cerebral infarction   . CKD (chronic kidney disease), stage III (HCC)    a. With acute worsening/AKI noted on several admissions.  . Dementia   . Dementia without behavioral disturbance   . Depression    . Encephalopathy   . Hyperkalemia    a. 08/2015  . Hyperlipidemia   . Hyperlipidemia   . Hypertension   . Iron deficiency anemia   . Major depressive disorder   . Muscle weakness   . Parkinson disease (HCC)   . Parkinson's disease (HCC)   . Pericardial effusion    a. 06/2016 Echo: Ef 60-65%, no rwma, Gr1 DD, mild MR, mildly dil LA, PASP , mod circumferential pericardial effusion - no hemodynamic compromise.  . Sinus bradycardia    a. 08/2015 in setting of beta blocker and hyperkalemia.  . Stroke (HCC)   . TIA (transient ischemic attack)    a. 08/2015 - essentially neg w/u for stroke.  Marland Kitchen UTI (urinary tract infection)     Family History  Problem Relation Age of Onset  . Heart disease Mother   . Hypertension Other    Social History:   reports that she has never smoked. She has never used smokeless tobacco. She reports that she does not drink alcohol or use drugs.  Medications No current facility-administered medications for this encounter.   Current Outpatient Medications:  .  acetaminophen (TYLENOL) 325 MG tablet, Take 650 mg by mouth every 6 (six) hours as needed., Disp: , Rfl:  .  aspirin EC 81 MG tablet, Take 81 mg by mouth daily., Disp: , Rfl:  .  azithromycin (ZITHROMAX Z-PAK) 250 MG tablet, Take 2 tablets (500 mg) on  Day 1,  followed by 1 tablet (250 mg) once daily on Days 2 through 5.,  Disp: 6 each, Rfl: 0 .  calcitRIOL (ROCALTROL) 0.25 MCG capsule, , Disp: , Rfl:  .  carbidopa-levodopa (SINEMET IR) 25-100 MG tablet, Take 1 tablet by mouth 3 (three) times daily., Disp: , Rfl:  .  donepezil (ARICEPT) 10 MG tablet, Take 10 mg by mouth daily., Disp: , Rfl:  .  fluticasone (FLONASE) 50 MCG/ACT nasal spray, Place 1 spray into both nostrils as needed for allergies or rhinitis., Disp: , Rfl:  .  guaifenesin (ROBITUSSIN) 100 MG/5ML syrup, Take 200 mg by mouth 3 (three) times daily as needed for cough., Disp: , Rfl:  .  levETIRAcetam (KEPPRA) 250 MG tablet, Take 250 mg by  mouth daily. , Disp: , Rfl:  .  loperamide (IMODIUM) 2 MG capsule, Take by mouth as needed for diarrhea or loose stools., Disp: , Rfl:  .  LORazepam (ATIVAN) 0.5 MG tablet, Take 0.5 mg by mouth every 4 (four) hours as needed for anxiety., Disp: , Rfl:  .  miconazole (BAZA ANTIFUNGAL) 2 % cream, Apply 1 application topically 2 (two) times daily as needed. To sacrum every shift for redness after each toilet, Disp: , Rfl:  .  Morphine Sulfate (MORPHINE CONCENTRATE) 10 mg / 0.5 ml concentrated solution, Take 5 mg by mouth every hour as needed for severe pain., Disp: , Rfl:  .  Multiple Vitamins-Minerals (PRESERVISION AREDS 2 PO), Take 2 capsules by mouth daily., Disp: , Rfl:  .  Polyethylene Glycol 3350 (MIRALAX PO), Take by mouth., Disp: , Rfl:  .  QUEtiapine (SEROQUEL) 25 MG tablet, Take 25 mg by mouth at bedtime., Disp: , Rfl:  .  Soft Lens Products (EQ SALINE SOLUTION/SENSITIVE) SOLN, Apply 1 application topically every other day. Apply to neck topically once every other day for wound until healed., Disp: , Rfl:  .  vitamin B-12 (CYANOCOBALAMIN) 1000 MCG tablet, Take 1,000 mcg by mouth daily., Disp: , Rfl:   Exam: Current vital signs: Wt 58.7 kg   BMI 23.67 kg/m  Vital signs in last 24 hours: Weight:  [58.7 kg] 58.7 kg (10/24 2105) Blood pressure 150/72, heart rate 58.  GENERAL: Awake, alert in NAD HEENT: - Normocephalic and atraumatic, dry mm, no LN++, no Thyromegally LUNGS - Clear to auscultation bilaterally with no wheezes CV - S1S2 RRR, no m/r/g, equal pulses bilaterally. ABDOMEN - Soft, nontender, nondistended with normoactive BS Ext: warm, well perfused, intact peripheral pulses, no edema  NEURO:  Mental Status: Awake and alert.  Oriented to self.  Was able to tell her age correctly.  Unable to tell the month.  Unable to tell me where she is.  Poor attention concentration. Language: speech is mildly dysarthric.  Slow to respond to all questions.  Naming mildly impaired,  comprehension intact, repetition intact. Cranial Nerves: PERRL. EOMI, visual fields full, mild right lower facial weakness facial sensation intact, hearing intact, tongue/uvula/soft palate midline, normal sternocleidomastoid and trapezius muscle strength.  Motor: 4/5 both upper extremities with mild vertical drift in the right upper extremity.  3/5 in both lower extremities-both fall to bed before 5 seconds. Tone: Mild cogwheeling in all 4 extremities worse on the right Sensation- Intact to light touch bilaterally Coordination: FTN intact bilaterally Gait- deferred  NIHSS-8   Labs I have reviewed labs in epic and the results pertinent to this consultation are: CBC    Component Value Date/Time   WBC 5.0 12/08/2017 2203   RBC 3.91 12/08/2017 2203   HGB 11.4 (L) 12/08/2017 2203   HGB 10.7 (L) 01/13/2015 1610  HCT 34.2 (L) 12/08/2017 2203   HCT 31.4 (L) 01/13/2015 0438   PLT 148 (L) 12/08/2017 2203   PLT 125 (L) 01/13/2015 0438   MCV 87.3 12/08/2017 2203   MCV 92 01/13/2015 0438   MCH 29.1 12/08/2017 2203   MCHC 33.3 12/08/2017 2203   RDW 15.4 (H) 12/08/2017 2203   RDW 14.6 (H) 01/13/2015 0438   LYMPHSABS 1.9 12/08/2017 2203   LYMPHSABS 1.6 01/13/2015 0438   MONOABS 0.4 12/08/2017 2203   MONOABS 1.1 (H) 01/13/2015 0438   EOSABS 0.2 12/08/2017 2203   EOSABS 0.0 01/13/2015 0438   BASOSABS 0.0 12/08/2017 2203   BASOSABS 0.1 01/13/2015 0438   CMP     Component Value Date/Time   NA 140 12/08/2017 2203   NA 144 01/15/2015 0620   K 4.6 12/08/2017 2203   K 4.6 01/15/2015 0620   CL 110 12/08/2017 2203   CL 112 (H) 01/15/2015 0620   CO2 21 (L) 12/08/2017 2203   CO2 22 01/15/2015 0620   GLUCOSE 91 12/08/2017 2203   GLUCOSE 86 01/15/2015 0620   BUN 38 (H) 12/08/2017 2203   BUN 24 (H) 01/15/2015 0620   CREATININE 1.67 (H) 12/08/2017 2203   CREATININE 1.09 (H) 01/15/2015 0620   CALCIUM 8.5 (L) 12/08/2017 2203   CALCIUM 8.8 (L) 01/15/2015 0620   PROT 6.6 12/08/2017 2203    PROT 7.0 01/12/2015 2035   ALBUMIN 3.5 12/08/2017 2203   ALBUMIN 4.1 01/12/2015 2035   AST 16 12/08/2017 2203   AST 23 01/12/2015 2035   ALT <5 (L) 12/08/2017 2203   ALT <5 01/12/2015 2035   ALKPHOS 76 12/08/2017 2203   ALKPHOS 76 01/12/2015 2035   BILITOT 0.6 12/08/2017 2203   BILITOT 0.7 01/12/2015 2035   GFRNONAA 27 (L) 12/08/2017 2203   GFRNONAA 48 (L) 01/15/2015 0620   GFRAA 32 (L) 12/08/2017 2203   GFRAA 56 (L) 01/15/2015 1610   Imaging I have reviewed the images obtained:  CT-scan of the brain-no bleed, no acute changes, aspects 10.  Assessment:  84 year old woman with past medical history of prior stroke with right-sided weakness aphasia, dementia, chronic kidney disease, seizures, Parkinson's, history of stroke and TIA, major depression, sinus bradycardia brought in for concern for right-sided weakness as an acute code stroke. On examination and review of her chart, she does have some mild right hemiparesis, which seemed to be baseline based on the description of her exam and presentation today and compared to prior documented neurological evaluations. Stroke is still in the differentials given the risk factors including age, prior strokes, hypertension, hyperlipidemia but generally nonfocal unchanged exam from prior documented exams goes more in favor of multifactorial toxic metabolic encephalopathy and necrosis of old stroke symptoms rather than a new stroke.  Other differential is seizure given history of seizure disorder.  She is on Keppra 250 twice daily-renal dose. Either way, she would benefit from medical work-up.  Not a candidate for IV TPA due to exam more consistent with encephalopathy than stroke. Not a candidate for endovascular thrombectomy due to no signs of large vessel occlusion on exam.  CTA was not performed due to history of deranged renal function and low suspicion for stroke.  Recommendations: MRI of the brain without contrast when possible.  MRA head and  neck without contrast. Loaded with fosphenytoin 20 mg/kg phenytoin equivalent x1. Continue with Keppra 250 twice daily Routine EEG in the morning. Check labs including CBC, CMP, urinalysis and urinary toxicology screen. Check ammonia levels, B12  levels, TSH. Minimize sedating medications Continue home dose of Sinemet Continue Aricept Pursue full stroke risk factor work-up only if MRI of the brain is positive for stroke. Neurology will follow with you.  -- Milon Dikes, MD [facility] Pager: [phone] If 7pm to 7am, please call on call as listed on AMION.  CRITICAL CARE ATTESTATION This patient is critically ill and at significant risk of neurological worsening, death and care requires constant monitoring of vital signs, hemodynamics, respiratory, and cardiac monitoring. I spent 45  minutes of neurocritical care time performing neurological assessment, discussion with family, other specialists and medical decision making of high complexity in the care of  this patient.

## 2018-08-12 NOTE — Code Documentation (Addendum)
Code Stroke - 2009  Arrived at 2050 from [facility] from SNF. LSN 1730, R Facial Droop, R sided weakness, and slurred speech.  Hx: of Parkinson's, Stroke, Dementia (Memory impairment), and Seizures. Baseline R sided weakness.   CT Head - Negative.  NIH 8. Cancelled at 2059. Seizure > ? Metabolic > ?   Start Time 2018 End Time 2108

## 2018-08-12 NOTE — ED Triage Notes (Signed)
Pt arrived by [facility] as a code stroke from [facility]. Per staff, pt lsn at 1730. Staff had assisted to pt to bed, went back to check on her at a later time and noticed that pt was leaning to the R side, had garbled speech, and R sided facial droop. Pt has hx of CVA with R sided deficits.

## 2018-08-12 NOTE — ED Notes (Signed)
Patient transported to X-ray 

## 2018-08-12 NOTE — ED Provider Notes (Signed)
[facility] EMERGENCY DEPARTMENT Provider Note   CSN: [id number] Arrival date & time: 08/12/18  2050   An emergency department physician performed an initial assessment on this suspected stroke patient at 2052.  History   Chief Complaint Chief Complaint  Patient presents with  . Code Stroke    HPI Stacey Baker is a 84 y.o. female.  Pt presents to the ED today with right sided facial droop, garbled speech, and right sided weakness.  LSN 1730.  Code stroke was called and pt was met at the bridge by the stroke team.  The pt denies any pain.     Past Medical History:  Diagnosis Date  . Apraxia following cerebral infarction   . CKD (chronic kidney disease), stage III ([place])    a. With acute worsening/AKI noted on several admissions.  . Dementia   . Dementia without behavioral disturbance   . Depression   . Encephalopathy   . Hyperkalemia    a. 08/2015  . Hyperlipidemia   . Hyperlipidemia   . Hypertension   . Iron deficiency anemia   . Major depressive disorder   . Muscle weakness   . Parkinson disease ([place])   . Parkinson's disease ([place])   . Pericardial effusion    a. 06/2016 Echo: Ef 60-65%, no rwma, Gr1 DD, mild MR, mildly dil LA, PASP 69mHg, mod circumferential pericardial effusion - no hemodynamic compromise.  . Sinus bradycardia    a. 08/2015 in setting of beta blocker and hyperkalemia.  . Stroke ([place]   . TIA (transient ischemic attack)    a. 08/2015 - essentially neg w/u for stroke.  .Marland KitchenUTI (urinary tract infection)     Patient Active Problem List   Diagnosis Date Noted  . Seizure ([place] 08/12/2018  . Expressive aphasia   . Palliative care encounter   . Goals of care, counseling/discussion   . Acute on chronic renal failure ([facility] 02/26/2017  . Anemia, chronic renal failure, stage 3 (moderate) ([facility] 02/26/2017  . Guaiac positive stools 02/26/2017  . Fall 02/26/2017  . Hypoxia   . Atrial tachycardia ([facility]   . Generalized weakness  07/05/2016  . Thrombocytopenia ([facility] 07/05/2016  . UTI (urinary tract infection) 07/05/2016  . Acute respiratory failure ([facility] 07/05/2016  . Pleural effusion 07/05/2016  . CKD (chronic kidney disease), stage III ([place]   . Pericardial effusion   . Sinus bradycardia   . Acute renal failure (ARF) ([facility] 12/31/2015  . Chronic renal insufficiency 09/15/2015  . Bradycardia 09/15/2015  . Cellulitis 09/13/2015  . Parkinson disease ([facility] 09/13/2015  . HTN (hypertension) 09/13/2015  . HLD (hyperlipidemia) 09/13/2015  . Depression 09/13/2015  . Dementia ([facility] 09/13/2015  . Hyperkalemia 09/13/2015  . TIA (transient ischemic attack) 08/27/2015    Past Surgical History:  Procedure Laterality Date  . ABDOMINAL HYSTERECTOMY    . APPENDECTOMY    . CHOLECYSTECTOMY       OB History   None      Home Medications    Prior to Admission medications   Medication Sig Start Date End Date Taking? Authorizing Provider  acetaminophen (TYLENOL) 325 MG tablet Take 650 mg by mouth every 6 (six) hours as needed.    [provider]  aspirin EC 81 MG tablet Take 81 mg by mouth daily.    [provider]  azithromycin (ZITHROMAX Z-PAK) 250 MG tablet Take 2 tablets (500 mg) on  Day 1,  followed by 1 tablet (250 mg) once daily on Days 2 through  5. 12/08/17   Schuyler Amor, MD  calcitRIOL (ROCALTROL) 0.25 MCG capsule  06/08/17   [provider]  carbidopa-levodopa (SINEMET IR) 25-100 MG tablet Take 1 tablet by mouth 3 (three) times daily.    [provider]  donepezil (ARICEPT) 10 MG tablet Take 10 mg by mouth daily. 12/22/16   [provider]  fluticasone (FLONASE) 50 MCG/ACT nasal spray Place 1 spray into both nostrils as needed for allergies or rhinitis.    [provider]  guaifenesin (ROBITUSSIN) 100 MG/5ML syrup Take 200 mg by mouth 3 (three) times daily as needed for cough.    [provider]  levETIRAcetam (KEPPRA) 250 MG tablet Take 250 mg by  mouth daily.     [provider]  loperamide (IMODIUM) 2 MG capsule Take by mouth as needed for diarrhea or loose stools.    [provider]  LORazepam (ATIVAN) 0.5 MG tablet Take 0.5 mg by mouth every 4 (four) hours as needed for anxiety.    [provider]  miconazole (BAZA ANTIFUNGAL) 2 % cream Apply 1 application topically 2 (two) times daily as needed. To sacrum every shift for redness after each toilet    [provider]  Morphine Sulfate (MORPHINE CONCENTRATE) 10 mg / 0.5 ml concentrated solution Take 5 mg by mouth every hour as needed for severe pain.    [provider]  Multiple Vitamins-Minerals (PRESERVISION AREDS 2 PO) Take 2 capsules by mouth daily.    [provider]  Polyethylene Glycol 3350 (MIRALAX PO) Take by mouth.    [provider]  QUEtiapine (SEROQUEL) 25 MG tablet Take 25 mg by mouth at bedtime.    [provider]  Soft Lens Products (EQ SALINE SOLUTION/SENSITIVE) SOLN Apply 1 application topically every other day. Apply to neck topically once every other day for wound until healed.    [provider]  vitamin B-12 (CYANOCOBALAMIN) 1000 MCG tablet Take 1,000 mcg by mouth daily.    [provider]    Family History Family History  Problem Relation Age of Onset  . Heart disease Mother   . Hypertension Other     Social History Social History   Tobacco Use  . Smoking status: Never Smoker  . Smokeless tobacco: Never Used  Substance Use Topics  . Alcohol use: No    Alcohol/week: 0.0 standard drinks  . Drug use: No     Allergies   Penicillins and Plavix [clopidogrel bisulfate]   Review of Systems Review of Systems  Neurological: Positive for speech difficulty and weakness.  All other systems reviewed and are negative.    Physical Exam Updated Vital Signs BP 120/82   Pulse (!) 58   Resp 18   Wt 58.7 kg   SpO2 96%   BMI 23.67 kg/m   Physical Exam    Constitutional: She appears well-developed and well-nourished.  HENT:  Head: Atraumatic.  Right Ear: External ear normal.  Left Ear: External ear normal.  Nose: Nose normal.  Mouth/Throat: Oropharynx is clear and moist.  Right sided facial droop  Eyes: Pupils are equal, round, and reactive to light. Conjunctivae and EOM are normal.  Neck: Normal range of motion. Neck supple.  Cardiovascular: Normal rate, regular rhythm, normal heart sounds and intact distal pulses.  Pulmonary/Chest: Effort normal and breath sounds normal.  Abdominal: Soft. Bowel sounds are normal.  Musculoskeletal: Normal range of motion.  Neurological: She is alert. She displays tremor.  Bilateral 4/5 strength arms Bilateral weakness  both legs, but moving each leg Oriented to person and she knows she's in a doctor's office.  She knows her age.  Skin: Skin is warm. Capillary refill takes less than 2 seconds.  Psychiatric: She has a normal mood and affect.  Nursing note and vitals reviewed.    ED Treatments / Results  Labs (all labs ordered are listed, but only abnormal results are displayed) Labs Reviewed  CBC - Abnormal; Notable for the following components:      Result Value   RBC 3.58 (*)    Hemoglobin 10.5 (*)    HCT 33.8 (*)    Platelets 126 (*)    All other components within normal limits  COMPREHENSIVE METABOLIC PANEL - Abnormal; Notable for the following components:   Glucose, Bld 114 (*)    BUN 32 (*)    Creatinine, Ser 2.03 (*)    AST 11 (*)    GFR calc non Af Amer 21 (*)    GFR calc Af Amer 25 (*)    All other components within normal limits  URINALYSIS, ROUTINE W REFLEX MICROSCOPIC - Abnormal; Notable for the following components:   Protein, ur 30 (*)    All other components within normal limits  I-STAT CHEM 8, ED - Abnormal; Notable for the following components:   BUN 34 (*)    Creatinine, Ser 2.20 (*)    Glucose, Bld 114 (*)    Calcium, Ion 1.09 (*)    Hemoglobin 10.5 (*)    HCT  31.0 (*)    All other components within normal limits  PROTIME-INR  APTT  DIFFERENTIAL  I-STAT TROPONIN, ED  CBG MONITORING, ED    EKG EKG Interpretation  Date/Time:  Thursday August 12 2018 21:09:08 EDT Ventricular Rate:  52 PR Interval:    QRS Duration: 95 QT Interval:  444 QTC Calculation: 413 R Axis:   67 Text Interpretation:  Sinus rhythm Atrial premature complex Borderline low voltage, extremity leads Anteroseptal infarct, old Minimal ST elevation, inferior leads No significant change since last tracing Confirmed by Isla Pence [phone]) on 08/12/2018 10:19:42 PM   Radiology Dg Chest 2 View  Result Date: 08/12/2018 CLINICAL DATA:  84 y/o  F; mental status change. EXAM: CHEST - 2 VIEW COMPARISON:  12/08/2017 chest radiograph. FINDINGS: Stable cardiomegaly given projection and technique. Stable small bilateral pleural effusions. Mild reticular opacities, probably interstitial edema. No consolidation or pneumothorax. Stable chronic moderate L1 compression deformity. No acute osseous abnormality identified. IMPRESSION: Stable cardiomegaly and small bilateral effusions. Mild interstitial edema. Electronically Signed   By: Kristine Garbe M.D.   On: 08/12/2018 22:18   Ct Head Code Stroke Wo Contrast`  Result Date: 08/12/2018 CLINICAL DATA:  Code stroke. 84 y/o F; right-sided weakness, slurred speech, facial droop. EXAM: CT HEAD WITHOUT CONTRAST TECHNIQUE: Contiguous axial images were obtained from the base of the skull through the vertex without intravenous contrast. COMPARISON:  12/08/2017 CT head. FINDINGS: Brain: No evidence of acute infarction, hemorrhage, hydrocephalus, extra-axial collection or mass lesion/mass effect. Stable chronic microvascular ischemic changes and volume loss of the brain. Vascular: No hyperdense vessel or unexpected calcification. Skull: Normal. Negative for fracture or focal lesion. Sinuses/Orbits: Visible paranasal sinuses and the right mastoid  air cells are normally aerated. Minimal opacification of left mastoid air cells. High-riding left jugular bulb with thin sigmoid plate. Bilateral intra-ocular lens replacement. Other: None. ASPECTS [facility] Stroke Program Early CT Score) - Ganglionic level infarction (caudate, lentiform nuclei, internal capsule, insula, M1-M3 cortex): 7 - Supraganglionic  infarction (M4-M6 cortex): 3 Total score (0-10 with 10 being normal): 10 IMPRESSION: 1. No acute intracranial abnormality identified. 2. Stable chronic microvascular ischemic changes and volume loss of the brain. 3. ASPECTS is 10. These results were communicated to Dr. Rory Percy at 9:01 pmon 10/24/2019by text page via the [facility] messaging system. Electronically Signed   By: Kristine Garbe M.D.   On: 08/12/2018 21:02    Procedures Procedures (including critical care time)  Medications Ordered in ED Medications  fosPHENYtoin (CEREBYX) 1,000 mg PE in sodium chloride 0.9 % 50 mL IVPB (0 mg PE Intravenous Stopped 08/12/18 2157)     Initial Impression / Assessment and Plan / ED Course  I have reviewed the triage vital signs and the nursing notes.  Pertinent labs & imaging results that were available during my care of the patient were reviewed by me and considered in my medical decision making (see chart for details).  CRITICAL CARE Performed by: Isla Pence   Total critical care time: 30 minutes  Critical care time was exclusive of separately billable procedures and treating other patients.  Critical care was necessary to treat or prevent imminent or life-threatening deterioration.  Critical care was time spent personally by me on the following activities: development of treatment plan with patient and/or surrogate as well as nursing, discussions with consultants, evaluation of patient's response to treatment, examination of patient, obtaining history from patient or surrogate, ordering and performing treatments and interventions,  ordering and review of laboratory studies, ordering and review of radiographic studies, pulse oximetry and re-evaluation of patient's condition.    Dr. Rory Percy saw pt upon arrival.  Pt does have a hx of mild right hemiparesis compared to other neurological evaluations, so he cancelled code stroke.  He did not feel tpa was indicated.  He was concerned that she had a seizure, so pt was given 1 g cerebrex in ED.  He recommends continuing with keppra 250 mg bid.   He will order an EEG for the morning and recommends a MRI brain w/o contrast and MRA head and neck w/o contrast.  No evidence of UTI or PNA.  Pt d/w Dr. Marlowe Sax ([facility]) who will admit.  Final Clinical Impressions(s) / ED Diagnoses   Final diagnoses:  Acute encephalopathy    ED Discharge Orders    None       Isla Pence, MD 08/12/18 2345

## 2018-08-13 ENCOUNTER — Observation Stay (HOSPITAL_COMMUNITY)

## 2018-08-13 DIAGNOSIS — R4781 Slurred speech: Secondary | ICD-10-CM | POA: Diagnosis present

## 2018-08-13 DIAGNOSIS — G9340 Encephalopathy, unspecified: Secondary | ICD-10-CM | POA: Diagnosis present

## 2018-08-13 DIAGNOSIS — I517 Cardiomegaly: Secondary | ICD-10-CM | POA: Diagnosis present

## 2018-08-13 DIAGNOSIS — Z8249 Family history of ischemic heart disease and other diseases of the circulatory system: Secondary | ICD-10-CM | POA: Diagnosis not present

## 2018-08-13 DIAGNOSIS — I10 Essential (primary) hypertension: Secondary | ICD-10-CM

## 2018-08-13 DIAGNOSIS — R9401 Abnormal electroencephalogram [EEG]: Secondary | ICD-10-CM | POA: Diagnosis present

## 2018-08-13 DIAGNOSIS — R569 Unspecified convulsions: Secondary | ICD-10-CM

## 2018-08-13 DIAGNOSIS — D696 Thrombocytopenia, unspecified: Secondary | ICD-10-CM | POA: Diagnosis present

## 2018-08-13 DIAGNOSIS — G20 Parkinson's disease: Secondary | ICD-10-CM | POA: Diagnosis not present

## 2018-08-13 DIAGNOSIS — N179 Acute kidney failure, unspecified: Secondary | ICD-10-CM | POA: Diagnosis present

## 2018-08-13 DIAGNOSIS — Z66 Do not resuscitate: Secondary | ICD-10-CM | POA: Diagnosis present

## 2018-08-13 DIAGNOSIS — G40909 Epilepsy, unspecified, not intractable, without status epilepticus: Secondary | ICD-10-CM | POA: Diagnosis present

## 2018-08-13 DIAGNOSIS — G9341 Metabolic encephalopathy: Secondary | ICD-10-CM | POA: Diagnosis not present

## 2018-08-13 DIAGNOSIS — I69320 Aphasia following cerebral infarction: Secondary | ICD-10-CM | POA: Diagnosis not present

## 2018-08-13 DIAGNOSIS — I129 Hypertensive chronic kidney disease with stage 1 through stage 4 chronic kidney disease, or unspecified chronic kidney disease: Secondary | ICD-10-CM | POA: Diagnosis present

## 2018-08-13 DIAGNOSIS — R001 Bradycardia, unspecified: Secondary | ICD-10-CM | POA: Diagnosis present

## 2018-08-13 DIAGNOSIS — D631 Anemia in chronic kidney disease: Secondary | ICD-10-CM | POA: Diagnosis not present

## 2018-08-13 DIAGNOSIS — Z87440 Personal history of urinary (tract) infections: Secondary | ICD-10-CM | POA: Diagnosis not present

## 2018-08-13 DIAGNOSIS — E785 Hyperlipidemia, unspecified: Secondary | ICD-10-CM | POA: Diagnosis present

## 2018-08-13 DIAGNOSIS — R4182 Altered mental status, unspecified: Secondary | ICD-10-CM | POA: Diagnosis not present

## 2018-08-13 DIAGNOSIS — I69351 Hemiplegia and hemiparesis following cerebral infarction affecting right dominant side: Secondary | ICD-10-CM | POA: Diagnosis not present

## 2018-08-13 DIAGNOSIS — F0280 Dementia in other diseases classified elsewhere without behavioral disturbance: Secondary | ICD-10-CM | POA: Diagnosis present

## 2018-08-13 DIAGNOSIS — Z7982 Long term (current) use of aspirin: Secondary | ICD-10-CM | POA: Diagnosis not present

## 2018-08-13 DIAGNOSIS — N183 Chronic kidney disease, stage 3 (moderate): Secondary | ICD-10-CM | POA: Diagnosis not present

## 2018-08-13 DIAGNOSIS — D638 Anemia in other chronic diseases classified elsewhere: Secondary | ICD-10-CM | POA: Diagnosis present

## 2018-08-13 LAB — COMPREHENSIVE METABOLIC PANEL
ALBUMIN: 3 g/dL — AB (ref 3.5–5.0)
ALT: <5 U/L (ref 0–44)
ANION GAP: 5 (ref 5–15)
AST: 10 U/L — AB (ref 15–41)
Alkaline Phosphatase: 63 U/L (ref 38–126)
BILIRUBIN TOTAL: 0.6 mg/dL (ref 0.3–1.2)
BUN: 29 mg/dL — ABNORMAL HIGH (ref 8–23)
CHLORIDE: 108 mmol/L (ref 98–111)
CO2: 29 mmol/L (ref 22–32)
Calcium: 8.7 mg/dL — ABNORMAL LOW (ref 8.9–10.3)
Creatinine, Ser: 1.82 mg/dL — ABNORMAL HIGH (ref 0.44–1.00)
GFR calc Af Amer: 28 mL/min — ABNORMAL LOW (ref >60)
GFR calc non Af Amer: 24 mL/min — ABNORMAL LOW (ref >60)
GLUCOSE: 99 mg/dL (ref 70–99)
POTASSIUM: 3.9 mmol/L (ref 3.5–5.1)
SODIUM: 142 mmol/L (ref 135–145)
Total Protein: 5.5 g/dL — ABNORMAL LOW (ref 6.5–8.1)

## 2018-08-13 LAB — CBC
HEMATOCRIT: 31.7 % — AB (ref 36.0–46.0)
Hemoglobin: 9.8 g/dL — ABNORMAL LOW (ref 12.0–15.0)
MCH: 29 pg (ref 26.0–34.0)
MCHC: 30.9 g/dL (ref 30.0–36.0)
MCV: 93.8 fL (ref 80.0–100.0)
NRBC: 0 % (ref 0.0–0.2)
PLATELETS: 115 10*3/uL — AB (ref 150–400)
RBC: 3.38 MIL/uL — ABNORMAL LOW (ref 3.87–5.11)
RDW: 13.4 % (ref 11.5–15.5)
WBC: 4.1 10*3/uL (ref 4.0–10.5)

## 2018-08-13 LAB — VITAMIN B12: Vitamin B-12: 819 pg/mL (ref 180–914)

## 2018-08-13 LAB — TSH: TSH: 2.496 u[IU]/mL (ref 0.350–4.500)

## 2018-08-13 LAB — AMMONIA: AMMONIA: 9 umol/L (ref 9–35)

## 2018-08-13 LAB — MRSA PCR SCREENING: MRSA BY PCR: NEGATIVE

## 2018-08-13 MED ORDER — SODIUM CHLORIDE 0.9 % IV SOLN
INTRAVENOUS | Status: DC
  Administered 2018-08-13 – 2018-08-15 (×4): via INTRAVENOUS

## 2018-08-13 MED ORDER — RESOURCE THICKENUP CLEAR PO POWD
ORAL | Status: DC | PRN
  Filled 2018-08-13: qty 125

## 2018-08-13 MED ORDER — ORAL CARE MOUTH RINSE
15.0000 mL | Freq: Two times a day (BID) | OROMUCOSAL | Status: DC
  Administered 2018-08-13 – 2018-08-16 (×6): 15 mL via OROMUCOSAL

## 2018-08-13 MED ORDER — LORAZEPAM 2 MG/ML IJ SOLN
1.0000 mg | Freq: Once | INTRAMUSCULAR | Status: AC
  Administered 2018-08-13: 1 mg via INTRAVENOUS
  Filled 2018-08-13: qty 1

## 2018-08-13 MED ORDER — SODIUM CHLORIDE 0.9 % IV SOLN
250.0000 mg | Freq: Two times a day (BID) | INTRAVENOUS | Status: DC
  Administered 2018-08-13 – 2018-08-16 (×7): 250 mg via INTRAVENOUS
  Filled 2018-08-13 (×7): qty 2.5

## 2018-08-13 MED ORDER — HEPARIN SODIUM (PORCINE) 5000 UNIT/ML IJ SOLN
5000.0000 [IU] | Freq: Three times a day (TID) | INTRAMUSCULAR | Status: DC
  Administered 2018-08-13 – 2018-08-16 (×9): 5000 [IU] via SUBCUTANEOUS
  Filled 2018-08-13 (×9): qty 1

## 2018-08-13 NOTE — Procedures (Signed)
Date of recording10/25/2019  Referring physician Adair Patter  Reason for the study Altered mental status  Technical Digital EEG recording using 10-20 international electrode system  Description of the recording When awake posterior dominant rhythm is5-6Hz  symmetrical reactive EEG comprises of almost continuous theta slowing with intermittent generalized delta activity. Non REM stage II sleepwas not obtained Epileptiform features were not seen during this recording  Impression The EEG isabnormal and findings are consistent with mild to moderate generalized cerebral dysfunction. Epileptiform features were not seen during this recording.

## 2018-08-13 NOTE — Progress Notes (Addendum)
NEURO HOSPITALIST PROGRESS NOTE   Subjective: Patient in bed, sleeping eyes closed. NAD, no family at bedside.   Exam: Vitals:   08/13/18 0510 08/13/18 0710  BP: (!) 131/51 (!) 130/53  Pulse: (!) 41 (!) 41  Resp: 14   Temp:  (!) 95 F (35 C)  SpO2: 98% 97%    Physical Exam   HEENT-  Normocephalic, no lesions, without obvious abnormality.  Normal external eye and conjunctiva.   Cardiovascular- S1-S2 audible, pulses palpable throughout   Lungs-no rhonchi or wheezing noted, no excessive working breathing.  Saturations within normal limits on RA Abdomen- All 4 quadrants palpated and nontender Extremities- Warm, dry and intact Musculoskeletal-no joint tenderness, deformity or swelling Skin-warm and dry, no hyperpigmentation, vitiligo, or suspicious lesions   Neuro:  Mental Status: Drowsy, not cooperative with exam. Did not tell me her name or age. Would just say "stop". Responded quickly to light sternal rub. Slight dysarthria. Cranial Nerves: PERRL, patient actively resist eye opening.  Motor/ Sensory: Able to move all 4 extremities in response to painful stimuli.  RUE: 4/5 LUE: 4/5 was able to lift off bed in response to sternal rub.  Tone:Mild cogwheeling in all 4 extremities worse on the right LUE with action tremor that stopped when arm was touched. Plantars: Right: downgoing   Left: downgoing  Gait:deferred    Medications:  Scheduled: . heparin  5,000 Units Subcutaneous Q8H   Continuous: . sodium chloride 75 mL/hr at 08/13/18 0529  . levETIRAcetam 250 mg (08/13/18 0530)   PRN:  Pertinent Labs/Diagnostics: TSH: WNL CBC: B12: WNL Ammonia: WNL CMP: BUN: 29, Creatinine: 1.82 GFR: 24 UA: pending UDS: pending  Dg Chest 2 View  Result Date: 08/12/2018 CLINICAL DATA:  84 y/o  F; mental status change. EXAM: CHEST - 2 VIEW COMPARISON:  12/08/2017 chest radiograph. FINDINGS: Stable cardiomegaly given projection and technique. Stable small  bilateral pleural effusions. Mild reticular opacities, probably interstitial edema. No consolidation or pneumothorax. Stable chronic moderate L1 compression deformity. No acute osseous abnormality identified. IMPRESSION: Stable cardiomegaly and small bilateral effusions. Mild interstitial edema. Electronically Signed   By: Mitzi Hansen M.D.   On: 08/12/2018 22:18   Mr Maxine Glenn Head Wo Contrast  Result Date: 08/13/2018 CLINICAL DATA:  Gait imbalance, RIGHT-sided weakness and facial droop. Last seen normal at 5:30 p.m. Follow up code stroke. History of stroke, dementia, seizure, hypertension, Parkinson's disease. EXAM: MRI HEAD WITHOUT CONTRAST MRA HEAD WITHOUT CONTRAST MRA NECK WITHOUT CONTRAST TECHNIQUE: Multiplanar, multiecho pulse sequences of the brain and surrounding structures were obtained without intravenous contrast. Angiographic images of the Circle of Willis were obtained using MRA technique without intravenous contrast. Angiographic images of the neck were obtained using MRA technique without intravenous contrast. Carotid stenosis measurements (when applicable) are obtained utilizing NASCET criteria, using the distal internal carotid diameter as the denominator. COMPARISON:  CT HEAD August 12, 2018 and MRI head Mar 02, 2017 FINDINGS: MRI HEAD FINDINGS-moderately motion degraded examination. INTRACRANIAL CONTENTS: No reduced diffusion to suggest acute ischemia. No susceptibility artifact to suggest hemorrhage. Moderate to severe parenchymal brain volume loss. No hydrocephalus. Patchy supratentorial white matter FLAIR T2 hyperintensities compatible with moderate chronic small vessel ischemic changes. No suspicious parenchymal signal, masses, mass effect. No abnormal extra-axial fluid collections. No extra-axial masses. VASCULAR: Normal major intracranial vascular flow voids present at skull base. SKULL AND UPPER CERVICAL SPINE: No abnormal sellar expansion.  No suspicious calvarial bone marrow  signal. Craniocervical junction maintained. SINUSES/ORBITS: Trace bilateral mastoid effusions. Paranasal sinuses are well aerated. Included ocular globes and orbital contents are non-suspicious. Status post bilateral ocular lens implants. OTHER: None. MRA HEAD FINDINGS-moderately motion degraded examination. ANTERIOR CIRCULATION: Normal flow related enhancement of the included cervical, petrous, cavernous and supraclinoid internal carotid arteries. Patent anterior communicating artery. Patent anterior and middle cerebral arteries. Severe stenosis versus motion artifact RIGHT M2. No large vessel occlusion, flow limiting stenosis, aneurysm. POSTERIOR CIRCULATION: Codominant vertebral arteries. Vertebrobasilar arteries are patent, with normal flow related enhancement of the main branch vessels. Patent posterior cerebral arteries. Robust LEFT posterior communicating artery present. No large vessel occlusion, flow limiting stenosis,  aneurysm. ANATOMIC VARIANTS: None. Source images and MIP images were reviewed. MRA NECK FINDINGS-limited assessment of arch vessel origins by noncontrast MRA. ANTERIOR CIRCULATION: The common carotid arteries are widely patent bilaterally. The carotid bifurcations are patent bilaterally without hemodynamically significant stenosis by NASCET criteria. No flow limiting stenosis or luminal irregularity. POSTERIOR CIRCULATION: Bilateral vertebral arteries are patent to the vertebrobasilar junction. No flow limiting stenosis or luminal irregularity. Source images and MIP images were reviewed. IMPRESSION: MRI HEAD: 1. Moderately motion degraded examination. No acute intracranial process. 2. Stable parenchymal brain volume loss and moderate chronic small vessel ischemic changes. MRA HEAD: 1. Moderately motion degraded examination. No emergent large vessel occlusion. 2. Severe stenoses RIGHT M2 versus motion artifact. MRA NECK: 1. Negative noncontrast MRA neck. Electronically Signed   By: Awilda Metro M.D.   On: 08/13/2018 02:01   Mr Maxine Glenn Neck Wo Contrast  Result Date: 08/13/2018 CLINICAL DATA:  Gait imbalance, RIGHT-sided weakness and facial droop. Last seen normal at 5:30 p.m. Follow up code stroke. History of stroke, dementia, seizure, hypertension, Parkinson's disease. EXAM: MRI HEAD WITHOUT CONTRAST MRA HEAD WITHOUT CONTRAST MRA NECK WITHOUT CONTRAST TECHNIQUE: Multiplanar, multiecho pulse sequences of the brain and surrounding structures were obtained without intravenous contrast. Angiographic images of the Circle of Willis were obtained using MRA technique without intravenous contrast. Angiographic images of the neck were obtained using MRA technique without intravenous contrast. Carotid stenosis measurements (when applicable) are obtained utilizing NASCET criteria, using the distal internal carotid diameter as the denominator. COMPARISON:  CT HEAD August 12, 2018 and MRI head Mar 02, 2017 FINDINGS: MRI HEAD FINDINGS-moderately motion degraded examination. INTRACRANIAL CONTENTS: No reduced diffusion to suggest acute ischemia. No susceptibility artifact to suggest hemorrhage. Moderate to severe parenchymal brain volume loss. No hydrocephalus. Patchy supratentorial white matter FLAIR T2 hyperintensities compatible with moderate chronic small vessel ischemic changes. No suspicious parenchymal signal, masses, mass effect. No abnormal extra-axial fluid collections. No extra-axial masses. VASCULAR: Normal major intracranial vascular flow voids present at skull base. SKULL AND UPPER CERVICAL SPINE: No abnormal sellar expansion. No suspicious calvarial bone marrow signal. Craniocervical junction maintained. SINUSES/ORBITS: Trace bilateral mastoid effusions. Paranasal sinuses are well aerated. Included ocular globes and orbital contents are non-suspicious. Status post bilateral ocular lens implants. OTHER: None. MRA HEAD FINDINGS-moderately motion degraded examination. ANTERIOR CIRCULATION: Normal flow  related enhancement of the included cervical, petrous, cavernous and supraclinoid internal carotid arteries. Patent anterior communicating artery. Patent anterior and middle cerebral arteries. Severe stenosis versus motion artifact RIGHT M2. No large vessel occlusion, flow limiting stenosis, aneurysm. POSTERIOR CIRCULATION: Codominant vertebral arteries. Vertebrobasilar arteries are patent, with normal flow related enhancement of the main branch vessels. Patent posterior cerebral arteries. Robust LEFT posterior communicating artery present. No large vessel occlusion, flow limiting stenosis,  aneurysm. ANATOMIC VARIANTS: None. Source  images and MIP images were reviewed. MRA NECK FINDINGS-limited assessment of arch vessel origins by noncontrast MRA. ANTERIOR CIRCULATION: The common carotid arteries are widely patent bilaterally. The carotid bifurcations are patent bilaterally without hemodynamically significant stenosis by NASCET criteria. No flow limiting stenosis or luminal irregularity. POSTERIOR CIRCULATION: Bilateral vertebral arteries are patent to the vertebrobasilar junction. No flow limiting stenosis or luminal irregularity. Source images and MIP images were reviewed. IMPRESSION: MRI HEAD: 1. Moderately motion degraded examination. No acute intracranial process. 2. Stable parenchymal brain volume loss and moderate chronic small vessel ischemic changes. MRA HEAD: 1. Moderately motion degraded examination. No emergent large vessel occlusion. 2. Severe stenoses RIGHT M2 versus motion artifact. MRA NECK: 1. Negative noncontrast MRA neck. Electronically Signed   By: Awilda Metro M.D.   On: 08/13/2018 02:01   Mr Brain Wo Contrast  Result Date: 08/13/2018 CLINICAL DATA:  Gait imbalance, RIGHT-sided weakness and facial droop. Last seen normal at 5:30 p.m. Follow up code stroke. History of stroke, dementia, seizure, hypertension, Parkinson's disease. EXAM: MRI HEAD WITHOUT CONTRAST MRA HEAD WITHOUT CONTRAST  MRA NECK WITHOUT CONTRAST TECHNIQUE: Multiplanar, multiecho pulse sequences of the brain and surrounding structures were obtained without intravenous contrast. Angiographic images of the Circle of Willis were obtained using MRA technique without intravenous contrast. Angiographic images of the neck were obtained using MRA technique without intravenous contrast. Carotid stenosis measurements (when applicable) are obtained utilizing NASCET criteria, using the distal internal carotid diameter as the denominator. COMPARISON:  CT HEAD August 12, 2018 and MRI head Mar 02, 2017 FINDINGS: MRI HEAD FINDINGS-moderately motion degraded examination. INTRACRANIAL CONTENTS: No reduced diffusion to suggest acute ischemia. No susceptibility artifact to suggest hemorrhage. Moderate to severe parenchymal brain volume loss. No hydrocephalus. Patchy supratentorial white matter FLAIR T2 hyperintensities compatible with moderate chronic small vessel ischemic changes. No suspicious parenchymal signal, masses, mass effect. No abnormal extra-axial fluid collections. No extra-axial masses. VASCULAR: Normal major intracranial vascular flow voids present at skull base. SKULL AND UPPER CERVICAL SPINE: No abnormal sellar expansion. No suspicious calvarial bone marrow signal. Craniocervical junction maintained. SINUSES/ORBITS: Trace bilateral mastoid effusions. Paranasal sinuses are well aerated. Included ocular globes and orbital contents are non-suspicious. Status post bilateral ocular lens implants. OTHER: None. MRA HEAD FINDINGS-moderately motion degraded examination. ANTERIOR CIRCULATION: Normal flow related enhancement of the included cervical, petrous, cavernous and supraclinoid internal carotid arteries. Patent anterior communicating artery. Patent anterior and middle cerebral arteries. Severe stenosis versus motion artifact RIGHT M2. No large vessel occlusion, flow limiting stenosis, aneurysm. POSTERIOR CIRCULATION: Codominant vertebral  arteries. Vertebrobasilar arteries are patent, with normal flow related enhancement of the main branch vessels. Patent posterior cerebral arteries. Robust LEFT posterior communicating artery present. No large vessel occlusion, flow limiting stenosis,  aneurysm. ANATOMIC VARIANTS: None. Source images and MIP images were reviewed. MRA NECK FINDINGS-limited assessment of arch vessel origins by noncontrast MRA. ANTERIOR CIRCULATION: The common carotid arteries are widely patent bilaterally. The carotid bifurcations are patent bilaterally without hemodynamically significant stenosis by NASCET criteria. No flow limiting stenosis or luminal irregularity. POSTERIOR CIRCULATION: Bilateral vertebral arteries are patent to the vertebrobasilar junction. No flow limiting stenosis or luminal irregularity. Source images and MIP images were reviewed. IMPRESSION: MRI HEAD: 1. Moderately motion degraded examination. No acute intracranial process. 2. Stable parenchymal brain volume loss and moderate chronic small vessel ischemic changes. MRA HEAD: 1. Moderately motion degraded examination. No emergent large vessel occlusion. 2. Severe stenoses RIGHT M2 versus motion artifact. MRA NECK: 1. Negative noncontrast MRA neck. Electronically Signed  By: Awilda Metro M.D.   On: 08/13/2018 02:01   Ct Head Code Stroke Wo Contrast`  Result Date: 08/12/2018 CLINICAL DATA:  Code stroke. 84 y/o F; right-sided weakness, slurred speech, facial droop. EXAM: CT HEAD WITHOUT CONTRAST TECHNIQUE: Contiguous axial images were obtained from the base of the skull through the vertex without intravenous contrast. COMPARISON:  12/08/2017 CT head. FINDINGS: Brain: No evidence of acute infarction, hemorrhage, hydrocephalus, extra-axial collection or mass lesion/mass effect. Stable chronic microvascular ischemic changes and volume loss of the brain. Vascular: No hyperdense vessel or unexpected calcification. Skull: Normal. Negative for fracture or focal  lesion. Sinuses/Orbits: Visible paranasal sinuses and the right mastoid air cells are normally aerated. Minimal opacification of left mastoid air cells. High-riding left jugular bulb with thin sigmoid plate. Bilateral intra-ocular lens replacement. Other: None. ASPECTS [facility] Stroke Program Early CT Score) - Ganglionic level infarction (caudate, lentiform nuclei, internal capsule, insula, M1-M3 cortex): 7 - Supraganglionic infarction (M4-M6 cortex): 3 Total score (0-10 with 10 being normal): 10 IMPRESSION: 1. No acute intracranial abnormality identified. 2. Stable chronic microvascular ischemic changes and volume loss of the brain. 3. ASPECTS is 10. These results were communicated to Dr. Wilford Corner at 9:01 pmon 10/24/2019by text page via the [facility] messaging system. Electronically Signed   By: Mitzi Hansen M.D.   On: 08/12/2018 21:02   Assessment:  84 year old woman with past medical history of prior stroke with right-sided weakness aphasia, dementia, chronic kidney disease, seizures, Parkinson's, history of stroke and TIA, major depression, sinus bradycardia brought in for concern for right-sided weakness as an acute code stroke. On examination and review of her chart, she does have some mild right hemiparesis, which seemed to be baseline based on the description of her exam and presentation today and compared to prior documented neurological evaluations. Stroke is still in the differentials given the risk factors including age, prior strokes, hypertension, hyperlipidemia but generally nonfocal unchanged exam from prior documented exams goes more in favor of multifactorial toxic metabolic encephalopathy and necrosis of old stroke symptoms rather than a new stroke.  Other differential is seizure given history of seizure disorder.  She is on Keppra 250 twice daily-renal dose. Either way, she would benefit from medical work-up.  Not a candidate for IV TPA due to exam more consistent with  encephalopathy than stroke. Not a candidate for endovascular thrombectomy due to no signs of large vessel occlusion on exam.  CTA was not performed due to history of deranged renal function and low suspicion for stroke.  MRI Brain( motion degraded) showed no acute intracranial process. MRA head and neck: no LVO, severe stenosis right M2 vs artifact. Negative non contrast MRA of neck.    Recommendations:  Loaded with fosphenytoin 20 mg/kg phenytoin equivalent x1. Continue with Keppra 250 twice daily -Routine EEG  Minimize sedating medications Continue home dose of Sinemet Continue Aricept   Valentina Lucks, MSN, NP-C [facility] [phone]  Attending neurologist's note to follow    08/13/2018, 8:54 AM   NEUROHOSPITALIST ADDENDUM Performed a face to face diagnostic evaluation.   I have reviewed the contents of history and physical exam as documented by PA/ARNP/Resident and agree with above documentation.  I have discussed and formulated the above plan as documented. Edits to the note have been made as needed.  84 year old female with Parkinson's, dementia, seizures presents to [facility] for right-sided weakness.  MRI negative for acute stroke.  Likely to have worsening of her old stroke symptoms in the setting of  metabolic encephalopathy.  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  EEG obtained showed generalized slowing, no epileptiform activity or nonconvulsive seizures.    Patient continues to remain altered. somnolent likely from her dementia, delirium and possibly post-ictal state.  Continue Keppra 250 mg twice daily.  Minimize sedating medications.   Thanks for the consult.  Please call back with any questions    Georgiana Spinner Aroor MD [facility] [phone]   If 7pm to 7am, please call on call as listed on AMION.

## 2018-08-13 NOTE — Care Management Note (Signed)
Case Management Note  Patient Details  Name: Stacey Baker MRN: [id number] Date of Birth: 1932-04-16  Subjective/Objective:  Pt in for acute metabolic encephalopathy. She is from [facility].                   Action/Plan: Awaiting PT/OT recommendations. CM following.  Expected Discharge Date:                  Expected Discharge Plan:  Assisted Living / Rest Home  In-House Referral:  Clinical Social Work  Discharge planning Services     Post Acute Care Choice:    Choice offered to:     DME Arranged:    DME Agency:     HH Arranged:    HH Agency:     Status of Service:  In process, will continue to follow  If discussed at Long Length of Stay Meetings, dates discussed:    Additional Comments:  Kermit Balo, RN 08/13/2018, 11:31 AM

## 2018-08-13 NOTE — Evaluation (Signed)
Clinical/Bedside Swallow Evaluation Patient Details  Name: Stacey Baker MRN: [id number] Date of Birth: 08/08/1932  Today's Date: 08/13/2018    Past Medical History:  Past Medical History:  Diagnosis Date  . Apraxia following cerebral infarction   . CKD (chronic kidney disease), stage III (HCC)    a. With acute worsening/AKI noted on several admissions.  . Dementia   . Dementia without behavioral disturbance   . Depression   . Encephalopathy   . Hyperkalemia    a. 08/2015  . Hyperlipidemia   . Hyperlipidemia   . Hypertension   . Iron deficiency anemia   . Major depressive disorder   . Muscle weakness   . Parkinson disease (HCC)   . Parkinson's disease (HCC)   . Pericardial effusion    a. 06/2016 Echo: Ef 60-65%, no rwma, Gr1 DD, mild MR, mildly dil LA, PASP , mod circumferential pericardial effusion - no hemodynamic compromise.  . Sinus bradycardia    a. 08/2015 in setting of beta blocker and hyperkalemia.  . Stroke (HCC)   . TIA (transient ischemic attack)    a. 08/2015 - essentially neg w/u for stroke.  Marland Kitchen UTI (urinary tract infection)    Past Surgical History:  Past Surgical History:  Procedure Laterality Date  . ABDOMINAL HYSTERECTOMY    . APPENDECTOMY    . CHOLECYSTECTOMY     HPI:  84 year old woman with past medical history of prior stroke with right-sided weakness aphasia, dementia, chronic kidney disease, seizures, Parkinson's, history of stroke and TIA, major depression, sinus bradycardia brought in for concern for right-sided weakness as an acute code stroke. MRI negative for new CVA. Pt has been seen by SLP in the past with recommendations for aspiration precautions given need for assist with feeding. Dys 3/thin.    Assessment / Plan / Recommendation Clinical Impression  Pt was drowsy and lethargic but with max verbal and tactile cues was roused and appropriate for limited PO trials. Pt did state her name but otherwise said nonsensical and unintelligible  words/phrases throughout eval. Pt presents with signs of oropharyngeal dysphagia characterized by consistent wet vocal quality after thin liquid trials and delayed throat clear and occasional delayed cough. Multiple swallows also concerning for decreased airway protection. Diagnostic trials of nectar-thick liquids were tolerated with no wet vocal quality or delayed throat clearing or coughing. Puree trials were consumed without incident. Recommend initiate D1/puree diet and NTL; meds to be crushed in puree. Pt should only be fed when alert and responsive. Pt requires 1:1 feeder. ST will follow acutely for diet tolerance, advancement of liquids vs. need for instrumental testing.  SLP Visit Diagnosis: Dysphagia, unspecified (R13.10)    Aspiration Risk  Mild aspiration risk;Moderate aspiration risk    Diet Recommendation Dysphagia 1 (Puree);Nectar-thick liquid   Medication Administration: Crushed with puree Supervision: Full supervision/cueing for compensatory strategies Compensations: Minimize environmental distractions;Slow rate;Small sips/bites    Other  Recommendations Oral Care Recommendations: Oral care BID Other Recommendations: Order thickener from pharmacy   Follow up Recommendations        Frequency and Duration min 2x/week  2 weeks       Prognosis Prognosis for Safe Diet Advancement: Good Barriers to Reach Goals: Cognitive deficits;Behavior(lethargy)      Swallow Study   General Date of Onset: 08/12/18 HPI: 84 year old woman with past medical history of prior stroke with right-sided weakness aphasia, dementia, chronic kidney disease, seizures, Parkinson's, history of stroke and TIA, major depression, sinus bradycardia brought in for concern for right-sided weakness as  an acute code stroke. MRI negative for new CVA. Pt has been seen by SLP in the past with recommendations for aspiration precautions given need for assist with feeding. Dys 3/thin.  Respiratory Status: Room  air History of Recent Intubation: No Behavior/Cognition: Cooperative;Lethargic/Drowsy Oral Care Completed by SLP: Yes Oral Cavity - Dentition: Adequate natural dentition Baseline Vocal Quality: Normal Volitional Cough: Cognitively unable to elicit Volitional Swallow: Unable to elicit    Oral/Motor/Sensory Function Overall Oral Motor/Sensory Function: Within functional limits   Ice Chips Ice chips: Within functional limits Presentation: Spoon   Thin Liquid Thin Liquid: Impaired Presentation: Straw;Spoon Oral Phase Impairments: Poor awareness of bolus Pharyngeal  Phase Impairments: Suspected delayed Swallow;Multiple swallows;Throat Clearing - Delayed;Wet Vocal Quality    Nectar Thick Nectar Thick Liquid: Within functional limits   Honey Thick Honey Thick Liquid: Not tested   Puree Puree: Within functional limits   Solid    Kolbi Tofte H. Romie Levee, CCC-SLP Speech Language Pathologist  Solid: Not tested      Georgetta Haber 08/13/2018,2:27 PM

## 2018-08-13 NOTE — H&P (Signed)
History and Physical    Stacey Baker ZOX:[id number] DOB: 01-Nov-1931 DOA: 08/12/2018  PCP: Merlene Pulling, PA-C Patient coming from: [facility]  Chief Complaint: Slurring of speech, facial droop, right-sided deficits  HPI: Stacey Baker is a 84 y.o. female with medical history significant of stroke with right-sided weakness and aphasia, dementia, seizure (on renal dose of Keppra), CKD 3, hyperlipidemia, hypertension, depression, Parkinson's disease, sinus bradycardia presenting to the hospital for further evaluation of right facial droop and slurred speech.  Patient very somnolent and nonverbal.  No history could be obtained from her.  History obtained from chart: Patient was last seen normal at her residential facility at 5:30 PM and on repeat examination prior to EMS being called was noted to be listing to the right.  On initial EMS evaluation, she had some right-sided weakness and right facial droop.  Code stroke called.  ED Course: On arrival, bradycardic with heart rate in the 40s to 50s.  Blood pressure 157/45.  Satting well on room air.  No leukocytosis.  I-STAT troponin negative.  UA not suggestive of infection.  Chest x-ray showing stable cardiomegaly, stable small bilateral pleural effusions, and mild interstitial edema.  Head CT negative for acute findings.  MRI brain showing no acute intracranial process.  MRA head showing no emergent large vessel occlusion; severe stenosis right M2 versus motion artifact.  MRA neck negative.  Patient was seen by neurology (Dr. Wilford Corner) and code stroke was canceled.  Her presentation was thought to be more likely related to toxic metabolic encephalopathy vs seizure, acute stroke still on the differential.  Review of Systems: As per HPI otherwise 10 point review of systems negative.  Past Medical History:  Diagnosis Date  . Apraxia following cerebral infarction   . CKD (chronic kidney disease), stage III (HCC)    a. With acute  worsening/AKI noted on several admissions.  . Dementia   . Dementia without behavioral disturbance   . Depression   . Encephalopathy   . Hyperkalemia    a. 08/2015  . Hyperlipidemia   . Hyperlipidemia   . Hypertension   . Iron deficiency anemia   . Major depressive disorder   . Muscle weakness   . Parkinson disease (HCC)   . Parkinson's disease (HCC)   . Pericardial effusion    a. 06/2016 Echo: Ef 60-65%, no rwma, Gr1 DD, mild MR, mildly dil LA, PASP , mod circumferential pericardial effusion - no hemodynamic compromise.  . Sinus bradycardia    a. 08/2015 in setting of beta blocker and hyperkalemia.  . Stroke (HCC)   . TIA (transient ischemic attack)    a. 08/2015 - essentially neg w/u for stroke.  Marland Kitchen UTI (urinary tract infection)     Past Surgical History:  Procedure Laterality Date  . ABDOMINAL HYSTERECTOMY    . APPENDECTOMY    . CHOLECYSTECTOMY       reports that she has never smoked. She has never used smokeless tobacco. She reports that she does not drink alcohol or use drugs.  Allergies  Allergen Reactions  . Penicillins Other (See Comments)    "Allergic," per [facility] Has patient had a PCN reaction causing immediate rash, facial/tongue/throat swelling, SOB or lightheadedness with hypotension: Unk Has patient had a PCN reaction causing severe rash involving mucus membranes or skin necrosis: Unk Has patient had a PCN reaction that required hospitalization: Unk Has patient had a PCN reaction occurring within the last 10 years: Unk If all of the above answers are "NO",  then may proceed with Cephalosporin use.   Marland Kitchen Plavix [Clopidogrel Bisulfate] Other (See Comments)    "Allergic," per [facility]    Family History  Problem Relation Age of Onset  . Heart disease Mother   . Hypertension Other     Prior to Admission medications   Medication Sig Start Date End Date Taking? Authorizing Provider  acetaminophen (TYLENOL) 325 MG tablet Take 650 mg by mouth every 6 (six) hours  as needed.    [provider]  aspirin EC 81 MG tablet Take 81 mg by mouth daily.    [provider]  azithromycin (ZITHROMAX Z-PAK) 250 MG tablet Take 2 tablets (500 mg) on  Day 1,  followed by 1 tablet (250 mg) once daily on Days 2 through 5. 12/08/17   Jeanmarie Plant, MD  calcitRIOL (ROCALTROL) 0.25 MCG capsule  06/08/17   [provider]  carbidopa-levodopa (SINEMET IR) 25-100 MG tablet Take 1 tablet by mouth 3 (three) times daily.    [provider]  donepezil (ARICEPT) 10 MG tablet Take 10 mg by mouth daily. 12/22/16   [provider]  fluticasone (FLONASE) 50 MCG/ACT nasal spray Place 1 spray into both nostrils as needed for allergies or rhinitis.    [provider]  guaifenesin (ROBITUSSIN) 100 MG/5ML syrup Take 200 mg by mouth 3 (three) times daily as needed for cough.    [provider]  levETIRAcetam (KEPPRA) 250 MG tablet Take 250 mg by mouth daily.     [provider]  loperamide (IMODIUM) 2 MG capsule Take by mouth as needed for diarrhea or loose stools.    [provider]  LORazepam (ATIVAN) 0.5 MG tablet Take 0.5 mg by mouth every 4 (four) hours as needed for anxiety.    [provider]  miconazole (BAZA ANTIFUNGAL) 2 % cream Apply 1 application topically 2 (two) times daily as needed. To sacrum every shift for redness after each toilet    [provider]  Morphine Sulfate (MORPHINE CONCENTRATE) 10 mg / 0.5 ml concentrated solution Take 5 mg by mouth every hour as needed for severe pain.    [provider]  Multiple Vitamins-Minerals (PRESERVISION AREDS 2 PO) Take 2 capsules by mouth daily.    [provider]  Polyethylene Glycol 3350 (MIRALAX PO) Take by mouth.    [provider]  QUEtiapine (SEROQUEL) 25 MG tablet Take 25 mg by mouth at bedtime.    [provider]  Soft Lens Products (EQ SALINE SOLUTION/SENSITIVE) SOLN Apply 1 application topically  every other day. Apply to neck topically once every other day for wound until healed.    [provider]  vitamin B-12 (CYANOCOBALAMIN) 1000 MCG tablet Take 1,000 mcg by mouth daily.    [provider]    Physical Exam: Vitals:   08/12/18 2230 08/12/18 2300 08/12/18 2330 08/13/18 0208  BP: (!) 110/46 120/82 (!) 154/65 (!) 142/52  Pulse: (!) 47 (!) 58 (!) 45   Resp: 13 18 16    SpO2: 93% 96% 97% 98%  Weight:       Physical Exam  Constitutional: No distress.  HENT:  Head: Normocephalic and atraumatic.  Eyes: Right eye exhibits no discharge. Left eye exhibits no discharge.  Unable to assess pupils due to lack of patient cooperation.  Neck: Neck supple. No tracheal deviation present.  Cardiovascular: Regular rhythm and intact distal pulses.  Bradycardic  Pulmonary/Chest: She has no wheezes.  Anterior lung fields clear to auscultation.  Abdominal:  Soft. Bowel sounds are normal. She exhibits no distension. There is no tenderness.  Musculoskeletal: She exhibits no edema.  Neurological:  Very somnolent and nonverbal Wiggles toes on command (left lower extremity only) Moves left upper extremity on command  Skin: Skin is warm and dry. She is not diaphoretic.     Labs on Admission: I have personally reviewed following labs and imaging studies  CBC: Recent Labs  Lab 08/12/18 2043 08/12/18 2056  WBC 4.7  --   NEUTROABS 2.5  --   HGB 10.5* 10.5*  HCT 33.8* 31.0*  MCV 94.4  --   PLT 126*  --    Basic Metabolic Panel: Recent Labs  Lab 08/12/18 2043 08/12/18 2056  NA 142 141  K 4.5 4.6  CL 108 107  CO2 24  --   GLUCOSE 114* 114*  BUN 32* 34*  CREATININE 2.03* 2.20*  CALCIUM 9.1  --    GFR: CrCl cannot be calculated (Unknown ideal weight.). Liver Function Tests: Recent Labs  Lab 08/12/18 2043  AST 11*  ALT <5  ALKPHOS 71  BILITOT 0.5  PROT 6.5  ALBUMIN 3.6   No results for input(s): LIPASE, AMYLASE in the last 168 hours. No results for  input(s): AMMONIA in the last 168 hours. Coagulation Profile: Recent Labs  Lab 08/12/18 2043  INR 1.09   Cardiac Enzymes: No results for input(s): CKTOTAL, CKMB, CKMBINDEX, TROPONINI in the last 168 hours. BNP (last 3 results) No results for input(s): PROBNP in the last 8760 hours. HbA1C: No results for input(s): HGBA1C in the last 72 hours. CBG: No results for input(s): GLUCAP in the last 168 hours. Lipid Profile: No results for input(s): CHOL, HDL, LDLCALC, TRIG, CHOLHDL, LDLDIRECT in the last 72 hours. Thyroid Function Tests: No results for input(s): TSH, T4TOTAL, FREET4, T3FREE, THYROIDAB in the last 72 hours. Anemia Panel: No results for input(s): VITAMINB12, FOLATE, FERRITIN, TIBC, IRON, RETICCTPCT in the last 72 hours. Urine analysis:    Component Value Date/Time   COLORURINE YELLOW 08/12/2018 2305   APPEARANCEUR CLEAR 08/12/2018 2305   APPEARANCEUR Cloudy 01/12/2015 2035   LABSPEC 1.013 08/12/2018 2305   LABSPEC 1.018 01/12/2015 2035   PHURINE 6.0 08/12/2018 2305   GLUCOSEU NEGATIVE 08/12/2018 2305   GLUCOSEU Negative 01/12/2015 2035   HGBUR NEGATIVE 08/12/2018 2305   BILIRUBINUR NEGATIVE 08/12/2018 2305   BILIRUBINUR Negative 01/12/2015 2035   KETONESUR NEGATIVE 08/12/2018 2305   PROTEINUR 30 (A) 08/12/2018 2305   NITRITE NEGATIVE 08/12/2018 2305   LEUKOCYTESUR NEGATIVE 08/12/2018 2305   LEUKOCYTESUR 3+ 01/12/2015 2035    Radiological Exams on Admission: Dg Chest 2 View  Result Date: 08/12/2018 CLINICAL DATA:  84 y/o  F; mental status change. EXAM: CHEST - 2 VIEW COMPARISON:  12/08/2017 chest radiograph. FINDINGS: Stable cardiomegaly given projection and technique. Stable small bilateral pleural effusions. Mild reticular opacities, probably interstitial edema. No consolidation or pneumothorax. Stable chronic moderate L1 compression deformity. No acute osseous abnormality identified. IMPRESSION: Stable cardiomegaly and small bilateral effusions. Mild interstitial  edema. Electronically Signed   By: Mitzi Hansen M.D.   On: 08/12/2018 22:18   Mr Maxine Glenn Head Wo Contrast  Result Date: 08/13/2018 CLINICAL DATA:  Gait imbalance, RIGHT-sided weakness and facial droop. Last seen normal at 5:30 p.m. Follow up code stroke. History of stroke, dementia, seizure, hypertension, Parkinson's disease. EXAM: MRI HEAD WITHOUT CONTRAST MRA HEAD WITHOUT CONTRAST MRA NECK WITHOUT CONTRAST TECHNIQUE: Multiplanar, multiecho pulse sequences of the brain and surrounding structures were obtained without intravenous contrast.  Angiographic images of the Circle of Willis were obtained using MRA technique without intravenous contrast. Angiographic images of the neck were obtained using MRA technique without intravenous contrast. Carotid stenosis measurements (when applicable) are obtained utilizing NASCET criteria, using the distal internal carotid diameter as the denominator. COMPARISON:  CT HEAD August 12, 2018 and MRI head Mar 02, 2017 FINDINGS: MRI HEAD FINDINGS-moderately motion degraded examination. INTRACRANIAL CONTENTS: No reduced diffusion to suggest acute ischemia. No susceptibility artifact to suggest hemorrhage. Moderate to severe parenchymal brain volume loss. No hydrocephalus. Patchy supratentorial white matter FLAIR T2 hyperintensities compatible with moderate chronic small vessel ischemic changes. No suspicious parenchymal signal, masses, mass effect. No abnormal extra-axial fluid collections. No extra-axial masses. VASCULAR: Normal major intracranial vascular flow voids present at skull base. SKULL AND UPPER CERVICAL SPINE: No abnormal sellar expansion. No suspicious calvarial bone marrow signal. Craniocervical junction maintained. SINUSES/ORBITS: Trace bilateral mastoid effusions. Paranasal sinuses are well aerated. Included ocular globes and orbital contents are non-suspicious. Status post bilateral ocular lens implants. OTHER: None. MRA HEAD FINDINGS-moderately motion  degraded examination. ANTERIOR CIRCULATION: Normal flow related enhancement of the included cervical, petrous, cavernous and supraclinoid internal carotid arteries. Patent anterior communicating artery. Patent anterior and middle cerebral arteries. Severe stenosis versus motion artifact RIGHT M2. No large vessel occlusion, flow limiting stenosis, aneurysm. POSTERIOR CIRCULATION: Codominant vertebral arteries. Vertebrobasilar arteries are patent, with normal flow related enhancement of the main branch vessels. Patent posterior cerebral arteries. Robust LEFT posterior communicating artery present. No large vessel occlusion, flow limiting stenosis,  aneurysm. ANATOMIC VARIANTS: None. Source images and MIP images were reviewed. MRA NECK FINDINGS-limited assessment of arch vessel origins by noncontrast MRA. ANTERIOR CIRCULATION: The common carotid arteries are widely patent bilaterally. The carotid bifurcations are patent bilaterally without hemodynamically significant stenosis by NASCET criteria. No flow limiting stenosis or luminal irregularity. POSTERIOR CIRCULATION: Bilateral vertebral arteries are patent to the vertebrobasilar junction. No flow limiting stenosis or luminal irregularity. Source images and MIP images were reviewed. IMPRESSION: MRI HEAD: 1. Moderately motion degraded examination. No acute intracranial process. 2. Stable parenchymal brain volume loss and moderate chronic small vessel ischemic changes. MRA HEAD: 1. Moderately motion degraded examination. No emergent large vessel occlusion. 2. Severe stenoses RIGHT M2 versus motion artifact. MRA NECK: 1. Negative noncontrast MRA neck. Electronically Signed   By: Awilda Metro M.D.   On: 08/13/2018 02:01   Mr Maxine Glenn Neck Wo Contrast  Result Date: 08/13/2018 CLINICAL DATA:  Gait imbalance, RIGHT-sided weakness and facial droop. Last seen normal at 5:30 p.m. Follow up code stroke. History of stroke, dementia, seizure, hypertension, Parkinson's disease.  EXAM: MRI HEAD WITHOUT CONTRAST MRA HEAD WITHOUT CONTRAST MRA NECK WITHOUT CONTRAST TECHNIQUE: Multiplanar, multiecho pulse sequences of the brain and surrounding structures were obtained without intravenous contrast. Angiographic images of the Circle of Willis were obtained using MRA technique without intravenous contrast. Angiographic images of the neck were obtained using MRA technique without intravenous contrast. Carotid stenosis measurements (when applicable) are obtained utilizing NASCET criteria, using the distal internal carotid diameter as the denominator. COMPARISON:  CT HEAD August 12, 2018 and MRI head Mar 02, 2017 FINDINGS: MRI HEAD FINDINGS-moderately motion degraded examination. INTRACRANIAL CONTENTS: No reduced diffusion to suggest acute ischemia. No susceptibility artifact to suggest hemorrhage. Moderate to severe parenchymal brain volume loss. No hydrocephalus. Patchy supratentorial white matter FLAIR T2 hyperintensities compatible with moderate chronic small vessel ischemic changes. No suspicious parenchymal signal, masses, mass effect. No abnormal extra-axial fluid collections. No extra-axial masses. VASCULAR: Normal major intracranial vascular  flow voids present at skull base. SKULL AND UPPER CERVICAL SPINE: No abnormal sellar expansion. No suspicious calvarial bone marrow signal. Craniocervical junction maintained. SINUSES/ORBITS: Trace bilateral mastoid effusions. Paranasal sinuses are well aerated. Included ocular globes and orbital contents are non-suspicious. Status post bilateral ocular lens implants. OTHER: None. MRA HEAD FINDINGS-moderately motion degraded examination. ANTERIOR CIRCULATION: Normal flow related enhancement of the included cervical, petrous, cavernous and supraclinoid internal carotid arteries. Patent anterior communicating artery. Patent anterior and middle cerebral arteries. Severe stenosis versus motion artifact RIGHT M2. No large vessel occlusion, flow limiting  stenosis, aneurysm. POSTERIOR CIRCULATION: Codominant vertebral arteries. Vertebrobasilar arteries are patent, with normal flow related enhancement of the main branch vessels. Patent posterior cerebral arteries. Robust LEFT posterior communicating artery present. No large vessel occlusion, flow limiting stenosis,  aneurysm. ANATOMIC VARIANTS: None. Source images and MIP images were reviewed. MRA NECK FINDINGS-limited assessment of arch vessel origins by noncontrast MRA. ANTERIOR CIRCULATION: The common carotid arteries are widely patent bilaterally. The carotid bifurcations are patent bilaterally without hemodynamically significant stenosis by NASCET criteria. No flow limiting stenosis or luminal irregularity. POSTERIOR CIRCULATION: Bilateral vertebral arteries are patent to the vertebrobasilar junction. No flow limiting stenosis or luminal irregularity. Source images and MIP images were reviewed. IMPRESSION: MRI HEAD: 1. Moderately motion degraded examination. No acute intracranial process. 2. Stable parenchymal brain volume loss and moderate chronic small vessel ischemic changes. MRA HEAD: 1. Moderately motion degraded examination. No emergent large vessel occlusion. 2. Severe stenoses RIGHT M2 versus motion artifact. MRA NECK: 1. Negative noncontrast MRA neck. Electronically Signed   By: Awilda Metro M.D.   On: 08/13/2018 02:01   Mr Brain Wo Contrast  Result Date: 08/13/2018 CLINICAL DATA:  Gait imbalance, RIGHT-sided weakness and facial droop. Last seen normal at 5:30 p.m. Follow up code stroke. History of stroke, dementia, seizure, hypertension, Parkinson's disease. EXAM: MRI HEAD WITHOUT CONTRAST MRA HEAD WITHOUT CONTRAST MRA NECK WITHOUT CONTRAST TECHNIQUE: Multiplanar, multiecho pulse sequences of the brain and surrounding structures were obtained without intravenous contrast. Angiographic images of the Circle of Willis were obtained using MRA technique without intravenous contrast. Angiographic  images of the neck were obtained using MRA technique without intravenous contrast. Carotid stenosis measurements (when applicable) are obtained utilizing NASCET criteria, using the distal internal carotid diameter as the denominator. COMPARISON:  CT HEAD August 12, 2018 and MRI head Mar 02, 2017 FINDINGS: MRI HEAD FINDINGS-moderately motion degraded examination. INTRACRANIAL CONTENTS: No reduced diffusion to suggest acute ischemia. No susceptibility artifact to suggest hemorrhage. Moderate to severe parenchymal brain volume loss. No hydrocephalus. Patchy supratentorial white matter FLAIR T2 hyperintensities compatible with moderate chronic small vessel ischemic changes. No suspicious parenchymal signal, masses, mass effect. No abnormal extra-axial fluid collections. No extra-axial masses. VASCULAR: Normal major intracranial vascular flow voids present at skull base. SKULL AND UPPER CERVICAL SPINE: No abnormal sellar expansion. No suspicious calvarial bone marrow signal. Craniocervical junction maintained. SINUSES/ORBITS: Trace bilateral mastoid effusions. Paranasal sinuses are well aerated. Included ocular globes and orbital contents are non-suspicious. Status post bilateral ocular lens implants. OTHER: None. MRA HEAD FINDINGS-moderately motion degraded examination. ANTERIOR CIRCULATION: Normal flow related enhancement of the included cervical, petrous, cavernous and supraclinoid internal carotid arteries. Patent anterior communicating artery. Patent anterior and middle cerebral arteries. Severe stenosis versus motion artifact RIGHT M2. No large vessel occlusion, flow limiting stenosis, aneurysm. POSTERIOR CIRCULATION: Codominant vertebral arteries. Vertebrobasilar arteries are patent, with normal flow related enhancement of the main branch vessels. Patent posterior cerebral arteries. Robust LEFT posterior communicating artery present.  No large vessel occlusion, flow limiting stenosis,  aneurysm. ANATOMIC VARIANTS:  None. Source images and MIP images were reviewed. MRA NECK FINDINGS-limited assessment of arch vessel origins by noncontrast MRA. ANTERIOR CIRCULATION: The common carotid arteries are widely patent bilaterally. The carotid bifurcations are patent bilaterally without hemodynamically significant stenosis by NASCET criteria. No flow limiting stenosis or luminal irregularity. POSTERIOR CIRCULATION: Bilateral vertebral arteries are patent to the vertebrobasilar junction. No flow limiting stenosis or luminal irregularity. Source images and MIP images were reviewed. IMPRESSION: MRI HEAD: 1. Moderately motion degraded examination. No acute intracranial process. 2. Stable parenchymal brain volume loss and moderate chronic small vessel ischemic changes. MRA HEAD: 1. Moderately motion degraded examination. No emergent large vessel occlusion. 2. Severe stenoses RIGHT M2 versus motion artifact. MRA NECK: 1. Negative noncontrast MRA neck. Electronically Signed   By: Awilda Metro M.D.   On: 08/13/2018 02:01   Ct Head Code Stroke Wo Contrast`  Result Date: 08/12/2018 CLINICAL DATA:  Code stroke. 84 y/o F; right-sided weakness, slurred speech, facial droop. EXAM: CT HEAD WITHOUT CONTRAST TECHNIQUE: Contiguous axial images were obtained from the base of the skull through the vertex without intravenous contrast. COMPARISON:  12/08/2017 CT head. FINDINGS: Brain: No evidence of acute infarction, hemorrhage, hydrocephalus, extra-axial collection or mass lesion/mass effect. Stable chronic microvascular ischemic changes and volume loss of the brain. Vascular: No hyperdense vessel or unexpected calcification. Skull: Normal. Negative for fracture or focal lesion. Sinuses/Orbits: Visible paranasal sinuses and the right mastoid air cells are normally aerated. Minimal opacification of left mastoid air cells. High-riding left jugular bulb with thin sigmoid plate. Bilateral intra-ocular lens replacement. Other: None. ASPECTS [facility]  Stroke Program Early CT Score) - Ganglionic level infarction (caudate, lentiform nuclei, internal capsule, insula, M1-M3 cortex): 7 - Supraganglionic infarction (M4-M6 cortex): 3 Total score (0-10 with 10 being normal): 10 IMPRESSION: 1. No acute intracranial abnormality identified. 2. Stable chronic microvascular ischemic changes and volume loss of the brain. 3. ASPECTS is 10. These results were communicated to Dr. Wilford Corner at 9:01 pmon 10/24/2019by text page via the [facility] messaging system. Electronically Signed   By: Mitzi Hansen M.D.   On: 08/12/2018 21:02    EKG: Not done in the ED.  Assessment/Plan Principal Problem:   Acute metabolic encephalopathy Active Problems:   Parkinson disease (HCC)   HTN (hypertension)   Bradycardia   CKD (chronic kidney disease), stage III (HCC)   Thrombocytopenia (HCC)   Anemia, chronic renal failure, stage 3 (moderate) (HCC)   Seizure (HCC)   Acute metabolic encephalopathy/ hx of seizures -Infection less likely as patient is afebrile and labs showing no leukocytosis. UA not suggestive of infection.  Chest x-ray negative for pneumonia. -Patient was seen by neurology (Dr. Wilford Corner).  Her presentation is thought to be more likely related to toxic metabolic encephalopathy vs seizure.  -Head CT negative for acute findings.  MRI brain showing no acute intracranial process.  MRA head showing no emergent large vessel occlusion; severe stenosis right M2 versus motion artifact.  MRA neck negative. -Continue IV Keppra 250 mg twice daily  -EEG -CBC, CMP, UA, urine toxicology -Ammonia, B12, TSH -Minimize sedating medications -Keep n.p.o. -SLP eval, PT eval   Anemia of CKD -Hemoglobin 10.5; hemoglobin was 11.4 eight months ago.  No signs of active bleeding. -Continue to monitor  Chronic thrombocytopenia -Platelets 126,000. No signs of active bleeding. -Continue to monitor  CKD 3 -Creatinine 2.0.  Baseline 1.6 to 1.8 a year ago. -Gentle IV  fluids -Continue to monitor  Chronic bradycardia -Heart rate in the 40s to 50s in the ED.  Not hypotensive. -Check TSH -Check EKG -Avoid AV nodal blocking agents  Parkinsons disease  -Hold home Sinemet and Aricept at this time as patient is very somnolent. SLP eval pending.   HTN -No home antihypertensives listed. Continue to monitor BP.  DVT prophylaxis: Subcutaneous heparin Code Status: DNR recorded previously on Feb 26, 2017.  This needs to be confirmed with the family tomorrow. Family Communication: No family present at bedside. Disposition Plan: Anticipate discharge to SNF in 1 to 2 days. Consults called: Neurology - Dr. Wilford Corner Admission status:  Observation    John Giovanni MD [facility] Pager [phone]  If 7PM-7AM, please contact night-coverage [email or web address] Password [email or web address]  08/13/2018, 3:31 AM

## 2018-08-13 NOTE — Evaluation (Signed)
Physical Therapy Evaluation Patient Details Name: Stacey Baker MRN: [id number] DOB: [birth year] Today's Date: 08/13/2018   History of Present Illness  Pt is an 84 y/o female admitted secondary to slurred speech and R sided weakness. CT and MRI were both negative for any acute findings. Pt found to have acute metabolic encephalopathy. PMH including but not limited to dementia, Parkinson's disease, CKD, HTN and TIAs.    Clinical Impression  Pt presented supine in bed with HOB elevated, initially asleep and lethargic throughout but willing to participate in therapy session. [name] son present throughout session as well and provided history information. Pt from a facility [facility]) where she uses a w/c for mobility, requires assistance for transfers and ADLs. Pt currently very limited secondary to fatigue, generalized weakness and lethargy. Of note, pt's HR mid 40's throughout. Pt would continue to benefit from skilled physical therapy services at this time while admitted and after d/c to address the below listed limitations in order to improve overall safety and independence with functional mobility.     Follow Up Recommendations SNF;Supervision/Assistance - 24 hour    Equipment Recommendations  None recommended by PT    Recommendations for Other Services       Precautions / Restrictions Precautions Precautions: Fall Restrictions Weight Bearing Restrictions: No      Mobility  Bed Mobility Overal bed mobility: Needs Assistance Bed Mobility: Supine to Sit;Sit to Supine     Supine to sit: Total assist;+2 for physical assistance Sit to supine: Total assist;+2 for physical assistance   General bed mobility comments: total A x2 for all aspects with use of bed pads   Transfers                 General transfer comment: deferred  Ambulation/Gait                Stairs            Wheelchair Mobility    Modified Rankin (Stroke Patients Only) Modified Rankin  (Stroke Patients Only) Pre-Morbid Rankin Score: Severe disability Modified Rankin: Severe disability     Balance Overall balance assessment: Needs assistance Sitting-balance support: Feet supported Sitting balance-Leahy Scale: Poor Sitting balance - Comments: max to total A to maintain sitting EOB with posterior and R lean Postural control: Right lateral lean;Posterior lean                                   Pertinent Vitals/Pain Pain Assessment: Faces Faces Pain Scale: No hurt    Home Living Family/patient expects to be discharged to:: Skilled nursing facility                 Additional Comments: From [place] per son's report    Prior Function Level of Independence: Needs assistance   Gait / Transfers Assistance Needed: requires assistance with transfers, uses a w/c for mobility, requires assistance to propel in w/c  ADL's / Homemaking Assistance Needed: requires assistance        Hand Dominance        Extremity/Trunk Assessment   Upper Extremity Assessment Upper Extremity Assessment: Generalized weakness    Lower Extremity Assessment Lower Extremity Assessment: Generalized weakness    Cervical / Trunk Assessment Cervical / Trunk Assessment: Kyphotic  Communication   Communication: No difficulties  Cognition Arousal/Alertness: Lethargic Behavior During Therapy: Flat affect Overall Cognitive Status: History of cognitive impairments - at baseline Area of Impairment: Attention;Memory;Following commands;Safety/judgement;Problem solving  Current Attention Level: Focused Memory: Decreased short-term memory Following Commands: Follows one step commands inconsistently;Follows one step commands with increased time Safety/Judgement: Decreased awareness of safety;Decreased awareness of deficits   Problem Solving: Slow processing;Decreased initiation;Difficulty sequencing;Requires verbal cues;Requires tactile cues         General Comments      Exercises     Assessment/Plan    PT Assessment Patient needs continued PT services  PT Problem List Decreased strength;Decreased range of motion;Decreased activity tolerance;Decreased balance;Decreased coordination;Decreased mobility;Decreased cognition;Decreased knowledge of use of DME;Decreased safety awareness;Decreased knowledge of precautions       PT Treatment Interventions DME instruction;Functional mobility training;Therapeutic activities;Therapeutic exercise;Balance training;Neuromuscular re-education;Cognitive remediation;Patient/family education    PT Goals (Current goals can be found in the Care Plan section)  Acute Rehab PT Goals Patient Stated Goal: per son - to get PT once back at [facility] PT Goal Formulation: With patient/family Time For Goal Achievement: 08/27/18 Potential to Achieve Goals: Fair    Frequency Min 2X/week   Barriers to discharge        Co-evaluation               AM-PAC PT "6 Clicks" Daily Activity  Outcome Measure Difficulty turning over in bed (including adjusting bedclothes, sheets and blankets)?: Unable Difficulty moving from lying on back to sitting on the side of the bed? : Unable Difficulty sitting down on and standing up from a chair with arms (e.g., wheelchair, bedside commode, etc,.)?: Unable Help needed moving to and from a bed to chair (including a wheelchair)?: Total Help needed walking in hospital room?: Total Help needed climbing 3-5 steps with a railing? : Total 6 Click Score: 6    End of Session   Activity Tolerance: Patient limited by fatigue Patient left: in bed;with call bell/phone within reach;with bed alarm set;with family/visitor present;Other (comment)(seizure precautions) Nurse Communication: Mobility status;Need for lift equipment PT Visit Diagnosis: Other abnormalities of gait and mobility (R26.89);Muscle weakness (generalized) (M62.81)    Time: 1610-9604 PT Time Calculation  (min) (ACUTE ONLY): 25 min   Charges:   PT Evaluation $PT Eval Moderate Complexity: 1 Mod PT Treatments $Therapeutic Activity: 8-22 mins        Deborah Chalk, PT, DPT  Acute Rehabilitation Services Pager [phone] Office [phone]    Alessandra Bevels Alok Minshall 08/13/2018, 2:35 PM

## 2018-08-13 NOTE — Progress Notes (Signed)
Stacey Baker is a 84 y.o. female with medical history significant of stroke with right-sided weakness and aphasia, dementia, seizure (on renal dose of Keppra), CKD 3, hyperlipidemia, hypertension, depression, Parkinson's disease, sinus bradycardia presenting to the hospital for further evaluation of right facial droop and slurred speech.  Patient very somnolent and nonverbal. Neurology consulted, EEG ordered showed mild ot mod cerebral dysfunction, no epileptiform activity seen.    Plan: Continue with AED, work with therapy, hydrate and start her on dysphagia 1 diet.  Will monitor.   Patient was admitted earlier this am. Please see Dr Gardiner Rhyme note for details.    Stacey Mody, MD [phone]

## 2018-08-13 NOTE — Progress Notes (Signed)
EEG Completed; Results Pending  

## 2018-08-14 DIAGNOSIS — N183 Chronic kidney disease, stage 3 (moderate): Secondary | ICD-10-CM

## 2018-08-14 DIAGNOSIS — D631 Anemia in chronic kidney disease: Secondary | ICD-10-CM

## 2018-08-14 DIAGNOSIS — G20 Parkinson's disease: Secondary | ICD-10-CM

## 2018-08-14 LAB — BASIC METABOLIC PANEL
Anion gap: 10 (ref 5–15)
BUN: 23 mg/dL (ref 8–23)
CO2: 19 mmol/L — AB (ref 22–32)
Calcium: 8.5 mg/dL — ABNORMAL LOW (ref 8.9–10.3)
Chloride: 112 mmol/L — ABNORMAL HIGH (ref 98–111)
Creatinine, Ser: 1.53 mg/dL — ABNORMAL HIGH (ref 0.44–1.00)
GFR, EST AFRICAN AMERICAN: 35 mL/min — AB (ref >60)
GFR, EST NON AFRICAN AMERICAN: 30 mL/min — AB (ref >60)
GLUCOSE: 103 mg/dL — AB (ref 70–99)
Potassium: 4.4 mmol/L (ref 3.5–5.1)
Sodium: 141 mmol/L (ref 135–145)

## 2018-08-14 MED ORDER — DONEPEZIL HCL 10 MG PO TABS
10.0000 mg | ORAL_TABLET | Freq: Every day | ORAL | Status: DC
  Administered 2018-08-14 – 2018-08-15 (×2): 10 mg via ORAL
  Filled 2018-08-14 (×2): qty 1

## 2018-08-14 MED ORDER — LEVETIRACETAM 250 MG PO TABS: 250.0000 mg | ORAL_TABLET | Freq: Two times a day (BID) | ORAL | 0 refills | Status: DC

## 2018-08-14 MED ORDER — ESCITALOPRAM OXALATE 10 MG PO TABS
10.0000 mg | ORAL_TABLET | Freq: Every day | ORAL | Status: DC
  Administered 2018-08-14 – 2018-08-16 (×3): 10 mg via ORAL
  Filled 2018-08-14 (×3): qty 1

## 2018-08-14 MED ORDER — QUETIAPINE FUMARATE 25 MG PO TABS
25.0000 mg | ORAL_TABLET | Freq: Every day | ORAL | Status: DC
  Administered 2018-08-14 – 2018-08-15 (×2): 25 mg via ORAL
  Filled 2018-08-14 (×2): qty 1

## 2018-08-14 MED ORDER — FLUTICASONE FUROATE-VILANTEROL 100-25 MCG/INH IN AEPB
1.0000 | INHALATION_SPRAY | Freq: Every day | RESPIRATORY_TRACT | Status: DC
  Administered 2018-08-14: 1 via RESPIRATORY_TRACT
  Filled 2018-08-14: qty 28

## 2018-08-14 MED ORDER — CARBIDOPA-LEVODOPA 25-100 MG PO TABS
1.0000 | ORAL_TABLET | Freq: Three times a day (TID) | ORAL | Status: DC
  Administered 2018-08-14 – 2018-08-16 (×7): 1 via ORAL
  Filled 2018-08-14 (×7): qty 1

## 2018-08-14 MED ORDER — RESOURCE THICKENUP CLEAR PO POWD: ORAL | Status: DC

## 2018-08-14 MED ORDER — UMECLIDINIUM BROMIDE 62.5 MCG/INH IN AEPB
1.0000 | INHALATION_SPRAY | Freq: Every day | RESPIRATORY_TRACT | Status: DC
  Administered 2018-08-14: 1 via RESPIRATORY_TRACT
  Filled 2018-08-14: qty 7

## 2018-08-14 NOTE — Social Work (Addendum)
CSW unable to place pt today at SNF, requires medically necessary 3 night stay. Have spoken with MD regarding discharge planning, pt close to baseline and will not qualify for 3 midnight stay. Will reach out to [facility] where pt is a resident to see if they are able to take pt back during the weekend.   12:58- unable to reach any staff at [facility] [phone] to discuss pt return. Phone rang continuously with no voicemail.  2:41pm- have attempted to reach [facility] x2 with no futher success. Pt son aware of discharge planning and amenable to pt returning to [place]. Requested MD call him as nobody has called pt son with any updates or information. Will page MD.   Octavio Graves, MSW, [facility] Clinical Social Work

## 2018-08-14 NOTE — Discharge Summary (Signed)
Physician Discharge Summary  Stacey Baker ZOX:[id number] DOB: February 02, 1932 DOA: 08/12/2018  PCP: Merlene Pulling, PA-C  Admit date: 08/12/2018 Discharge date: 08/14/2018  Admitted From: SNF Disposition:  SNF  Recommendations for Outpatient Follow-up:  1. Follow up with PCP in 1-2 weeks 2. Please obtain BMP/CBC in one week  Discharge Condition:Stable.  CODE STATUS: DNR Diet recommendation: Heart Healthy, dysphagia 1 diet with nectar thick fluids  Brief/Interim Summary: Stacey Baker a 84 y.o.femalewith medical history significant ofstroke with right-sided weakness and aphasia, dementia, seizure (on renal dose of Keppra), CKD 3, hyperlipidemia, hypertension, depression, Parkinson's disease, sinus bradycardia presenting to the hospital for further evaluation of right facial droop and slurred speech.   Discharge Diagnoses:  Principal Problem:   Acute metabolic encephalopathy Active Problems:   Parkinson disease (HCC)   HTN (hypertension)   Bradycardia   CKD (chronic kidney disease), stage III (HCC)   Thrombocytopenia (HCC)   Anemia, chronic renal failure, stage 3 (moderate) (HCC)   Seizure (HCC)  ACUTE METABOLIC ENCEPHALOPATHY: Appears to have resolved. She is alert and able to answer simple questions. PT eval recommending SNF.  Social work to assist in placement.    Parkinson's disease Resume Sinemet   Hypertension Well controlled resume home meds.   Acute on stage III CKD Much improvement with hydration.   Anemia of chronic disease hemoglobin stable between 9-10.   Seizures Neurology increased Keppra to 250 mg twice daily.   Chronic thrombocytopenia Platelets stable at 115.  No overt bleeding.   Discharge Instructions  Discharge Instructions    Diet - low sodium heart healthy   Complete by:  As directed    Discharge instructions   Complete by:  As directed    PLEASE FOLLOW UP WITH HOSPICE AS RECOMMENDED.     Allergies as of 08/14/2018       Reactions   Penicillins Other (See Comments)   "Allergic," per [facility] Has patient had a PCN reaction causing immediate rash, facial/tongue/throat swelling, SOB or lightheadedness with hypotension: Unk Has patient had a PCN reaction causing severe rash involving mucus membranes or skin necrosis: Unk Has patient had a PCN reaction that required hospitalization: Unk Has patient had a PCN reaction occurring within the last 10 years: Unk If all of the above answers are "NO", then may proceed with Cephalosporin use.   Plavix [clopidogrel Bisulfate] Other (See Comments)   "Allergic," per [facility]      Medication List    TAKE these medications   acetaminophen 325 MG tablet Commonly known as:  TYLENOL Take 650 mg by mouth every 4 (four) hours as needed.   amLODipine 10 MG tablet Commonly known as:  NORVASC Take 10 mg by mouth daily.   aspirin EC 325 MG tablet Take 325 mg by mouth daily.   BAZA ANTIFUNGAL 2 % cream Generic drug:  miconazole Apply 1 application topically 2 (two) times daily as needed. To sacrum every shift for redness after each toilet   BREO ELLIPTA 100-25 MCG/INH Aepb Generic drug:  fluticasone furoate-vilanterol Inhale 1 puff into the lungs daily.   calcitRIOL 0.25 MCG capsule Commonly known as:  ROCALTROL Take 0.25 mcg by mouth daily.   carbidopa-levodopa 25-100 MG tablet Commonly known as:  SINEMET IR Take 1 tablet by mouth 3 (three) times daily.   donepezil 10 MG tablet Commonly known as:  ARICEPT Take 10 mg by mouth at bedtime.   EQ SALINE SOLUTION/SENSITIVE Soln Apply 1 application topically every other day. Apply to neck topically once every  other day for wound until healed.   escitalopram 10 MG tablet Commonly known as:  LEXAPRO Take 10 mg by mouth daily.   ferrous sulfate 325 (65 FE) MG tablet Take 325 mg by mouth daily.   INCRUSE ELLIPTA 62.5 MCG/INH Aepb Generic drug:  umeclidinium bromide Inhale 1 puff into the lungs daily.   levETIRAcetam 250  MG tablet Commonly known as:  KEPPRA Take 1 tablet (250 mg total) by mouth 2 (two) times daily. What changed:  when to take this   loperamide 2 MG capsule Commonly known as:  IMODIUM Take by mouth as needed for diarrhea or loose stools.   LORazepam 0.5 MG tablet Commonly known as:  ATIVAN Take 0.5 mg by mouth every 4 (four) hours as needed for anxiety.   MIRALAX PO Take 17 g by mouth as needed (Constipation).   morphine 20 MG/ML concentrated solution Commonly known as:  ROXANOL Take 0.25 mg by mouth every hour as needed for severe pain.   PRESERVISION AREDS 2 PO Take 2 capsules by mouth daily.   PROBIOTIC DAILY Caps Take 1 capsule by mouth daily.   QUEtiapine 25 MG tablet Commonly known as:  SEROQUEL Take 25 mg by mouth at bedtime.   RESOURCE THICKENUP CLEAR Powd WITH EVERY MEAL      Follow-up Information    Merlene Pulling, PA-C. Schedule an appointment as soon as possible for a visit in 1 week(s).   Specialty:  Physician Assistant Contact information: [address] [place] [address] [phone]          Allergies  Allergen Reactions  . Penicillins Other (See Comments)    "Allergic," per [facility] Has patient had a PCN reaction causing immediate rash, facial/tongue/throat swelling, SOB or lightheadedness with hypotension: Unk Has patient had a PCN reaction causing severe rash involving mucus membranes or skin necrosis: Unk Has patient had a PCN reaction that required hospitalization: Unk Has patient had a PCN reaction occurring within the last 10 years: Unk If all of the above answers are "NO", then may proceed with Cephalosporin use.   Marland Kitchen Plavix [Clopidogrel Bisulfate] Other (See Comments)    "Allergic," per [facility]    Consultations:  Neurology   Procedures/Studies: Dg Chest 2 View  Result Date: 08/12/2018 CLINICAL DATA:  84 y/o  F; mental status change. EXAM: CHEST - 2 VIEW COMPARISON:  12/08/2017 chest radiograph. FINDINGS:  Stable cardiomegaly given projection and technique. Stable small bilateral pleural effusions. Mild reticular opacities, probably interstitial edema. No consolidation or pneumothorax. Stable chronic moderate L1 compression deformity. No acute osseous abnormality identified. IMPRESSION: Stable cardiomegaly and small bilateral effusions. Mild interstitial edema. Electronically Signed   By: Mitzi Hansen M.D.   On: 08/12/2018 22:18   Mr Maxine Glenn Head Wo Contrast  Result Date: 08/13/2018 CLINICAL DATA:  Gait imbalance, RIGHT-sided weakness and facial droop. Last seen normal at 5:30 p.m. Follow up code stroke. History of stroke, dementia, seizure, hypertension, Parkinson's disease. EXAM: MRI HEAD WITHOUT CONTRAST MRA HEAD WITHOUT CONTRAST MRA NECK WITHOUT CONTRAST TECHNIQUE: Multiplanar, multiecho pulse sequences of the brain and surrounding structures were obtained without intravenous contrast. Angiographic images of the Circle of Willis were obtained using MRA technique without intravenous contrast. Angiographic images of the neck were obtained using MRA technique without intravenous contrast. Carotid stenosis measurements (when applicable) are obtained utilizing NASCET criteria, using the distal internal carotid diameter as the denominator. COMPARISON:  CT HEAD August 12, 2018 and MRI head Mar 02, 2017 FINDINGS: MRI HEAD  FINDINGS-moderately motion degraded examination. INTRACRANIAL CONTENTS: No reduced diffusion to suggest acute ischemia. No susceptibility artifact to suggest hemorrhage. Moderate to severe parenchymal brain volume loss. No hydrocephalus. Patchy supratentorial white matter FLAIR T2 hyperintensities compatible with moderate chronic small vessel ischemic changes. No suspicious parenchymal signal, masses, mass effect. No abnormal extra-axial fluid collections. No extra-axial masses. VASCULAR: Normal major intracranial vascular flow voids present at skull base. SKULL AND UPPER CERVICAL SPINE: No  abnormal sellar expansion. No suspicious calvarial bone marrow signal. Craniocervical junction maintained. SINUSES/ORBITS: Trace bilateral mastoid effusions. Paranasal sinuses are well aerated. Included ocular globes and orbital contents are non-suspicious. Status post bilateral ocular lens implants. OTHER: None. MRA HEAD FINDINGS-moderately motion degraded examination. ANTERIOR CIRCULATION: Normal flow related enhancement of the included cervical, petrous, cavernous and supraclinoid internal carotid arteries. Patent anterior communicating artery. Patent anterior and middle cerebral arteries. Severe stenosis versus motion artifact RIGHT M2. No large vessel occlusion, flow limiting stenosis, aneurysm. POSTERIOR CIRCULATION: Codominant vertebral arteries. Vertebrobasilar arteries are patent, with normal flow related enhancement of the main branch vessels. Patent posterior cerebral arteries. Robust LEFT posterior communicating artery present. No large vessel occlusion, flow limiting stenosis,  aneurysm. ANATOMIC VARIANTS: None. Source images and MIP images were reviewed. MRA NECK FINDINGS-limited assessment of arch vessel origins by noncontrast MRA. ANTERIOR CIRCULATION: The common carotid arteries are widely patent bilaterally. The carotid bifurcations are patent bilaterally without hemodynamically significant stenosis by NASCET criteria. No flow limiting stenosis or luminal irregularity. POSTERIOR CIRCULATION: Bilateral vertebral arteries are patent to the vertebrobasilar junction. No flow limiting stenosis or luminal irregularity. Source images and MIP images were reviewed. IMPRESSION: MRI HEAD: 1. Moderately motion degraded examination. No acute intracranial process. 2. Stable parenchymal brain volume loss and moderate chronic small vessel ischemic changes. MRA HEAD: 1. Moderately motion degraded examination. No emergent large vessel occlusion. 2. Severe stenoses RIGHT M2 versus motion artifact. MRA NECK: 1.  Negative noncontrast MRA neck. Electronically Signed   By: Awilda Metro M.D.   On: 08/13/2018 02:01   Mr Maxine Glenn Neck Wo Contrast  Result Date: 08/13/2018 CLINICAL DATA:  Gait imbalance, RIGHT-sided weakness and facial droop. Last seen normal at 5:30 p.m. Follow up code stroke. History of stroke, dementia, seizure, hypertension, Parkinson's disease. EXAM: MRI HEAD WITHOUT CONTRAST MRA HEAD WITHOUT CONTRAST MRA NECK WITHOUT CONTRAST TECHNIQUE: Multiplanar, multiecho pulse sequences of the brain and surrounding structures were obtained without intravenous contrast. Angiographic images of the Circle of Willis were obtained using MRA technique without intravenous contrast. Angiographic images of the neck were obtained using MRA technique without intravenous contrast. Carotid stenosis measurements (when applicable) are obtained utilizing NASCET criteria, using the distal internal carotid diameter as the denominator. COMPARISON:  CT HEAD August 12, 2018 and MRI head Mar 02, 2017 FINDINGS: MRI HEAD FINDINGS-moderately motion degraded examination. INTRACRANIAL CONTENTS: No reduced diffusion to suggest acute ischemia. No susceptibility artifact to suggest hemorrhage. Moderate to severe parenchymal brain volume loss. No hydrocephalus. Patchy supratentorial white matter FLAIR T2 hyperintensities compatible with moderate chronic small vessel ischemic changes. No suspicious parenchymal signal, masses, mass effect. No abnormal extra-axial fluid collections. No extra-axial masses. VASCULAR: Normal major intracranial vascular flow voids present at skull base. SKULL AND UPPER CERVICAL SPINE: No abnormal sellar expansion. No suspicious calvarial bone marrow signal. Craniocervical junction maintained. SINUSES/ORBITS: Trace bilateral mastoid effusions. Paranasal sinuses are well aerated. Included ocular globes and orbital contents are non-suspicious. Status post bilateral ocular lens implants. OTHER: None. MRA HEAD  FINDINGS-moderately motion degraded examination. ANTERIOR CIRCULATION: Normal flow related enhancement  of the included cervical, petrous, cavernous and supraclinoid internal carotid arteries. Patent anterior communicating artery. Patent anterior and middle cerebral arteries. Severe stenosis versus motion artifact RIGHT M2. No large vessel occlusion, flow limiting stenosis, aneurysm. POSTERIOR CIRCULATION: Codominant vertebral arteries. Vertebrobasilar arteries are patent, with normal flow related enhancement of the main branch vessels. Patent posterior cerebral arteries. Robust LEFT posterior communicating artery present. No large vessel occlusion, flow limiting stenosis,  aneurysm. ANATOMIC VARIANTS: None. Source images and MIP images were reviewed. MRA NECK FINDINGS-limited assessment of arch vessel origins by noncontrast MRA. ANTERIOR CIRCULATION: The common carotid arteries are widely patent bilaterally. The carotid bifurcations are patent bilaterally without hemodynamically significant stenosis by NASCET criteria. No flow limiting stenosis or luminal irregularity. POSTERIOR CIRCULATION: Bilateral vertebral arteries are patent to the vertebrobasilar junction. No flow limiting stenosis or luminal irregularity. Source images and MIP images were reviewed. IMPRESSION: MRI HEAD: 1. Moderately motion degraded examination. No acute intracranial process. 2. Stable parenchymal brain volume loss and moderate chronic small vessel ischemic changes. MRA HEAD: 1. Moderately motion degraded examination. No emergent large vessel occlusion. 2. Severe stenoses RIGHT M2 versus motion artifact. MRA NECK: 1. Negative noncontrast MRA neck. Electronically Signed   By: Awilda Metro M.D.   On: 08/13/2018 02:01   Mr Brain Wo Contrast  Result Date: 08/13/2018 CLINICAL DATA:  Gait imbalance, RIGHT-sided weakness and facial droop. Last seen normal at 5:30 p.m. Follow up code stroke. History of stroke, dementia, seizure,  hypertension, Parkinson's disease. EXAM: MRI HEAD WITHOUT CONTRAST MRA HEAD WITHOUT CONTRAST MRA NECK WITHOUT CONTRAST TECHNIQUE: Multiplanar, multiecho pulse sequences of the brain and surrounding structures were obtained without intravenous contrast. Angiographic images of the Circle of Willis were obtained using MRA technique without intravenous contrast. Angiographic images of the neck were obtained using MRA technique without intravenous contrast. Carotid stenosis measurements (when applicable) are obtained utilizing NASCET criteria, using the distal internal carotid diameter as the denominator. COMPARISON:  CT HEAD August 12, 2018 and MRI head Mar 02, 2017 FINDINGS: MRI HEAD FINDINGS-moderately motion degraded examination. INTRACRANIAL CONTENTS: No reduced diffusion to suggest acute ischemia. No susceptibility artifact to suggest hemorrhage. Moderate to severe parenchymal brain volume loss. No hydrocephalus. Patchy supratentorial white matter FLAIR T2 hyperintensities compatible with moderate chronic small vessel ischemic changes. No suspicious parenchymal signal, masses, mass effect. No abnormal extra-axial fluid collections. No extra-axial masses. VASCULAR: Normal major intracranial vascular flow voids present at skull base. SKULL AND UPPER CERVICAL SPINE: No abnormal sellar expansion. No suspicious calvarial bone marrow signal. Craniocervical junction maintained. SINUSES/ORBITS: Trace bilateral mastoid effusions. Paranasal sinuses are well aerated. Included ocular globes and orbital contents are non-suspicious. Status post bilateral ocular lens implants. OTHER: None. MRA HEAD FINDINGS-moderately motion degraded examination. ANTERIOR CIRCULATION: Normal flow related enhancement of the included cervical, petrous, cavernous and supraclinoid internal carotid arteries. Patent anterior communicating artery. Patent anterior and middle cerebral arteries. Severe stenosis versus motion artifact RIGHT M2. No large  vessel occlusion, flow limiting stenosis, aneurysm. POSTERIOR CIRCULATION: Codominant vertebral arteries. Vertebrobasilar arteries are patent, with normal flow related enhancement of the main branch vessels. Patent posterior cerebral arteries. Robust LEFT posterior communicating artery present. No large vessel occlusion, flow limiting stenosis,  aneurysm. ANATOMIC VARIANTS: None. Source images and MIP images were reviewed. MRA NECK FINDINGS-limited assessment of arch vessel origins by noncontrast MRA. ANTERIOR CIRCULATION: The common carotid arteries are widely patent bilaterally. The carotid bifurcations are patent bilaterally without hemodynamically significant stenosis by NASCET criteria. No flow limiting stenosis or luminal irregularity. POSTERIOR CIRCULATION:  Bilateral vertebral arteries are patent to the vertebrobasilar junction. No flow limiting stenosis or luminal irregularity. Source images and MIP images were reviewed. IMPRESSION: MRI HEAD: 1. Moderately motion degraded examination. No acute intracranial process. 2. Stable parenchymal brain volume loss and moderate chronic small vessel ischemic changes. MRA HEAD: 1. Moderately motion degraded examination. No emergent large vessel occlusion. 2. Severe stenoses RIGHT M2 versus motion artifact. MRA NECK: 1. Negative noncontrast MRA neck. Electronically Signed   By: Awilda Metro M.D.   On: 08/13/2018 02:01   Ct Head Code Stroke Wo Contrast`  Result Date: 08/12/2018 CLINICAL DATA:  Code stroke. 84 y/o F; right-sided weakness, slurred speech, facial droop. EXAM: CT HEAD WITHOUT CONTRAST TECHNIQUE: Contiguous axial images were obtained from the base of the skull through the vertex without intravenous contrast. COMPARISON:  12/08/2017 CT head. FINDINGS: Brain: No evidence of acute infarction, hemorrhage, hydrocephalus, extra-axial collection or mass lesion/mass effect. Stable chronic microvascular ischemic changes and volume loss of the brain. Vascular:  No hyperdense vessel or unexpected calcification. Skull: Normal. Negative for fracture or focal lesion. Sinuses/Orbits: Visible paranasal sinuses and the right mastoid air cells are normally aerated. Minimal opacification of left mastoid air cells. High-riding left jugular bulb with thin sigmoid plate. Bilateral intra-ocular lens replacement. Other: None. ASPECTS [facility] Stroke Program Early CT Score) - Ganglionic level infarction (caudate, lentiform nuclei, internal capsule, insula, M1-M3 cortex): 7 - Supraganglionic infarction (M4-M6 cortex): 3 Total score (0-10 with 10 being normal): 10 IMPRESSION: 1. No acute intracranial abnormality identified. 2. Stable chronic microvascular ischemic changes and volume loss of the brain. 3. ASPECTS is 10. These results were communicated to Dr. Wilford Corner at 9:01 pmon 10/24/2019by text page via the [facility] messaging system. Electronically Signed   By: Mitzi Hansen M.D.   On: 08/12/2018 21:02       Subjective:  She denies any complaints appears comfortable. Discharge Exam: Vitals:   08/14/18 1006 08/14/18 1010  BP:    Pulse:    Resp:    Temp:    SpO2: 95% 95%   Vitals:   08/14/18 0336 08/14/18 0758 08/14/18 1006 08/14/18 1010  BP: (!) 144/93 (!) 158/92    Pulse: 60 (!) 55    Resp: 16 15    Temp: (!) 97.5 F (36.4 C) 97.6 F (36.4 C)    TempSrc: Oral Oral    SpO2: 95% 95% 95% 95%  Weight:        General: Pt is alert, awake, not in acute distress Cardiovascular: RRR, S1/S2 +, no rubs, no gallops Respiratory: CTA bilaterally, no wheezing, no rhonchi Abdominal: Soft, NT, ND, bowel sounds + Extremities: no edema, no cyanosis    The results of significant diagnostics from this hospitalization (including imaging, microbiology, ancillary and laboratory) are listed below for reference.     Microbiology: Recent Results (from the past 240 hour(s))  MRSA PCR Screening     Status: None   Collection Time: 08/13/18  3:14 AM  Result Value Ref  Range Status   MRSA by PCR NEGATIVE NEGATIVE Final    Comment:        The GeneXpert MRSA Assay (FDA approved for NASAL specimens only), is one component of a comprehensive MRSA colonization surveillance program. It is not intended to diagnose MRSA infection nor to guide or monitor treatment for MRSA infections. Performed at [facility], [address], [place], [place] [address]      Labs: BNP (last 3 results) No results for input(s): BNP in the  last 8760 hours. Basic Metabolic Panel: Recent Labs  Lab 08/12/18 2043 08/12/18 2056 08/13/18 0420  NA 142 141 142  K 4.5 4.6 3.9  CL 108 107 108  CO2 24  --  29  GLUCOSE 114* 114* 99  BUN 32* 34* 29*  CREATININE 2.03* 2.20* 1.82*  CALCIUM 9.1  --  8.7*   Liver Function Tests: Recent Labs  Lab 08/12/18 2043 08/13/18 0420  AST 11* 10*  ALT <5 <5  ALKPHOS 71 63  BILITOT 0.5 0.6  PROT 6.5 5.5*  ALBUMIN 3.6 3.0*   No results for input(s): LIPASE, AMYLASE in the last 168 hours. Recent Labs  Lab 08/13/18 0420  AMMONIA 9   CBC: Recent Labs  Lab 08/12/18 2043 08/12/18 2056 08/13/18 0420  WBC 4.7  --  4.1  NEUTROABS 2.5  --   --   HGB 10.5* 10.5* 9.8*  HCT 33.8* 31.0* 31.7*  MCV 94.4  --  93.8  PLT 126*  --  115*   Cardiac Enzymes: No results for input(s): CKTOTAL, CKMB, CKMBINDEX, TROPONINI in the last 168 hours. BNP: Invalid input(s): POCBNP CBG: No results for input(s): GLUCAP in the last 168 hours. D-Dimer No results for input(s): DDIMER in the last 72 hours. Hgb A1c No results for input(s): HGBA1C in the last 72 hours. Lipid Profile No results for input(s): CHOL, HDL, LDLCALC, TRIG, CHOLHDL, LDLDIRECT in the last 72 hours. Thyroid function studies Recent Labs    08/13/18 0420  TSH 2.496   Anemia work up Recent Labs    08/13/18 0420  VITAMINB12 819   Urinalysis    Component Value Date/Time   COLORURINE YELLOW 08/12/2018 2305   APPEARANCEUR CLEAR 08/12/2018 2305   APPEARANCEUR  Cloudy 01/12/2015 2035   LABSPEC 1.013 08/12/2018 2305   LABSPEC 1.018 01/12/2015 2035   PHURINE 6.0 08/12/2018 2305   GLUCOSEU NEGATIVE 08/12/2018 2305   GLUCOSEU Negative 01/12/2015 2035   HGBUR NEGATIVE 08/12/2018 2305   BILIRUBINUR NEGATIVE 08/12/2018 2305   BILIRUBINUR Negative 01/12/2015 2035   KETONESUR NEGATIVE 08/12/2018 2305   PROTEINUR 30 (A) 08/12/2018 2305   NITRITE NEGATIVE 08/12/2018 2305   LEUKOCYTESUR NEGATIVE 08/12/2018 2305   LEUKOCYTESUR 3+ 01/12/2015 2035   Sepsis Labs Invalid input(s): PROCALCITONIN,  WBC,  LACTICIDVEN Microbiology Recent Results (from the past 240 hour(s))  MRSA PCR Screening     Status: None   Collection Time: 08/13/18  3:14 AM  Result Value Ref Range Status   MRSA by PCR NEGATIVE NEGATIVE Final    Comment:        The GeneXpert MRSA Assay (FDA approved for NASAL specimens only), is one component of a comprehensive MRSA colonization surveillance program. It is not intended to diagnose MRSA infection nor to guide or monitor treatment for MRSA infections. Performed at [facility], [address], [place], [place] [address]      Time coordinating discharge: 32 minutes  SIGNED:   Kathlen Mody, MD  [facility] 08/14/2018, 10:14 AM Pager   If 7PM-7AM, please contact night-coverage [email or web address] Password [email or web address]

## 2018-08-14 NOTE — Progress Notes (Signed)
April from [facility] called stating that is being followed by hospice services.  There is no auth on chart giving authorization to release information, advised this information

## 2018-08-14 NOTE — Clinical Social Work Note (Signed)
Clinical Social Work Assessment  Patient Details  Name: Stacey Baker MRN: [id number] Date of Birth: 1932/06/14  Date of referral:  08/14/18               Reason for consult:  Facility Placement, Discharge Planning                Permission sought to share information with:  Family Supports, Magazine features editor Permission granted to share information::  No(pt disoriented)  Name::     Conservation officer, nature::  [facility]  Relationship::  son/POA  Contact Information:  [phone]  Housing/Transportation Living arrangements for the past 2 months:  Assisted Living Facility Source of Information:  Adult Children Patient Interpreter Needed:  None Criminal Activity/Legal Involvement Pertinent to Current Situation/Hospitalization:  No - Comment as needed Significant Relationships:  Adult Children, Other Family Members, Community Support Lives with:  Facility Resident Do you feel safe going back to the place where you live?  Yes Need for family participation in patient care:  Yes (Comment)  Care giving concerns:  Pt from [facility]. Pt was taken to [facility] for further work up. Pt son would like for pt to return to ALF.   Social Worker assessment / plan:  CSW spoke with pt son on telephone, pt son at work and states that he was concerned because last night pt was taken to [facility] when she usually goes to [employer]. CSW explained that likely there was a specialist here at hospital that she was recommended to see and that is why she was brought here. Pt from ALF and pt son would like for her to return. Pt son requesting to talk to MD regarding pt status, will continue to follow until we can get in contact with ALF.   Employment status:  Retired Health and safety inspector:  [employer] PT Recommendations:  Skilled Holiday representative, 24 Hour Supervision Information / Referral to community resources:  Other (Comment Required)(ALF)  Patient/Family's Response to  care:  Pt son grateful for CSW call as he had not gotten any communication from MD staff.Pt son requests pt return to ALF.  Patient/Family's Understanding of and Emotional Response to Diagnosis, Current Treatment, and Prognosis:  Pt son states understanding of diagnosis, current treatment and prognosis. Pt son emotionally appropriate and requests to speak further with MD here at the hospital since he has not received any communication from staff.  Emotional Assessment Appearance:  Appears stated age Attitude/Demeanor/Rapport:  Unable to Assess Affect (typically observed):  Unable to Assess Orientation:  Oriented to Self, Fluctuating Orientation (Suspected and/or reported Sundowners) Alcohol / Substance use:  Not Applicable Psych involvement (Current and /or in the community):  No (Comment)  Discharge Needs  Concerns to be addressed:  Care Coordination Readmission within the last 30 days:  No Current discharge risk:  Dependent with Mobility, Cognitively Impaired Barriers to Discharge:  Continued Medical Work up   [employer], LCSWA 08/14/2018, 2:41 PM

## 2018-08-14 NOTE — [place] FL2 (Signed)
[place] MEDICAID [id number] LEVEL OF CARE SCREENING TOOL     IDENTIFICATION  Patient Name: Stacey Baker Birthdate: 1931/11/20 Sex: female Admission Date (Current Location): 08/12/2018  [facility] and [place] Number:  Chiropodist and Address:  [facility] [place], [address], [place], [place] [address]      Provider Number: [id number]  Attending Physician Name and Address:  Kathlen Mody, MD  Relative Name and Phone Number:  Willa Brocks, son, [phone]    Current Level of Care: Hospital Recommended Level of Care: Memory Care Prior Approval Number:    Date Approved/Denied:   PASRR Number:    Discharge Plan: Other (Comment)([facility])    Current Diagnoses: Patient Active Problem List   Diagnosis Date Noted  . Acute metabolic encephalopathy 08/13/2018  . Seizure (HCC) 08/12/2018  . Expressive aphasia   . Palliative care encounter   . Goals of care, counseling/discussion   . Acute on chronic renal failure (HCC) 02/26/2017  . Anemia, chronic renal failure, stage 3 (moderate) (HCC) 02/26/2017  . Guaiac positive stools 02/26/2017  . Fall 02/26/2017  . Hypoxia   . Atrial tachycardia (HCC)   . Generalized weakness 07/05/2016  . Thrombocytopenia (HCC) 07/05/2016  . UTI (urinary tract infection) 07/05/2016  . Acute respiratory failure (HCC) 07/05/2016  . Pleural effusion 07/05/2016  . CKD (chronic kidney disease), stage III (HCC)   . Pericardial effusion   . Sinus bradycardia   . Acute renal failure (ARF) (HCC) 12/31/2015  . Chronic renal insufficiency 09/15/2015  . Bradycardia 09/15/2015  . Cellulitis 09/13/2015  . Parkinson disease (HCC) 09/13/2015  . HTN (hypertension) 09/13/2015  . HLD (hyperlipidemia) 09/13/2015  . Depression 09/13/2015  . Dementia (HCC) 09/13/2015  . Hyperkalemia 09/13/2015  . TIA (transient ischemic attack) 08/27/2015    Orientation RESPIRATION BLADDER Height & Weight     Self  Normal Incontinent  Weight: 129 lb 6.6 oz (58.7 kg) Height:     BEHAVIORAL SYMPTOMS/MOOD NEUROLOGICAL BOWEL NUTRITION STATUS      Continent Diet(Dysphagia 1 (Puree);Nectar-thick liquid )  AMBULATORY STATUS COMMUNICATION OF NEEDS Skin   Extensive Assist Verbally Other (Comment)(MASD on buttocks)                       Personal Care Assistance Level of Assistance  Bathing, Feeding, Dressing Bathing Assistance: Maximum assistance Feeding assistance: Independent Dressing Assistance: Maximum assistance     Functional Limitations Info  Sight, Hearing, Speech Sight Info: Adequate Hearing Info: Adequate Speech Info: Adequate    SPECIAL CARE FACTORS FREQUENCY  PT (By licensed PT), OT (By licensed OT)     PT Frequency: 3x week OT Frequency: 2x week            Contractures Contractures Info: Not present    Additional Factors Info  Code Status, Allergies, Psychotropic Code Status Info: DNR Allergies Info: PENICILLINS, PLAVIX CLOPIDOGREL BISULFATE  Psychotropic Info: donepezil (ARICEPT) tablet 10 mg daily at bedtime PO; escitalopram (LEXAPRO) tablet 10 mg daily PO; QUEtiapine (SEROQUEL) tablet 25 mg daily at bedtime PO         Current Medications (08/14/2018):  This is the current hospital active medication list Current Facility-Administered Medications  Medication Dose Route Frequency Provider Last Rate Last Dose  . 0.9 %  sodium chloride infusion   Intravenous Continuous John Giovanni, MD 75 mL/hr at 08/13/18 2145    . carbidopa-levodopa (SINEMET IR) 25-100 MG per tablet immediate release 1 tablet  1 tablet Oral TID Blake Divine,  Edson Snowball, MD   1 tablet at 08/14/18 1030  . donepezil (ARICEPT) tablet 10 mg  10 mg Oral QHS Kathlen Mody, MD      . escitalopram (LEXAPRO) tablet 10 mg  10 mg Oral Daily Kathlen Mody, MD   10 mg at 08/14/18 1100  . fluticasone furoate-vilanterol (BREO ELLIPTA) 100-25 MCG/INH 1 puff  1 puff Inhalation Daily Kathlen Mody, MD   1 puff at 08/14/18 1006  . heparin  injection 5,000 Units  5,000 Units Subcutaneous Q8H John Giovanni, MD   5,000 Units at 08/14/18 1439  . levETIRAcetam (KEPPRA) 250 mg in sodium chloride 0.9 % 100 mL IVPB  250 mg Intravenous BID John Giovanni, MD 410 mL/hr at 08/14/18 0902 250 mg at 08/14/18 0902  . MEDLINE mouth rinse  15 mL Mouth Rinse BID Kathlen Mody, MD   15 mL at 08/13/18 2222  . QUEtiapine (SEROQUEL) tablet 25 mg  25 mg Oral QHS Kathlen Mody, MD      . RESOURCE THICKENUP CLEAR   Oral PRN Kathlen Mody, MD      . umeclidinium bromide (INCRUSE ELLIPTA) 62.5 MCG/INH 1 puff  1 puff Inhalation Daily Kathlen Mody, MD   1 puff at 08/14/18 1006     Discharge Medications: Please see discharge summary for a list of discharge medications.  Relevant Imaging Results:  Relevant Lab Results:   Additional Information SS#[id number] [place], [place]

## 2018-08-15 NOTE — Discharge Summary (Signed)
Physician Discharge Summary  Stacey Baker ZOX:[id number] DOB: 1932/09/30 DOA: 08/12/2018  PCP: Merlene Pulling, PA-C  Admit date: 08/12/2018 Discharge date: 08/15/2018  Admitted From: SNF Disposition:  SNF  Recommendations for Outpatient Follow-up:  1. Follow up with PCP in 1-2 weeks 2. Please obtain BMP/CBC in one week  Discharge Condition:Stable.  CODE STATUS: DNR Diet recommendation: Heart Healthy, dysphagia 1 diet with nectar thick fluids  Brief/Interim Summary: Stacey Baker a 83 y.o.femalewith medical history significant ofstroke with right-sided weakness and aphasia, dementia, seizure (on renal dose of Keppra), CKD 3, hyperlipidemia, hypertension, depression, Parkinson's disease, sinus bradycardia presenting to the hospital for further evaluation of right facial droop and slurred speech.   Discharge Diagnoses:  Principal Problem:   Acute metabolic encephalopathy Active Problems:   Parkinson disease (HCC)   HTN (hypertension)   Bradycardia   CKD (chronic kidney disease), stage III (HCC)   Thrombocytopenia (HCC)   Anemia, chronic renal failure, stage 3 (moderate) (HCC)   Seizure (HCC)  ACUTE METABOLIC ENCEPHALOPATHY: Appears to have resolved. She is alert and able to answer simple questions. PT eval recommending SNF.  Social work to assist in placement.    Parkinson's disease Resume Sinemet   Hypertension Well controlled resume home meds.   Acute on stage III CKD Much improvement with hydration.   Anemia of chronic disease hemoglobin stable between 9-10.   Seizures Neurology increased Keppra to 250 mg twice daily.   Chronic thrombocytopenia Platelets stable at 115.  No overt bleeding.   Discharge Instructions  Discharge Instructions    Diet - low sodium heart healthy   Complete by:  As directed    Discharge instructions   Complete by:  As directed    PLEASE FOLLOW UP WITH HOSPICE AS RECOMMENDED.     Allergies as of 08/15/2018       Reactions   Penicillins Other (See Comments)   "Allergic," per [facility] Has patient had a PCN reaction causing immediate rash, facial/tongue/throat swelling, SOB or lightheadedness with hypotension: Unk Has patient had a PCN reaction causing severe rash involving mucus membranes or skin necrosis: Unk Has patient had a PCN reaction that required hospitalization: Unk Has patient had a PCN reaction occurring within the last 10 years: Unk If all of the above answers are "NO", then may proceed with Cephalosporin use.   Plavix [clopidogrel Bisulfate] Other (See Comments)   "Allergic," per [facility]      Medication List    TAKE these medications   acetaminophen 325 MG tablet Commonly known as:  TYLENOL Take 650 mg by mouth every 4 (four) hours as needed.   amLODipine 10 MG tablet Commonly known as:  NORVASC Take 10 mg by mouth daily.   aspirin EC 325 MG tablet Take 325 mg by mouth daily.   BAZA ANTIFUNGAL 2 % cream Generic drug:  miconazole Apply 1 application topically 2 (two) times daily as needed. To sacrum every shift for redness after each toilet   BREO ELLIPTA 100-25 MCG/INH Aepb Generic drug:  fluticasone furoate-vilanterol Inhale 1 puff into the lungs daily.   calcitRIOL 0.25 MCG capsule Commonly known as:  ROCALTROL Take 0.25 mcg by mouth daily.   carbidopa-levodopa 25-100 MG tablet Commonly known as:  SINEMET IR Take 1 tablet by mouth 3 (three) times daily.   donepezil 10 MG tablet Commonly known as:  ARICEPT Take 10 mg by mouth at bedtime.   EQ SALINE SOLUTION/SENSITIVE Soln Apply 1 application topically every other day. Apply to neck topically once every  other day for wound until healed.   escitalopram 10 MG tablet Commonly known as:  LEXAPRO Take 10 mg by mouth daily.   ferrous sulfate 325 (65 FE) MG tablet Take 325 mg by mouth daily.   INCRUSE ELLIPTA 62.5 MCG/INH Aepb Generic drug:  umeclidinium bromide Inhale 1 puff into the lungs daily.   levETIRAcetam 250  MG tablet Commonly known as:  KEPPRA Take 1 tablet (250 mg total) by mouth 2 (two) times daily. What changed:  when to take this   loperamide 2 MG capsule Commonly known as:  IMODIUM Take by mouth as needed for diarrhea or loose stools.   LORazepam 0.5 MG tablet Commonly known as:  ATIVAN Take 0.5 mg by mouth every 4 (four) hours as needed for anxiety.   MIRALAX PO Take 17 g by mouth as needed (Constipation).   morphine 20 MG/ML concentrated solution Commonly known as:  ROXANOL Take 0.25 mg by mouth every hour as needed for severe pain.   PRESERVISION AREDS 2 PO Take 2 capsules by mouth daily.   PROBIOTIC DAILY Caps Take 1 capsule by mouth daily.   QUEtiapine 25 MG tablet Commonly known as:  SEROQUEL Take 25 mg by mouth at bedtime.   RESOURCE THICKENUP CLEAR Powd WITH EVERY MEAL      Follow-up Information    Merlene Pulling, PA-C. Schedule an appointment as soon as possible for a visit in 1 week(s).   Specialty:  Physician Assistant Contact information: [address] [place] [address] [phone]          Allergies  Allergen Reactions  . Penicillins Other (See Comments)    "Allergic," per [facility] Has patient had a PCN reaction causing immediate rash, facial/tongue/throat swelling, SOB or lightheadedness with hypotension: Unk Has patient had a PCN reaction causing severe rash involving mucus membranes or skin necrosis: Unk Has patient had a PCN reaction that required hospitalization: Unk Has patient had a PCN reaction occurring within the last 10 years: Unk If all of the above answers are "NO", then may proceed with Cephalosporin use.   Marland Kitchen Plavix [Clopidogrel Bisulfate] Other (See Comments)    "Allergic," per [facility]    Consultations:  Neurology   Procedures/Studies: Dg Chest 2 View  Result Date: 08/12/2018 CLINICAL DATA:  84 y/o  F; mental status change. EXAM: CHEST - 2 VIEW COMPARISON:  12/08/2017 chest radiograph. FINDINGS:  Stable cardiomegaly given projection and technique. Stable small bilateral pleural effusions. Mild reticular opacities, probably interstitial edema. No consolidation or pneumothorax. Stable chronic moderate L1 compression deformity. No acute osseous abnormality identified. IMPRESSION: Stable cardiomegaly and small bilateral effusions. Mild interstitial edema. Electronically Signed   By: Mitzi Hansen M.D.   On: 08/12/2018 22:18   Mr Maxine Glenn Head Wo Contrast  Result Date: 08/13/2018 CLINICAL DATA:  Gait imbalance, RIGHT-sided weakness and facial droop. Last seen normal at 5:30 p.m. Follow up code stroke. History of stroke, dementia, seizure, hypertension, Parkinson's disease. EXAM: MRI HEAD WITHOUT CONTRAST MRA HEAD WITHOUT CONTRAST MRA NECK WITHOUT CONTRAST TECHNIQUE: Multiplanar, multiecho pulse sequences of the brain and surrounding structures were obtained without intravenous contrast. Angiographic images of the Circle of Willis were obtained using MRA technique without intravenous contrast. Angiographic images of the neck were obtained using MRA technique without intravenous contrast. Carotid stenosis measurements (when applicable) are obtained utilizing NASCET criteria, using the distal internal carotid diameter as the denominator. COMPARISON:  CT HEAD August 12, 2018 and MRI head Mar 02, 2017 FINDINGS: MRI HEAD  FINDINGS-moderately motion degraded examination. INTRACRANIAL CONTENTS: No reduced diffusion to suggest acute ischemia. No susceptibility artifact to suggest hemorrhage. Moderate to severe parenchymal brain volume loss. No hydrocephalus. Patchy supratentorial white matter FLAIR T2 hyperintensities compatible with moderate chronic small vessel ischemic changes. No suspicious parenchymal signal, masses, mass effect. No abnormal extra-axial fluid collections. No extra-axial masses. VASCULAR: Normal major intracranial vascular flow voids present at skull base. SKULL AND UPPER CERVICAL SPINE: No  abnormal sellar expansion. No suspicious calvarial bone marrow signal. Craniocervical junction maintained. SINUSES/ORBITS: Trace bilateral mastoid effusions. Paranasal sinuses are well aerated. Included ocular globes and orbital contents are non-suspicious. Status post bilateral ocular lens implants. OTHER: None. MRA HEAD FINDINGS-moderately motion degraded examination. ANTERIOR CIRCULATION: Normal flow related enhancement of the included cervical, petrous, cavernous and supraclinoid internal carotid arteries. Patent anterior communicating artery. Patent anterior and middle cerebral arteries. Severe stenosis versus motion artifact RIGHT M2. No large vessel occlusion, flow limiting stenosis, aneurysm. POSTERIOR CIRCULATION: Codominant vertebral arteries. Vertebrobasilar arteries are patent, with normal flow related enhancement of the main branch vessels. Patent posterior cerebral arteries. Robust LEFT posterior communicating artery present. No large vessel occlusion, flow limiting stenosis,  aneurysm. ANATOMIC VARIANTS: None. Source images and MIP images were reviewed. MRA NECK FINDINGS-limited assessment of arch vessel origins by noncontrast MRA. ANTERIOR CIRCULATION: The common carotid arteries are widely patent bilaterally. The carotid bifurcations are patent bilaterally without hemodynamically significant stenosis by NASCET criteria. No flow limiting stenosis or luminal irregularity. POSTERIOR CIRCULATION: Bilateral vertebral arteries are patent to the vertebrobasilar junction. No flow limiting stenosis or luminal irregularity. Source images and MIP images were reviewed. IMPRESSION: MRI HEAD: 1. Moderately motion degraded examination. No acute intracranial process. 2. Stable parenchymal brain volume loss and moderate chronic small vessel ischemic changes. MRA HEAD: 1. Moderately motion degraded examination. No emergent large vessel occlusion. 2. Severe stenoses RIGHT M2 versus motion artifact. MRA NECK: 1.  Negative noncontrast MRA neck. Electronically Signed   By: Awilda Metro M.D.   On: 08/13/2018 02:01   Mr Maxine Glenn Neck Wo Contrast  Result Date: 08/13/2018 CLINICAL DATA:  Gait imbalance, RIGHT-sided weakness and facial droop. Last seen normal at 5:30 p.m. Follow up code stroke. History of stroke, dementia, seizure, hypertension, Parkinson's disease. EXAM: MRI HEAD WITHOUT CONTRAST MRA HEAD WITHOUT CONTRAST MRA NECK WITHOUT CONTRAST TECHNIQUE: Multiplanar, multiecho pulse sequences of the brain and surrounding structures were obtained without intravenous contrast. Angiographic images of the Circle of Willis were obtained using MRA technique without intravenous contrast. Angiographic images of the neck were obtained using MRA technique without intravenous contrast. Carotid stenosis measurements (when applicable) are obtained utilizing NASCET criteria, using the distal internal carotid diameter as the denominator. COMPARISON:  CT HEAD August 12, 2018 and MRI head Mar 02, 2017 FINDINGS: MRI HEAD FINDINGS-moderately motion degraded examination. INTRACRANIAL CONTENTS: No reduced diffusion to suggest acute ischemia. No susceptibility artifact to suggest hemorrhage. Moderate to severe parenchymal brain volume loss. No hydrocephalus. Patchy supratentorial white matter FLAIR T2 hyperintensities compatible with moderate chronic small vessel ischemic changes. No suspicious parenchymal signal, masses, mass effect. No abnormal extra-axial fluid collections. No extra-axial masses. VASCULAR: Normal major intracranial vascular flow voids present at skull base. SKULL AND UPPER CERVICAL SPINE: No abnormal sellar expansion. No suspicious calvarial bone marrow signal. Craniocervical junction maintained. SINUSES/ORBITS: Trace bilateral mastoid effusions. Paranasal sinuses are well aerated. Included ocular globes and orbital contents are non-suspicious. Status post bilateral ocular lens implants. OTHER: None. MRA HEAD  FINDINGS-moderately motion degraded examination. ANTERIOR CIRCULATION: Normal flow related enhancement  of the included cervical, petrous, cavernous and supraclinoid internal carotid arteries. Patent anterior communicating artery. Patent anterior and middle cerebral arteries. Severe stenosis versus motion artifact RIGHT M2. No large vessel occlusion, flow limiting stenosis, aneurysm. POSTERIOR CIRCULATION: Codominant vertebral arteries. Vertebrobasilar arteries are patent, with normal flow related enhancement of the main branch vessels. Patent posterior cerebral arteries. Robust LEFT posterior communicating artery present. No large vessel occlusion, flow limiting stenosis,  aneurysm. ANATOMIC VARIANTS: None. Source images and MIP images were reviewed. MRA NECK FINDINGS-limited assessment of arch vessel origins by noncontrast MRA. ANTERIOR CIRCULATION: The common carotid arteries are widely patent bilaterally. The carotid bifurcations are patent bilaterally without hemodynamically significant stenosis by NASCET criteria. No flow limiting stenosis or luminal irregularity. POSTERIOR CIRCULATION: Bilateral vertebral arteries are patent to the vertebrobasilar junction. No flow limiting stenosis or luminal irregularity. Source images and MIP images were reviewed. IMPRESSION: MRI HEAD: 1. Moderately motion degraded examination. No acute intracranial process. 2. Stable parenchymal brain volume loss and moderate chronic small vessel ischemic changes. MRA HEAD: 1. Moderately motion degraded examination. No emergent large vessel occlusion. 2. Severe stenoses RIGHT M2 versus motion artifact. MRA NECK: 1. Negative noncontrast MRA neck. Electronically Signed   By: Awilda Metro M.D.   On: 08/13/2018 02:01   Mr Brain Wo Contrast  Result Date: 08/13/2018 CLINICAL DATA:  Gait imbalance, RIGHT-sided weakness and facial droop. Last seen normal at 5:30 p.m. Follow up code stroke. History of stroke, dementia, seizure,  hypertension, Parkinson's disease. EXAM: MRI HEAD WITHOUT CONTRAST MRA HEAD WITHOUT CONTRAST MRA NECK WITHOUT CONTRAST TECHNIQUE: Multiplanar, multiecho pulse sequences of the brain and surrounding structures were obtained without intravenous contrast. Angiographic images of the Circle of Willis were obtained using MRA technique without intravenous contrast. Angiographic images of the neck were obtained using MRA technique without intravenous contrast. Carotid stenosis measurements (when applicable) are obtained utilizing NASCET criteria, using the distal internal carotid diameter as the denominator. COMPARISON:  CT HEAD August 12, 2018 and MRI head Mar 02, 2017 FINDINGS: MRI HEAD FINDINGS-moderately motion degraded examination. INTRACRANIAL CONTENTS: No reduced diffusion to suggest acute ischemia. No susceptibility artifact to suggest hemorrhage. Moderate to severe parenchymal brain volume loss. No hydrocephalus. Patchy supratentorial white matter FLAIR T2 hyperintensities compatible with moderate chronic small vessel ischemic changes. No suspicious parenchymal signal, masses, mass effect. No abnormal extra-axial fluid collections. No extra-axial masses. VASCULAR: Normal major intracranial vascular flow voids present at skull base. SKULL AND UPPER CERVICAL SPINE: No abnormal sellar expansion. No suspicious calvarial bone marrow signal. Craniocervical junction maintained. SINUSES/ORBITS: Trace bilateral mastoid effusions. Paranasal sinuses are well aerated. Included ocular globes and orbital contents are non-suspicious. Status post bilateral ocular lens implants. OTHER: None. MRA HEAD FINDINGS-moderately motion degraded examination. ANTERIOR CIRCULATION: Normal flow related enhancement of the included cervical, petrous, cavernous and supraclinoid internal carotid arteries. Patent anterior communicating artery. Patent anterior and middle cerebral arteries. Severe stenosis versus motion artifact RIGHT M2. No large  vessel occlusion, flow limiting stenosis, aneurysm. POSTERIOR CIRCULATION: Codominant vertebral arteries. Vertebrobasilar arteries are patent, with normal flow related enhancement of the main branch vessels. Patent posterior cerebral arteries. Robust LEFT posterior communicating artery present. No large vessel occlusion, flow limiting stenosis,  aneurysm. ANATOMIC VARIANTS: None. Source images and MIP images were reviewed. MRA NECK FINDINGS-limited assessment of arch vessel origins by noncontrast MRA. ANTERIOR CIRCULATION: The common carotid arteries are widely patent bilaterally. The carotid bifurcations are patent bilaterally without hemodynamically significant stenosis by NASCET criteria. No flow limiting stenosis or luminal irregularity. POSTERIOR CIRCULATION:  Bilateral vertebral arteries are patent to the vertebrobasilar junction. No flow limiting stenosis or luminal irregularity. Source images and MIP images were reviewed. IMPRESSION: MRI HEAD: 1. Moderately motion degraded examination. No acute intracranial process. 2. Stable parenchymal brain volume loss and moderate chronic small vessel ischemic changes. MRA HEAD: 1. Moderately motion degraded examination. No emergent large vessel occlusion. 2. Severe stenoses RIGHT M2 versus motion artifact. MRA NECK: 1. Negative noncontrast MRA neck. Electronically Signed   By: Awilda Metro M.D.   On: 08/13/2018 02:01   Ct Head Code Stroke Wo Contrast`  Result Date: 08/12/2018 CLINICAL DATA:  Code stroke. 84 y/o F; right-sided weakness, slurred speech, facial droop. EXAM: CT HEAD WITHOUT CONTRAST TECHNIQUE: Contiguous axial images were obtained from the base of the skull through the vertex without intravenous contrast. COMPARISON:  12/08/2017 CT head. FINDINGS: Brain: No evidence of acute infarction, hemorrhage, hydrocephalus, extra-axial collection or mass lesion/mass effect. Stable chronic microvascular ischemic changes and volume loss of the brain. Vascular:  No hyperdense vessel or unexpected calcification. Skull: Normal. Negative for fracture or focal lesion. Sinuses/Orbits: Visible paranasal sinuses and the right mastoid air cells are normally aerated. Minimal opacification of left mastoid air cells. High-riding left jugular bulb with thin sigmoid plate. Bilateral intra-ocular lens replacement. Other: None. ASPECTS [facility] Stroke Program Early CT Score) - Ganglionic level infarction (caudate, lentiform nuclei, internal capsule, insula, M1-M3 cortex): 7 - Supraganglionic infarction (M4-M6 cortex): 3 Total score (0-10 with 10 being normal): 10 IMPRESSION: 1. No acute intracranial abnormality identified. 2. Stable chronic microvascular ischemic changes and volume loss of the brain. 3. ASPECTS is 10. These results were communicated to Dr. Wilford Corner at 9:01 pmon 10/24/2019by text page via the [facility] messaging system. Electronically Signed   By: Mitzi Hansen M.D.   On: 08/12/2018 21:02      Subjective:  She denies any complaints appears comfortable. Discharge Exam: Vitals:   08/15/18 1135 08/15/18 1626  BP: (!) 161/56 (!) 153/75  Pulse: (!) 47 (!) 49  Resp: 15 15  Temp: 97.7 F (36.5 C) 97.7 F (36.5 C)  SpO2: 96% 97%   Vitals:   08/15/18 0358 08/15/18 0742 08/15/18 1135 08/15/18 1626  BP: (!) 147/61 (!) 155/71 (!) 161/56 (!) 153/75  Pulse: (!) 47 (!) 47 (!) 47 (!) 49  Resp: 16 16 15 15   Temp: 98.5 F (36.9 C) (!) 97.4 F (36.3 C) 97.7 F (36.5 C) 97.7 F (36.5 C)  TempSrc: Oral Oral Oral   SpO2: 97% 97% 96% 97%  Weight:        General: Pt is alert, awake, not in acute distress Cardiovascular: RRR, S1/S2 +, no rubs, no gallops Respiratory: CTA bilaterally, no wheezing, no rhonchi Abdominal: Soft, NT, ND, bowel sounds + Extremities: no edema, no cyanosis    The results of significant diagnostics from this hospitalization (including imaging, microbiology, ancillary and laboratory) are listed below for reference.      Microbiology: Recent Results (from the past 240 hour(s))  MRSA PCR Screening     Status: None   Collection Time: 08/13/18  3:14 AM  Result Value Ref Range Status   MRSA by PCR NEGATIVE NEGATIVE Final    Comment:        The GeneXpert MRSA Assay (FDA approved for NASAL specimens only), is one component of a comprehensive MRSA colonization surveillance program. It is not intended to diagnose MRSA infection nor to guide or monitor treatment for MRSA infections. Performed at [facility], [address],  [place], [place] [address]      Labs: BNP (last 3 results) No results for input(s): BNP in the last 8760 hours. Basic Metabolic Panel: Recent Labs  Lab 08/12/18 2043 08/12/18 2056 08/13/18 0420 08/14/18 0932  NA 142 141 142 141  K 4.5 4.6 3.9 4.4  CL 108 107 108 112*  CO2 24  --  29 19*  GLUCOSE 114* 114* 99 103*  BUN 32* 34* 29* 23  CREATININE 2.03* 2.20* 1.82* 1.53*  CALCIUM 9.1  --  8.7* 8.5*   Liver Function Tests: Recent Labs  Lab 08/12/18 2043 08/13/18 0420  AST 11* 10*  ALT <5 <5  ALKPHOS 71 63  BILITOT 0.5 0.6  PROT 6.5 5.5*  ALBUMIN 3.6 3.0*   No results for input(s): LIPASE, AMYLASE in the last 168 hours. Recent Labs  Lab 08/13/18 0420  AMMONIA 9   CBC: Recent Labs  Lab 08/12/18 2043 08/12/18 2056 08/13/18 0420  WBC 4.7  --  4.1  NEUTROABS 2.5  --   --   HGB 10.5* 10.5* 9.8*  HCT 33.8* 31.0* 31.7*  MCV 94.4  --  93.8  PLT 126*  --  115*   Cardiac Enzymes: No results for input(s): CKTOTAL, CKMB, CKMBINDEX, TROPONINI in the last 168 hours. BNP: Invalid input(s): POCBNP CBG: No results for input(s): GLUCAP in the last 168 hours. D-Dimer No results for input(s): DDIMER in the last 72 hours. Hgb A1c No results for input(s): HGBA1C in the last 72 hours. Lipid Profile No results for input(s): CHOL, HDL, LDLCALC, TRIG, CHOLHDL, LDLDIRECT in the last 72 hours. Thyroid function studies Recent Labs    08/13/18 0420  TSH 2.496    Anemia work up Recent Labs    08/13/18 0420  VITAMINB12 819   Urinalysis    Component Value Date/Time   COLORURINE YELLOW 08/12/2018 2305   APPEARANCEUR CLEAR 08/12/2018 2305   APPEARANCEUR Cloudy 01/12/2015 2035   LABSPEC 1.013 08/12/2018 2305   LABSPEC 1.018 01/12/2015 2035   PHURINE 6.0 08/12/2018 2305   GLUCOSEU NEGATIVE 08/12/2018 2305   GLUCOSEU Negative 01/12/2015 2035   HGBUR NEGATIVE 08/12/2018 2305   BILIRUBINUR NEGATIVE 08/12/2018 2305   BILIRUBINUR Negative 01/12/2015 2035   KETONESUR NEGATIVE 08/12/2018 2305   PROTEINUR 30 (A) 08/12/2018 2305   NITRITE NEGATIVE 08/12/2018 2305   LEUKOCYTESUR NEGATIVE 08/12/2018 2305   LEUKOCYTESUR 3+ 01/12/2015 2035   Sepsis Labs Invalid input(s): PROCALCITONIN,  WBC,  LACTICIDVEN Microbiology Recent Results (from the past 240 hour(s))  MRSA PCR Screening     Status: None   Collection Time: 08/13/18  3:14 AM  Result Value Ref Range Status   MRSA by PCR NEGATIVE NEGATIVE Final    Comment:        The GeneXpert MRSA Assay (FDA approved for NASAL specimens only), is one component of a comprehensive MRSA colonization surveillance program. It is not intended to diagnose MRSA infection nor to guide or monitor treatment for MRSA infections. Performed at [facility], [address] [place], [place] [address]      Time coordinating discharge: 32 minutes  SIGNED:   Kathlen Mody, MD  [facility] 08/15/2018, 7:25 PM Pager   If 7PM-7AM, please contact night-coverage [email or web address] Password [facility]   10/27 no change in medications. Currently awaiting bed at SNF.  Discontinued telemetry.  As per the RN pt is eating well.    Kathlen Mody, MD  [phone]

## 2018-08-16 ENCOUNTER — Inpatient Hospital Stay
Admission: EM | Admit: 2018-08-16 | Discharge: 2018-08-23 | DRG: 100 | Disposition: A | Discharge status: Hospice | Attending: Internal Medicine | Admitting: Internal Medicine

## 2018-08-16 ENCOUNTER — Emergency Department

## 2018-08-16 ENCOUNTER — Other Ambulatory Visit: Payer: Self-pay

## 2018-08-16 DIAGNOSIS — E870 Hyperosmolality and hypernatremia: Secondary | ICD-10-CM | POA: Diagnosis present

## 2018-08-16 DIAGNOSIS — R4182 Altered mental status, unspecified: Secondary | ICD-10-CM

## 2018-08-16 DIAGNOSIS — Z8673 Personal history of transient ischemic attack (TIA), and cerebral infarction without residual deficits: Secondary | ICD-10-CM | POA: Diagnosis not present

## 2018-08-16 DIAGNOSIS — G20 Parkinson's disease: Secondary | ICD-10-CM | POA: Diagnosis present

## 2018-08-16 DIAGNOSIS — G40909 Epilepsy, unspecified, not intractable, without status epilepticus: Principal | ICD-10-CM | POA: Diagnosis present

## 2018-08-16 DIAGNOSIS — Z888 Allergy status to other drugs, medicaments and biological substances status: Secondary | ICD-10-CM | POA: Diagnosis not present

## 2018-08-16 DIAGNOSIS — F329 Major depressive disorder, single episode, unspecified: Secondary | ICD-10-CM | POA: Diagnosis present

## 2018-08-16 DIAGNOSIS — Z8249 Family history of ischemic heart disease and other diseases of the circulatory system: Secondary | ICD-10-CM | POA: Diagnosis not present

## 2018-08-16 DIAGNOSIS — N3001 Acute cystitis with hematuria: Secondary | ICD-10-CM | POA: Diagnosis present

## 2018-08-16 DIAGNOSIS — R569 Unspecified convulsions: Secondary | ICD-10-CM | POA: Diagnosis present

## 2018-08-16 DIAGNOSIS — F0280 Dementia in other diseases classified elsewhere without behavioral disturbance: Secondary | ICD-10-CM | POA: Diagnosis present

## 2018-08-16 DIAGNOSIS — Z880 Allergy status to penicillin: Secondary | ICD-10-CM

## 2018-08-16 DIAGNOSIS — Z993 Dependence on wheelchair: Secondary | ICD-10-CM | POA: Diagnosis not present

## 2018-08-16 DIAGNOSIS — G9341 Metabolic encephalopathy: Secondary | ICD-10-CM | POA: Diagnosis present

## 2018-08-16 DIAGNOSIS — Z79899 Other long term (current) drug therapy: Secondary | ICD-10-CM | POA: Diagnosis not present

## 2018-08-16 DIAGNOSIS — Z7982 Long term (current) use of aspirin: Secondary | ICD-10-CM | POA: Diagnosis not present

## 2018-08-16 DIAGNOSIS — N390 Urinary tract infection, site not specified: Secondary | ICD-10-CM

## 2018-08-16 DIAGNOSIS — I129 Hypertensive chronic kidney disease with stage 1 through stage 4 chronic kidney disease, or unspecified chronic kidney disease: Secondary | ICD-10-CM | POA: Diagnosis present

## 2018-08-16 DIAGNOSIS — N183 Chronic kidney disease, stage 3 (moderate): Secondary | ICD-10-CM | POA: Diagnosis present

## 2018-08-16 DIAGNOSIS — N179 Acute kidney failure, unspecified: Secondary | ICD-10-CM | POA: Diagnosis present

## 2018-08-16 DIAGNOSIS — B961 Klebsiella pneumoniae [K. pneumoniae] as the cause of diseases classified elsewhere: Secondary | ICD-10-CM | POA: Diagnosis present

## 2018-08-16 DIAGNOSIS — E785 Hyperlipidemia, unspecified: Secondary | ICD-10-CM | POA: Diagnosis present

## 2018-08-16 DIAGNOSIS — E860 Dehydration: Secondary | ICD-10-CM | POA: Diagnosis present

## 2018-08-16 DIAGNOSIS — Z515 Encounter for palliative care: Secondary | ICD-10-CM | POA: Diagnosis present

## 2018-08-16 DIAGNOSIS — Z66 Do not resuscitate: Secondary | ICD-10-CM | POA: Diagnosis present

## 2018-08-16 LAB — CBC WITH DIFFERENTIAL/PLATELET
ABS IMMATURE GRANULOCYTES: 0.01 10*3/uL (ref 0.00–0.07)
BASOS PCT: 0 %
Basophils Absolute: 0 10*3/uL (ref 0.0–0.1)
Eosinophils Absolute: 0.1 10*3/uL (ref 0.0–0.5)
Eosinophils Relative: 2 %
HCT: 29.5 % — ABNORMAL LOW (ref 36.0–46.0)
Hemoglobin: 9.3 g/dL — ABNORMAL LOW (ref 12.0–15.0)
IMMATURE GRANULOCYTES: 0 %
Lymphocytes Relative: 19 %
Lymphs Abs: 1 10*3/uL (ref 0.7–4.0)
MCH: 30.3 pg (ref 26.0–34.0)
MCHC: 31.5 g/dL (ref 30.0–36.0)
MCV: 96.1 fL (ref 80.0–100.0)
Monocytes Absolute: 0.5 10*3/uL (ref 0.1–1.0)
Monocytes Relative: 10 %
NEUTROS ABS: 3.6 10*3/uL (ref 1.7–7.7)
NEUTROS PCT: 69 %
PLATELETS: 95 10*3/uL — AB (ref 150–400)
RBC: 3.07 MIL/uL — ABNORMAL LOW (ref 3.87–5.11)
RDW: 13.8 % (ref 11.5–15.5)
WBC: 5.2 10*3/uL (ref 4.0–10.5)
nRBC: 0 % (ref 0.0–0.2)

## 2018-08-16 LAB — URINALYSIS, COMPLETE (UACMP) WITH MICROSCOPIC
BILIRUBIN URINE: NEGATIVE
GLUCOSE, UA: NEGATIVE mg/dL
Ketones, ur: 5 mg/dL — AB
NITRITE: NEGATIVE
PH: 5 (ref 5.0–8.0)
PROTEIN: 100 mg/dL — AB
SQUAMOUS EPITHELIAL / LPF: NONE SEEN (ref 0–5)
Specific Gravity, Urine: 1.02 (ref 1.005–1.030)

## 2018-08-16 LAB — COMPREHENSIVE METABOLIC PANEL
ALBUMIN: 3.3 g/dL — AB (ref 3.5–5.0)
AST: 18 U/L (ref 15–41)
Alkaline Phosphatase: 99 U/L (ref 38–126)
Anion gap: 9 (ref 5–15)
BUN: 30 mg/dL — AB (ref 8–23)
CO2: 24 mmol/L (ref 22–32)
CREATININE: 1.89 mg/dL — AB (ref 0.44–1.00)
Calcium: 8.2 mg/dL — ABNORMAL LOW (ref 8.9–10.3)
Chloride: 109 mmol/L (ref 98–111)
GFR calc Af Amer: 27 mL/min — ABNORMAL LOW (ref >60)
GFR, EST NON AFRICAN AMERICAN: 23 mL/min — AB (ref >60)
GLUCOSE: 124 mg/dL — AB (ref 70–99)
Potassium: 4.4 mmol/L (ref 3.5–5.1)
Sodium: 142 mmol/L (ref 135–145)
Total Bilirubin: 0.5 mg/dL (ref 0.3–1.2)
Total Protein: 6.3 g/dL — ABNORMAL LOW (ref 6.5–8.1)

## 2018-08-16 LAB — TROPONIN I: Troponin I: <0.03 ng/mL (ref <0.03)

## 2018-08-16 LAB — LACTIC ACID, PLASMA: LACTIC ACID, VENOUS: 1 mmol/L (ref 0.5–1.9)

## 2018-08-16 LAB — GLUCOSE, CAPILLARY: Glucose-Capillary: 106 mg/dL — ABNORMAL HIGH (ref 70–99)

## 2018-08-16 LAB — AMMONIA: AMMONIA: 26 umol/L (ref 9–35)

## 2018-08-16 MED ORDER — SODIUM CHLORIDE 0.9 % IV BOLUS
1000.0000 mL | Freq: Once | INTRAVENOUS | Status: AC
  Administered 2018-08-16: 1000 mL via INTRAVENOUS

## 2018-08-16 MED ORDER — SODIUM CHLORIDE 0.9 % IV SOLN
1.0000 g | Freq: Once | INTRAVENOUS | Status: AC
  Administered 2018-08-16: 1 g via INTRAVENOUS
  Filled 2018-08-16: qty 10

## 2018-08-16 NOTE — ED Provider Notes (Signed)
[facility] Emergency Department Provider Note  ____________________________________________   First MD Initiated Contact with Patient 08/16/18 2029     (approximate)  I have reviewed the triage vital signs and the nursing notes.   HISTORY  Chief Complaint Seizures   HPI Stacey Baker is a 84 y.o. female who presents to the emergency department for evaluation after experiencing what the nursing home appeared to be a seizure.  Patient was sent to [facility] 4 days ago after experiencing strokelike symptoms.  She was sent back to the nursing home today and had an episode where she had some sort of tremoring episode and then began foaming at the mouth.  Since that time, she has had altered mental status where she does not answer any questions and is obtunded.  EMS reports that the last blood pressure was 93/40 glucose was in the 140s.  Past Medical History:  Diagnosis Date  . Apraxia following cerebral infarction   . CKD (chronic kidney disease), stage III (HCC)    a. With acute worsening/AKI noted on several admissions.  . Dementia (HCC)   . Dementia without behavioral disturbance (HCC)   . Depression   . Encephalopathy   . Hyperkalemia    a. 08/2015  . Hyperlipidemia   . Hyperlipidemia   . Hypertension   . Iron deficiency anemia   . Major depressive disorder   . Muscle weakness   . Parkinson disease (HCC)   . Parkinson's disease (HCC)   . Pericardial effusion    a. 06/2016 Echo: Ef 60-65%, no rwma, Gr1 DD, mild MR, mildly dil LA, PASP , mod circumferential pericardial effusion - no hemodynamic compromise.  . Sinus bradycardia    a. 08/2015 in setting of beta blocker and hyperkalemia.  . Stroke (HCC)   . TIA (transient ischemic attack)    a. 08/2015 - essentially neg w/u for stroke.  Marland Kitchen UTI (urinary tract infection)     Patient Active Problem List   Diagnosis Date Noted  . Acute UTI 08/16/2018  . Acute metabolic encephalopathy 08/13/2018    . Seizure (HCC) 08/12/2018  . Expressive aphasia   . Palliative care encounter   . Goals of care, counseling/discussion   . Acute on chronic renal failure (HCC) 02/26/2017  . Anemia, chronic renal failure, stage 3 (moderate) (HCC) 02/26/2017  . Guaiac positive stools 02/26/2017  . Fall 02/26/2017  . Hypoxia   . Atrial tachycardia (HCC)   . Generalized weakness 07/05/2016  . Thrombocytopenia (HCC) 07/05/2016  . UTI (urinary tract infection) 07/05/2016  . Acute respiratory failure (HCC) 07/05/2016  . Pleural effusion 07/05/2016  . CKD (chronic kidney disease), stage III (HCC)   . Pericardial effusion   . Sinus bradycardia   . Acute renal failure (ARF) (HCC) 12/31/2015  . Chronic renal insufficiency 09/15/2015  . Bradycardia 09/15/2015  . Cellulitis 09/13/2015  . Parkinson disease (HCC) 09/13/2015  . HTN (hypertension) 09/13/2015  . HLD (hyperlipidemia) 09/13/2015  . Depression 09/13/2015  . Dementia (HCC) 09/13/2015  . Hyperkalemia 09/13/2015  . TIA (transient ischemic attack) 08/27/2015    Past Surgical History:  Procedure Laterality Date  . ABDOMINAL HYSTERECTOMY    . APPENDECTOMY    . CHOLECYSTECTOMY      Prior to Admission medications   Medication Sig Start Date End Date Taking? Authorizing Provider  acetaminophen (TYLENOL) 325 MG tablet Take 650 mg by mouth every 4 (four) hours as needed.    Yes [provider]  amLODipine (NORVASC) 10 MG  tablet Take 10 mg by mouth daily. 08/02/18  Yes [provider]  aspirin EC 325 MG tablet Take 325 mg by mouth daily.    Yes [provider]  BREO ELLIPTA 100-25 MCG/INH AEPB Inhale 1 puff into the lungs daily. 07/27/18  Yes [provider]  calcitRIOL (ROCALTROL) 0.25 MCG capsule Take 0.25 mcg by mouth daily.  06/08/17  Yes [provider]  carbidopa-levodopa (SINEMET IR) 25-100 MG tablet Take 1 tablet by mouth 3 (three) times daily.   Yes [provider]  donepezil (ARICEPT) 10  MG tablet Take 10 mg by mouth at bedtime.  12/22/16  Yes [provider]  escitalopram (LEXAPRO) 10 MG tablet Take 10 mg by mouth daily.   Yes [provider]  ferrous sulfate 325 (65 FE) MG tablet Take 325 mg by mouth 2 (two) times daily with a meal.    Yes [provider]  levETIRAcetam (KEPPRA) 250 MG tablet Take 1 tablet (250 mg total) by mouth 2 (two) times daily. 08/14/18  Yes Kathlen Mody, MD  loperamide (IMODIUM) 2 MG capsule Take by mouth as needed for diarrhea or loose stools.   Yes [provider]  LORazepam (ATIVAN) 0.5 MG tablet Take 0.5 mg by mouth every 4 (four) hours as needed for anxiety.   Yes [provider]  Maltodextrin-Xanthan Gum (RESOURCE THICKENUP CLEAR) POWD WITH EVERY MEAL 08/14/18  Yes Kathlen Mody, MD  morphine (ROXANOL) 20 MG/ML concentrated solution Take 0.25 mg by mouth every hour as needed for severe pain.    Yes [provider]  Multiple Vitamins-Minerals (PRESERVISION AREDS 2 PO) Take 2 capsules by mouth daily.   Yes [provider]  Polyethylene Glycol 3350 (MIRALAX PO) Take 17 g by mouth as needed (Constipation).    Yes [provider]  Probiotic Product (PROBIOTIC DAILY) CAPS Take 1 capsule by mouth 2 (two) times daily.    Yes [provider]  QUEtiapine (SEROQUEL) 25 MG tablet Take 25 mg by mouth at bedtime.   Yes [provider]  umeclidinium bromide (INCRUSE ELLIPTA) 62.5 MCG/INH AEPB Inhale 1 puff into the lungs daily. 02/04/18  Yes [provider]  vitamin B-12 (CYANOCOBALAMIN) 1000 MCG tablet Take 1,000 mcg by mouth daily.   Yes [provider]  miconazole (BAZA ANTIFUNGAL) 2 % cream Apply 1 application topically 2 (two) times daily as needed. To sacrum every shift for redness after each toilet    [provider]  Soft Lens Products (EQ SALINE SOLUTION/SENSITIVE) SOLN Apply 1 application topically every other day. Apply to neck topically once  every other day for wound until healed.    [provider]    Allergies Penicillins and Plavix [clopidogrel bisulfate]  Family History  Problem Relation Age of Onset  . Heart disease Mother   . Hypertension Other     Social History Social History   Tobacco Use  . Smoking status: Never Smoker  . Smokeless tobacco: Never Used  Substance Use Topics  . Alcohol use: No    Alcohol/week: 0.0 standard drinks  . Drug use: No    Review of Systems  5 caveat-altered mental status obtunded. ____________________________________________   PHYSICAL EXAM:  VITAL SIGNS: ED Triage Vitals [08/16/18 2043]  Enc Vitals Group     BP  143/60     Pulse 50     Resp 13     Temp      Temp src      SpO2 95 %  Weight      Height      Head Circumference      Peak Flow      Pain Score      Pain Loc      Pain Edu?      Excl. in GC?     Constitutional: Alert and oriented. Well appearing and in no acute distress. Eyes: Conjunctivae are normal. PERRL. EOMI. Head: Atraumatic. Nose: No congestion/rhinnorhea. Mouth/Throat: Mucous membranes are moist.  Oropharynx non-erythematous. Neck: No stridor.   Cardiovascular: Normal rate, regular rhythm. Grossly normal heart sounds.  Good peripheral circulation. Respiratory: Normal respiratory effort.  No retractions. Lungs CTAB. Gastrointestinal: Soft and nontender. No distention. No abdominal bruits. No CVA tenderness. Musculoskeletal: No lower extremity tenderness nor edema.  No joint effusions. Neurologic:  Normal speech and language. No gross focal neurologic deficits are appreciated. No gait instability. Skin:  Skin is warm, dry and intact. No rash noted. Psychiatric: Mood and affect are normal. Speech and behavior are normal.  ____________________________________________   LABS (all labs ordered are listed, but only abnormal results are displayed)  Labs Reviewed  COMPREHENSIVE METABOLIC PANEL - Abnormal; Notable for the  following components:      Result Value   Glucose, Bld 124 (*)    BUN 30 (*)    Creatinine, Ser 1.89 (*)    Calcium 8.2 (*)    Total Protein 6.3 (*)    Albumin 3.3 (*)    GFR calc non Af Amer 23 (*)    GFR calc Af Amer 27 (*)    All other components within normal limits  URINALYSIS, COMPLETE (UACMP) WITH MICROSCOPIC - Abnormal; Notable for the following components:   Color, Urine YELLOW (*)    APPearance TURBID (*)    Hgb urine dipstick SMALL (*)    Ketones, ur 5 (*)    Protein, ur 100 (*)    Leukocytes, UA LARGE (*)    WBC, UA >50 (*)    Bacteria, UA MANY (*)    All other components within normal limits  CBC WITH DIFFERENTIAL/PLATELET - Abnormal; Notable for the following components:   RBC 3.07 (*)    Hemoglobin 9.3 (*)    HCT 29.5 (*)    Platelets 95 (*)    All other components within normal limits  GASTROINTESTINAL PANEL BY PCR, STOOL (REPLACES STOOL CULTURE)  C DIFFICILE QUICK SCREEN W PCR REFLEX  AMMONIA  TROPONIN I  LACTIC ACID, PLASMA  CBC WITH DIFFERENTIAL/PLATELET  LACTIC ACID, PLASMA  CBG MONITORING, ED   ____________________________________________  EKG  See MD interpretation.  ____________________________________________  RADIOLOGY  ED MD interpretation:  Pending.  Official radiology report(s): Ct Head Wo Contrast  Result Date: 08/16/2018 CLINICAL DATA:  Altered mental status. EXAM: CT HEAD WITHOUT CONTRAST TECHNIQUE: Contiguous axial images were obtained from the base of the skull through the vertex without intravenous contrast. COMPARISON:  08/12/2018 and 12/08/2017 FINDINGS: Brain: Ventricles and cisterns as well as CSF spaces are mildly prominent and unchanged compatible with age related atrophy. There is mild chronic ischemic microvascular disease. There is no mass, mass effect, shift of midline structures or acute hemorrhage. No evidence of acute infarction. Vascular: No hyperdense vessel or unexpected calcification. Skull: Normal. Negative for  fracture or focal lesion. Sinuses/Orbits: No acute finding. Other: None. IMPRESSION: No acute findings. Minimal chronic ischemic microvascular disease and stable age related atrophic change. Electronically Signed   By: Elberta Fortis M.D.   On: 08/16/2018 20:50    ____________________________________________  PROCEDURES  Procedure(s) performed: None  Procedures  Critical Care performed: No  ____________________________________________   INITIAL IMPRESSION / ASSESSMENT AND PLAN / ED COURSE  As part of my medical decision making, I reviewed the following data within the electronic MEDICAL RECORD NUMBER Notes from prior ED visits.  84 year old female presenting to the emergency department for treatment and evaluation after an episode that appeared to the nursing home staff was a seizure.  Upon my exam, the patient has obtunded and not answering questions.  She does respond to stimuli such as pain and exam of her pupils.  Plan will be to do CT of the head as well as work-up for seizure and sepsis.  There is some vague report of similar activity one other time when she had an infection.  No further details are available about that incident.  Patient to be admitted for urinary tract infection, seizure-like activity, and altered mental status.  She is somewhat more awake and mumbles answers to a few questions that seems to be answered appropriately, but does not appear that she is back to her baseline.    ____________________________________________   FINAL CLINICAL IMPRESSION(S) / ED DIAGNOSES  Final diagnoses:  Urinary tract infection without hematuria, site unspecified  Seizure-like activity (HCC)  Altered mental status, unspecified altered mental status type     ED Discharge Orders    None       Note:  This document was prepared using Dragon voice recognition software and may include unintentional dictation errors.    Chinita Pester, FNP 08/17/18 0244    Dionne Bucy, MD 08/19/18 1112

## 2018-08-16 NOTE — ED Provider Notes (Signed)
-----------------------------------------   9:20 PM on 08/16/2018 -----------------------------------------  Medical screening examination/treatment/procedure(s) were conducted as a shared visit with non-physician practitioner(s) and myself.  I personally evaluated the patient during the encounter.  This patient was recently admitted to [facility] for a stroke.  Today she was observed with possible seizure-like activity, followed by a postictal phase.  On my exam in the ED the patient's vital signs are normal.  She is somewhat weak and sleepy appearing.  She was able to answer a few questions with a low mumbling voice but not able to provide much history.  Neuro exam is nonfocal; she is following commands and moving all extremities.  We will obtain a CT, labs for seizure work-up, infection/sepsis work-up as well, and reassess.  ED ECG REPORT I, Dionne Bucy, the attending physician, personally viewed and interpreted this ECG.  Date: 08/16/2018 EKG Time: 2025 Rate: 52 Rhythm: normal sinus rhythm with PACs QRS Axis: normal Intervals: normal ST/T Wave abnormalities: normal Narrative Interpretation: no evidence of acute ischemia  ----------------------------------------- 11:11 PM on 08/16/2018 -----------------------------------------  CT head shows no acute abnormalities.  Work-up reveals findings consistent with UTI especially given the negative UA from 4 days ago.  The patient continues to be altered and will require admission.  I initiated antibiotics and signed the patient out to the hospitalist Dr. Caryn Bee.    Dionne Bucy, MD 08/16/18 2312

## 2018-08-16 NOTE — ED Triage Notes (Signed)
Pt arrived via EMS from [place] after being seen "shaking and foaming at the mouth" LKW 1920 was discharged today from [facility] where she was seen for seizures.

## 2018-08-16 NOTE — ED Notes (Signed)
Patient transported to CT 

## 2018-08-16 NOTE — Progress Notes (Signed)
Discharge to: [facility] Anticipated discharge date: 08/16/18 Family notified: Yes, son, by phone Transportation by: [facility]  Report #: [phone]  CSW signing off.  Blenda Nicely LCSW [phone]

## 2018-08-16 NOTE — ED Notes (Addendum)
Pt presented to ED with skin tear to left wrist and bruising to bilateral arms

## 2018-08-16 NOTE — Progress Notes (Addendum)
Attempted to call report to [facility] x 1, no answer. Will attempt again in 15 minutes  Gave report to Charise Killian, RN at [facility]. IV removed.

## 2018-08-16 NOTE — [place] FL2 (Signed)
[place] MEDICAID FL2 LEVEL OF CARE SCREENING TOOL     IDENTIFICATION  Patient Name: Stacey Baker Birthdate: 07/18/1932 Sex: female Admission Date (Current Location): 08/12/2018  [facility] and [place] Number:  Chiropodist and Address:  [facility], [address], [place], [place] [address]      Provider Number: [id number]  Attending Physician Name and Address:  Kathlen Mody, MD  Relative Name and Phone Number:  Mcnerney, son, [phone]    Current Level of Care: Hospital Recommended Level of Care: Assisted Living Facility Prior Approval Number:    Date Approved/Denied:   PASRR Number:    Discharge Plan: Other (Comment)(ALF)    Current Diagnoses: Patient Active Problem List   Diagnosis Date Noted  . Acute metabolic encephalopathy 08/13/2018  . Seizure (HCC) 08/12/2018  . Expressive aphasia   . Palliative care encounter   . Goals of care, counseling/discussion   . Acute on chronic renal failure (HCC) 02/26/2017  . Anemia, chronic renal failure, stage 3 (moderate) (HCC) 02/26/2017  . Guaiac positive stools 02/26/2017  . Fall 02/26/2017  . Hypoxia   . Atrial tachycardia (HCC)   . Generalized weakness 07/05/2016  . Thrombocytopenia (HCC) 07/05/2016  . UTI (urinary tract infection) 07/05/2016  . Acute respiratory failure (HCC) 07/05/2016  . Pleural effusion 07/05/2016  . CKD (chronic kidney disease), stage III (HCC)   . Pericardial effusion   . Sinus bradycardia   . Acute renal failure (ARF) (HCC) 12/31/2015  . Chronic renal insufficiency 09/15/2015  . Bradycardia 09/15/2015  . Cellulitis 09/13/2015  . Parkinson disease (HCC) 09/13/2015  . HTN (hypertension) 09/13/2015  . HLD (hyperlipidemia) 09/13/2015  . Depression 09/13/2015  . Dementia (HCC) 09/13/2015  . Hyperkalemia 09/13/2015  . TIA (transient ischemic attack) 08/27/2015    Orientation RESPIRATION BLADDER Height & Weight     Self  Normal Incontinent Weight: 129 lb  6.6 oz (58.7 kg) Height:     BEHAVIORAL SYMPTOMS/MOOD NEUROLOGICAL BOWEL NUTRITION STATUS      Continent Diet(Dysphagia 1 (Puree);Nectar-thick liquid )  AMBULATORY STATUS COMMUNICATION OF NEEDS Skin   Extensive Assist Verbally Other (Comment)(MASD on buttocks)                       Personal Care Assistance Level of Assistance  Bathing, Feeding, Dressing Bathing Assistance: Maximum assistance Feeding assistance: Independent Dressing Assistance: Maximum assistance     Functional Limitations Info  Sight, Hearing, Speech Sight Info: Adequate Hearing Info: Adequate Speech Info: Adequate    SPECIAL CARE FACTORS FREQUENCY  PT (By licensed PT), OT (By licensed OT)     PT Frequency: 3x week OT Frequency: 2x week            Contractures Contractures Info: Not present    Additional Factors Info  Code Status, Allergies, Psychotropic Code Status Info: DNR Allergies Info: PENICILLINS, PLAVIX CLOPIDOGREL BISULFATE  Psychotropic Info: donepezil (ARICEPT) tablet 10 mg daily at bedtime PO; escitalopram (LEXAPRO) tablet 10 mg daily PO; QUEtiapine (SEROQUEL) tablet 25 mg daily at bedtime PO         Current Medications (08/16/2018):  This is the current hospital active medication list Current Facility-Administered Medications  Medication Dose Route Frequency Provider Last Rate Last Dose  . 0.9 %  sodium chloride infusion   Intravenous Continuous Kathlen Mody, MD 75 mL/hr at 08/15/18 2319    . carbidopa-levodopa (SINEMET IR) 25-100 MG per tablet immediate release 1 tablet  1 tablet Oral TID Kathlen Mody,  MD   1 tablet at 08/16/18 1010  . donepezil (ARICEPT) tablet 10 mg  10 mg Oral QHS Kathlen Mody, MD   10 mg at 08/15/18 2322  . escitalopram (LEXAPRO) tablet 10 mg  10 mg Oral Daily Kathlen Mody, MD   10 mg at 08/16/18 1010  . fluticasone furoate-vilanterol (BREO ELLIPTA) 100-25 MCG/INH 1 puff  1 puff Inhalation Daily Kathlen Mody, MD   1 puff at 08/14/18 1006  . heparin  injection 5,000 Units  5,000 Units Subcutaneous Q8H Kathlen Mody, MD   5,000 Units at 08/16/18 0533  . levETIRAcetam (KEPPRA) 250 mg in sodium chloride 0.9 % 100 mL IVPB  250 mg Intravenous BID Kathlen Mody, MD 410 mL/hr at 08/16/18 1012 250 mg at 08/16/18 1012  . MEDLINE mouth rinse  15 mL Mouth Rinse BID Kathlen Mody, MD   15 mL at 08/15/18 2321  . QUEtiapine (SEROQUEL) tablet 25 mg  25 mg Oral QHS Kathlen Mody, MD   25 mg at 08/15/18 2322  . RESOURCE THICKENUP CLEAR   Oral PRN Kathlen Mody, MD      . umeclidinium bromide (INCRUSE ELLIPTA) 62.5 MCG/INH 1 puff  1 puff Inhalation Daily Kathlen Mody, MD   1 puff at 08/14/18 1006     Discharge Medications: Please see discharge summary for a list of discharge medications.  Relevant Imaging Results:  Relevant Lab Results:   Additional Information SS#[id number] [place], [place]

## 2018-08-17 ENCOUNTER — Other Ambulatory Visit: Payer: Self-pay

## 2018-08-17 ENCOUNTER — Inpatient Hospital Stay

## 2018-08-17 DIAGNOSIS — R4182 Altered mental status, unspecified: Secondary | ICD-10-CM

## 2018-08-17 DIAGNOSIS — R569 Unspecified convulsions: Secondary | ICD-10-CM

## 2018-08-17 LAB — BASIC METABOLIC PANEL
ANION GAP: 9 (ref 5–15)
BUN: 26 mg/dL — AB (ref 8–23)
CO2: 26 mmol/L (ref 22–32)
Calcium: 8.7 mg/dL — ABNORMAL LOW (ref 8.9–10.3)
Chloride: 107 mmol/L (ref 98–111)
Creatinine, Ser: 1.41 mg/dL — ABNORMAL HIGH (ref 0.44–1.00)
GFR, EST AFRICAN AMERICAN: 38 mL/min — AB (ref >60)
GFR, EST NON AFRICAN AMERICAN: 33 mL/min — AB (ref >60)
Glucose, Bld: 101 mg/dL — ABNORMAL HIGH (ref 70–99)
Potassium: 4.1 mmol/L (ref 3.5–5.1)
SODIUM: 142 mmol/L (ref 135–145)

## 2018-08-17 LAB — CBC
HEMATOCRIT: 34.2 % — AB (ref 36.0–46.0)
Hemoglobin: 11 g/dL — ABNORMAL LOW (ref 12.0–15.0)
MCH: 30.1 pg (ref 26.0–34.0)
MCHC: 32.2 g/dL (ref 30.0–36.0)
MCV: 93.4 fL (ref 80.0–100.0)
NRBC: 0 % (ref 0.0–0.2)
PLATELETS: 122 10*3/uL — AB (ref 150–400)
RBC: 3.66 MIL/uL — ABNORMAL LOW (ref 3.87–5.11)
RDW: 13.6 % (ref 11.5–15.5)
WBC: 6 10*3/uL (ref 4.0–10.5)

## 2018-08-17 LAB — MRSA PCR SCREENING: MRSA by PCR: NEGATIVE

## 2018-08-17 MED ORDER — TRAZODONE HCL 50 MG PO TABS: 25.0000 mg | ORAL_TABLET | Freq: Every evening | ORAL | Status: DC | PRN

## 2018-08-17 MED ORDER — CARBIDOPA-LEVODOPA 25-100 MG PO TABS
1.0000 | ORAL_TABLET | Freq: Three times a day (TID) | ORAL | Status: DC
  Administered 2018-08-17 – 2018-08-18 (×6): 1 via ORAL
  Filled 2018-08-17 (×19): qty 1

## 2018-08-17 MED ORDER — DOCUSATE SODIUM 100 MG PO CAPS
100.0000 mg | ORAL_CAPSULE | Freq: Two times a day (BID) | ORAL | Status: DC
  Administered 2018-08-17 – 2018-08-18 (×4): 100 mg via ORAL
  Filled 2018-08-17 (×4): qty 1

## 2018-08-17 MED ORDER — SODIUM CHLORIDE 0.9 % IV SOLN
100.0000 mg | Freq: Once | INTRAVENOUS | Status: AC
  Administered 2018-08-17: 100 mg via INTRAVENOUS
  Filled 2018-08-17: qty 10

## 2018-08-17 MED ORDER — DONEPEZIL HCL 5 MG PO TABS
10.0000 mg | ORAL_TABLET | Freq: Every day | ORAL | Status: DC
  Administered 2018-08-17 – 2018-08-18 (×2): 10 mg via ORAL
  Filled 2018-08-17 (×6): qty 2

## 2018-08-17 MED ORDER — ONDANSETRON HCL 4 MG PO TABS: 4.0000 mg | ORAL_TABLET | Freq: Four times a day (QID) | ORAL | Status: DC | PRN

## 2018-08-17 MED ORDER — QUETIAPINE FUMARATE 25 MG PO TABS: 25.0000 mg | ORAL_TABLET | Freq: Every evening | ORAL | Status: DC | PRN

## 2018-08-17 MED ORDER — ESCITALOPRAM OXALATE 10 MG PO TABS
10.0000 mg | ORAL_TABLET | Freq: Every day | ORAL | Status: DC
  Administered 2018-08-17 – 2018-08-18 (×2): 10 mg via ORAL
  Filled 2018-08-17 (×6): qty 1

## 2018-08-17 MED ORDER — HYDROCODONE-ACETAMINOPHEN 5-325 MG PO TABS: 1.0000 | ORAL_TABLET | ORAL | Status: DC | PRN

## 2018-08-17 MED ORDER — FERROUS SULFATE 325 (65 FE) MG PO TABS
325.0000 mg | ORAL_TABLET | Freq: Two times a day (BID) | ORAL | Status: DC
  Administered 2018-08-17 – 2018-08-18 (×4): 325 mg via ORAL
  Filled 2018-08-17 (×5): qty 1

## 2018-08-17 MED ORDER — RESOURCE THICKENUP CLEAR PO POWD: 1.0000 g | Freq: Three times a day (TID) | ORAL | Status: DC

## 2018-08-17 MED ORDER — LEVETIRACETAM 250 MG PO TABS
250.0000 mg | ORAL_TABLET | Freq: Two times a day (BID) | ORAL | Status: DC
  Administered 2018-08-17: 250 mg via ORAL
  Filled 2018-08-17 (×2): qty 1

## 2018-08-17 MED ORDER — LORAZEPAM 0.5 MG PO TABS
0.5000 mg | ORAL_TABLET | ORAL | Status: DC | PRN
  Administered 2018-08-17 – 2018-08-18 (×2): 0.5 mg via ORAL
  Filled 2018-08-17 (×2): qty 1

## 2018-08-17 MED ORDER — ASPIRIN EC 325 MG PO TBEC
325.0000 mg | DELAYED_RELEASE_TABLET | Freq: Every day | ORAL | Status: DC
  Administered 2018-08-17 – 2018-08-18 (×2): 325 mg via ORAL
  Filled 2018-08-17 (×2): qty 1

## 2018-08-17 MED ORDER — MORPHINE SULFATE (CONCENTRATE) 10 MG/0.5ML PO SOLN: 0.2500 mg | ORAL | Status: DC | PRN

## 2018-08-17 MED ORDER — BISACODYL 5 MG PO TBEC
5.0000 mg | DELAYED_RELEASE_TABLET | Freq: Every day | ORAL | Status: DC | PRN
  Administered 2018-08-18: 09:00:00 5 mg via ORAL
  Filled 2018-08-17: qty 1

## 2018-08-17 MED ORDER — FLUTICASONE FUROATE-VILANTEROL 100-25 MCG/INH IN AEPB
1.0000 | INHALATION_SPRAY | Freq: Every day | RESPIRATORY_TRACT | Status: DC
  Filled 2018-08-17: qty 28

## 2018-08-17 MED ORDER — UMECLIDINIUM BROMIDE 62.5 MCG/INH IN AEPB
1.0000 | INHALATION_SPRAY | Freq: Every day | RESPIRATORY_TRACT | Status: DC
  Filled 2018-08-17: qty 7

## 2018-08-17 MED ORDER — HYDRALAZINE HCL 10 MG PO TABS
10.0000 mg | ORAL_TABLET | Freq: Three times a day (TID) | ORAL | Status: DC
  Administered 2018-08-17 – 2018-08-19 (×5): 10 mg via ORAL
  Filled 2018-08-17 (×17): qty 1

## 2018-08-17 MED ORDER — AMLODIPINE BESYLATE 10 MG PO TABS
10.0000 mg | ORAL_TABLET | Freq: Every day | ORAL | Status: DC
  Administered 2018-08-17 – 2018-08-18 (×2): 10 mg via ORAL
  Filled 2018-08-17: qty 2
  Filled 2018-08-17: qty 1

## 2018-08-17 MED ORDER — SODIUM CHLORIDE 0.9 % IV SOLN
INTRAVENOUS | Status: DC
  Administered 2018-08-17: 10:00:00 via INTRAVENOUS

## 2018-08-17 MED ORDER — HEPARIN SODIUM (PORCINE) 5000 UNIT/ML IJ SOLN
5000.0000 [IU] | Freq: Three times a day (TID) | INTRAMUSCULAR | Status: DC
  Administered 2018-08-17 – 2018-08-22 (×15): 5000 [IU] via SUBCUTANEOUS
  Filled 2018-08-17 (×17): qty 1

## 2018-08-17 MED ORDER — EQ SALINE SOLUTION/SENSITIVE SOLN: 1.0000 "application " | Status: DC

## 2018-08-17 MED ORDER — ONDANSETRON HCL 4 MG/2ML IJ SOLN: 4.0000 mg | Freq: Four times a day (QID) | INTRAMUSCULAR | Status: DC | PRN

## 2018-08-17 MED ORDER — ACETAMINOPHEN 325 MG PO TABS: 650.0000 mg | ORAL_TABLET | Freq: Four times a day (QID) | ORAL | Status: DC | PRN

## 2018-08-17 MED ORDER — SODIUM CHLORIDE 0.9 % IV SOLN
1.0000 g | INTRAVENOUS | Status: AC
  Administered 2018-08-17 – 2018-08-20 (×4): 1 g via INTRAVENOUS
  Filled 2018-08-17 (×4): qty 1

## 2018-08-17 MED ORDER — VITAMIN B-12 1000 MCG PO TABS
1000.0000 ug | ORAL_TABLET | Freq: Every day | ORAL | Status: DC
  Administered 2018-08-18: 1000 ug via ORAL
  Filled 2018-08-17: qty 1

## 2018-08-17 MED ORDER — LACOSAMIDE 50 MG PO TABS
100.0000 mg | ORAL_TABLET | Freq: Two times a day (BID) | ORAL | Status: DC
  Administered 2018-08-17 – 2018-08-18 (×3): 100 mg via ORAL
  Filled 2018-08-17 (×3): qty 2

## 2018-08-17 MED ORDER — OCUVITE-LUTEIN PO CAPS
1.0000 | ORAL_CAPSULE | Freq: Every day | ORAL | Status: DC
  Administered 2018-08-18: 1 via ORAL
  Filled 2018-08-17 (×5): qty 1

## 2018-08-17 MED ORDER — RISAQUAD PO CAPS
1.0000 | ORAL_CAPSULE | Freq: Two times a day (BID) | ORAL | Status: DC
  Administered 2018-08-17 – 2018-08-18 (×3): 1 via ORAL
  Filled 2018-08-17 (×3): qty 1

## 2018-08-17 MED ORDER — ACETAMINOPHEN 650 MG RE SUPP: 650.0000 mg | Freq: Four times a day (QID) | RECTAL | Status: DC | PRN

## 2018-08-17 NOTE — ED Notes (Signed)
Neurology to bedside at this time. 

## 2018-08-17 NOTE — H&P (Signed)
[facility] - [place] at [facility]   PATIENT NAME: Stacey Baker    MR#:  [id number]  DATE OF BIRTH:  1932/01/15  DATE OF ADMISSION:  08/16/2018  PRIMARY CARE PHYSICIAN: Merlene Pulling, PA-C   REQUESTING/REFERRING PHYSICIAN:   CHIEF COMPLAINT: Altered mental status   HISTORY OF PRESENT ILLNESS: Stacey Baker  is a 84 y.o. female with a known history of dementia, hypertension, hyperlipidemia, Parkinson's and recent stroke, for which she was admitted to [facility] 5 days ago. Patient was sent to emergency room from nursing home for altered mental status.  The reports from nursing home state that patient was observed with possible seizure-like activity, followed by postictal phase. During my examination in the emergency room, patient has been lethargic and confused, but following some commands and answering some questions. Blood test done emergency room are notable for creatinine at 1.89, BUN 30, glucose 124, WBC 5.2.  UA is positive for UTI. No acute abnormalities per brain CT. Patient is admitted for further evaluation and treatment.  PAST MEDICAL HISTORY:   Past Medical History:  Diagnosis Date  . Apraxia following cerebral infarction   . CKD (chronic kidney disease), stage III (HCC)    a. With acute worsening/AKI noted on several admissions.  . Dementia (HCC)   . Dementia without behavioral disturbance (HCC)   . Depression   . Encephalopathy   . Hyperkalemia    a. 08/2015  . Hyperlipidemia   . Hyperlipidemia   . Hypertension   . Iron deficiency anemia   . Major depressive disorder   . Muscle weakness   . Parkinson disease (HCC)   . Parkinson's disease (HCC)   . Pericardial effusion    a. 06/2016 Echo: Ef 60-65%, no rwma, Gr1 DD, mild MR, mildly dil LA, PASP , mod circumferential pericardial effusion - no hemodynamic compromise.  . Sinus bradycardia    a. 08/2015 in setting of beta blocker and hyperkalemia.  . Stroke (HCC)   . TIA  (transient ischemic attack)    a. 08/2015 - essentially neg w/u for stroke.  Marland Kitchen UTI (urinary tract infection)     PAST SURGICAL HISTORY:  Past Surgical History:  Procedure Laterality Date  . ABDOMINAL HYSTERECTOMY    . APPENDECTOMY    . CHOLECYSTECTOMY      SOCIAL HISTORY:  Social History   Tobacco Use  . Smoking status: Never Smoker  . Smokeless tobacco: Never Used  Substance Use Topics  . Alcohol use: No    Alcohol/week: 0.0 standard drinks    FAMILY HISTORY:  Family History  Problem Relation Age of Onset  . Heart disease Mother   . Hypertension Other     DRUG ALLERGIES:  Allergies  Allergen Reactions  . Penicillins Other (See Comments)    "Allergic," per [facility] Has patient had a PCN reaction causing immediate rash, facial/tongue/throat swelling, SOB or lightheadedness with hypotension: Unk Has patient had a PCN reaction causing severe rash involving mucus membranes or skin necrosis: Unk Has patient had a PCN reaction that required hospitalization: Unk Has patient had a PCN reaction occurring within the last 10 years: Unk If all of the above answers are "NO", then may proceed with Cephalosporin use.   Marland Kitchen Plavix [Clopidogrel Bisulfate] Other (See Comments)    "Allergic," per MAR    REVIEW OF SYSTEMS:   Unable to obtain, due to patient's altered mental status.  MEDICATIONS AT HOME:  Prior to Admission medications   Medication Sig Start Date End  Date Taking? Authorizing Provider  acetaminophen (TYLENOL) 325 MG tablet Take 650 mg by mouth every 4 (four) hours as needed.    Yes [provider]  amLODipine (NORVASC) 10 MG tablet Take 10 mg by mouth daily. 08/02/18  Yes [provider]  aspirin EC 325 MG tablet Take 325 mg by mouth daily.    Yes [provider]  BREO ELLIPTA 100-25 MCG/INH AEPB Inhale 1 puff into the lungs daily. 07/27/18  Yes [provider]  calcitRIOL (ROCALTROL) 0.25 MCG capsule Take 0.25 mcg by mouth daily.   06/08/17  Yes [provider]  carbidopa-levodopa (SINEMET IR) 25-100 MG tablet Take 1 tablet by mouth 3 (three) times daily.   Yes [provider]  donepezil (ARICEPT) 10 MG tablet Take 10 mg by mouth at bedtime.  12/22/16  Yes [provider]  escitalopram (LEXAPRO) 10 MG tablet Take 10 mg by mouth daily.   Yes [provider]  ferrous sulfate 325 (65 FE) MG tablet Take 325 mg by mouth 2 (two) times daily with a meal.    Yes [provider]  levETIRAcetam (KEPPRA) 250 MG tablet Take 1 tablet (250 mg total) by mouth 2 (two) times daily. 08/14/18  Yes Kathlen Mody, MD  loperamide (IMODIUM) 2 MG capsule Take by mouth as needed for diarrhea or loose stools.   Yes [provider]  LORazepam (ATIVAN) 0.5 MG tablet Take 0.5 mg by mouth every 4 (four) hours as needed for anxiety.   Yes [provider]  Maltodextrin-Xanthan Gum (RESOURCE THICKENUP CLEAR) POWD WITH EVERY MEAL 08/14/18  Yes Kathlen Mody, MD  morphine (ROXANOL) 20 MG/ML concentrated solution Take 0.25 mg by mouth every hour as needed for severe pain.    Yes [provider]  Multiple Vitamins-Minerals (PRESERVISION AREDS 2 PO) Take 2 capsules by mouth daily.   Yes [provider]  Polyethylene Glycol 3350 (MIRALAX PO) Take 17 g by mouth as needed (Constipation).    Yes [provider]  Probiotic Product (PROBIOTIC DAILY) CAPS Take 1 capsule by mouth 2 (two) times daily.    Yes [provider]  QUEtiapine (SEROQUEL) 25 MG tablet Take 25 mg by mouth at bedtime.   Yes [provider]  umeclidinium bromide (INCRUSE ELLIPTA) 62.5 MCG/INH AEPB Inhale 1 puff into the lungs daily. 02/04/18  Yes [provider]  vitamin B-12 (CYANOCOBALAMIN) 1000 MCG tablet Take 1,000 mcg by mouth daily.   Yes [provider]  miconazole (BAZA ANTIFUNGAL) 2 % cream Apply 1 application topically 2 (two) times daily as needed. To sacrum every shift  for redness after each toilet    [provider]  Soft Lens Products (EQ SALINE SOLUTION/SENSITIVE) SOLN Apply 1 application topically every other day. Apply to neck topically once every other day for wound until healed.    [provider]      PHYSICAL EXAMINATION:   VITAL SIGNS: Blood pressure (!) 163/66, pulse (!) 56, temperature 99.1 F (37.3 C), temperature source Rectal, resp. rate 16, SpO2 96 %.  GENERAL:  84 y.o.-year-old patient lying in the bed with no acute distress.  She seems acutely ill, but not toxic. EYES: Pupils equal, round, reactive to light and accommodation. No scleral icterus.  HEENT: Head atraumatic, normocephalic. Oropharynx and nasopharynx clear.  NECK:  Supple, no jugular venous distention. No thyroid enlargement, no tenderness.  LUNGS: Normal breath sounds bilaterally, no wheezing, rales,rhonchi or crepitation. No use of accessory muscles of respiration.  CARDIOVASCULAR: S1,  S2 normal. No S3/S4.  ABDOMEN: Soft, nontender, nondistended. Bowel sounds present. No organomegaly or mass.  EXTREMITIES: No pedal edema, cyanosis, or clubbing.  NEUROLOGIC:Unable to perform exam, due to pt's AMS. She is moving all her extremities, no focal weakness appreciated. PSYCHIATRIC: The patient is alert, but lethargic, confused.  SKIN: No obvious rash, lesion, or ulcer.   LABORATORY PANEL:   CBC Recent Labs  Lab 08/12/18 2043 08/12/18 2056 08/13/18 0420 08/16/18 2313  WBC 4.7  --  4.1 5.2  HGB 10.5* 10.5* 9.8* 9.3*  HCT 33.8* 31.0* 31.7* 29.5*  PLT 126*  --  115* 95*  MCV 94.4  --  93.8 96.1  MCH 29.3  --  29.0 30.3  MCHC 31.1  --  30.9 31.5  RDW 13.5  --  13.4 13.8  LYMPHSABS 1.6  --   --  1.0  MONOABS 0.4  --   --  0.5  EOSABS 0.2  --   --  0.1  BASOSABS 0.0  --   --  0.0   ------------------------------------------------------------------------------------------------------------------  Chemistries  Recent Labs  Lab 08/12/18 2043  08/12/18 2056 08/13/18 0420 08/14/18 0932 08/16/18 2052  NA 142 141 142 141 142  K 4.5 4.6 3.9 4.4 4.4  CL 108 107 108 112* 109  CO2 24  --  29 19* 24  GLUCOSE 114* 114* 99 103* 124*  BUN 32* 34* 29* 23 30*  CREATININE 2.03* 2.20* 1.82* 1.53* 1.89*  CALCIUM 9.1  --  8.7* 8.5* 8.2*  AST 11*  --  10*  --  18  ALT <5  --  <5  --  <5  ALKPHOS 71  --  63  --  99  BILITOT 0.5  --  0.6  --  0.5   ------------------------------------------------------------------------------------------------------------------ CrCl cannot be calculated (Unknown ideal weight.). ------------------------------------------------------------------------------------------------------------------ No results for input(s): TSH, T4TOTAL, T3FREE, THYROIDAB in the last 72 hours.  Invalid input(s): FREET3   Coagulation profile Recent Labs  Lab 08/12/18 2043  INR 1.09   ------------------------------------------------------------------------------------------------------------------- No results for input(s): DDIMER in the last 72 hours. -------------------------------------------------------------------------------------------------------------------  Cardiac Enzymes Recent Labs  Lab 08/16/18 2052  TROPONINI <0.03   ------------------------------------------------------------------------------------------------------------------ Invalid input(s): POCBNP  ---------------------------------------------------------------------------------------------------------------  Urinalysis    Component Value Date/Time   COLORURINE YELLOW (A) 08/16/2018 2052   APPEARANCEUR TURBID (A) 08/16/2018 2052   APPEARANCEUR Cloudy 01/12/2015 2035   LABSPEC 1.020 08/16/2018 2052   LABSPEC 1.018 01/12/2015 2035   PHURINE 5.0 08/16/2018 2052   GLUCOSEU NEGATIVE 08/16/2018 2052   GLUCOSEU Negative 01/12/2015 2035   HGBUR SMALL (A) 08/16/2018 2052   BILIRUBINUR NEGATIVE 08/16/2018 2052   BILIRUBINUR Negative 01/12/2015 2035    KETONESUR 5 (A) 08/16/2018 2052   PROTEINUR 100 (A) 08/16/2018 2052   NITRITE NEGATIVE 08/16/2018 2052   LEUKOCYTESUR LARGE (A) 08/16/2018 2052   LEUKOCYTESUR 3+ 01/12/2015 2035     RADIOLOGY: Ct Head Wo Contrast  Result Date: 08/16/2018 CLINICAL DATA:  Altered mental status. EXAM: CT HEAD WITHOUT CONTRAST TECHNIQUE: Contiguous axial images were obtained from the base of the skull through the vertex without intravenous contrast. COMPARISON:  08/12/2018 and 12/08/2017 FINDINGS: Brain: Ventricles and cisterns as well as CSF spaces are mildly prominent and unchanged compatible with age related atrophy. There is mild chronic ischemic microvascular disease. There is no mass, mass effect, shift of midline structures or acute hemorrhage. No evidence of acute infarction. Vascular: No hyperdense vessel or unexpected calcification. Skull: Normal. Negative for fracture or focal lesion. Sinuses/Orbits: No acute finding.  Other: None. IMPRESSION: No acute findings. Minimal chronic ischemic microvascular disease and stable age related atrophic change. Electronically Signed   By: Elberta Fortis M.D.   On: 08/16/2018 20:50    EKG: Orders placed or performed during the hospital encounter of 08/16/18  . EKG 12-Lead  . EKG 12-Lead  . ED EKG  . ED EKG  . EKG 12-Lead  . EKG 12-Lead    IMPRESSION AND PLAN:  1. Acute encephalopathy, likely sec to UTI. Will r/o neuro causes. Neurology consulted for further eval and tx.  2.  Acute UTI, will start IV Rocephin, will awaiting for urine culture result. 3.  ARF/CKD 3, likely prerenal.  Start gentle IV hydration and continue to monitor kidney function closely and avoid nephrotoxic medications. 4.  Seizure disorder, on Keppra, renally dose. 5.  Parkinson's disease, continue maintenance therapy. 6.  Hypertension, stable, resume home medications. 7.  Dementia, continue Aricept.  All the records are reviewed and case discussed with ED provider.   CODE STATUS:  DNR Code Status History    Date Active Date Inactive Code Status Order ID Comments User Context   08/13/2018 0318 08/16/2018 1650 DNR [id number]  John Giovanni, MD Inpatient   08/13/2018 0318 08/13/2018 0318 DNR [id number]  John Giovanni, MD Inpatient   02/26/2017 2325 03/09/2017 1922 DNR [id number]  Katharina Caper, MD Inpatient   07/05/2016 1747 07/09/2016 1951 Full Code [id number]  Marguarite Arbour, MD ED   07/03/2016 1104 07/03/2016 1726 Full Code [id number]  Milagros Loll, MD ED   12/31/2015 1742 01/06/2016 1912 Full Code [id number]  [name], MD Inpatient   09/13/2015 2255 09/15/2015 2025 Full Code [id number]  Oralia Manis, MD Inpatient   08/28/2015 0814 08/29/2015 1649 DNR [id number]  Delfino Lovett, MD Inpatient   08/27/2015 2325 08/28/2015 0814 Full Code [id number]  Hower, Cletis Athens, MD ED    Questions for Most Recent Historical Code Status (Order [id number])    Question Answer Comment   In the event of cardiac or respiratory ARREST Do not call a "code blue"    In the event of cardiac or respiratory ARREST Do not perform Intubation, CPR, defibrillation or ACLS    In the event of cardiac or respiratory ARREST Use medication by any route, position, wound care, and other measures to relive pain and suffering. May use oxygen, suction and manual treatment of airway obstruction as needed for comfort.        TOTAL TIME TAKING CARE OF THIS PATIENT:55 minutes.    Cammy Copa M.D on 08/17/2018 at 12:45 AM  Between 7am to 6pm - Pager - [phone]  After 6pm go to [email or web address] - password [email or web address] [facility] [place] at [facility]  [phone]  CC: Primary care physician; Merlene Pulling, PA-C

## 2018-08-17 NOTE — Progress Notes (Signed)
PT Cancellation Note  Patient Details Name: Krystyna Cleckley MRN: [id number] DOB: [birth year]   Cancelled Treatment:    Reason Eval/Treat Not Completed: Other (comment). Order received and chart reviewed. Pt currently off unit secondary to testing, unavailable for consult at this time. Will re-attempt next date.    Damiyah Ditmars 08/17/2018, 3:58 PM  Elizabeth Palau, PT, DPT [phone]

## 2018-08-17 NOTE — Progress Notes (Signed)
Patient ID: Stacey Baker, female   DOB: [birth year], 84 y.o.   MRN: [id number]  Spoke with son on the phone and given update

## 2018-08-17 NOTE — ED Notes (Signed)
Patient on stretcher in nad.  Patient had large amount urine, so cleaned and changed.  purwick applied.  States she is not hungry.  Lungs clear.  abd soft with bowel sounds.   Skin intact on peri and coccyx area.  Turned to right side slightly.  Cardiac monitor sinus brady.

## 2018-08-17 NOTE — NC FL2 (Signed)
[place] MEDICAID FL2 LEVEL OF CARE SCREENING TOOL     IDENTIFICATION  Patient Name: Stacey Baker Birthdate: Mar 14, 1932 Sex: female Admission Date (Current Location): 08/16/2018  [facility] and [place] Number:  Chiropodist and Address:  [facility], [facility], [address], [place], [place] [address]      Provider Number: [id number]  Attending Physician Name and Address:  Alford Highland, MD  Relative Name and Phone Number:  Stacey Baker     Current Level of Care: Hospital Recommended Level of Care: Assisted Living Facility Prior Approval Number:    Date Approved/Denied:   PASRR Number:    Discharge Plan: Domiciliary (Rest home)    Current Diagnoses: Patient Active Problem List   Diagnosis Date Noted  . Acute UTI 08/16/2018  . Acute metabolic encephalopathy 08/13/2018  . Seizure (HCC) 08/12/2018  . Expressive aphasia   . Palliative care encounter   . Goals of care, counseling/discussion   . Acute on chronic renal failure (HCC) 02/26/2017  . Anemia, chronic renal failure, stage 3 (moderate) (HCC) 02/26/2017  . Guaiac positive stools 02/26/2017  . Fall 02/26/2017  . Hypoxia   . Atrial tachycardia (HCC)   . Generalized weakness 07/05/2016  . Thrombocytopenia (HCC) 07/05/2016  . UTI (urinary tract infection) 07/05/2016  . Acute respiratory failure (HCC) 07/05/2016  . Pleural effusion 07/05/2016  . CKD (chronic kidney disease), stage III (HCC)   . Pericardial effusion   . Sinus bradycardia   . Acute renal failure (ARF) (HCC) 12/31/2015  . Chronic renal insufficiency 09/15/2015  . Bradycardia 09/15/2015  . Cellulitis 09/13/2015  . Parkinson disease (HCC) 09/13/2015  . HTN (hypertension) 09/13/2015  . HLD (hyperlipidemia) 09/13/2015  . Depression 09/13/2015  . Dementia (HCC) 09/13/2015  . Hyperkalemia 09/13/2015  . TIA (transient ischemic attack) 08/27/2015    Orientation RESPIRATION BLADDER Height & Weight     Self  Normal  Incontinent Weight:   Height:     BEHAVIORAL SYMPTOMS/MOOD NEUROLOGICAL BOWEL NUTRITION STATUS  (none) (none) Continent Diet(Heart healthy )  AMBULATORY STATUS COMMUNICATION OF NEEDS Skin   Limited Assist Verbally Normal                       Personal Care Assistance Level of Assistance  Bathing, Feeding, Dressing Bathing Assistance: Limited assistance Feeding assistance: Independent Dressing Assistance: Limited assistance     Functional Limitations Info  Sight, Hearing, Speech Sight Info: Adequate Hearing Info: Adequate Speech Info: Adequate    SPECIAL CARE FACTORS FREQUENCY                       Contractures Contractures Info: Not present    Additional Factors Info  Code Status, Allergies Code Status Info: DNR Allergies Info:  Penicillins, Plavix Clopidogrel Bisulfate           Current Medications (08/17/2018):  This is the current hospital active medication list Current Facility-Administered Medications  Medication Dose Route Frequency Provider Last Rate Last Dose  . 0.9 %  sodium chloride infusion   Intravenous Continuous Alford Highland, MD 30 mL/hr at 08/17/18 1536    . acetaminophen (TYLENOL) tablet 650 mg  650 mg Oral Q6H PRN Cammy Copa, MD       Or  . acetaminophen (TYLENOL) suppository 650 mg  650 mg Rectal Q6H PRN Cammy Copa, MD      . acidophilus (RISAQUAD) capsule 1 capsule  1 capsule Oral BID Cammy Copa, MD      .  amLODipine (NORVASC) tablet 10 mg  10 mg Oral Daily Cammy Copa, MD   10 mg at 08/17/18 0934  . aspirin EC tablet 325 mg  325 mg Oral Daily Cammy Copa, MD   325 mg at 08/17/18 0935  . bisacodyl (DULCOLAX) EC tablet 5 mg  5 mg Oral Daily PRN Cammy Copa, MD      . carbidopa-levodopa (SINEMET IR) 25-100 MG per tablet immediate release 1 tablet  1 tablet Oral TID Cammy Copa, MD   1 tablet at 08/17/18 1052  . cefTRIAXone (ROCEPHIN) 1 g in sodium chloride 0.9 % 100 mL IVPB  1 g Intravenous Q24H Cammy Copa, MD       . docusate sodium (COLACE) capsule 100 mg  100 mg Oral BID Cammy Copa, MD   100 mg at 08/17/18 0935  . donepezil (ARICEPT) tablet 10 mg  10 mg Oral QHS Cammy Copa, MD      . escitalopram Judye Bos) tablet 10 mg  10 mg Oral Daily Cammy Copa, MD   10 mg at 08/17/18 0935  . ferrous sulfate tablet 325 mg  325 mg Oral BID WC Cammy Copa, MD   325 mg at 08/17/18 1052  . fluticasone furoate-vilanterol (BREO ELLIPTA) 100-25 MCG/INH 1 puff  1 puff Inhalation Daily Cammy Copa, MD      . heparin injection 5,000 Units  5,000 Units Subcutaneous Q8H Cammy Copa, MD   5,000 Units at 08/17/18 1447  . HYDROcodone-acetaminophen (NORCO/VICODIN) 5-325 MG per tablet 1-2 tablet  1-2 tablet Oral Q4H PRN Cammy Copa, MD      . lacosamide (VIMPAT) tablet 100 mg  100 mg Oral BID Thana Farr, MD      . LORazepam (ATIVAN) tablet 0.5 mg  0.5 mg Oral Q4H PRN Cammy Copa, MD   0.5 mg at 08/17/18 1500  . morphine CONCENTRATE 10 MG/0.5ML oral solution 0.2 mg  0.2 mg Oral Q1H PRN Cammy Copa, MD      . multivitamin-lutein (OCUVITE-LUTEIN) capsule 1 capsule  1 capsule Oral Daily Cammy Copa, MD      . ondansetron [facility]) tablet 4 mg  4 mg Oral Q6H PRN Cammy Copa, MD       Or  . ondansetron [facility]) injection 4 mg  4 mg Intravenous Q6H PRN Cammy Copa, MD      . QUEtiapine (SEROQUEL) tablet 25 mg  25 mg Oral QHS PRN Cammy Copa, MD      . RESOURCE THICKENUP CLEAR 1 g  1 g Oral TID PC Cammy Copa, MD      . traZODone (DESYREL) tablet 25 mg  25 mg Oral QHS PRN Cammy Copa, MD      . umeclidinium bromide (INCRUSE ELLIPTA) 62.5 MCG/INH 1 puff  1 puff Inhalation Daily Cammy Copa, MD      . vitamin B-12 (CYANOCOBALAMIN) tablet 1,000 mcg  1,000 mcg Oral Daily Cammy Copa, MD         Discharge Medications: Please see discharge summary for a list of discharge medications.  Relevant Imaging Results:  Relevant Lab Results:   Additional Information    Stacey Baker  Rinaldo Ratel, [address]

## 2018-08-17 NOTE — Procedures (Signed)
ELECTROENCEPHALOGRAM REPORT   Patient: Stacey Baker       Room #: 107A-AA EEG No. ID: [id number] Age: 84 y.o.        Sex: female Referring Physician: Renae Gloss Report Date:  08/17/2018        Interpreting Physician: Thana Farr  History: [name] is an 84 y.o. female with a history  Of seizures presenting with seizure and altered mental status  Medications:  Keppra, Rocephin, ASA, Sinemet, Aricept, Lexapro, Breo Ellipta, Apresoline  Conditions of Recording:  This is a 21 channel routine scalp EEG performed with bipolar and monopolar montages arranged in accordance to the international 10/20 system of electrode placement. One channel was dedicated to EKG recording.  The patient is in the awake and uncooperative state.  Description:  Artifact is prominent during the recording often obscuring the background rhythm. When able to be visualized the waking background activity consists of a low voltage, symmetrical, fairly well organized, 6 Hz theta activity, seen from the parieto-occipital and posterior temporal regions.  Low voltage fast activity, poorly organized, is seen anteriorly and is at times superimposed on more posterior regions.  A mixture of theta and alpha rhythms are seen from the central and temporal regions. The patient does not drowse or sleep. No epileptiform activity is noted.   Hyperventilation was not performed.  Intermittent photic stimulation was performed but failed to illicit any change in the tracing.    IMPRESSION: This is an abnormal EEG secondary to posterior background slowing.  This finding may be seen with a diffuse gray matter disturbance that is etiologically nonspecific, but may include a dementia, among other possibilities.  No epileptiform activity is noted.     Thana Farr, MD Neurology [phone] 08/17/2018, 8:37 PM

## 2018-08-17 NOTE — Progress Notes (Signed)
eeg completed ° °

## 2018-08-17 NOTE — Clinical Social Work Note (Signed)
Clinical Social Work Assessment  Patient Details  Name: Stacey Baker MRN: [id number] Date of Birth: 07/28/1932  Date of referral:  08/17/18               Reason for consult:  Facility Placement                Permission sought to share information with:  Case Manager, Magazine features editor, Family Supports Permission granted to share information::  Yes, Verbal Permission Granted  Name::        Agency::     Relationship::     Contact Information:     Housing/Transportation Living arrangements for the past 2 months:  Assisted Living Facility Source of Information:  Adult Children Patient Interpreter Needed:  None Criminal Activity/Legal Involvement Pertinent to Current Situation/Hospitalization:  No - Comment as needed Significant Relationships:  Adult Children Lives with:  Facility Resident Do you feel safe going back to the place where you live?  Yes Need for family participation in patient care:  Yes (Comment)  Care giving concerns:  Patient is a long term resident at [facility] living.    Social Worker assessment / plan:  CSW noted in chart review that patient is from [facility]. CSW attempted to meet with patient but she is confused and a poor historian. CSW contacted patient's son Stacey Baker [phone]. Son confirms that patient is from [facility] and he would like for her to return there. Patient is also followed [facility] at facility. CSW will follow for discharge planning.   Employment status:  Retired Health and safety inspector:  Other (Comment Required)(Hospice) PT Recommendations:  Not assessed at this time Information / Referral to community resources:     Patient/Family's Response to care:  Son thanked CSW for assistance   Patient/Family's Understanding of and Emotional Response to Diagnosis, Current Treatment, and Prognosis:  [name] understands current condition and discharge plan and is in  agreement.   Emotional Assessment Appearance:  Appears stated age Attitude/Demeanor/Rapport:    Affect (typically observed):  Pleasant, Stable Orientation:  Oriented to Self Alcohol / Substance use:  Not Applicable Psych involvement (Current and /or in the community):  No (Comment)  Discharge Needs  Concerns to be addressed:  Discharge Planning Concerns Readmission within the last 30 days:  Yes Current discharge risk:  None Barriers to Discharge:  Continued Medical Work up   [employer], LCSWA 08/17/2018, 3:49 PM

## 2018-08-17 NOTE — Progress Notes (Signed)
Patient ID: Stacey Baker, female   DOB: 13-Apr-1932, 84 y.o.   MRN: [id number]  [employer] PROGRESS NOTE  Stacey Baker WUJ:[id number] DOB: [birth year] DOA: 08/16/2018 PCP: Stacey Pulling, PA-C  HPI/Subjective: Patient answers some questions.  Knows her name.  States that she is mostly wheelchair-bound.  Sent in from facility for seizure.  Objective: Vitals:   08/17/18 1307 08/17/18 1318  BP: (!) 198/66 (!) 182/62  Pulse: (!) 58 (!) 58  Resp: 20   Temp: 97.9 F (36.6 C)   SpO2: 98%     There were no vitals filed for this visit.  ROS: Review of Systems  Unable to perform ROS: Mental acuity  Respiratory: Negative for shortness of breath.   Cardiovascular: Negative for chest pain.  Gastrointestinal: Positive for constipation and diarrhea. Negative for abdominal pain.  Musculoskeletal: Negative for back pain.   Exam: Physical Exam  HENT:  Nose: No mucosal edema.  Mouth/Throat: No oropharyngeal exudate or posterior oropharyngeal edema.  Eyes: Pupils are equal, round, and reactive to light. Conjunctivae, EOM and lids are normal.  Neck: No JVD present. Carotid bruit is not present. No edema present. No thyroid mass and no thyromegaly present.  Cardiovascular: S1 normal and S2 normal. Exam reveals no gallop.  No murmur heard. Pulses:      Dorsalis pedis pulses are 2+ on the right side, and 2+ on the left side.  Respiratory: No respiratory distress. She has decreased breath sounds in the right lower field and the left lower field. She has no wheezes. She has no rhonchi. She has no rales.  GI: Soft. Bowel sounds are normal. There is no tenderness.  Musculoskeletal:       Right shoulder: She exhibits no swelling.       Right ankle: She exhibits no swelling.       Left ankle: She exhibits no swelling.  Lymphadenopathy:    She has no cervical adenopathy.  Neurological: She is alert. No cranial nerve deficit.  Able to straight leg raise  Skin: Skin is warm. Nails show no  clubbing.  Bruising upper and lower extremities  Psychiatric:  Answer some yes or no questions.  Difficult to understand.      Data Reviewed: Basic Metabolic Panel: Recent Labs  Lab 08/12/18 2043 08/12/18 2056 08/13/18 0420 08/14/18 0932 08/16/18 2052 08/17/18 1329  NA 142 141 142 141 142 142  K 4.5 4.6 3.9 4.4 4.4 4.1  CL 108 107 108 112* 109 107  CO2 24  --  29 19* 24 26  GLUCOSE 114* 114* 99 103* 124* 101*  BUN 32* 34* 29* 23 30* 26*  CREATININE 2.03* 2.20* 1.82* 1.53* 1.89* 1.41*  CALCIUM 9.1  --  8.7* 8.5* 8.2* 8.7*   Liver Function Tests: Recent Labs  Lab 08/12/18 2043 08/13/18 0420 08/16/18 2052  AST 11* 10* 18  ALT <5 <5 <5  ALKPHOS 71 63 99  BILITOT 0.5 0.6 0.5  PROT 6.5 5.5* 6.3*  ALBUMIN 3.6 3.0* 3.3*    Recent Labs  Lab 08/13/18 0420 08/16/18 2052  AMMONIA 9 26   CBC: Recent Labs  Lab 08/12/18 2043 08/12/18 2056 08/13/18 0420 08/16/18 2313  WBC 4.7  --  4.1 5.2  NEUTROABS 2.5  --   --  3.6  HGB 10.5* 10.5* 9.8* 9.3*  HCT 33.8* 31.0* 31.7* 29.5*  MCV 94.4  --  93.8 96.1  PLT 126*  --  115* 95*   Cardiac Enzymes: Recent Labs  Lab 08/16/18 2052  TROPONINI <0.03    CBG: Recent Labs  Lab 08/12/18 2042  GLUCAP 106*    Recent Results (from the past 240 hour(s))  MRSA PCR Screening     Status: None   Collection Time: 08/13/18  3:14 AM  Result Value Ref Range Status   MRSA by PCR NEGATIVE NEGATIVE Final    Comment:        The GeneXpert MRSA Assay (FDA approved for NASAL specimens only), is one component of a comprehensive MRSA colonization surveillance program. It is not intended to diagnose MRSA infection nor to guide or monitor treatment for MRSA infections. Performed at [facility], [address] [place], [place] [address]      Studies: Ct Head Wo Contrast  Result Date: 08/16/2018 CLINICAL DATA:  Altered mental status. EXAM: CT HEAD WITHOUT CONTRAST TECHNIQUE: Contiguous axial images were obtained from  the base of the skull through the vertex without intravenous contrast. COMPARISON:  08/12/2018 and 12/08/2017 FINDINGS: Brain: Ventricles and cisterns as well as CSF spaces are mildly prominent and unchanged compatible with age related atrophy. There is mild chronic ischemic microvascular disease. There is no mass, mass effect, shift of midline structures or acute hemorrhage. No evidence of acute infarction. Vascular: No hyperdense vessel or unexpected calcification. Skull: Normal. Negative for fracture or focal lesion. Sinuses/Orbits: No acute finding. Other: None. IMPRESSION: No acute findings. Minimal chronic ischemic microvascular disease and stable age related atrophic change. Electronically Signed   By: Elberta Fortis M.D.   On: 08/16/2018 20:50    Scheduled Meds: . acidophilus  1 capsule Oral BID  . amLODipine  10 mg Oral Daily  . aspirin EC  325 mg Oral Daily  . carbidopa-levodopa  1 tablet Oral TID  . docusate sodium  100 mg Oral BID  . donepezil  10 mg Oral QHS  . escitalopram  10 mg Oral Daily  . ferrous sulfate  325 mg Oral BID WC  . fluticasone furoate-vilanterol  1 puff Inhalation Daily  . heparin  5,000 Units Subcutaneous Q8H  . lacosamide  100 mg Oral BID  . multivitamin-lutein  1 capsule Oral Daily  . RESOURCE THICKENUP CLEAR  1 g Oral TID PC  . umeclidinium bromide  1 puff Inhalation Daily  . vitamin B-12  1,000 mcg Oral Daily   Continuous Infusions: . sodium chloride 75 mL/hr at 08/17/18 1451  . cefTRIAXone (ROCEPHIN)  IV    . lacosamide (VIMPAT) IV 100 mg (08/17/18 1454)    Assessment/Plan:  1. Breakthrough seizures.  Neurology discontinued Keppra and started on Vimpat.  EEG ordered by neurology 2. Acute kidney injury on chronic kidney disease stage III given IV fluids 3. Acute cystitis with hematuria added on urine culture.  On Rocephin 4. History of stroke and TIA on aspirin 5. Accelerated hypertension on amlodipine 6. Parkinson's disease on Sinemet 7. Dementia  on Aricept  Code Status:     Code Status Orders  (From admission, onward)         Start     Ordered   08/17/18 0856  Do not attempt resuscitation (DNR)  Continuous    Question Answer Comment  In the event of cardiac or respiratory ARREST Do not call a "code blue"   In the event of cardiac or respiratory ARREST Do not perform Intubation, CPR, defibrillation or ACLS   In the event of cardiac or respiratory ARREST Use medication by any route, position, wound care, and other measures to relive pain and suffering. May  use oxygen, suction and manual treatment of airway obstruction as needed for comfort.      08/17/18 0856        Code Status History    Date Active Date Inactive Code Status Order ID Comments User Context   08/13/2018 0318 08/16/2018 1650 DNR [id number]  John Giovanni, MD Inpatient   08/13/2018 0318 08/13/2018 0318 DNR [id number]  John Giovanni, MD Inpatient   02/26/2017 2325 03/09/2017 1922 DNR [id number]  Katharina Caper, MD Inpatient   07/05/2016 1747 07/09/2016 1951 Full Code [id number]  Marguarite Arbour, MD ED   07/03/2016 1104 07/03/2016 1726 Full Code [id number]  Milagros Loll, MD ED   12/31/2015 1742 01/06/2016 1912 Full Code [id number]  [name], MD Inpatient   09/13/2015 2255 09/15/2015 2025 Full Code [id number]  Oralia Manis, MD Inpatient   08/28/2015 0814 08/29/2015 1649 DNR [id number]  Delfino Lovett, MD Inpatient   08/27/2015 2325 08/28/2015 0814 Full Code [id number]  Hower, Cletis Athens, MD ED     Family Communication: Left message for son Disposition Plan: To be determined based on clinical course  Consultants:  Neurology  Antibiotics: -Rocephin  Time spent: 28 minutes  Emaya Preston [employer]

## 2018-08-17 NOTE — Consult Note (Addendum)
Reason for Consult: Seizures Referring Physician: Alford Highland  CC: Seizure-like activity  HPI: Stacey Baker is an 84 y.o. female with multiple medical problems including chronic kidney disease, dementia without behavioral disturbances, depression, hyperlipidemia, hypertension, iron deficiency anemia, Parkinson's disease on carbidopa levodopa, CVA, seizure on Keppra, recurrent UTI, and C. difficile presenting to the ED from [facility] with witnessed seizure-like episode.  Patient is confused with no family members at bedside therefore history mostly obtained from patient's chart.  Per ED reports, patient was observed with shaking and jerking movements of extremity by staff, she was also forming at the mouth followed by post ictal phase.  She also had another seizure-like episode in the ED followed by post ictal phase.  There was no reports of injury, tongue biting or loss of bladder control.  She says she was loaded with Keppra 250 mg once.  Initial CT head did not show acute intracranial abnormality.  Chest x-ray normal with out acute infectious process noted.  Labs revealed normal white count, normal ammonia level, decreased kidney function from baseline, UA positive for urinary tract infection, cultures currently pending and lactic acid of 1.0.  Patient she was recently seen in the ED on 08/12/2018 with right-sided facial droop, garbled speech and right-sided weakness. She had a work-up including; CT head which was unremarkable, MRI/MRA head and neck which also did not show acute intracranial abnormality.  EEG of the brain which was abnormal due to mild to moderate generalized cerebral dysfunction otherwise no epileptiform discharges were seen.  She has had similar episodes in the past and was found to have acute UTI and C. difficile which was treated with metronidazole.  Patient is being followed by outpatient neurologist Dr. Malvin Johns who has her on Keppra 250 mg twice  daily.  Past Medical History:  Diagnosis Date  . Apraxia following cerebral infarction   . CKD (chronic kidney disease), stage III (HCC)    a. With acute worsening/AKI noted on several admissions.  . Dementia (HCC)   . Dementia without behavioral disturbance (HCC)   . Depression   . Encephalopathy   . Hyperkalemia    a. 08/2015  . Hyperlipidemia   . Hyperlipidemia   . Hypertension   . Iron deficiency anemia   . Major depressive disorder   . Muscle weakness   . Parkinson disease (HCC)   . Parkinson's disease (HCC)   . Pericardial effusion    a. 06/2016 Echo: Ef 60-65%, no rwma, Gr1 DD, mild MR, mildly dil LA, PASP , mod circumferential pericardial effusion - no hemodynamic compromise.  . Sinus bradycardia    a. 08/2015 in setting of beta blocker and hyperkalemia.  . Stroke (HCC)   . TIA (transient ischemic attack)    a. 08/2015 - essentially neg w/u for stroke.  Marland Kitchen UTI (urinary tract infection)     Past Surgical History:  Procedure Laterality Date  . ABDOMINAL HYSTERECTOMY    . APPENDECTOMY    . CHOLECYSTECTOMY      Family History  Problem Relation Age of Onset  . Heart disease Mother   . Hypertension Other     Social History:  reports that she has never smoked. She has never used smokeless tobacco. She reports that she does not drink alcohol or use drugs.  Allergies  Allergen Reactions  . Penicillins Other (See Comments)    "Allergic," per [facility] Has patient had a PCN reaction causing immediate rash, facial/tongue/throat swelling, SOB or lightheadedness with hypotension: Unk Has  patient had a PCN reaction causing severe rash involving mucus membranes or skin necrosis: Unk Has patient had a PCN reaction that required hospitalization: Unk Has patient had a PCN reaction occurring within the last 10 years: Unk If all of the above answers are "NO", then may proceed with Cephalosporin use.   Marland Kitchen Plavix [Clopidogrel Bisulfate] Other (See Comments)    "Allergic," per  [facility]    Medications: I have reviewed the patient's current medications. Prior to Admission:  Prior to Admission medications   Medication Sig Start Date End Date Taking? Authorizing Provider  acetaminophen (TYLENOL) 325 MG tablet Take 650 mg by mouth every 4 (four) hours as needed.    Yes [provider]  amLODipine (NORVASC) 10 MG tablet Take 10 mg by mouth daily. 08/02/18  Yes [provider]  aspirin EC 325 MG tablet Take 325 mg by mouth daily.    Yes [provider]  BREO ELLIPTA 100-25 MCG/INH AEPB Inhale 1 puff into the lungs daily. 07/27/18  Yes [provider]  calcitRIOL (ROCALTROL) 0.25 MCG capsule Take 0.25 mcg by mouth daily.  06/08/17  Yes [provider]  carbidopa-levodopa (SINEMET IR) 25-100 MG tablet Take 1 tablet by mouth 3 (three) times daily.   Yes [provider]  donepezil (ARICEPT) 10 MG tablet Take 10 mg by mouth at bedtime.  12/22/16  Yes [provider]  escitalopram (LEXAPRO) 10 MG tablet Take 10 mg by mouth daily.   Yes [provider]  ferrous sulfate 325 (65 FE) MG tablet Take 325 mg by mouth 2 (two) times daily with a meal.    Yes [provider]  levETIRAcetam (KEPPRA) 250 MG tablet Take 1 tablet (250 mg total) by mouth 2 (two) times daily. 08/14/18  Yes Kathlen Mody, MD  loperamide (IMODIUM) 2 MG capsule Take by mouth as needed for diarrhea or loose stools.   Yes [provider]  LORazepam (ATIVAN) 0.5 MG tablet Take 0.5 mg by mouth every 4 (four) hours as needed for anxiety.   Yes [provider]  Maltodextrin-Xanthan Gum (RESOURCE THICKENUP CLEAR) POWD WITH EVERY MEAL 08/14/18  Yes Kathlen Mody, MD  morphine (ROXANOL) 20 MG/ML concentrated solution Take 0.25 mg by mouth every hour as needed for severe pain.    Yes [provider]  Multiple Vitamins-Minerals (PRESERVISION AREDS 2 PO) Take 2 capsules by mouth daily.   Yes [provider]  Polyethylene  Glycol 3350 (MIRALAX PO) Take 17 g by mouth as needed (Constipation).    Yes [provider]  Probiotic Product (PROBIOTIC DAILY) CAPS Take 1 capsule by mouth 2 (two) times daily.    Yes [provider]  QUEtiapine (SEROQUEL) 25 MG tablet Take 25 mg by mouth at bedtime.   Yes [provider]  umeclidinium bromide (INCRUSE ELLIPTA) 62.5 MCG/INH AEPB Inhale 1 puff into the lungs daily. 02/04/18  Yes [provider]  vitamin B-12 (CYANOCOBALAMIN) 1000 MCG tablet Take 1,000 mcg by mouth daily.   Yes [provider]  miconazole (BAZA ANTIFUNGAL) 2 % cream Apply 1 application topically 2 (two) times daily as needed. To sacrum every shift for redness after each toilet    [provider]  Soft Lens Products (EQ SALINE SOLUTION/SENSITIVE) SOLN Apply 1 application topically every other day. Apply to neck topically once every other day for wound until healed.    [provider]    ROS: Unable to obtain due to current altered mental status and confusion at  baseline Physical Examination: Blood pressure (!) 189/69, pulse 65, temperature 97.9 F (36.6 C), temperature source Axillary, resp. rate 13, SpO2 97 %.  HEENT-  Normocephalic, no lesions, without obvious abnormality.  Normal external eye and conjunctiva.  Normal TM's bilaterally.  Normal auditory canals and external ears. Normal external nose, mucus membranes and septum.  Normal pharynx. Cardiovascular- S1, S2 normal, pulses palpable throughout   Lungs- chest clear, no wheezing, rales, normal symmetric air entry Abdomen- soft, non-tender; bowel sounds normal; no masses,  no organomegaly Extremities- no edema Lymph-no adenopathy palpable Musculoskeletal-no joint tenderness, deformity or swelling Skin-warm and dry, no hyperpigmentation, vitiligo, or suspicious lesions  Neurological Exam   Mental Status: Alert, oriented to self only.  Disoriented to place, time and situation. Speech  unintelligible. Requires extensive reinforcement to follow smple commands.   Cranial Nerves: II: Discs flat bilaterally; blinks to threat bilaterally, pupils equal, round, reactive to light and accommodation III,IV, VI: ptosis not present, extra-ocular motions intact bilaterally V,VII: smile symmetric, facial light touch sensation intact VIII: hearing normal bilaterally IX,X: gag reflex present XI: Unable to assess due to difficulty with comprehension. XII: midline tongue extension Motor: Right :  Upper extremity   4+/5 with drift      Left: Upper extremity   5/5 without pronator drift Right:   Lower extremity   4/5                                          Left: Lower extremity   4/5 Tone and bulk:normal tone throughout; no atrophy noted Sensory: Pinprick and light touch intact bilaterally Deep Tendon Reflexes: 2+ and symmetric throughout Plantars: Right: downgoing                             Left: downgoing Cerebellar: Unable to assess due to difficulty with comprehension Gait: not tested due to safety concerns  Data Reviewed  Laboratory Studies:   Basic Metabolic Panel: Recent Labs  Lab 08/12/18 2043 08/12/18 2056 08/13/18 0420 08/14/18 0932 08/16/18 2052  NA 142 141 142 141 142  K 4.5 4.6 3.9 4.4 4.4  CL 108 107 108 112* 109  CO2 24  --  29 19* 24  GLUCOSE 114* 114* 99 103* 124*  BUN 32* 34* 29* 23 30*  CREATININE 2.03* 2.20* 1.82* 1.53* 1.89*  CALCIUM 9.1  --  8.7* 8.5* 8.2*    Liver Function Tests: Recent Labs  Lab 08/12/18 2043 08/13/18 0420 08/16/18 2052  AST 11* 10* 18  ALT <5 <5 <5  ALKPHOS 71 63 99  BILITOT 0.5 0.6 0.5  PROT 6.5 5.5* 6.3*  ALBUMIN 3.6 3.0* 3.3*   No results for input(s): LIPASE, AMYLASE in the last 168 hours. Recent Labs  Lab 08/13/18 0420 08/16/18 2052  AMMONIA 9 26    CBC: Recent Labs  Lab 08/12/18 2043 08/12/18 2056 08/13/18 0420 08/16/18 2313  WBC 4.7  --  4.1 5.2  NEUTROABS 2.5  --   --  3.6  HGB 10.5* 10.5* 9.8*  9.3*  HCT 33.8* 31.0* 31.7* 29.5*  MCV 94.4  --  93.8 96.1  PLT 126*  --  115* 95*    Cardiac Enzymes: Recent Labs  Lab 08/16/18 2052  TROPONINI <0.03    BNP: Invalid input(s): POCBNP  CBG: Recent Labs  Lab 08/12/18 2042  GLUCAP 106*  Microbiology: Results for orders placed or performed during the hospital encounter of 08/12/18  MRSA PCR Screening     Status: None   Collection Time: 08/13/18  3:14 AM  Result Value Ref Range Status   MRSA by PCR NEGATIVE NEGATIVE Final    Comment:        The GeneXpert MRSA Assay (FDA approved for NASAL specimens only), is one component of a comprehensive MRSA colonization surveillance program. It is not intended to diagnose MRSA infection nor to guide or monitor treatment for MRSA infections. Performed at [facility], [address], [place], [place] [address]     Coagulation Studies: No results for input(s): LABPROT, INR in the last 72 hours.  Urinalysis:  Recent Labs  Lab 08/12/18 2305 08/16/18 2052  COLORURINE YELLOW YELLOW*  LABSPEC 1.013 1.020  PHURINE 6.0 5.0  GLUCOSEU NEGATIVE NEGATIVE  HGBUR NEGATIVE SMALL*  BILIRUBINUR NEGATIVE NEGATIVE  KETONESUR NEGATIVE 5*  PROTEINUR 30* 100*  NITRITE NEGATIVE NEGATIVE  LEUKOCYTESUR NEGATIVE LARGE*    Lipid Panel:     Component Value Date/Time   CHOL 130 08/28/2015 1124   CHOL 157 05/04/2014 0453   TRIG 37 08/28/2015 1124   TRIG 52 05/04/2014 0453   HDL 61 08/28/2015 1124   HDL 79 (H) 05/04/2014 0453   CHOLHDL 2.1 08/28/2015 1124   VLDL 7 08/28/2015 1124   VLDL 10 05/04/2014 0453   LDLCALC 62 08/28/2015 1124   LDLCALC 68 05/04/2014 0453    HgbA1C:  Lab Results  Component Value Date   HGBA1C 5.1 08/28/2015    Urine Drug Screen:      Component Value Date/Time   LABOPIA NONE DETECTED 07/03/2016 1604   COCAINSCRNUR NONE DETECTED 07/03/2016 1604   LABBENZ NONE DETECTED 07/03/2016 1604   AMPHETMU NONE DETECTED 07/03/2016 1604   THCU NONE  DETECTED 07/03/2016 1604   LABBARB NONE DETECTED 07/03/2016 1604    Alcohol Level: No results for input(s): ETH in the last 168 hours.  Other results: EKG: normal EKG, normal sinus rhythm, unchanged from previous tracings. Vent. rate 52 BPM PR interval * ms QRS duration 100 ms QT/QTc 438/408 ms P-R-T axes 80 63 66  Imaging: Ct Head Wo Contrast  Result Date: 08/16/2018 CLINICAL DATA:  Altered mental status. EXAM: CT HEAD WITHOUT CONTRAST TECHNIQUE: Contiguous axial images were obtained from the base of the skull through the vertex without intravenous contrast. COMPARISON:  08/12/2018 and 12/08/2017 FINDINGS: Brain: Ventricles and cisterns as well as CSF spaces are mildly prominent and unchanged compatible with age related atrophy. There is mild chronic ischemic microvascular disease. There is no mass, mass effect, shift of midline structures or acute hemorrhage. No evidence of acute infarction. Vascular: No hyperdense vessel or unexpected calcification. Skull: Normal. Negative for fracture or focal lesion. Sinuses/Orbits: No acute finding. Other: None. IMPRESSION: No acute findings. Minimal chronic ischemic microvascular disease and stable age related atrophic change. Electronically Signed   By: Elberta Fortis M.D.   On: 08/16/2018 20:50    Patient seen and examined.  Clinical course and management discussed.  Necessary edits performed.  I agree with the above.  Assessment and plan of care developed and discussed below.     Assessment/Plan: 84 year old female presenting with altered mental status and seizures.  Patient with a history of seizures, maintained on Keppra.  Noted to have breakthrough seizures in the past with infection.  Head CT reviewed and shows no acute changes.  Currently with likely UTI.  Has had  side effects on Keppra in the past at higher doses.    Plan 1. EEG.  Unclear what the patient is like at baseline but currently quite confused.  Will rule out non-convulsive status.    2. Vimpat 100mg  IV now with maintenance of 100mg  q 12 hours po 3. Discontinue Keppra 4. Seizure precautions 5. Ativan prn seizure activity 6.  Agree with current medical management for underlying infectious process.   This patient was staffed with Dr. Verlon Au, Thad Ranger who personally evaluated patient, reviewed documentation and agreed with assessment and plan of care as above.  Webb Silversmith, DNP, FNP-BC Board certified Nurse Practitioner Neurology Department  08/17/2018, 11:53 AM   Thana Farr, MD Neurology [phone]  08/17/2018  1:35 PM

## 2018-08-17 NOTE — Progress Notes (Signed)
Visit made. Patient is currently followed by [facility] with a hospice diagnosis of Hypertensive Kidney disease. She is a DNR  Code with out of facility DNR in place. She was sent to the [facility] ED on 10/28 via EMS for evaluation of "seizure like activity". Hospice was not notified prior to patient being sent out. In the ED she was found to have a UTI and was admitted for treatment as well as neurology consult. Patient seen sitting up in bed, alert, some clear words at times during visit. Staff RN Danielle present during visit feeding patient. Patient ate well, drank tea with a straw, no difficulty swallowing. Patient does need to have limited distractions during meal. She is currently being treated for a UTI receiving IV antibiotics and IV fluids. No family at bedside. Warm blankets applied as patient reported bing cold. Will continue to follow and update hospice team. Transfer form in place in shadow chart. DNR not sent with patient. Dayna Barker RN,BSN, [facility], hospital Liaison [phone]

## 2018-08-18 LAB — GLUCOSE, CAPILLARY: GLUCOSE-CAPILLARY: 111 mg/dL — AB (ref 70–99)

## 2018-08-18 NOTE — Plan of Care (Signed)
  Problem: Education: Goal: Knowledge of General Education information will improve Description Including pain rating scale, medication(s)/side effects and non-pharmacologic comfort measures Outcome: Progressing   Problem: Health Behavior/Discharge Planning: Goal: Ability to manage health-related needs will improve Outcome: Progressing   Problem: Clinical Measurements: Goal: Ability to maintain clinical measurements within normal limits will improve Outcome: Progressing   Problem: Education: Goal: Expressions of having a comfortable level of knowledge regarding the disease process will increase Outcome: Progressing   Problem: Coping: Goal: Ability to adjust to condition or change in health will improve Outcome: Progressing   Problem: Problem: Skin/Wound Progression Goal: Adequate nutrition will be maintained Outcome: Not Progressing

## 2018-08-18 NOTE — Progress Notes (Signed)
PT Cancellation Note  Patient Details Name: Stacey Baker MRN: [id number] DOB: Apr 25, 1932   Cancelled Treatment:     Pt order received and chart reviewed. Pt resting in bed on arrival. Initially opens eyes and responses to questions appropriately, with garbled speech. Pt has difficulty remaining alert with verbal cueing, could not keep eyes open or follow one-step commands. Will attempt PT evaluation at later date when pt is safe and appropriate.   Arvilla Meres, SPT  08/18/2018, 2:32 PM

## 2018-08-18 NOTE — Progress Notes (Signed)
[name] at bedside, asked him if he knew what patient's diet is at [facility].  He stated that she just ate a regular diet.  I asked about thickened liquids and he stated that he didn't think so.  Patient has tolerated thin liquids this shift with no s/s of aspiration.  Ate great for lunch but not for dinner.  [name] stated that she has a very poor intake.  Orson Ape, RN, BSN

## 2018-08-18 NOTE — Progress Notes (Addendum)
Subjective: Patient awake this morning but remains confused. Does not appear agitated, restless or to be in distress.   Objective: Current vital signs: BP (!) 162/72   Pulse (!) 53   Temp (!) 97.3 F (36.3 C) (Axillary)   Resp 20   Wt 58.4 kg   SpO2 97%   BMI 23.55 kg/m  Vital signs in last 24 hours: Temp:  [96.4 F (35.8 C)-97.9 F (36.6 C)] 97.3 F (36.3 C) (10/30 0402) Pulse Rate:  [52-65] 53 (10/30 0517) Resp:  [10-20] 20 (10/29 1307) BP: (162-206)/(60-78) 162/72 (10/30 0517) SpO2:  [96 %-98 %] 97 % (10/30 0402) Weight:  [58.4 kg] 58.4 kg (10/30 0413)  Intake/Output from previous day: 10/29 0701 - 10/30 0700 In: 819.4 [I.V.:684.4; IV Piggyback:135] Out: 550 [Urine:550] Intake/Output this shift: No intake/output data recorded. Nutritional status:  Diet Order            Diet Heart Room service appropriate? Yes; Fluid consistency: Thin  Diet effective now              Neurologic Exam: Mental Status: Alert, oriented to self and place. Disoriented to time and situation. Speech mumbled without evidence of aphasia. Able to follow 2 step simple commands without difficulty.  Cranial Nerves: II: Discs flat bilaterally; blinks to threat bilaterally, pupils equal, round, reactive to light and accommodation III,IV, VI: ptosis not present, extra-ocular motions intact bilaterally V,VII: smile symmetric, facial light touch sensationintact VIII: hearing normal bilaterally IX,X: gag reflex present XI: Unable to assess due to difficulty with comprehension. XII: midline tongue extension Motor: Break gravity with purposeful movement in bilateral upper and lower extremities, however cannot fully assess strength due to difficulty with comprehension. Tone and bulk:normal tone throughout; no atrophy noted Sensory: Pinprick and light touchintact bilaterally Deep Tendon Reflexes: 2+ and symmetric throughout Plantars: Right:downgoingLeft:  downgoing Cerebellar: Unable to assess due to difficulty with comprehension Gait: not tested due to safety concerns  Lab Results: Basic Metabolic Panel: Recent Labs  Lab 08/12/18 2043 08/12/18 2056 08/13/18 0420 08/14/18 0932 08/16/18 2052 08/17/18 1329  NA 142 141 142 141 142 142  K 4.5 4.6 3.9 4.4 4.4 4.1  CL 108 107 108 112* 109 107  CO2 24  --  29 19* 24 26  GLUCOSE 114* 114* 99 103* 124* 101*  BUN 32* 34* 29* 23 30* 26*  CREATININE 2.03* 2.20* 1.82* 1.53* 1.89* 1.41*  CALCIUM 9.1  --  8.7* 8.5* 8.2* 8.7*    Liver Function Tests: Recent Labs  Lab 08/12/18 2043 08/13/18 0420 08/16/18 2052  AST 11* 10* 18  ALT <5 <5 <5  ALKPHOS 71 63 99  BILITOT 0.5 0.6 0.5  PROT 6.5 5.5* 6.3*  ALBUMIN 3.6 3.0* 3.3*   No results for input(s): LIPASE, AMYLASE in the last 168 hours. Recent Labs  Lab 08/13/18 0420 08/16/18 2052  AMMONIA 9 26    CBC: Recent Labs  Lab 08/12/18 2043 08/12/18 2056 08/13/18 0420 08/16/18 2313 08/17/18 1956  WBC 4.7  --  4.1 5.2 6.0  NEUTROABS 2.5  --   --  3.6  --   HGB 10.5* 10.5* 9.8* 9.3* 11.0*  HCT 33.8* 31.0* 31.7* 29.5* 34.2*  MCV 94.4  --  93.8 96.1 93.4  PLT 126*  --  115* 95* 122*    Cardiac Enzymes: Recent Labs  Lab 08/16/18 2052  TROPONINI <0.03    Lipid Panel: No results for input(s): CHOL, TRIG, HDL, CHOLHDL, VLDL, LDLCALC in the last 168 hours.  CBG: Recent Labs  Lab 08/12/18 2042  GLUCAP 106*    Microbiology: Results for orders placed or performed during the hospital encounter of 08/16/18  MRSA PCR Screening     Status: None   Collection Time: 08/17/18  1:14 PM  Result Value Ref Range Status   MRSA by PCR NEGATIVE NEGATIVE Final    Comment:        The GeneXpert MRSA Assay (FDA approved for NASAL specimens only), is one component of a comprehensive MRSA colonization surveillance program. It is not intended to diagnose MRSA infection nor to guide or monitor treatment for MRSA infections. Performed  at [facility], [address] [place], [place] [address]     Coagulation Studies: No results for input(s): LABPROT, INR in the last 72 hours.  Imaging: Ct Head Wo Contrast  Result Date: 08/16/2018 CLINICAL DATA:  Altered mental status. EXAM: CT HEAD WITHOUT CONTRAST TECHNIQUE: Contiguous axial images were obtained from the base of the skull through the vertex without intravenous contrast. COMPARISON:  08/12/2018 and 12/08/2017 FINDINGS: Brain: Ventricles and cisterns as well as CSF spaces are mildly prominent and unchanged compatible with age related atrophy. There is mild chronic ischemic microvascular disease. There is no mass, mass effect, shift of midline structures or acute hemorrhage. No evidence of acute infarction. Vascular: No hyperdense vessel or unexpected calcification. Skull: Normal. Negative for fracture or focal lesion. Sinuses/Orbits: No acute finding. Other: None. IMPRESSION: No acute findings. Minimal chronic ischemic microvascular disease and stable age related atrophic change. Electronically Signed   By: Elberta Fortis M.D.   On: 08/16/2018 20:50    Medications:  I have reviewed the patient's current medications. Prior to Admission:  Medications Prior to Admission  Medication Sig Dispense Refill Last Dose  . acetaminophen (TYLENOL) 325 MG tablet Take 650 mg by mouth every 4 (four) hours as needed.    prn at prn  . amLODipine (NORVASC) 10 MG tablet Take 10 mg by mouth daily.   08/16/2018 at 0900  . aspirin EC 325 MG tablet Take 325 mg by mouth daily.    08/16/2018 at 0900  . BREO ELLIPTA 100-25 MCG/INH AEPB Inhale 1 puff into the lungs daily.   08/16/2018 at 0900  . calcitRIOL (ROCALTROL) 0.25 MCG capsule Take 0.25 mcg by mouth daily.    08/16/2018 at 0900  . carbidopa-levodopa (SINEMET IR) 25-100 MG tablet Take 1 tablet by mouth 3 (three) times daily.   08/16/2018 at 2000  . donepezil (ARICEPT) 10 MG tablet Take 10 mg by mouth at bedtime.    08/16/2018 at 2000   . escitalopram (LEXAPRO) 10 MG tablet Take 10 mg by mouth daily.   08/16/2018 at 0900  . ferrous sulfate 325 (65 FE) MG tablet Take 325 mg by mouth 2 (two) times daily with a meal.    08/16/2018 at 2000  . levETIRAcetam (KEPPRA) 250 MG tablet Take 1 tablet (250 mg total) by mouth 2 (two) times daily. 60 tablet 0 08/16/2018 at 0900  . loperamide (IMODIUM) 2 MG capsule Take by mouth as needed for diarrhea or loose stools.   prn at prn  . LORazepam (ATIVAN) 0.5 MG tablet Take 0.5 mg by mouth every 4 (four) hours as needed for anxiety.   prn at prn  . Maltodextrin-Xanthan Gum (RESOURCE THICKENUP CLEAR) POWD WITH EVERY MEAL   08/16/2018 at Unknown time  . morphine (ROXANOL) 20 MG/ML concentrated solution Take 0.25 mg by mouth every hour as needed for severe pain.  prn at prn  . Multiple Vitamins-Minerals (PRESERVISION AREDS 2 PO) Take 2 capsules by mouth daily.   08/16/2018 at 0900  . Polyethylene Glycol 3350 (MIRALAX PO) Take 17 g by mouth as needed (Constipation).    prn at prn  . Probiotic Product (PROBIOTIC DAILY) CAPS Take 1 capsule by mouth 2 (two) times daily.    08/16/2018 at 2000  . QUEtiapine (SEROQUEL) 25 MG tablet Take 25 mg by mouth at bedtime.   08/16/2018 at 2000  . umeclidinium bromide (INCRUSE ELLIPTA) 62.5 MCG/INH AEPB Inhale 1 puff into the lungs daily.   08/16/2018 at 0900  . vitamin B-12 (CYANOCOBALAMIN) 1000 MCG tablet Take 1,000 mcg by mouth daily.   Past Month at Unknown time  . miconazole (BAZA ANTIFUNGAL) 2 % cream Apply 1 application topically 2 (two) times daily as needed. To sacrum every shift for redness after each toilet   Not Taking at Unknown time  . Soft Lens Products (EQ SALINE SOLUTION/SENSITIVE) SOLN Apply 1 application topically every other day. Apply to neck topically once every other day for wound until healed.   Not Taking at Unknown time   Scheduled: . acidophilus  1 capsule Oral BID  . amLODipine  10 mg Oral Daily  . aspirin EC  325 mg Oral Daily  .  carbidopa-levodopa  1 tablet Oral TID  . docusate sodium  100 mg Oral BID  . donepezil  10 mg Oral QHS  . escitalopram  10 mg Oral Daily  . ferrous sulfate  325 mg Oral BID WC  . fluticasone furoate-vilanterol  1 puff Inhalation Daily  . heparin  5,000 Units Subcutaneous Q8H  . hydrALAZINE  10 mg Oral Q8H  . lacosamide  100 mg Oral BID  . multivitamin-lutein  1 capsule Oral Daily  . RESOURCE THICKENUP CLEAR  1 g Oral TID PC  . umeclidinium bromide  1 puff Inhalation Daily  . vitamin B-12  1,000 mcg Oral Daily   Patient seen and examined.  Clinical course and management discussed.  Necessary edits performed.  I agree with the above.  Assessment and plan of care developed and discussed below.    Assessment/Plan: 84 year old female presenting with altered mental status and seizures in the setting of infectious process. Keppra discontinued due to inability to tolerate higher doses and patient started on Vimpat. No new seizure like activity reported thus far.  Plan 1. Continue Vimpat at 100mg  BID 2. Seizure precautions 3. Ativan prn seizure activity 4. Agree with current medical management for underlying infectious process.  This patient was staffed with Dr. Verlon Au, Thad Ranger who personally evaluated patient, reviewed documentation and agreed with assessment and plan of care as above.  Webb Silversmith, DNP, FNP-BC Board certified Nurse Practitioner Neurology Department   LOS: 2 days   08/18/2018  8:25 AM  Thana Farr, MD Neurology [phone]  08/18/2018  12:51 PM

## 2018-08-18 NOTE — Progress Notes (Signed)
Patient ID: Stacey Baker, female   DOB: 09/12/1932, 84 y.o.   MRN: [id number]  [employer] PROGRESS NOTE  Stacey Baker WUJ:[id number] DOB: February 03, 1932 DOA: 08/16/2018 PCP: Merlene Pulling, PA-C  HPI/Subjective: Patient slower with her answers today.  Son states she gets this way if she gets a urinary tract infection.  Normally she is able to answer questions and talk louder.  She is normally able to move around a little bit.  Objective: Vitals:   08/18/18 0517 08/18/18 0905  BP: (!) 162/72 (!) 190/74  Pulse: (!) 53 (!) 54  Resp:  18  Temp:  97.6 F (36.4 C)  SpO2:  97%    Filed Weights   08/18/18 0413  Weight: 58.4 kg    ROS: Review of Systems  Unable to perform ROS: Mental status change   Exam: Physical Exam  HENT:  Nose: No mucosal edema.  Mouth/Throat: No oropharyngeal exudate or posterior oropharyngeal edema.  Eyes: Pupils are equal, round, and reactive to light. Conjunctivae, EOM and lids are normal.  Neck: No JVD present. Carotid bruit is not present. No edema present. No thyroid mass and no thyromegaly present.  Cardiovascular: S1 normal and S2 normal. Exam reveals no gallop.  No murmur heard. Pulses:      Dorsalis pedis pulses are 2+ on the right side, and 2+ on the left side.  Respiratory: No respiratory distress. She has decreased breath sounds in the right lower field and the left lower field. She has no wheezes. She has no rhonchi. She has no rales.  GI: Soft. Bowel sounds are normal. There is no tenderness.  Musculoskeletal:       Right shoulder: She exhibits no swelling.       Right ankle: She exhibits no swelling.       Left ankle: She exhibits no swelling.  Lymphadenopathy:    She has no cervical adenopathy.  Neurological: She is alert. No cranial nerve deficit.  Able to straight leg raise  Skin: Skin is warm. Nails show no clubbing.  Bruising upper and lower extremities  Psychiatric:  Answer some yes or no questions.  Very difficult to  understand.      Data Reviewed: Basic Metabolic Panel: Recent Labs  Lab 08/12/18 2043 08/12/18 2056 08/13/18 0420 08/14/18 0932 08/16/18 2052 08/17/18 1329  NA 142 141 142 141 142 142  K 4.5 4.6 3.9 4.4 4.4 4.1  CL 108 107 108 112* 109 107  CO2 24  --  29 19* 24 26  GLUCOSE 114* 114* 99 103* 124* 101*  BUN 32* 34* 29* 23 30* 26*  CREATININE 2.03* 2.20* 1.82* 1.53* 1.89* 1.41*  CALCIUM 9.1  --  8.7* 8.5* 8.2* 8.7*   Liver Function Tests: Recent Labs  Lab 08/12/18 2043 08/13/18 0420 08/16/18 2052  AST 11* 10* 18  ALT <5 <5 <5  ALKPHOS 71 63 99  BILITOT 0.5 0.6 0.5  PROT 6.5 5.5* 6.3*  ALBUMIN 3.6 3.0* 3.3*    Recent Labs  Lab 08/13/18 0420 08/16/18 2052  AMMONIA 9 26   CBC: Recent Labs  Lab 08/12/18 2043 08/12/18 2056 08/13/18 0420 08/16/18 2313 08/17/18 1956  WBC 4.7  --  4.1 5.2 6.0  NEUTROABS 2.5  --   --  3.6  --   HGB 10.5* 10.5* 9.8* 9.3* 11.0*  HCT 33.8* 31.0* 31.7* 29.5* 34.2*  MCV 94.4  --  93.8 96.1 93.4  PLT 126*  --  115* 95* 122*   Cardiac Enzymes: Recent  Labs  Lab 08/16/18 2052  TROPONINI <0.03    CBG: Recent Labs  Lab 08/12/18 2042 08/18/18 1034  GLUCAP 106* 111*    Recent Results (from the past 240 hour(s))  MRSA PCR Screening     Status: None   Collection Time: 08/13/18  3:14 AM  Result Value Ref Range Status   MRSA by PCR NEGATIVE NEGATIVE Final    Comment:        The GeneXpert MRSA Assay (FDA approved for NASAL specimens only), is one component of a comprehensive MRSA colonization surveillance program. It is not intended to diagnose MRSA infection nor to guide or monitor treatment for MRSA infections. Performed at [facility], [address], [place], [place] [address]   Urine Culture     Status: Abnormal (Preliminary result)   Collection Time: 08/16/18  8:52 PM  Result Value Ref Range Status   Specimen Description   Final    URINE, CATHETERIZED Performed at [facility], [address] [place], [place] [address]    Special Requests   Final    Normal Performed at [facility], [address] [place], [place] [address]    Culture >=100,000 COLONIES/mL GRAM NEGATIVE RODS (A)  Final   Report Status PENDING  Incomplete  MRSA PCR Screening     Status: None   Collection Time: 08/17/18  1:14 PM  Result Value Ref Range Status   MRSA by PCR NEGATIVE NEGATIVE Final    Comment:        The GeneXpert MRSA Assay (FDA approved for NASAL specimens only), is one component of a comprehensive MRSA colonization surveillance program. It is not intended to diagnose MRSA infection nor to guide or monitor treatment for MRSA infections. Performed at [facility], [address], [place], [place] [address]      Studies: Ct Head Wo Contrast  Result Date: 08/16/2018 CLINICAL DATA:  Altered mental status. EXAM: CT HEAD WITHOUT CONTRAST TECHNIQUE: Contiguous axial images were obtained from the base of the skull through the vertex without intravenous contrast. COMPARISON:  08/12/2018 and 12/08/2017 FINDINGS: Brain: Ventricles and cisterns as well as CSF spaces are mildly prominent and unchanged compatible with age related atrophy. There is mild chronic ischemic microvascular disease. There is no mass, mass effect, shift of midline structures or acute hemorrhage. No evidence of acute infarction. Vascular: No hyperdense vessel or unexpected calcification. Skull: Normal. Negative for fracture or focal lesion. Sinuses/Orbits: No acute finding. Other: None. IMPRESSION: No acute findings. Minimal chronic ischemic microvascular disease and stable age related atrophic change. Electronically Signed   By: Elberta Fortis M.D.   On: 08/16/2018 20:50    Scheduled Meds: . acidophilus  1 capsule Oral BID  . amLODipine  10 mg Oral Daily  . aspirin EC  325 mg Oral Daily  . carbidopa-levodopa  1 tablet Oral TID  . docusate sodium  100 mg Oral BID  . donepezil  10 mg Oral QHS  .  escitalopram  10 mg Oral Daily  . ferrous sulfate  325 mg Oral BID WC  . fluticasone furoate-vilanterol  1 puff Inhalation Daily  . heparin  5,000 Units Subcutaneous Q8H  . hydrALAZINE  10 mg Oral Q8H  . lacosamide  100 mg Oral BID  . multivitamin-lutein  1 capsule Oral Daily  . RESOURCE THICKENUP CLEAR  1 g Oral TID PC  . umeclidinium bromide  1 puff Inhalation Daily  . vitamin B-12  1,000 mcg Oral Daily  Continuous Infusions: . cefTRIAXone (ROCEPHIN)  IV Stopped (08/18/18 0008)    Assessment/Plan:  1. Breakthrough seizures.  Neurology discontinued Keppra and started on Vimpat.  As per neurology okay to go home on Vimpat 100 mg twice a day.  Patient may be a little slower today because she was loaded with Keppra or secondary to urinary tract infection 2. Acute cystitis with hematuria.  On Rocephin.  Urine culture growing 100,000 gram-negative rods.  Awaiting sensitivities. 3. Acute kidney injury on chronic kidney disease stage III.  Given IV fluids while here.  Will discontinue fluids today and monitor. 4. History of stroke and TIA on aspirin 5. Accelerated hypertension on amlodipine 6. Parkinson's disease on Sinemet 7. Dementia on Aricept 8. Weakness.  Physical therapy evaluation  Patient in assisted living facility followed by hospice.  Code Status:     Code Status Orders  (From admission, onward)         Start     Ordered   08/17/18 0856  Do not attempt resuscitation (DNR)  Continuous    Question Answer Comment  In the event of cardiac or respiratory ARREST Do not call a "code blue"   In the event of cardiac or respiratory ARREST Do not perform Intubation, CPR, defibrillation or ACLS   In the event of cardiac or respiratory ARREST Use medication by any route, position, wound care, and other measures to relive pain and suffering. May use oxygen, suction and manual treatment of airway obstruction as needed for comfort.      08/17/18 0856        Code Status History     Date Active Date Inactive Code Status Order ID Comments User Context   08/13/2018 0318 08/16/2018 1650 DNR [id number]  John Giovanni, MD Inpatient   08/13/2018 0318 08/13/2018 0318 DNR [id number]  John Giovanni, MD Inpatient   02/26/2017 2325 03/09/2017 1922 DNR [id number]  Katharina Caper, MD Inpatient   07/05/2016 1747 07/09/2016 1951 Full Code [id number]  Marguarite Arbour, MD ED   07/03/2016 1104 07/03/2016 1726 Full Code [id number]  Milagros Loll, MD ED   12/31/2015 1742 01/06/2016 1912 Full Code [id number]  [name], MD Inpatient   09/13/2015 2255 09/15/2015 2025 Full Code [id number]  Oralia Manis, MD Inpatient   08/28/2015 0814 08/29/2015 1649 DNR [id number]  Delfino Lovett, MD Inpatient   08/27/2015 2325 08/28/2015 0814 Full Code [id number]  Hower, Cletis Athens, MD ED     Family Communication: Spoke with son on the phone and then at the bedside Disposition Plan: Would like her mental status will be better prior to getting out of the hospital.  Consultants:  Neurology  Antibiotics: -Rocephin  Time spent: 28 minutes  Amie Cowens [employer]

## 2018-08-18 NOTE — Progress Notes (Signed)
Visit made. Patient seen lying in bed, eyes closed. Lunch tray at bedside, aide present to feed patient, she did not eat.. Per staff RN patient did eat a magic cup this morning and take her oral medications. She did not have any agitation or restlessness today. She continues on new anti seizure mediation Vimpat, Keppra discontinued by neurology. EEG completed yesterday, no new seizure activity noted/reported. She continues on IV antibiotics for treatment of a UTI. Will continue to follow  And update hospice team. Dayna Barker RN, BSN, [facility] [place], hospital Liaison [phone]

## 2018-08-19 LAB — URINE CULTURE: Special Requests: NORMAL

## 2018-08-19 LAB — GLUCOSE, CAPILLARY: Glucose-Capillary: 112 mg/dL — ABNORMAL HIGH (ref 70–99)

## 2018-08-19 MED ORDER — LACOSAMIDE 50 MG PO TABS: 50.0000 mg | ORAL_TABLET | Freq: Two times a day (BID) | ORAL | Status: DC

## 2018-08-19 MED ORDER — SODIUM CHLORIDE 0.9 % IV SOLN
50.0000 mg | Freq: Two times a day (BID) | INTRAVENOUS | Status: DC
  Administered 2018-08-19: 17:00:00 50 mg via INTRAVENOUS
  Filled 2018-08-19 (×3): qty 5

## 2018-08-19 MED ORDER — SODIUM CHLORIDE 0.45 % IV SOLN
INTRAVENOUS | Status: DC
  Administered 2018-08-19 – 2018-08-21 (×3): via INTRAVENOUS

## 2018-08-19 MED ORDER — NITROGLYCERIN 2 % TD OINT
0.5000 [in_us] | TOPICAL_OINTMENT | Freq: Four times a day (QID) | TRANSDERMAL | Status: DC
  Administered 2018-08-19 – 2018-08-22 (×12): 0.5 [in_us] via TOPICAL
  Filled 2018-08-19 (×14): qty 1

## 2018-08-19 NOTE — Progress Notes (Addendum)
Subjective: Patient asleep however was able to open eyes to verbal and tactile stimuli. Slow to response, intermittently following commands. Appears lethargic/drowsy today. No significant events reported overnight. No further episodes of seizures or seizure like activity reported.  Objective: Current vital signs: BP (!) 145/86   Pulse 70   Temp (!) 97.5 F (36.4 C) (Oral)   Resp 16   Wt 59.4 kg   SpO2 92%   BMI 23.96 kg/m  Vital signs in last 24 hours: Temp:  [97.3 F (36.3 C)-97.7 F (36.5 C)] 97.5 F (36.4 C) (10/31 0421) Pulse Rate:  [61-70] 70 (10/31 0606) Resp:  [16-18] 16 (10/31 0421) BP: (139-191)/(74-106) 145/86 (10/31 0606) SpO2:  [92 %-97 %] 92 % (10/31 0606) Weight:  [59.4 kg] 59.4 kg (10/31 0421)  Intake/Output from previous day: 10/30 0701 - 10/31 0700 In: 494.2 [I.V.:392; IV Piggyback:102.2] Out: 600 [Urine:600] Intake/Output this shift: No intake/output data recorded. Nutritional status:  Diet Order            DIET DYS 2 Room service appropriate? Yes; Fluid consistency: Thin  Diet effective now             Neurologic Exam: Mental Status: Alert, orientedto self only. Disoriented to time, place and situation.Speechmumbledwithout evidence of aphasia. Able to follow2step simplecommands without difficulty.  Cranial Nerves: II: Discs flat bilaterally;blinks to threat bilaterally, pupils equal, round, reactive to light and accommodation III,IV, VI: ptosis not present, extra-ocular motions intact bilaterally V,VII: smile symmetric, facial light touch sensationintact VIII: hearing normal bilaterally IX,X: gag reflex present WG:NFAOZH to assess due to difficulty with comprehension. XII: midline tongue extension Motor: Break gravity with purposeful movement in bilateral upper and lower extremities, however cannot fully assess strength due to difficulty with comprehension. Tone and bulk:normal tone throughout; no atrophy noted Sensory: Pinprick and  light touchintact bilaterally Deep Tendon Reflexes: 2+ and symmetric throughout Plantars: Right:downgoingLeft:downgoing Cerebellar: Unable to assess due to difficulty with comprehension Gait: not tested due to safety concerns  Lab Results: Basic Metabolic Panel: Recent Labs  Lab 08/12/18 2043 08/12/18 2056 08/13/18 0420 08/14/18 0932 08/16/18 2052 08/17/18 1329  NA 142 141 142 141 142 142  K 4.5 4.6 3.9 4.4 4.4 4.1  CL 108 107 108 112* 109 107  CO2 24  --  29 19* 24 26  GLUCOSE 114* 114* 99 103* 124* 101*  BUN 32* 34* 29* 23 30* 26*  CREATININE 2.03* 2.20* 1.82* 1.53* 1.89* 1.41*  CALCIUM 9.1  --  8.7* 8.5* 8.2* 8.7*    Liver Function Tests: Recent Labs  Lab 08/12/18 2043 08/13/18 0420 08/16/18 2052  AST 11* 10* 18  ALT <5 <5 <5  ALKPHOS 71 63 99  BILITOT 0.5 0.6 0.5  PROT 6.5 5.5* 6.3*  ALBUMIN 3.6 3.0* 3.3*   No results for input(s): LIPASE, AMYLASE in the last 168 hours. Recent Labs  Lab 08/13/18 0420 08/16/18 2052  AMMONIA 9 26    CBC: Recent Labs  Lab 08/12/18 2043 08/12/18 2056 08/13/18 0420 08/16/18 2313 08/17/18 1956  WBC 4.7  --  4.1 5.2 6.0  NEUTROABS 2.5  --   --  3.6  --   HGB 10.5* 10.5* 9.8* 9.3* 11.0*  HCT 33.8* 31.0* 31.7* 29.5* 34.2*  MCV 94.4  --  93.8 96.1 93.4  PLT 126*  --  115* 95* 122*    Cardiac Enzymes: Recent Labs  Lab 08/16/18 2052  TROPONINI <0.03    Lipid Panel: No results for input(s): CHOL, TRIG, HDL, CHOLHDL,  VLDL, LDLCALC in the last 168 hours.  CBG: Recent Labs  Lab 08/12/18 2042 08/18/18 1034 08/19/18 0747  GLUCAP 106* 111* 112*    Microbiology: Results for orders placed or performed during the hospital encounter of 08/16/18  Urine Culture     Status: Abnormal (Preliminary result)   Collection Time: 08/16/18  8:52 PM  Result Value Ref Range Status   Specimen Description   Final    URINE, CATHETERIZED Performed at [facility], [address],  [place], [place] [address]    Special Requests   Final    Normal Performed at [facility], [address] [place], [place] [address]    Culture >=100,000 COLONIES/mL KLEBSIELLA PNEUMONIAE (A)  Final   Report Status PENDING  Incomplete  MRSA PCR Screening     Status: None   Collection Time: 08/17/18  1:14 PM  Result Value Ref Range Status   MRSA by PCR NEGATIVE NEGATIVE Final    Comment:        The GeneXpert MRSA Assay (FDA approved for NASAL specimens only), is one component of a comprehensive MRSA colonization surveillance program. It is not intended to diagnose MRSA infection nor to guide or monitor treatment for MRSA infections. Performed at [facility], [address] [place], [place] [address]     Coagulation Studies: No results for input(s): LABPROT, INR in the last 72 hours.  Imaging: No results found.  Medications:  I have reviewed the patient's current medications. Prior to Admission:  Medications Prior to Admission  Medication Sig Dispense Refill Last Dose  . acetaminophen (TYLENOL) 325 MG tablet Take 650 mg by mouth every 4 (four) hours as needed.    prn at prn  . amLODipine (NORVASC) 10 MG tablet Take 10 mg by mouth daily.   08/16/2018 at 0900  . aspirin EC 325 MG tablet Take 325 mg by mouth daily.    08/16/2018 at 0900  . BREO ELLIPTA 100-25 MCG/INH AEPB Inhale 1 puff into the lungs daily.   08/16/2018 at 0900  . calcitRIOL (ROCALTROL) 0.25 MCG capsule Take 0.25 mcg by mouth daily.    08/16/2018 at 0900  . carbidopa-levodopa (SINEMET IR) 25-100 MG tablet Take 1 tablet by mouth 3 (three) times daily.   08/16/2018 at 2000  . donepezil (ARICEPT) 10 MG tablet Take 10 mg by mouth at bedtime.    08/16/2018 at 2000  . escitalopram (LEXAPRO) 10 MG tablet Take 10 mg by mouth daily.   08/16/2018 at 0900  . ferrous sulfate 325 (65 FE) MG tablet Take 325 mg by mouth 2 (two) times daily with a meal.    08/16/2018 at 2000  . levETIRAcetam (KEPPRA) 250 MG  tablet Take 1 tablet (250 mg total) by mouth 2 (two) times daily. 60 tablet 0 08/16/2018 at 0900  . loperamide (IMODIUM) 2 MG capsule Take by mouth as needed for diarrhea or loose stools.   prn at prn  . LORazepam (ATIVAN) 0.5 MG tablet Take 0.5 mg by mouth every 4 (four) hours as needed for anxiety.   prn at prn  . Maltodextrin-Xanthan Gum (RESOURCE THICKENUP CLEAR) POWD WITH EVERY MEAL   08/16/2018 at Unknown time  . morphine (ROXANOL) 20 MG/ML concentrated solution Take 0.25 mg by mouth every hour as needed for severe pain.    prn at prn  . Multiple Vitamins-Minerals (PRESERVISION AREDS 2 PO) Take 2 capsules by mouth daily.   08/16/2018 at 0900  . Polyethylene Glycol 3350 (MIRALAX PO) Take  17 g by mouth as needed (Constipation).    prn at prn  . Probiotic Product (PROBIOTIC DAILY) CAPS Take 1 capsule by mouth 2 (two) times daily.    08/16/2018 at 2000  . QUEtiapine (SEROQUEL) 25 MG tablet Take 25 mg by mouth at bedtime.   08/16/2018 at 2000  . umeclidinium bromide (INCRUSE ELLIPTA) 62.5 MCG/INH AEPB Inhale 1 puff into the lungs daily.   08/16/2018 at 0900  . vitamin B-12 (CYANOCOBALAMIN) 1000 MCG tablet Take 1,000 mcg by mouth daily.   Past Month at Unknown time  . miconazole (BAZA ANTIFUNGAL) 2 % cream Apply 1 application topically 2 (two) times daily as needed. To sacrum every shift for redness after each toilet   Not Taking at Unknown time  . Soft Lens Products (EQ SALINE SOLUTION/SENSITIVE) SOLN Apply 1 application topically every other day. Apply to neck topically once every other day for wound until healed.   Not Taking at Unknown time   Scheduled: . acidophilus  1 capsule Oral BID  . amLODipine  10 mg Oral Daily  . aspirin EC  325 mg Oral Daily  . carbidopa-levodopa  1 tablet Oral TID  . docusate sodium  100 mg Oral BID  . donepezil  10 mg Oral QHS  . escitalopram  10 mg Oral Daily  . ferrous sulfate  325 mg Oral BID WC  . fluticasone furoate-vilanterol  1 puff Inhalation Daily  .  heparin  5,000 Units Subcutaneous Q8H  . hydrALAZINE  10 mg Oral Q8H  . lacosamide  100 mg Oral BID  . multivitamin-lutein  1 capsule Oral Daily  . nitroGLYCERIN  0.5 inch Topical Q6H  . RESOURCE THICKENUP CLEAR  1 g Oral TID PC  . umeclidinium bromide  1 puff Inhalation Daily  . vitamin B-12  1,000 mcg Oral Daily    Patient seen and examined.  Clinical course and management discussed.  Necessary edits performed.  I agree with the above.  Assessment and plan of care developed and discussed below.     Assessment/Plan: 84 year old female presenting with altered mental status and seizures in the setting of infectious process. Remains confused and lethargic but able to follow commands intermittently.    Plan 1.Decrease Vimpat to 50 mg BID due to drowsiness/ sleepiness. 2. Seizure precautions 3. Ativan prn seizure activity 4.Agree with current medical management for underlying infectious process.  This patient was staffed with Dr. Verlon Au, Thad Ranger who personally evaluated patient, reviewed documentation and agreed with assessment and plan of care as above.  Webb Silversmith, DNP, FNP-BC Board certified Nurse Practitioner Neurology Department   LOS: 3 days   08/19/2018  9:51 AM  Thana Farr, MD Neurology [phone]  08/19/2018  1:08 PM

## 2018-08-19 NOTE — Progress Notes (Signed)
Patient ID: Stacey Baker, female   DOB: 07-14-1932, 84 y.o.   MRN: [id number]  [employer] PROGRESS NOTE  Naama Sappington WUJ:[id number] DOB: 1932/08/04 DOA: 08/16/2018 PCP: Merlene Pulling, PA-C  HPI/Subjective:  Confused. Poor PO intake  Objective: Vitals:   08/19/18 0421 08/19/18 0606  BP: (!) 191/77 (!) 145/86  Pulse: 70 70  Resp: 16   Temp: (!) 97.5 F (36.4 C)   SpO2: 96% 92%    Filed Weights   08/18/18 0413 08/19/18 0421  Weight: 58.4 kg 59.4 kg    ROS: Review of Systems  Unable to perform ROS: Mental status change   Exam: Physical Exam  HENT:  Nose: No mucosal edema.  Mouth/Throat: No oropharyngeal exudate or posterior oropharyngeal edema.  Eyes: Pupils are equal, round, and reactive to light. Conjunctivae, EOM and lids are normal.  Neck: No JVD present. Carotid bruit is not present. No edema present. No thyroid mass and no thyromegaly present.  Cardiovascular: S1 normal and S2 normal. Exam reveals no gallop.  No murmur heard. Pulses:      Dorsalis pedis pulses are 2+ on the right side, and 2+ on the left side.  Respiratory: No respiratory distress. She has decreased breath sounds in the right lower field and the left lower field. She has no wheezes. She has no rhonchi. She has no rales.  GI: Soft. Bowel sounds are normal. There is no tenderness.  Musculoskeletal:       Right shoulder: She exhibits no swelling.       Right ankle: She exhibits no swelling.       Left ankle: She exhibits no swelling.  Lymphadenopathy:    She has no cervical adenopathy.  Neurological: She is alert. No cranial nerve deficit.  Not following instructions  Skin: Skin is warm. Nails show no clubbing.  Bruising upper and lower extremities  Psychiatric:  Confused.      Data Reviewed: Basic Metabolic Panel: Recent Labs  Lab 08/12/18 2043 08/12/18 2056 08/13/18 0420 08/14/18 0932 08/16/18 2052 08/17/18 1329  NA 142 141 142 141 142 142  K 4.5 4.6 3.9 4.4 4.4 4.1   CL 108 107 108 112* 109 107  CO2 24  --  29 19* 24 26  GLUCOSE 114* 114* 99 103* 124* 101*  BUN 32* 34* 29* 23 30* 26*  CREATININE 2.03* 2.20* 1.82* 1.53* 1.89* 1.41*  CALCIUM 9.1  --  8.7* 8.5* 8.2* 8.7*   Liver Function Tests: Recent Labs  Lab 08/12/18 2043 08/13/18 0420 08/16/18 2052  AST 11* 10* 18  ALT <5 <5 <5  ALKPHOS 71 63 99  BILITOT 0.5 0.6 0.5  PROT 6.5 5.5* 6.3*  ALBUMIN 3.6 3.0* 3.3*    Recent Labs  Lab 08/13/18 0420 08/16/18 2052  AMMONIA 9 26   CBC: Recent Labs  Lab 08/12/18 2043 08/12/18 2056 08/13/18 0420 08/16/18 2313 08/17/18 1956  WBC 4.7  --  4.1 5.2 6.0  NEUTROABS 2.5  --   --  3.6  --   HGB 10.5* 10.5* 9.8* 9.3* 11.0*  HCT 33.8* 31.0* 31.7* 29.5* 34.2*  MCV 94.4  --  93.8 96.1 93.4  PLT 126*  --  115* 95* 122*   Cardiac Enzymes: Recent Labs  Lab 08/16/18 2052  TROPONINI <0.03    CBG: Recent Labs  Lab 08/12/18 2042 08/18/18 1034 08/19/18 0747  GLUCAP 106* 111* 112*    Recent Results (from the past 240 hour(s))  MRSA PCR Screening     Status:  None   Collection Time: 08/13/18  3:14 AM  Result Value Ref Range Status   MRSA by PCR NEGATIVE NEGATIVE Final    Comment:        The GeneXpert MRSA Assay (FDA approved for NASAL specimens only), is one component of a comprehensive MRSA colonization surveillance program. It is not intended to diagnose MRSA infection nor to guide or monitor treatment for MRSA infections. Performed at [facility], [address] [place], [place] [address]   Urine Culture     Status: Abnormal   Collection Time: 08/16/18  8:52 PM  Result Value Ref Range Status   Specimen Description   Final    URINE, CATHETERIZED Performed at [facility], [address] [place], [place] [address]    Special Requests   Final    Normal Performed at [facility], [address] [place], [place] [address]    Culture >=100,000 COLONIES/mL KLEBSIELLA PNEUMONIAE (A)  Final   Report  Status 08/19/2018 FINAL  Final   Organism ID, Bacteria KLEBSIELLA PNEUMONIAE (A)  Final      Susceptibility   Klebsiella pneumoniae - MIC*    AMPICILLIN >=32 RESISTANT Resistant     CEFAZOLIN <=4 SENSITIVE Sensitive     CEFTRIAXONE <=1 SENSITIVE Sensitive     CIPROFLOXACIN <=0.25 SENSITIVE Sensitive     GENTAMICIN <=1 SENSITIVE Sensitive     IMIPENEM <=0.25 SENSITIVE Sensitive     NITROFURANTOIN 128 RESISTANT Resistant     TRIMETH/SULFA <=20 SENSITIVE Sensitive     AMPICILLIN/SULBACTAM 8 SENSITIVE Sensitive     PIP/TAZO <=4 SENSITIVE Sensitive     Extended ESBL NEGATIVE Sensitive     * >=100,000 COLONIES/mL KLEBSIELLA PNEUMONIAE  MRSA PCR Screening     Status: None   Collection Time: 08/17/18  1:14 PM  Result Value Ref Range Status   MRSA by PCR NEGATIVE NEGATIVE Final    Comment:        The GeneXpert MRSA Assay (FDA approved for NASAL specimens only), is one component of a comprehensive MRSA colonization surveillance program. It is not intended to diagnose MRSA infection nor to guide or monitor treatment for MRSA infections. Performed at [facility], [address] [place], [place] [address]      Studies: No results found.  Scheduled Meds: . acidophilus  1 capsule Oral BID  . amLODipine  10 mg Oral Daily  . aspirin EC  325 mg Oral Daily  . carbidopa-levodopa  1 tablet Oral TID  . docusate sodium  100 mg Oral BID  . donepezil  10 mg Oral QHS  . escitalopram  10 mg Oral Daily  . ferrous sulfate  325 mg Oral BID WC  . fluticasone furoate-vilanterol  1 puff Inhalation Daily  . heparin  5,000 Units Subcutaneous Q8H  . hydrALAZINE  10 mg Oral Q8H  . lacosamide  50 mg Oral BID  . multivitamin-lutein  1 capsule Oral Daily  . nitroGLYCERIN  0.5 inch Topical Q6H  . RESOURCE THICKENUP CLEAR  1 g Oral TID PC  . umeclidinium bromide  1 puff Inhalation Daily  . vitamin B-12  1,000 mcg Oral Daily   Continuous Infusions: . sodium chloride    . cefTRIAXone  (ROCEPHIN)  IV Stopped (08/19/18 1043)    Assessment/Plan:  1. Breakthrough seizures.  Neurology discontinued Keppra and started on Vimpat.  Go home on Vimpat 100 mg twice a day at discharge.  2. Acute cystitis with hematuria.  On Rocephin.  Urine culture growing 100,000 gram-negative rods.  Awaiting ID and sensitivities. 3. Acute kidney injury on chronic kidney disease stage III.   4. Start 1/2 NS due to poor PO intake 5. History of stroke and TIA on aspirin 6. Accelerated hypertension on amlodipine 7. Parkinson's disease on Sinemet 8. Dementia on Aricept. 9. Weakness.  Physical therapy evaluation  Patient in assisted living facility followed by hospice.  Spoke with son over the phone  Code Status:     Code Status Orders  (From admission, onward)         Start     Ordered   08/17/18 0856  Do not attempt resuscitation (DNR)  Continuous    Question Answer Comment  In the event of cardiac or respiratory ARREST Do not call a "code blue"   In the event of cardiac or respiratory ARREST Do not perform Intubation, CPR, defibrillation or ACLS   In the event of cardiac or respiratory ARREST Use medication by any route, position, wound care, and other measures to relive pain and suffering. May use oxygen, suction and manual treatment of airway obstruction as needed for comfort.      08/17/18 0856        Code Status History    Date Active Date Inactive Code Status Order ID Comments User Context   08/13/2018 0318 08/16/2018 1650 DNR [id number]  John Giovanni, MD Inpatient   08/13/2018 0318 08/13/2018 0318 DNR [id number]  John Giovanni, MD Inpatient   02/26/2017 2325 03/09/2017 1922 DNR [id number]  Katharina Caper, MD Inpatient   07/05/2016 1747 07/09/2016 1951 Full Code [id number]  Marguarite Arbour, MD ED   07/03/2016 1104 07/03/2016 1726 Full Code [id number]  Milagros Loll, MD ED   12/31/2015 1742 01/06/2016 1912 Full Code [id number]  [name], MD Inpatient   09/13/2015 2255  09/15/2015 2025 Full Code [id number]  Oralia Manis, MD Inpatient   08/28/2015 0814 08/29/2015 1649 DNR [id number]  Delfino Lovett, MD Inpatient   08/27/2015 2325 08/28/2015 0814 Full Code [id number]  Hower, Cletis Athens, MD ED     Family Communication: Spoke with son on the phone and then at the bedside Disposition Plan: Would like her mental status will be better prior to getting out of the hospital.  Consultants:  Neurology  Antibiotics: -Rocephin  Time spent: 30 minutes  Jaziya Obarr R Dorethia Jeanmarie  [employer]

## 2018-08-19 NOTE — Progress Notes (Signed)
Visit made. Patient seen lying in bed, eyes closed, did not respond to verbal or gentle tactile stimulation. She was not alert enough today to take oral medications and has not eaten. She did receive IV anti seizure medications and a PRN dose of IV lorazepam last night. Son has been present at some point during the day, but was not at bedside at time of visit. Chart notes reviewed, and communicated with staff RN Jamesetta So and CSW Ruthe Mannan. Will continue to follow and update hospice team. No discharge date at this time. Dayna Barker RN, BSN, [facility] [place], hospital liaison [phone]

## 2018-08-19 NOTE — Progress Notes (Signed)
PT Cancellation Note  Patient Details Name: Stacey Baker MRN: [id number] DOB: November 10, 1931   Cancelled Treatment:    Reason Eval/Treat Not Completed: Fatigue/lethargy limiting ability to participate.  Pt with eyes closed and leaning to the L in bed upon PT arrival.  Light turned on and pt repositioned in bed, all with pt keeping eyes closed.  Pt not following any commands.  Pt not opening eyes to tactile cues.  Will attempt to see pt again once more next date with hopes that she will be able to participate.     Encarnacion Chu PT, DPT 08/19/2018, 9:27 AM

## 2018-08-20 ENCOUNTER — Other Ambulatory Visit: Payer: Self-pay

## 2018-08-20 ENCOUNTER — Inpatient Hospital Stay

## 2018-08-20 LAB — BASIC METABOLIC PANEL
ANION GAP: 9 (ref 5–15)
BUN: 26 mg/dL — ABNORMAL HIGH (ref 8–23)
CO2: 23 mmol/L (ref 22–32)
Calcium: 8.5 mg/dL — ABNORMAL LOW (ref 8.9–10.3)
Chloride: 110 mmol/L (ref 98–111)
Creatinine, Ser: 1.64 mg/dL — ABNORMAL HIGH (ref 0.44–1.00)
GFR calc Af Amer: 32 mL/min — ABNORMAL LOW (ref >60)
GFR, EST NON AFRICAN AMERICAN: 28 mL/min — AB (ref >60)
GLUCOSE: 88 mg/dL (ref 70–99)
POTASSIUM: 4 mmol/L (ref 3.5–5.1)
Sodium: 142 mmol/L (ref 135–145)

## 2018-08-20 LAB — CBC
HEMATOCRIT: 33.3 % — AB (ref 36.0–46.0)
HEMOGLOBIN: 10.8 g/dL — AB (ref 12.0–15.0)
MCH: 29.7 pg (ref 26.0–34.0)
MCHC: 32.4 g/dL (ref 30.0–36.0)
MCV: 91.5 fL (ref 80.0–100.0)
NRBC: 0 % (ref 0.0–0.2)
PLATELETS: 136 10*3/uL — AB (ref 150–400)
RBC: 3.64 MIL/uL — AB (ref 3.87–5.11)
RDW: 13.7 % (ref 11.5–15.5)
WBC: 5.6 10*3/uL (ref 4.0–10.5)

## 2018-08-20 LAB — GLUCOSE, CAPILLARY: Glucose-Capillary: 75 mg/dL (ref 70–99)

## 2018-08-20 MED ORDER — SODIUM CHLORIDE 0.9 % IV SOLN
50.0000 mg | Freq: Two times a day (BID) | INTRAVENOUS | Status: DC
  Filled 2018-08-20 (×2): qty 5

## 2018-08-20 MED ORDER — SODIUM CHLORIDE 0.9 % IV SOLN
100.0000 mg | Freq: Once | INTRAVENOUS | Status: AC
  Administered 2018-08-20: 100 mg via INTRAVENOUS
  Filled 2018-08-20: qty 10

## 2018-08-20 MED ORDER — SODIUM CHLORIDE 0.9 % IV SOLN
100.0000 mg | Freq: Two times a day (BID) | INTRAVENOUS | Status: DC
  Administered 2018-08-20 – 2018-08-22 (×4): 100 mg via INTRAVENOUS
  Filled 2018-08-20 (×8): qty 10

## 2018-08-20 MED ORDER — LORAZEPAM 2 MG/ML IJ SOLN
INTRAMUSCULAR | Status: AC
  Filled 2018-08-20: qty 1

## 2018-08-20 MED ORDER — LORAZEPAM 2 MG/ML IJ SOLN
1.0000 mg | Freq: Every day | INTRAMUSCULAR | Status: AC | PRN
  Administered 2018-08-21: 03:00:00 1 mg via INTRAVENOUS
  Filled 2018-08-20: qty 1

## 2018-08-20 MED ORDER — LORAZEPAM 2 MG/ML IJ SOLN
0.5000 mg | Freq: Once | INTRAMUSCULAR | Status: AC
  Administered 2018-08-20: 0.5 mg via INTRAVENOUS

## 2018-08-20 MED ORDER — HYDRALAZINE HCL 20 MG/ML IJ SOLN
10.0000 mg | Freq: Four times a day (QID) | INTRAMUSCULAR | Status: DC | PRN
  Filled 2018-08-20: qty 1

## 2018-08-20 NOTE — Procedures (Signed)
ELECTROENCEPHALOGRAM REPORT   Patient: Stacey Baker       Room #: 107A-AA EEG No. ID: [id number] Age: 84 y.o.        Sex: female Referring Physician: Nemiah Commander Report Date:  08/20/2018        Interpreting Physician: Thana Farr  History: [name] is an 84 y.o. female with seizures and altered mental status  Medications:  Risaquad, Norvasc, ASA, Sinemet, Colace, Aricept, lexapro, Ferrous sulfate, Breo Ellipta, Apresoline, Vimpat, B12  Conditions of Recording:  This is a 21 channel routine scalp EEG performed with bipolar and monopolar montages arranged in accordance to the international 10/20 system of electrode placement. One channel was dedicated to EKG recording.  The patient is in the poorly responsive state.  Description:  The background activity is slow and poorly organized.  This slow actrivity is diffusely distributed with what appears to be intermittent vertex central transients that are frequentlyt noted.  On multiple occasions after the vertex central sharp transients the background rhythtm slows even further and the voltage is attenutated.  This attenuated activity is synchronous betweeen the hemispheres.  This discontinuous activity persists throughout the recording but is not regular or rhythmical.  The patiet also has multiple episode of head jerking during the reocording without epileptic correlate.   Hyperventilation and intermittent photic stimulation were not performed.  IMPRESSION: This is an abnormal electroencephalogram consistent with a post-ictal state.  No ictal phenomenon were noted.     Thana Farr, MD Neurology [phone] 08/20/2018, 5:39 PM

## 2018-08-20 NOTE — Plan of Care (Signed)
  Problem: Education: Goal: Knowledge of General Education information will improve Description Including pain rating scale, medication(s)/side effects and non-pharmacologic comfort measures Outcome: Progressing   Problem: Health Behavior/Discharge Planning: Goal: Ability to manage health-related needs will improve Outcome: Progressing   Problem: Clinical Measurements: Goal: Ability to maintain clinical measurements within normal limits will improve Outcome: Progressing   Problem: Education: Goal: Expressions of having a comfortable level of knowledge regarding the disease process will increase Outcome: Progressing   Problem: Coping: Goal: Ability to adjust to condition or change in health will improve Outcome: Progressing   Problem: Problem: Skin/Wound Progression Goal: Adequate nutrition will be maintained Outcome: Progressing   Problem: Problem: Skin/Wound Progression Goal: Adequate nutrition will be maintained Outcome: Progressing

## 2018-08-20 NOTE — Progress Notes (Signed)
eeg completed ° °

## 2018-08-20 NOTE — Progress Notes (Signed)
Palliative Medicine Consult Order Noted. Due to high referral volume and limited staffing, there will be a delay seeing this patient. Palliative Medicine Provider will return to [facility] on 11/4, and patient will be evaluated then. Please call the Palliative Medicine Team office at [phone] if recommendations are needed in the interim.  Thank you for inviting us to see this patient.  Cadee Agro G. Deane Wattenbarger, RN, BSN, CHPN Palliative Medicine Team 08/20/2018 1:51 PM Office [phone] 

## 2018-08-20 NOTE — Progress Notes (Signed)
Follow up on current hospice patient.  Patient had witnessed seizure this morning and was post-ictal.  Patient remains lethargic this afternoon.  MD has placed palliative care consult to discuss goals of care.  Palliative will follow up Monday due to their caseload.  Will continue to follow through final disposition.    Norris Cross, RN

## 2018-08-20 NOTE — Progress Notes (Signed)
PT Cancellation Note  Patient Details Name: Stacey Baker MRN: [id number] DOB: Jul 23, 1932   Cancelled Treatment:     Pt order received and chart reviewed. Pt asleep in bed on arrival. PT attempted to arouse patient 3x with verbal and tactile cues without response. PT has attempted evaluation 3x on separate dates. D/t inability to remain alert and appropriately follow commands on all attempts pt is not appropriate for PT at this time. Will dc current orders. RN aware.  Arvilla Meres, SPT  08/20/2018, 10:49 AM

## 2018-08-20 NOTE — Progress Notes (Signed)
[facility] - [place] at [facility]   PATIENT NAME: Stacey Baker    MR#:  [id number]  DATE OF BIRTH:  11-03-1931  SUBJECTIVE:  CHIEF COMPLAINT:   Chief Complaint  Patient presents with  . Seizures   - unresponsive again, witnessed seizure this AM  REVIEW OF SYSTEMS:  Review of Systems  Unable to perform ROS: Patient unresponsive    DRUG ALLERGIES:   Allergies  Allergen Reactions  . Penicillins Other (See Comments)    "Allergic," per [facility] Has patient had a PCN reaction causing immediate rash, facial/tongue/throat swelling, SOB or lightheadedness with hypotension: Unk Has patient had a PCN reaction causing severe rash involving mucus membranes or skin necrosis: Unk Has patient had a PCN reaction that required hospitalization: Unk Has patient had a PCN reaction occurring within the last 10 years: Unk If all of the above answers are "NO", then may proceed with Cephalosporin use.   Marland Kitchen Plavix [Clopidogrel Bisulfate] Other (See Comments)    "Allergic," per [facility]    VITALS:  Blood pressure (!) 172/57, pulse (!) 58, temperature 98 F (36.7 C), temperature source Rectal, resp. rate 16, weight 59.5 kg, SpO2 98 %.  PHYSICAL EXAMINATION:  Physical Exam  GENERAL:  84 y.o.-year-old patient lying in the bed, not very responsive EYES: Pupils equal, round, reactive to light and accommodation. No scleral icterus. Extraocular muscles intact.  HEENT: Head atraumatic, normocephalic. Oropharynx and nasopharynx clear.  NECK:  Supple, no jugular venous distention. No thyroid enlargement, no tenderness.  LUNGS: Normal breath sounds bilaterally, no wheezing, rales,rhonchi or crepitation. No use of accessory muscles of respiration. Decreased bibasilar breath sounds CARDIOVASCULAR: S1, S2 normal. No  rubs, or gallops. 3/6 systolic murmur present ABDOMEN: Soft, nontender, nondistended. Bowel sounds present. No organomegaly or mass.  EXTREMITIES: No pedal edema, cyanosis, or  clubbing.  NEUROLOGIC: Unresponsive, arousable to palpation but not tracking or following any commands PSYCHIATRIC: The patient is lethargic SKIN: No obvious rash, lesion, or ulcer.    LABORATORY PANEL:   CBC Recent Labs  Lab 08/20/18 0433  WBC 5.6  HGB 10.8*  HCT 33.3*  PLT 136*   ------------------------------------------------------------------------------------------------------------------  Chemistries  Recent Labs  Lab 08/16/18 2052  08/20/18 0433  NA 142   < > 142  K 4.4   < > 4.0  CL 109   < > 110  CO2 24   < > 23  GLUCOSE 124*   < > 88  BUN 30*   < > 26*  CREATININE 1.89*   < > 1.64*  CALCIUM 8.2*   < > 8.5*  AST 18  --   --   ALT <5  --   --   ALKPHOS 99  --   --   BILITOT 0.5  --   --    < > = values in this interval not displayed.   ------------------------------------------------------------------------------------------------------------------  Cardiac Enzymes Recent Labs  Lab 08/16/18 2052  TROPONINI <0.03   ------------------------------------------------------------------------------------------------------------------  RADIOLOGY:  No results found.  EKG:   Orders placed or performed during the hospital encounter of 08/16/18  . EKG 12-Lead  . EKG 12-Lead  . ED EKG  . ED EKG  . EKG 12-Lead  . EKG 12-Lead    ASSESSMENT AND PLAN:   84 year old female with past medical history significant for dementia, hypertension, Parkinson's disease and recent admission to [facility] for stroke was brought in secondary to altered mental status  1.  Metabolic encephalopathy acute-likely secondary to seizure and  postictal state -Appreciate neurology consult.  Keppra was discontinued and started on Vimpat -Vimpat was not properly given yesterday, had a witnessed seizure this morning -Loaded with Vimpat again and started on low-dose 50 twice daily as increased dose was causing her to be lethargic. -EEG today. -MRI of the brain with no acute  findings.  2.  Klebsiella UTI-on Rocephin, will finish her 5 days today  3.  Parkinson's disease-on Sinemet  4.  Dementia-on Aricept.  Also on Lexapro  5.  DVT prophylaxis-on subcu heparin  Patient is from assisted living facility and is followed by hospice.  However currently very lethargic.  Will consult palliative.   All the records are reviewed and case discussed with Care Management/Social Workerr. Management plans discussed with the patient, family and they are in agreement.  CODE STATUS: DNR  TOTAL TIME TAKING CARE OF THIS PATIENT: 38 minutes.   POSSIBLE D/C IN 2-3 DAYS, DEPENDING ON CLINICAL CONDITION.   Enid Baas M.D on 08/20/2018 at 1:37 PM  Between 7am to 6pm - Pager - [phone]  After 6pm go to [email or web address] - Social research officer, government  [facility] [place] [facility]  Office  [phone]  CC: Primary care physician; Merlene Pulling, PA-C

## 2018-08-20 NOTE — Progress Notes (Addendum)
Subjective: Patient was witnessed having seizure like activity with clonic movements of the left arm and head deviated to the left. She was also foaming at the mouth lasting about 3 seconds followed by post ictal phase. Ativan 0.5 mg was administered and patient loaded with Vimpat 100 mg IV once.  Objective: Current vital signs: BP (!) 172/57   Pulse (!) 58   Temp 98 F (36.7 C) (Rectal)   Resp 16   Wt 59.5 kg   SpO2 98%   BMI 24.00 kg/m  Vital signs in last 24 hours: Temp:  [97.8 F (36.6 C)-98.9 F (37.2 C)] 98 F (36.7 C) (11/01 1112) Pulse Rate:  [58-78] 58 (11/01 1112) Resp:  [16-20] 16 (11/01 1112) BP: (158-185)/(57-125) 172/57 (11/01 1112) SpO2:  [93 %-98 %] 98 % (11/01 1112) Weight:  [59.5 kg] 59.5 kg (11/01 0417)  Intake/Output from previous day: 10/31 0701 - 11/01 0700 In: 1138.4 [P.O.:360; I.V.:648.4; IV Piggyback:130] Out: 800 [Urine:800] Intake/Output this shift: No intake/output data recorded. Nutritional status:  Diet Order            DIET DYS 2 Room service appropriate? Yes; Fluid consistency: Thin  Diet effective now             Neurologic Exam: Mental Status: Post-ictal state. Confused. Speechmumbledwithout evidence of aphasia. Unable to follow commands. Cranial Nerves: II: Discs flat bilaterally;blinks to threat bilaterally, pupils equal, round, reactive to light and accommodation III,IV, VI: ptosis not present, extra-ocular motions intact bilaterally V,VII: smile symmetric, facial light touch sensationintact VIII: hearing normal bilaterally IX,X: gag reflex present [id number] to assess due to difficulty with comprehension. XII: midline tongue extension Motor: Break gravity with purposeful movement in bilateral upper and lower extremities, however cannot fully assess strength due to difficulty with comprehension. Tone and bulk:normal tone throughout; no atrophy noted Sensory: Pinprick and light touchintact bilaterally Deep Tendon  Reflexes: 2+ and symmetric throughout Plantars: Right:downgoingLeft:downgoing Cerebellar: Unable to assess due to difficulty with comprehension Gait: not tested due to safety concerns  Lab Results: Basic Metabolic Panel: Recent Labs  Lab 08/14/18 0932 08/16/18 2052 08/17/18 1329 08/20/18 0433  NA 141 142 142 142  K 4.4 4.4 4.1 4.0  CL 112* 109 107 110  CO2 19* 24 26 23   GLUCOSE 103* 124* 101* 88  BUN 23 30* 26* 26*  CREATININE 1.53* 1.89* 1.41* 1.64*  CALCIUM 8.5* 8.2* 8.7* 8.5*   Liver Function Tests: Recent Labs  Lab 08/16/18 2052  AST 18  ALT <5  ALKPHOS 99  BILITOT 0.5  PROT 6.3*  ALBUMIN 3.3*   No results for input(s): LIPASE, AMYLASE in the last 168 hours. Recent Labs  Lab 08/16/18 2052  AMMONIA 26    CBC: Recent Labs  Lab 08/16/18 2313 08/17/18 1956 08/20/18 0433  WBC 5.2 6.0 5.6  NEUTROABS 3.6  --   --   HGB 9.3* 11.0* 10.8*  HCT 29.5* 34.2* 33.3*  MCV 96.1 93.4 91.5  PLT 95* 122* 136*    Cardiac Enzymes: Recent Labs  Lab 08/16/18 2052  TROPONINI <0.03    Lipid Panel: No results for input(s): CHOL, TRIG, HDL, CHOLHDL, VLDL, LDLCALC in the last 168 hours.  CBG: Recent Labs  Lab 08/18/18 1034 08/19/18 0747 08/20/18 0749  GLUCAP 111* 112* 75    Microbiology: Results for orders placed or performed during the hospital encounter of 08/16/18  Urine Culture     Status: Abnormal   Collection Time: 08/16/18  8:52 PM  Result Value Ref Range Status  Specimen Description   Final    URINE, CATHETERIZED Performed at [facility], [address] [place], [place] [address]    Special Requests   Final    Normal Performed at [facility], [address] [place], [place] [address]    Culture >=100,000 COLONIES/mL KLEBSIELLA PNEUMONIAE (A)  Final   Report Status 08/19/2018 FINAL  Final   Organism ID, Bacteria KLEBSIELLA PNEUMONIAE (A)  Final      Susceptibility   Klebsiella pneumoniae  - MIC*    AMPICILLIN >=32 RESISTANT Resistant     CEFAZOLIN <=4 SENSITIVE Sensitive     CEFTRIAXONE <=1 SENSITIVE Sensitive     CIPROFLOXACIN <=0.25 SENSITIVE Sensitive     GENTAMICIN <=1 SENSITIVE Sensitive     IMIPENEM <=0.25 SENSITIVE Sensitive     NITROFURANTOIN 128 RESISTANT Resistant     TRIMETH/SULFA <=20 SENSITIVE Sensitive     AMPICILLIN/SULBACTAM 8 SENSITIVE Sensitive     PIP/TAZO <=4 SENSITIVE Sensitive     Extended ESBL NEGATIVE Sensitive     * >=100,000 COLONIES/mL KLEBSIELLA PNEUMONIAE  MRSA PCR Screening     Status: None   Collection Time: 08/17/18  1:14 PM  Result Value Ref Range Status   MRSA by PCR NEGATIVE NEGATIVE Final    Comment:        The GeneXpert MRSA Assay (FDA approved for NASAL specimens only), is one component of a comprehensive MRSA colonization surveillance program. It is not intended to diagnose MRSA infection nor to guide or monitor treatment for MRSA infections. Performed at [facility], [address] [place], [place] [address]     Coagulation Studies: No results for input(s): LABPROT, INR in the last 72 hours.  Imaging: No results found.  Medications:  I have reviewed the patient's current medications. Prior to Admission:  Medications Prior to Admission  Medication Sig Dispense Refill Last Dose  . acetaminophen (TYLENOL) 325 MG tablet Take 650 mg by mouth every 4 (four) hours as needed.    prn at prn  . amLODipine (NORVASC) 10 MG tablet Take 10 mg by mouth daily.   08/16/2018 at 0900  . aspirin EC 325 MG tablet Take 325 mg by mouth daily.    08/16/2018 at 0900  . BREO ELLIPTA 100-25 MCG/INH AEPB Inhale 1 puff into the lungs daily.   08/16/2018 at 0900  . calcitRIOL (ROCALTROL) 0.25 MCG capsule Take 0.25 mcg by mouth daily.    08/16/2018 at 0900  . carbidopa-levodopa (SINEMET IR) 25-100 MG tablet Take 1 tablet by mouth 3 (three) times daily.   08/16/2018 at 2000  . donepezil (ARICEPT) 10 MG tablet Take 10 mg by mouth at  bedtime.    08/16/2018 at 2000  . escitalopram (LEXAPRO) 10 MG tablet Take 10 mg by mouth daily.   08/16/2018 at 0900  . ferrous sulfate 325 (65 FE) MG tablet Take 325 mg by mouth 2 (two) times daily with a meal.    08/16/2018 at 2000  . levETIRAcetam (KEPPRA) 250 MG tablet Take 1 tablet (250 mg total) by mouth 2 (two) times daily. 60 tablet 0 08/16/2018 at 0900  . loperamide (IMODIUM) 2 MG capsule Take by mouth as needed for diarrhea or loose stools.   prn at prn  . LORazepam (ATIVAN) 0.5 MG tablet Take 0.5 mg by mouth every 4 (four) hours as needed for anxiety.   prn at prn  . Maltodextrin-Xanthan Gum (RESOURCE THICKENUP CLEAR) POWD WITH EVERY MEAL   08/16/2018 at Unknown time  .  morphine (ROXANOL) 20 MG/ML concentrated solution Take 0.25 mg by mouth every hour as needed for severe pain.    prn at prn  . Multiple Vitamins-Minerals (PRESERVISION AREDS 2 PO) Take 2 capsules by mouth daily.   08/16/2018 at 0900  . Polyethylene Glycol 3350 (MIRALAX PO) Take 17 g by mouth as needed (Constipation).    prn at prn  . Probiotic Product (PROBIOTIC DAILY) CAPS Take 1 capsule by mouth 2 (two) times daily.    08/16/2018 at 2000  . QUEtiapine (SEROQUEL) 25 MG tablet Take 25 mg by mouth at bedtime.   08/16/2018 at 2000  . umeclidinium bromide (INCRUSE ELLIPTA) 62.5 MCG/INH AEPB Inhale 1 puff into the lungs daily.   08/16/2018 at 0900  . vitamin B-12 (CYANOCOBALAMIN) 1000 MCG tablet Take 1,000 mcg by mouth daily.   Past Month at Unknown time  . miconazole (BAZA ANTIFUNGAL) 2 % cream Apply 1 application topically 2 (two) times daily as needed. To sacrum every shift for redness after each toilet   Not Taking at Unknown time  . Soft Lens Products (EQ SALINE SOLUTION/SENSITIVE) SOLN Apply 1 application topically every other day. Apply to neck topically once every other day for wound until healed.   Not Taking at Unknown time   Scheduled: . acidophilus  1 capsule Oral BID  . amLODipine  10 mg Oral Daily  . aspirin  EC  325 mg Oral Daily  . carbidopa-levodopa  1 tablet Oral TID  . docusate sodium  100 mg Oral BID  . donepezil  10 mg Oral QHS  . escitalopram  10 mg Oral Daily  . ferrous sulfate  325 mg Oral BID WC  . fluticasone furoate-vilanterol  1 puff Inhalation Daily  . heparin  5,000 Units Subcutaneous Q8H  . hydrALAZINE  10 mg Oral Q8H  . LORazepam      . multivitamin-lutein  1 capsule Oral Daily  . nitroGLYCERIN  0.5 inch Topical Q6H  . RESOURCE THICKENUP CLEAR  1 g Oral TID PC  . umeclidinium bromide  1 puff Inhalation Daily  . vitamin B-12  1,000 mcg Oral Daily   Patient seen and examined.  Clinical course and management discussed.  Necessary edits performed.  I agree with the above.  Assessment and plan of care developed and discussed below.    Assessment: 84 year old female presenting with altered mental status and seizuresin the setting of infectious process. Remains confused and lethargic. Had a witnessed seizure this morning likely breakthrough due to abrupt decrease in anticonvulsant levels.  Plan 1.Vimpat 100 mg IV once as a load with maintenance Vimpat at 50mg  BID. 2. Seizure precautions 3. Ativan prn seizure activity 4.Agree with current medical management for underlying infectious process.  This patient was staffed with Dr. Verlon Au, Thad Ranger who personally evaluated patient, reviewed documentation and agreed with assessment and plan of care as above.  Webb Silversmith, DNP, FNP-BC Board certified Nurse Practitioner Neurology Department   LOS: 4 days   08/20/2018  1:05 PM  Addendum: Patient with no significant imporvement in mental status with the progression of the day.  Will repeat EEG to rule nonconvulsive status.    Thana Farr, MD Neurology [phone]  08/20/2018  1:55 PM

## 2018-08-21 LAB — CBC
HCT: 33.5 % — ABNORMAL LOW (ref 36.0–46.0)
Hemoglobin: 10.8 g/dL — ABNORMAL LOW (ref 12.0–15.0)
MCH: 29.5 pg (ref 26.0–34.0)
MCHC: 32.2 g/dL (ref 30.0–36.0)
MCV: 91.5 fL (ref 80.0–100.0)
NRBC: 0 % (ref 0.0–0.2)
PLATELETS: 137 10*3/uL — AB (ref 150–400)
RBC: 3.66 MIL/uL — AB (ref 3.87–5.11)
RDW: 13.8 % (ref 11.5–15.5)
WBC: 7.4 10*3/uL (ref 4.0–10.5)

## 2018-08-21 LAB — BASIC METABOLIC PANEL
ANION GAP: 13 (ref 5–15)
BUN: 30 mg/dL — AB (ref 8–23)
CO2: 19 mmol/L — ABNORMAL LOW (ref 22–32)
Calcium: 8.5 mg/dL — ABNORMAL LOW (ref 8.9–10.3)
Chloride: 108 mmol/L (ref 98–111)
Creatinine, Ser: 1.78 mg/dL — ABNORMAL HIGH (ref 0.44–1.00)
GFR calc Af Amer: 29 mL/min — ABNORMAL LOW (ref >60)
GFR, EST NON AFRICAN AMERICAN: 25 mL/min — AB (ref >60)
Glucose, Bld: 77 mg/dL (ref 70–99)
POTASSIUM: 4 mmol/L (ref 3.5–5.1)
Sodium: 140 mmol/L (ref 135–145)

## 2018-08-21 LAB — GLUCOSE, CAPILLARY
GLUCOSE-CAPILLARY: 129 mg/dL — AB (ref 70–99)
Glucose-Capillary: 68 mg/dL — ABNORMAL LOW (ref 70–99)

## 2018-08-21 MED ORDER — LABETALOL HCL 5 MG/ML IV SOLN: 10.0000 mg | INTRAVENOUS | Status: DC | PRN

## 2018-08-21 MED ORDER — DEXTROSE-NACL 5-0.45 % IV SOLN
INTRAVENOUS | Status: DC
  Administered 2018-08-21 – 2018-08-22 (×2): via INTRAVENOUS

## 2018-08-21 MED ORDER — DEXTROSE 50 % IV SOLN
25.0000 mL | Freq: Once | INTRAVENOUS | Status: AC
  Administered 2018-08-21: 25 mL via INTRAVENOUS

## 2018-08-21 MED ORDER — DEXTROSE 50 % IV SOLN
INTRAVENOUS | Status: AC
  Filled 2018-08-21: qty 50

## 2018-08-21 NOTE — Plan of Care (Signed)
  Problem: Education: Goal: Knowledge of General Education information will improve Description Including pain rating scale, medication(s)/side effects and non-pharmacologic comfort measures Outcome: Progressing   Problem: Health Behavior/Discharge Planning: Goal: Ability to manage health-related needs will improve Outcome: Progressing   Problem: Clinical Measurements: Goal: Ability to maintain clinical measurements within normal limits will improve Outcome: Progressing   Problem: Education: Goal: Expressions of having a comfortable level of knowledge regarding the disease process will increase Outcome: Progressing   Problem: Coping: Goal: Ability to adjust to condition or change in health will improve Outcome: Progressing   Problem: Problem: Skin/Wound Progression Goal: Adequate nutrition will be maintained Outcome: Progressing

## 2018-08-21 NOTE — Progress Notes (Signed)
[facility] - [place] at [facility]   PATIENT NAME: Stacey Baker    MR#:  [id number]  DATE OF BIRTH:  June 05, 1932  SUBJECTIVE:  CHIEF COMPLAINT:   Chief Complaint  Patient presents with  . Seizures   - remains unresponsive - no seizures this am- received ativan last night-? Unknown if there was a seizure episode  REVIEW OF SYSTEMS:  Review of Systems  Unable to perform ROS: Patient unresponsive    DRUG ALLERGIES:   Allergies  Allergen Reactions  . Penicillins Other (See Comments)    "Allergic," per [facility] Has patient had a PCN reaction causing immediate rash, facial/tongue/throat swelling, SOB or lightheadedness with hypotension: Unk Has patient had a PCN reaction causing severe rash involving mucus membranes or skin necrosis: Unk Has patient had a PCN reaction that required hospitalization: Unk Has patient had a PCN reaction occurring within the last 10 years: Unk If all of the above answers are "NO", then may proceed with Cephalosporin use.   Marland Kitchen Plavix [Clopidogrel Bisulfate] Other (See Comments)    "Allergic," per [facility]    VITALS:  Blood pressure (!) 156/81, pulse 78, temperature 99.9 F (37.7 C), temperature source Oral, resp. rate 16, weight 61.8 kg, SpO2 97 %.  PHYSICAL EXAMINATION:  Physical Exam  GENERAL:  84 y.o.-year-old patient lying in the bed, not very responsive EYES: Pupils equal, round, reactive to light and accommodation. No scleral icterus. Extraocular muscles intact.  HEENT: Head atraumatic, normocephalic. Oropharynx and nasopharynx clear.  NECK:  Supple, no jugular venous distention. No thyroid enlargement, no tenderness.  LUNGS: Normal breath sounds bilaterally, no wheezing, rales,rhonchi or crepitation. No use of accessory muscles of respiration. Decreased bibasilar breath sounds CARDIOVASCULAR: S1, S2 normal. No  rubs, or gallops. 3/6 systolic murmur present ABDOMEN: Soft, nontender, nondistended. Bowel sounds present. No  organomegaly or mass.  EXTREMITIES: No pedal edema, cyanosis, or clubbing.  NEUROLOGIC: Unresponsive, arousable to palpation but not tracking or following any commands PSYCHIATRIC: The patient is lethargic SKIN: No obvious rash, lesion, or ulcer.    LABORATORY PANEL:   CBC Recent Labs  Lab 08/21/18 0654  WBC 7.4  HGB 10.8*  HCT 33.5*  PLT 137*   ------------------------------------------------------------------------------------------------------------------  Chemistries  Recent Labs  Lab 08/16/18 2052  08/21/18 0654  NA 142   < > 140  K 4.4   < > 4.0  CL 109   < > 108  CO2 24   < > 19*  GLUCOSE 124*   < > 77  BUN 30*   < > 30*  CREATININE 1.89*   < > 1.78*  CALCIUM 8.2*   < > 8.5*  AST 18  --   --   ALT <5  --   --   ALKPHOS 99  --   --   BILITOT 0.5  --   --    < > = values in this interval not displayed.   ------------------------------------------------------------------------------------------------------------------  Cardiac Enzymes Recent Labs  Lab 08/16/18 2052  TROPONINI <0.03   ------------------------------------------------------------------------------------------------------------------  RADIOLOGY:  No results found.  EKG:   Orders placed or performed during the hospital encounter of 08/16/18  . EKG 12-Lead  . EKG 12-Lead  . ED EKG  . ED EKG  . EKG 12-Lead  . EKG 12-Lead    ASSESSMENT AND PLAN:   84 year old female with past medical history significant for dementia, hypertension, Parkinson's disease and recent admission to [facility] for stroke was brought in secondary to altered mental  status  1.  Metabolic encephalopathy acute-likely secondary to seizure and postictal state -Appreciate neurology consult.  Keppra was discontinued and started on Vimpat -Loaded with Vimpat again and started on 100 mg twice daily  -EEG was abnormal with post ictal state changes. -MRI of the brain with no acute findings.  2.  Klebsiella UTI-on  Rocephin, finished 5 days trt  3.  Parkinson's disease-on Sinemet  4.  Dementia-on Aricept.  Also on Lexapro  5.  DVT prophylaxis-on subcu heparin  6. Poor oral intake- due to her underlying neurological condition- change fluids to D5 1/2NS as sugars low  Patient is from assisted living facility and is followed by hospice.  However currently very lethargic.   consult palliative. If no improvement- will need hospice home   All the records are reviewed and case discussed with Care Management/Social Workerr. Management plans discussed with the patient, family and they are in agreement.  CODE STATUS: DNR  TOTAL TIME TAKING CARE OF THIS PATIENT: 35 minutes.   POSSIBLE D/C IN 2-3 DAYS, DEPENDING ON CLINICAL CONDITION.   Enid Baas M.D on 08/21/2018 at 11:59 AM  Between 7am to 6pm - Pager - [phone]  After 6pm go to [email or web address] - Social research officer, government  [facility] [place] [facility]  Office  [phone]  CC: Primary care physician; Merlene Pulling, PA-C

## 2018-08-21 NOTE — Progress Notes (Signed)
Subjective: Patient remains extremely lethargic.  No further reported seizures  Objective: Current vital signs: BP (!) 156/81 (BP Location: Left Arm)   Pulse 78   Temp 99.9 F (37.7 C) (Oral)   Resp 16   Wt 61.8 kg   SpO2 97%   BMI 24.91 kg/m  Vital signs in last 24 hours: Temp:  [98 F (36.7 C)-99.9 F (37.7 C)] 99.9 F (37.7 C) (11/02 0925) Pulse Rate:  [58-84] 78 (11/02 0925) Resp:  [16-22] 16 (11/02 0925) BP: (151-178)/(80-87) 156/81 (11/02 0925) SpO2:  [97 %] 97 % (11/02 0925) Weight:  [61.6 kg-61.8 kg] 61.8 kg (11/02 0545)  Intake/Output from previous day: 11/01 0701 - 11/02 0700 In: 1134.1 [P.O.:220; I.V.:833.3; IV Piggyback:80.8] Out: 250 [Urine:250] Intake/Output this shift: No intake/output data recorded. Nutritional status:  Diet Order            DIET DYS 2 Room service appropriate? Yes; Fluid consistency: Thin  Diet effective now              Neurologic Exam: Mental Status: Sleeping when I enter the room.  Does not follow commands.  Mumbles with stimulation but no intelligible speech noted. Cranial Nerves: II: Discs flat bilaterally;blinks to threat bilaterally, pupils equal, round, reactive to light and accommodation III,IV, VI: ptosis not present, extra-ocular motions intact bilaterally V,VII: weak corneals bilaterally VIII: unable to test IX,X: gag reflex present [id number] to test XII: unable to test Motor: Moves upper extremities in response to noxious stimuli Sensory: Responds to noxious stimuli in the extremities  Lab Results: Basic Metabolic Panel: Recent Labs  Lab 08/16/18 2052 08/17/18 1329 08/20/18 0433 08/21/18 0654  NA 142 142 142 140  K 4.4 4.1 4.0 4.0  CL 109 107 110 108  CO2 24 26 23  19*  GLUCOSE 124* 101* 88 77  BUN 30* 26* 26* 30*  CREATININE 1.89* 1.41* 1.64* 1.78*  CALCIUM 8.2* 8.7* 8.5* 8.5*    Liver Function Tests: Recent Labs  Lab 08/16/18 2052  AST 18  ALT <5  ALKPHOS 99  BILITOT 0.5  PROT 6.3*   ALBUMIN 3.3*   No results for input(s): LIPASE, AMYLASE in the last 168 hours. Recent Labs  Lab 08/16/18 2052  AMMONIA 26    CBC: Recent Labs  Lab 08/16/18 2313 08/17/18 1956 08/20/18 0433 08/21/18 0654  WBC 5.2 6.0 5.6 7.4  NEUTROABS 3.6  --   --   --   HGB 9.3* 11.0* 10.8* 10.8*  HCT 29.5* 34.2* 33.3* 33.5*  MCV 96.1 93.4 91.5 91.5  PLT 95* 122* 136* 137*    Cardiac Enzymes: Recent Labs  Lab 08/16/18 2052  TROPONINI <0.03    Lipid Panel: No results for input(s): CHOL, TRIG, HDL, CHOLHDL, VLDL, LDLCALC in the last 168 hours.  CBG: Recent Labs  Lab 08/18/18 1034 08/19/18 0747 08/20/18 0749 08/21/18 0738 08/21/18 0835  GLUCAP 111* 112* 75 68* 129*    Microbiology: Results for orders placed or performed during the hospital encounter of 08/16/18  Urine Culture     Status: Abnormal   Collection Time: 08/16/18  8:52 PM  Result Value Ref Range Status   Specimen Description   Final    URINE, CATHETERIZED Performed at [facility], [address] [place], [place] [address]    Special Requests   Final    Normal Performed at [facility], [address] [place], [place] [address]    Culture >=100,000 COLONIES/mL KLEBSIELLA PNEUMONIAE (A)  Final   Report Status  08/19/2018 FINAL  Final   Organism ID, Bacteria KLEBSIELLA PNEUMONIAE (A)  Final      Susceptibility   Klebsiella pneumoniae - MIC*    AMPICILLIN >=32 RESISTANT Resistant     CEFAZOLIN <=4 SENSITIVE Sensitive     CEFTRIAXONE <=1 SENSITIVE Sensitive     CIPROFLOXACIN <=0.25 SENSITIVE Sensitive     GENTAMICIN <=1 SENSITIVE Sensitive     IMIPENEM <=0.25 SENSITIVE Sensitive     NITROFURANTOIN 128 RESISTANT Resistant     TRIMETH/SULFA <=20 SENSITIVE Sensitive     AMPICILLIN/SULBACTAM 8 SENSITIVE Sensitive     PIP/TAZO <=4 SENSITIVE Sensitive     Extended ESBL NEGATIVE Sensitive     * >=100,000 COLONIES/mL KLEBSIELLA PNEUMONIAE  MRSA PCR Screening     Status: None   Collection  Time: 08/17/18  1:14 PM  Result Value Ref Range Status   MRSA by PCR NEGATIVE NEGATIVE Final    Comment:        The GeneXpert MRSA Assay (FDA approved for NASAL specimens only), is one component of a comprehensive MRSA colonization surveillance program. It is not intended to diagnose MRSA infection nor to guide or monitor treatment for MRSA infections. Performed at [facility], [address] [place], [place] [address]     Coagulation Studies: No results for input(s): LABPROT, INR in the last 72 hours.  Imaging: No results found.  Medications:  I have reviewed the patient's current medications. Scheduled: . acidophilus  1 capsule Oral BID  . amLODipine  10 mg Oral Daily  . aspirin EC  325 mg Oral Daily  . carbidopa-levodopa  1 tablet Oral TID  . dextrose      . docusate sodium  100 mg Oral BID  . donepezil  10 mg Oral QHS  . escitalopram  10 mg Oral Daily  . ferrous sulfate  325 mg Oral BID WC  . fluticasone furoate-vilanterol  1 puff Inhalation Daily  . heparin  5,000 Units Subcutaneous Q8H  . hydrALAZINE  10 mg Oral Q8H  . multivitamin-lutein  1 capsule Oral Daily  . nitroGLYCERIN  0.5 inch Topical Q6H  . RESOURCE THICKENUP CLEAR  1 g Oral TID PC  . umeclidinium bromide  1 puff Inhalation Daily  . vitamin B-12  1,000 mcg Oral Daily    Assessment/Plan: Patient remains poorly responsive.  EEG consistent with patient being post-ictal but somewhat irritable.  Vimpat increased.    Recommendations: 1. Continue Vimpat at current dose.  Will re-eval in AM and determine need for further adjustment   LOS: 5 days   Thana Farr, MD Neurology [phone] 08/21/2018  12:20 PM

## 2018-08-22 MED ORDER — CLONIDINE HCL 0.1 MG/24HR TD PTWK
0.1000 mg | MEDICATED_PATCH | TRANSDERMAL | Status: DC
  Filled 2018-08-22: qty 1

## 2018-08-22 MED ORDER — MORPHINE SULFATE (CONCENTRATE) 10 MG/0.5ML PO SOLN: 10.0000 mg | ORAL | Status: DC | PRN

## 2018-08-22 MED ORDER — LORAZEPAM 2 MG/ML IJ SOLN
0.5000 mg | INTRAMUSCULAR | Status: DC | PRN
  Filled 2018-08-22: qty 1

## 2018-08-22 MED ORDER — GLYCOPYRROLATE 0.2 MG/ML IJ SOLN
0.4000 mg | Freq: Four times a day (QID) | INTRAMUSCULAR | Status: DC | PRN
  Filled 2018-08-22: qty 2

## 2018-08-22 NOTE — Progress Notes (Signed)
Pt unresponsive most of the night, responds to pain at times. Pt BP elevated through-out the night. MD aware, new parameters given. Will continue to monitor.  

## 2018-08-22 NOTE — Plan of Care (Signed)
Pt BP elevated throughout the night, but stable. MD aware.

## 2018-08-22 NOTE — Progress Notes (Signed)
Subjective: Patient remains poorly responsive.  No further seizure activity noted.    Objective: Current vital signs: BP (!) 183/75 (BP Location: Right Arm)   Pulse 78   Temp 99.7 F (37.6 C) (Axillary)   Resp 16   Wt 63 kg   SpO2 93%   BMI 25.40 kg/m  Vital signs in last 24 hours: Temp:  [97.9 F (36.6 C)-99.8 F (37.7 C)] 99.7 F (37.6 C) (11/03 0852) Pulse Rate:  [68-89] 78 (11/03 0852) Resp:  [16-22] 16 (11/03 0852) BP: (162-192)/(75-110) 183/75 (11/03 0852) SpO2:  [93 %-97 %] 93 % (11/03 0852) Weight:  [63 kg] 63 kg (11/03 0511)  Intake/Output from previous day: 11/02 0701 - 11/03 0700 In: 1384.7 [I.V.:810.3; IV Piggyback:574.3] Out: 700 [Urine:700] Intake/Output this shift: No intake/output data recorded. Nutritional status:  Diet Order            DIET DYS 2 Room service appropriate? Yes; Fluid consistency: Thin  Diet effective now              Neurologic Exam: Mental Status: Sleeping when I enter the room.  Does not follow commands.  Mumbles with stimulation but no intelligible speech noted.  Resists movement Cranial Nerves: II: Discs flat bilaterally;blinks to threat bilaterally, pupils equal, round, reactive to light and accommodation III,IV, VI: ptosis not present, extra-ocular motions intact bilaterally V,VII: weak corneals bilaterally VIII: unable to test IX,X: gag reflex present [id number] to test XII: unable to test Motor: Moves upper extremities in response to noxious stimuli Sensory: Responds to noxious stimuli in the extremities  Lab Results: Basic Metabolic Panel: Recent Labs  Lab 08/16/18 2052 08/17/18 1329 08/20/18 0433 08/21/18 0654  NA 142 142 142 140  K 4.4 4.1 4.0 4.0  CL 109 107 110 108  CO2 24 26 23  19*  GLUCOSE 124* 101* 88 77  BUN 30* 26* 26* 30*  CREATININE 1.89* 1.41* 1.64* 1.78*  CALCIUM 8.2* 8.7* 8.5* 8.5*    Liver Function Tests: Recent Labs  Lab 08/16/18 2052  AST 18  ALT <5  ALKPHOS 99  BILITOT 0.5   PROT 6.3*  ALBUMIN 3.3*   No results for input(s): LIPASE, AMYLASE in the last 168 hours. Recent Labs  Lab 08/16/18 2052  AMMONIA 26    CBC: Recent Labs  Lab 08/16/18 2313 08/17/18 1956 08/20/18 0433 08/21/18 0654  WBC 5.2 6.0 5.6 7.4  NEUTROABS 3.6  --   --   --   HGB 9.3* 11.0* 10.8* 10.8*  HCT 29.5* 34.2* 33.3* 33.5*  MCV 96.1 93.4 91.5 91.5  PLT 95* 122* 136* 137*    Cardiac Enzymes: Recent Labs  Lab 08/16/18 2052  TROPONINI <0.03    Lipid Panel: No results for input(s): CHOL, TRIG, HDL, CHOLHDL, VLDL, LDLCALC in the last 168 hours.  CBG: Recent Labs  Lab 08/18/18 1034 08/19/18 0747 08/20/18 0749 08/21/18 0738 08/21/18 0835  GLUCAP 111* 112* 75 68* 129*    Microbiology: Results for orders placed or performed during the hospital encounter of 08/16/18  Urine Culture     Status: Abnormal   Collection Time: 08/16/18  8:52 PM  Result Value Ref Range Status   Specimen Description   Final    URINE, CATHETERIZED Performed at [facility], [address], [place], [place] [address]    Special Requests   Final    Normal Performed at [facility], [address] [place], [place] [address]    Culture >=100,000 COLONIES/mL KLEBSIELLA PNEUMONIAE (A)  Final   Report Status 08/19/2018 FINAL  Final   Organism ID, Bacteria KLEBSIELLA PNEUMONIAE (A)  Final      Susceptibility   Klebsiella pneumoniae - MIC*    AMPICILLIN >=32 RESISTANT Resistant     CEFAZOLIN <=4 SENSITIVE Sensitive     CEFTRIAXONE <=1 SENSITIVE Sensitive     CIPROFLOXACIN <=0.25 SENSITIVE Sensitive     GENTAMICIN <=1 SENSITIVE Sensitive     IMIPENEM <=0.25 SENSITIVE Sensitive     NITROFURANTOIN 128 RESISTANT Resistant     TRIMETH/SULFA <=20 SENSITIVE Sensitive     AMPICILLIN/SULBACTAM 8 SENSITIVE Sensitive     PIP/TAZO <=4 SENSITIVE Sensitive     Extended ESBL NEGATIVE Sensitive     * >=100,000 COLONIES/mL KLEBSIELLA PNEUMONIAE  MRSA PCR Screening     Status: None    Collection Time: 08/17/18  1:14 PM  Result Value Ref Range Status   MRSA by PCR NEGATIVE NEGATIVE Final    Comment:        The GeneXpert MRSA Assay (FDA approved for NASAL specimens only), is one component of a comprehensive MRSA colonization surveillance program. It is not intended to diagnose MRSA infection nor to guide or monitor treatment for MRSA infections. Performed at [facility], [address] [place], [place] [address]     Coagulation Studies: No results for input(s): LABPROT, INR in the last 72 hours.  Imaging: No results found.  Medications:  I have reviewed the patient's current medications. Scheduled: . acidophilus  1 capsule Oral BID  . amLODipine  10 mg Oral Daily  . aspirin EC  325 mg Oral Daily  . carbidopa-levodopa  1 tablet Oral TID  . cloNIDine  0.1 mg Transdermal Weekly  . docusate sodium  100 mg Oral BID  . donepezil  10 mg Oral QHS  . escitalopram  10 mg Oral Daily  . ferrous sulfate  325 mg Oral BID WC  . fluticasone furoate-vilanterol  1 puff Inhalation Daily  . heparin  5,000 Units Subcutaneous Q8H  . hydrALAZINE  10 mg Oral Q8H  . multivitamin-lutein  1 capsule Oral Daily  . nitroGLYCERIN  0.5 inch Topical Q6H  . RESOURCE THICKENUP CLEAR  1 g Oral TID PC  . umeclidinium bromide  1 puff Inhalation Daily  . vitamin B-12  1,000 mcg Oral Daily    Assessment/Plan: Patient remains poorly responsive on Vimpat 100mg  q 12 hours.  Some worsening renal function noted and likely secondary to the patient not taking po.    Recommendations: 1.  Repeat EEG in AM 2.  Continue Vimpat at current dose   LOS: 6 days   Thana Farr, MD Neurology [phone] 08/22/2018  12:53 PM

## 2018-08-22 NOTE — Plan of Care (Signed)
  Problem: Education: Goal: Knowledge of General Education information will improve Description Including pain rating scale, medication(s)/side effects and non-pharmacologic comfort measures Outcome: Not Progressing   Problem: Health Behavior/Discharge Planning: Goal: Ability to manage health-related needs will improve Outcome: Not Progressing   Problem: Clinical Measurements: Goal: Ability to maintain clinical measurements within normal limits will improve Outcome: Not Progressing   Problem: Education: Goal: Expressions of having a comfortable level of knowledge regarding the disease process will increase Outcome: Not Progressing   Problem: Coping: Goal: Ability to adjust to condition or change in health will improve Outcome: Not Progressing   Problem: Problem: Skin/Wound Progression Goal: Adequate nutrition will be maintained Outcome: Not Progressing

## 2018-08-22 NOTE — Progress Notes (Signed)
[employer] [facility] - [place] at [facility]   PATIENT NAME: Stacey Baker    MR#:  [id number]  DATE OF BIRTH:  11-24-1931  SUBJECTIVE:  CHIEF COMPLAINT:   Chief Complaint  Patient presents with  . Seizures   -No change, remains about the same.  Not responding -Updated son over the phone this morning.  Likely transition to comfort care after discussing with rest of the family.  REVIEW OF SYSTEMS:  Review of Systems  Unable to perform ROS: Patient unresponsive    DRUG ALLERGIES:   Allergies  Allergen Reactions  . Penicillins Other (See Comments)    "Allergic," per [facility] Has patient had a PCN reaction causing immediate rash, facial/tongue/throat swelling, SOB or lightheadedness with hypotension: Unk Has patient had a PCN reaction causing severe rash involving mucus membranes or skin necrosis: Unk Has patient had a PCN reaction that required hospitalization: Unk Has patient had a PCN reaction occurring within the last 10 years: Unk If all of the above answers are "NO", then may proceed with Cephalosporin use.   Marland Kitchen Plavix [Clopidogrel Bisulfate] Other (See Comments)    "Allergic," per [facility]    VITALS:  Blood pressure (!) 183/75, pulse 78, temperature 99.7 F (37.6 C), temperature source Axillary, resp. rate 16, weight 63 kg, SpO2 93 %.  PHYSICAL EXAMINATION:  Physical Exam  GENERAL:  84 y.o.-year-old patient lying in the bed, not very responsive EYES: Pupils equal, round, reactive to light and accommodation. No scleral icterus. Extraocular muscles intact.  HEENT: Head atraumatic, normocephalic. Oropharynx and nasopharynx clear.  NECK:  Supple, no jugular venous distention. No thyroid enlargement, no tenderness.  LUNGS: Normal breath sounds bilaterally, no wheezing, rales,rhonchi or crepitation. No use of accessory muscles of respiration. Decreased bibasilar breath sounds CARDIOVASCULAR: S1, S2 normal. No  rubs, or gallops. 3/6 systolic murmur present ABDOMEN: Soft,  nontender, nondistended. Bowel sounds present. No organomegaly or mass.  EXTREMITIES: No pedal edema, cyanosis, or clubbing.  NEUROLOGIC: Unresponsive, arousable to palpation but not tracking or following any commands PSYCHIATRIC: The patient is lethargic SKIN: No obvious rash, lesion, or ulcer.    LABORATORY PANEL:   CBC Recent Labs  Lab 08/21/18 0654  WBC 7.4  HGB 10.8*  HCT 33.5*  PLT 137*   ------------------------------------------------------------------------------------------------------------------  Chemistries  Recent Labs  Lab 08/16/18 2052  08/21/18 0654  NA 142   < > 140  K 4.4   < > 4.0  CL 109   < > 108  CO2 24   < > 19*  GLUCOSE 124*   < > 77  BUN 30*   < > 30*  CREATININE 1.89*   < > 1.78*  CALCIUM 8.2*   < > 8.5*  AST 18  --   --   ALT <5  --   --   ALKPHOS 99  --   --   BILITOT 0.5  --   --    < > = values in this interval not displayed.   ------------------------------------------------------------------------------------------------------------------  Cardiac Enzymes Recent Labs  Lab 08/16/18 2052  TROPONINI <0.03   ------------------------------------------------------------------------------------------------------------------  RADIOLOGY:  No results found.  EKG:   Orders placed or performed during the hospital encounter of 08/16/18  . EKG 12-Lead  . EKG 12-Lead  . ED EKG  . ED EKG  . EKG 12-Lead  . EKG 12-Lead    ASSESSMENT AND PLAN:   84 year old female with past medical history significant for dementia, hypertension, Parkinson's disease and recent admission to [facility]  Hospital for stroke was brought in secondary to altered mental status  1.  Metabolic encephalopathy acute-likely secondary to seizure and postictal state -Appreciate neurology consult.  Keppra was discontinued and started on Vimpat -Loaded with Vimpat again and started on 100 mg twice daily  -EEG was abnormal with post ictal state changes. -MRI of the brain  with no acute findings.  2.  Klebsiella UTI-on Rocephin, finished 5 days trt  3.  Parkinson's disease-on Sinemet  4.  Dementia-on Aricept.  Also on Lexapro  5.  DVT prophylaxis-on subcu heparin  6. Poor oral intake- due to her underlying neurological condition- change fluids to D5 1/2NS as sugars low  7.  Hypertension-unable to take oral medications.  Started on clonidine patch.  Also IV hydralazine PRN.  Patient is from assisted living facility and is followed by hospice  Discussed at length with son Tylee Yum- he will talk to rest of the family and wants to see if Ms. Donner can be comfort care   All the records are reviewed and case discussed with Care Management/Social Workerr. Management plans discussed with the patient, family and they are in agreement.  CODE STATUS: DNR  TOTAL TIME TAKING CARE OF THIS PATIENT: 35 minutes.   POSSIBLE D/C IN 2-3 DAYS, DEPENDING ON CLINICAL CONDITION.   Enid Baas M.D on 08/22/2018 at 11:09 AM  Between 7am to 6pm - Pager - [phone]  After 6pm go to [email or web address] - Social research officer, government  [facility]  Office  [phone]  CC: Primary care physician; Merlene Pulling, PA-C

## 2018-08-22 NOTE — Progress Notes (Signed)
   [employer] - [place] at [facility]   Advance care planning  Hospital Day: 6 days Stacey Baker is a 84 y.o. female presenting with Seizures .   Advance care planning discussed with patients son Stacey Baker over the phone. Current medical condition, overall treatment plan and prognosis have been explained in detail.  Son is concerned about patient's recovery and her not improving. He wants to pursue comfort care and hospice home if possible.  All questions in regards to overall condition and expected prognosis answered. The decision was made to continue current code status and change to comfort care  CODE STATUS: DNR Time spent: 18 minutes

## 2018-08-23 MED ORDER — MORPHINE SULFATE (CONCENTRATE) 10 MG/0.5ML PO SOLN: 10.0000 mg | ORAL | Status: AC | PRN

## 2018-08-23 NOTE — Clinical Social Work Note (Signed)
CSW noted in chart review that patient's family has decided to make patient comfort care and would like hospice home placement. CSW contacted patient's son Sherica Paternostro [phone] to obtain choice of facility. Son states that he would like patient to go to [facility] hospice home. CSW explained transfer process and that bed may not be available today. CSW notified Clydie Braun, hospice liaison of referral. CSW will continue to follow for discharge planning.   Ruthe Mannan MSW, [address] [phone]

## 2018-08-23 NOTE — Discharge Summary (Signed)
[employer] -  at [facility]   PATIENT NAME: Stacey Baker    MR#:  [id number]  DATE OF BIRTH:  1932-05-25  DATE OF ADMISSION:  08/16/2018 ADMITTING PHYSICIAN: Cammy Copa, MD  DATE OF DISCHARGE: 08/23/2018  PRIMARY CARE PHYSICIAN: Merlene Pulling, PA-C   ADMISSION DIAGNOSIS:  Seizure-like activity (HCC) [R56.9] Urinary tract infection without hematuria, site unspecified [N39.0] Altered mental status, unspecified altered mental status type [R41.82]  DISCHARGE DIAGNOSIS:  Active Problems:   Acute UTI   SECONDARY DIAGNOSIS:   Past Medical History:  Diagnosis Date  . Apraxia following cerebral infarction   . CKD (chronic kidney disease), stage III (HCC)    a. With acute worsening/AKI noted on several admissions.  . Dementia (HCC)   . Dementia without behavioral disturbance (HCC)   . Depression   . Encephalopathy   . Hyperkalemia    a. 08/2015  . Hyperlipidemia   . Hyperlipidemia   . Hypertension   . Iron deficiency anemia   . Major depressive disorder   . Muscle weakness   . Parkinson disease (HCC)   . Parkinson's disease (HCC)   . Pericardial effusion    a. 06/2016 Echo: Ef 60-65%, no rwma, Gr1 DD, mild MR, mildly dil LA, PASP , mod circumferential pericardial effusion - no hemodynamic compromise.  . Sinus bradycardia    a. 08/2015 in setting of beta blocker and hyperkalemia.  . Stroke (HCC)   . TIA (transient ischemic attack)    a. 08/2015 - essentially neg w/u for stroke.  Marland Kitchen UTI (urinary tract infection)      ADMITTING HISTORY  HISTORY OF PRESENT ILLNESS: Stacey Baker  is a 84 y.o. female with a known history of dementia, hypertension, hyperlipidemia, Parkinson's and recent stroke, for which she was admitted to [facility] 5 days ago. Patient was sent to emergency room from nursing home for altered mental status.  The reports from nursing home state that patient was observed with possible seizure-like activity, followed by  postictal phase. During my examination in the emergency room, patient has been lethargic and confused, but following some commands and answering some questions. Blood test done emergency room are notable for creatinine at 1.89, BUN 30, glucose 124, WBC 5.2.  UA is positive for UTI. No acute abnormalities per brain CT. Patient is admitted for further evaluation and treatment.  HOSPITAL COURSE:   84 year old female with past medical history significant for dementia, hypertension, Parkinson's disease and recent admission to [facility] for stroke was brought in secondary to altered mental status  1.  Metabolic encephalopathy acute -Seizures 2.  Klebsiella UTI 3.  Parkinson's disease 4.  Dementia 6.  Dehydration with hypernatremia 7.  Hypertension  Patient was admitted to medical floor.  Keppra was changed to Vimpat.  EEG showed postictal state.  Patient was also treated with IV antibiotics for UTI.  In spite of aggressive antiseizure medications, antibiotics patient continued to deteriorate.  She developed dehydration and hypernatremia.  Remained unresponsive with no oral intake.  Case was discussed with treatment plan and prognosis with son who chose to transfer patient to hospice home for end-of-life care.  CONSULTS OBTAINED:  Treatment Team:  Kym Groom, MD Thana Farr, MD  DRUG ALLERGIES:   Allergies  Allergen Reactions  . Penicillins Other (See Comments)    "Allergic," per [facility] Has patient had a PCN reaction causing immediate rash, facial/tongue/throat swelling, SOB or lightheadedness with hypotension: Unk Has patient had a PCN reaction causing severe rash involving mucus  membranes or skin necrosis: Unk Has patient had a PCN reaction that required hospitalization: Unk Has patient had a PCN reaction occurring within the last 10 years: Unk If all of the above answers are "NO", then may proceed with Cephalosporin use.   Marland Kitchen Plavix [Clopidogrel Bisulfate] Other (See  Comments)    "Allergic," per [facility]    DISCHARGE MEDICATIONS:   Allergies as of 08/23/2018      Reactions   Penicillins Other (See Comments)   "Allergic," per [facility] Has patient had a PCN reaction causing immediate rash, facial/tongue/throat swelling, SOB or lightheadedness with hypotension: Unk Has patient had a PCN reaction causing severe rash involving mucus membranes or skin necrosis: Unk Has patient had a PCN reaction that required hospitalization: Unk Has patient had a PCN reaction occurring within the last 10 years: Unk If all of the above answers are "NO", then may proceed with Cephalosporin use.   Plavix [clopidogrel Bisulfate] Other (See Comments)   "Allergic," per [facility]      Medication List    STOP taking these medications   acetaminophen 325 MG tablet Commonly known as:  TYLENOL   amLODipine 10 MG tablet Commonly known as:  NORVASC   aspirin EC 325 MG tablet   BAZA ANTIFUNGAL 2 % cream Generic drug:  miconazole   BREO ELLIPTA 100-25 MCG/INH Aepb Generic drug:  fluticasone furoate-vilanterol   calcitRIOL 0.25 MCG capsule Commonly known as:  ROCALTROL   carbidopa-levodopa 25-100 MG tablet Commonly known as:  SINEMET IR   donepezil 10 MG tablet Commonly known as:  ARICEPT   EQ SALINE SOLUTION/SENSITIVE Soln   escitalopram 10 MG tablet Commonly known as:  LEXAPRO   ferrous sulfate 325 (65 FE) MG tablet   INCRUSE ELLIPTA 62.5 MCG/INH Aepb Generic drug:  umeclidinium bromide   levETIRAcetam 250 MG tablet Commonly known as:  KEPPRA   loperamide 2 MG capsule Commonly known as:  IMODIUM   LORazepam 0.5 MG tablet Commonly known as:  ATIVAN   MIRALAX PO   PRESERVISION AREDS 2 PO   PROBIOTIC DAILY Caps   QUEtiapine 25 MG tablet Commonly known as:  SEROQUEL   RESOURCE THICKENUP CLEAR Powd   vitamin B-12 1000 MCG tablet Commonly known as:  CYANOCOBALAMIN     TAKE these medications   morphine CONCENTRATE 10 MG/0.5ML Soln concentrated solution Take  0.5 mLs (10 mg total) by mouth every 2 (two) hours as needed for moderate pain, severe pain or shortness of breath. What changed:    medication strength  how much to take  when to take this  reasons to take this       Today   VITAL SIGNS:  Blood pressure (!) 183/92, pulse 74, temperature (!) 100.7 F (38.2 C), temperature source Axillary, resp. rate 16, weight 62.8 kg, SpO2 95 %.  I/O:  No intake or output data in the 24 hours ending 08/23/18 0952  PHYSICAL EXAMINATION:  Physical Exam  GENERAL:  84 y.o.-year-old patient lying in the bed unresponsive. S1,S2  DATA REVIEW:   CBC Recent Labs  Lab 08/21/18 0654  WBC 7.4  HGB 10.8*  HCT 33.5*  PLT 137*    Chemistries  Recent Labs  Lab 08/16/18 2052  08/21/18 0654  NA 142   < > 140  K 4.4   < > 4.0  CL 109   < > 108  CO2 24   < > 19*  GLUCOSE 124*   < > 77  BUN 30*   < >  30*  CREATININE 1.89*   < > 1.78*  CALCIUM 8.2*   < > 8.5*  AST 18  --   --   ALT <5  --   --   ALKPHOS 99  --   --   BILITOT 0.5  --   --    < > = values in this interval not displayed.    Cardiac Enzymes Recent Labs  Lab 08/16/18 2052  TROPONINI <0.03    Microbiology Results  Results for orders placed or performed during the hospital encounter of 08/16/18  Urine Culture     Status: Abnormal   Collection Time: 08/16/18  8:52 PM  Result Value Ref Range Status   Specimen Description   Final    URINE, CATHETERIZED Performed at [facility], [address] [place], [place] [address]    Special Requests   Final    Normal Performed at [facility], [address] [place], [place] [address]    Culture >=100,000 COLONIES/mL KLEBSIELLA PNEUMONIAE (A)  Final   Report Status 08/19/2018 FINAL  Final   Organism ID, Bacteria KLEBSIELLA PNEUMONIAE (A)  Final      Susceptibility   Klebsiella pneumoniae - MIC*    AMPICILLIN >=32 RESISTANT Resistant     CEFAZOLIN <=4 SENSITIVE Sensitive     CEFTRIAXONE <=1  SENSITIVE Sensitive     CIPROFLOXACIN <=0.25 SENSITIVE Sensitive     GENTAMICIN <=1 SENSITIVE Sensitive     IMIPENEM <=0.25 SENSITIVE Sensitive     NITROFURANTOIN 128 RESISTANT Resistant     TRIMETH/SULFA <=20 SENSITIVE Sensitive     AMPICILLIN/SULBACTAM 8 SENSITIVE Sensitive     PIP/TAZO <=4 SENSITIVE Sensitive     Extended ESBL NEGATIVE Sensitive     * >=100,000 COLONIES/mL KLEBSIELLA PNEUMONIAE  MRSA PCR Screening     Status: None   Collection Time: 08/17/18  1:14 PM  Result Value Ref Range Status   MRSA by PCR NEGATIVE NEGATIVE Final    Comment:        The GeneXpert MRSA Assay (FDA approved for NASAL specimens only), is one component of a comprehensive MRSA colonization surveillance program. It is not intended to diagnose MRSA infection nor to guide or monitor treatment for MRSA infections. Performed at [facility], [address], [place], [place] [address]     RADIOLOGY:  No results found.  Follow up with PCP in 1 week.  Management plans discussed with the patient, family and they are in agreement.  CODE STATUS:     Code Status Orders  (From admission, onward)         Start     Ordered   08/17/18 0856  Do not attempt resuscitation (DNR)  Continuous    Question Answer Comment  In the event of cardiac or respiratory ARREST Do not call a "code blue"   In the event of cardiac or respiratory ARREST Do not perform Intubation, CPR, defibrillation or ACLS   In the event of cardiac or respiratory ARREST Use medication by any route, position, wound care, and other measures to relive pain and suffering. May use oxygen, suction and manual treatment of airway obstruction as needed for comfort.      08/17/18 0856        Code Status History    Date Active Date Inactive Code Status Order ID Comments User Context   08/13/2018 0318 08/16/2018 1650 DNR [id number]  John Giovanni, MD Inpatient   08/13/2018 0318 08/13/2018 0318 DNR [id number]  Loney Loh,  Ulyess Blossom, MD Inpatient   02/26/2017 2325 03/09/2017 1922 DNR [id number]  Katharina Caper, MD Inpatient   07/05/2016 1747 07/09/2016 1951 Full Code [id number]  Marguarite Arbour, MD ED   07/03/2016 1104 07/03/2016 1726 Full Code [id number]  Milagros Loll, MD ED   12/31/2015 1742 01/06/2016 1912 Full Code [id number]  [name], MD Inpatient   09/13/2015 2255 09/15/2015 2025 Full Code [id number]  Oralia Manis, MD Inpatient   08/28/2015 0814 08/29/2015 1649 DNR [id number]  Delfino Lovett, MD Inpatient   08/27/2015 2325 08/28/2015 0814 Full Code [id number]  Hower, Cletis Athens, MD ED      TOTAL TIME TAKING CARE OF THIS PATIENT ON DAY OF DISCHARGE: more than 30 minutes.   Orie Fisherman M.D on 08/23/2018 at 9:52 AM  Between 7am to 6pm - Pager - [phone]  After 6pm go to [email or web address] - password [email or web address]  [facility]  Office  [phone]  CC: Primary care physician; Merlene Pulling, PA-C  Note: This dictation was prepared with Dragon dictation along with smaller phrase technology. Any transcriptional errors that result from this process are unintentional.

## 2018-08-23 NOTE — Progress Notes (Signed)
Per chart note review, patient was made "comfort care " after attending physician spoke with her son. Patient has had several days with no oral intake, all medications discontinued yesterday. Patent seen lying in bed, eyes opened when uncovered and her feet were moved, skin very warm and dry. Patient opened her eyes briefly, but did not follow commands. Writer spoke on the phone with her son  Leonette Most, he is in agreement with transfer to the hospice home. Hospital care team updated and aware. Updated notes and discharge summary faxed to referral. Hospice team updated. Report called to the hospice home, EMS notified for transport. Singed DNR in place in discharge packet.Thank you. Dayna Barker RN, BSN, [facility], hospital liaison [phone]

## 2018-09-19 DEATH — deceased

## 2019-04-06 IMAGING — MR MR HEAD W/O CM
9 of 10 series · 40 of 48 positions shown · non-contrast
Comparison: CT head without contrast 01/30/2017. MRI brain
07/03/2016.

CLINICAL DATA: Stroke like symptoms. Leaning to the right. Visual
changes. The patient attributes right-sided weakness to her
Parkinson's disease.

EXAM:
MRI HEAD WITHOUT CONTRAST
TECHNIQUE: Multiplanar, multiecho pulse sequences of the brain and surrounding
structures were obtained without intravenous contrast.

[Series 3: GRE · sagittal · 5.0mm · 0.45mm/px · 3 of 27 slices shown (1 of 2)]
[im 1/27]
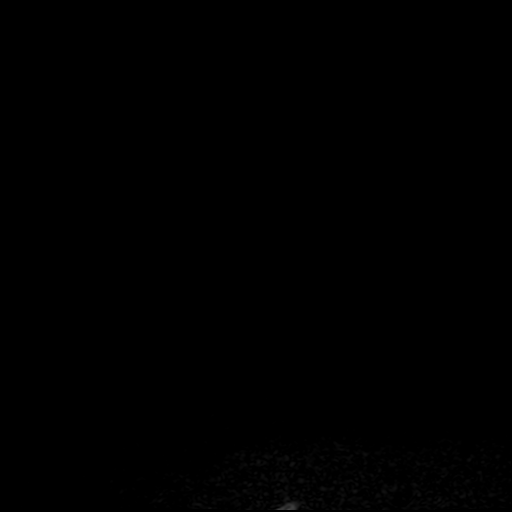
[im 14/27]
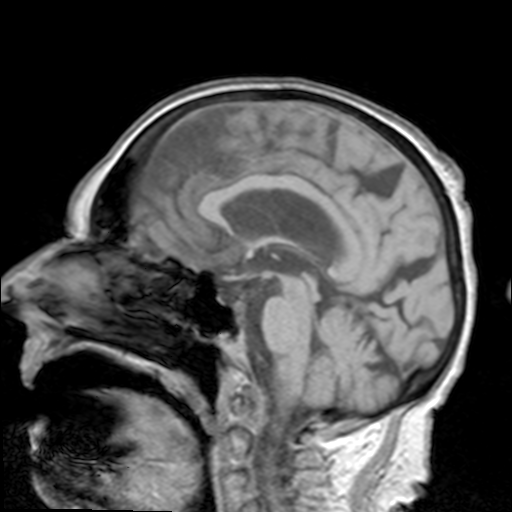
[im 27/27]
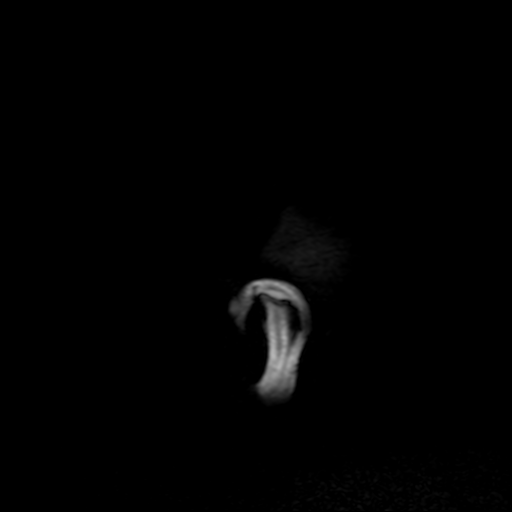

[Series 5: DWI · axial · 3.0mm · 1.80mm/px · z∈[-50,+90]mm · 7 of 54 slices shown (1 of 2)]
[im 1/54]
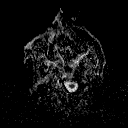
[im 9/54]
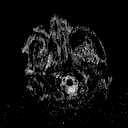
[im 18/54]
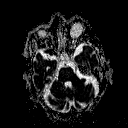
[im 27/54]
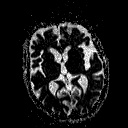
[im 36/54]
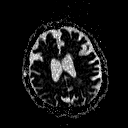
[im 45/54]
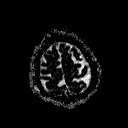
[im 54/54]
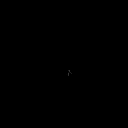

[Series 7: DWI · coronal · 3.0mm · 1.80mm/px · 6 of 49 slices shown (2 of 2)]
[im 1/49]
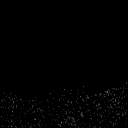
[im 10/49]
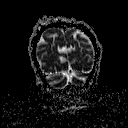
[im 20/49]
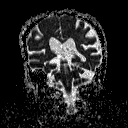
[im 29/49]
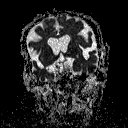
[im 39/49]
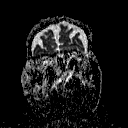
[im 49/49]
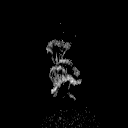

[Series 8: T2 · axial · 5.0mm · 0.45mm/px · z∈[-47,+90]mm · 3 of 25 slices shown (1 of 3)]
[im 1/25]
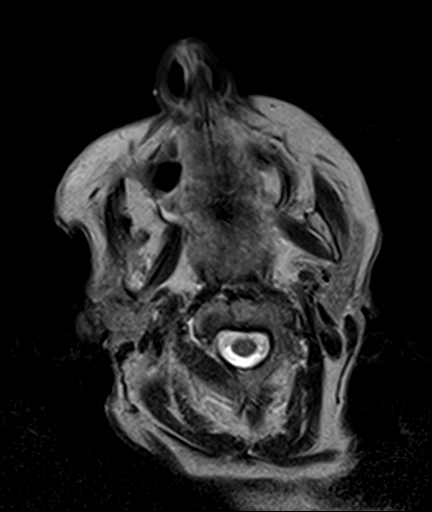
[im 13/25]
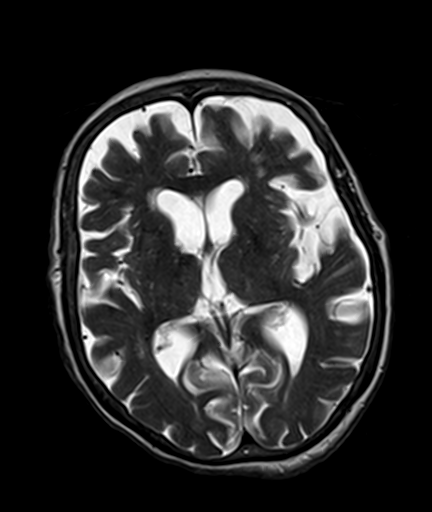
[im 25/25]
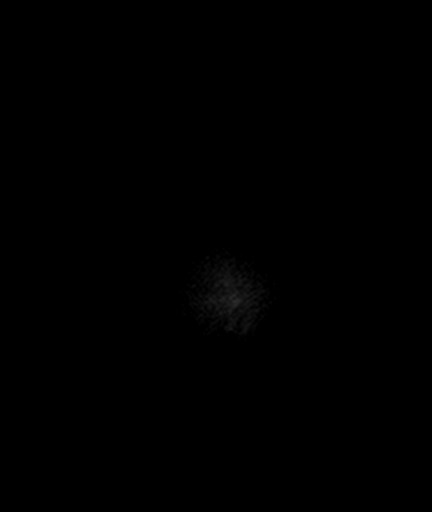

[Series 9: FLAIR · axial · 3.0mm · 0.45mm/px · z∈[-47,+90]mm · 7 of 53 slices shown]
[im 1/53]
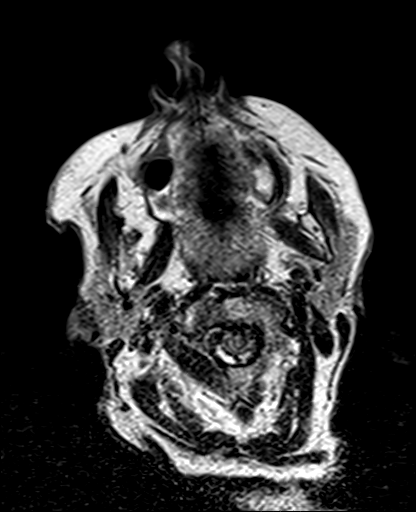
[im 9/53]
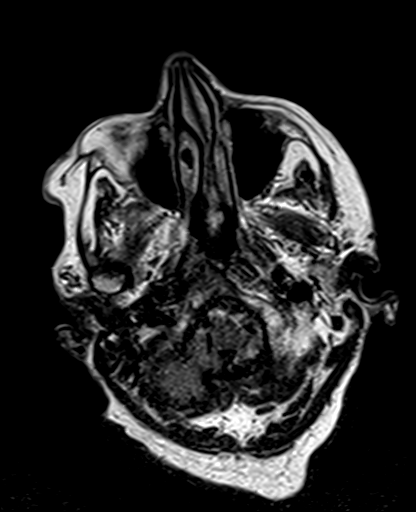
[im 18/53]
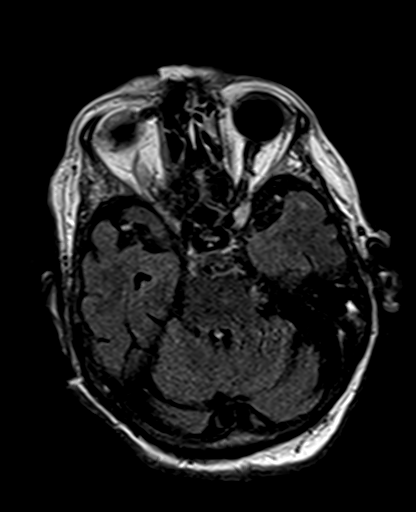
[im 27/53]
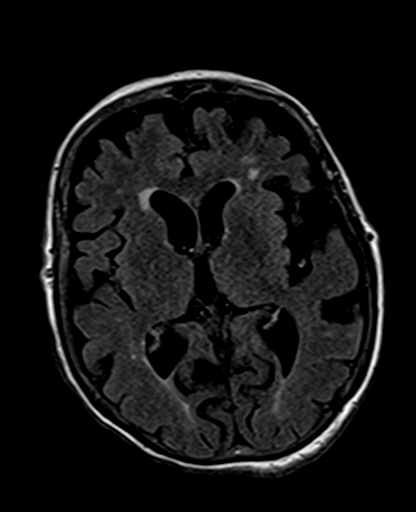
[im 35/53]
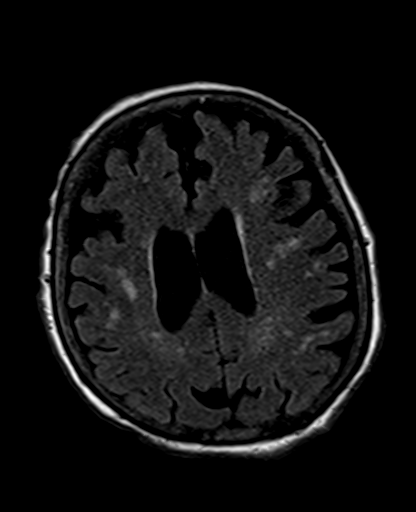
[im 44/53]
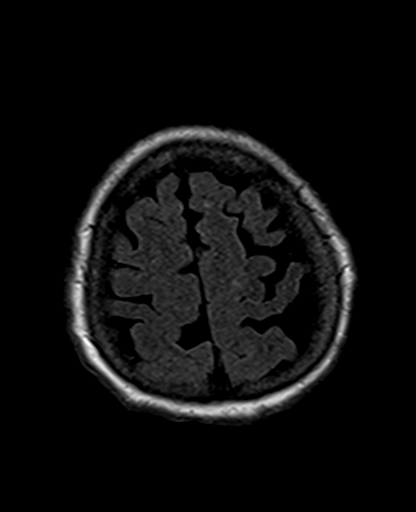
[im 53/53]
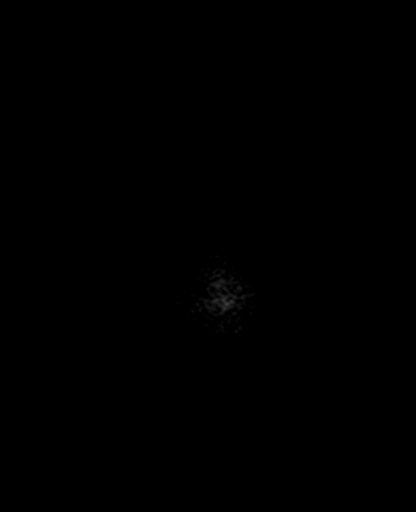

[Series 10: T2 · axial · 5.0mm · 1.20mm/px · z∈[-47,+90]mm · 3 of 25 slices shown (2 of 3)]
[im 1/25]
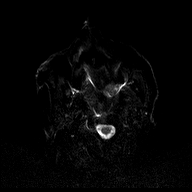
[im 13/25]
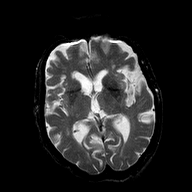
[im 25/25]
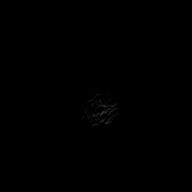

[Series 11: GRE · axial · 5.0mm · 0.45mm/px · z∈[-47,+90]mm · 3 of 25 slices shown (2 of 2)]
[im 1/25]
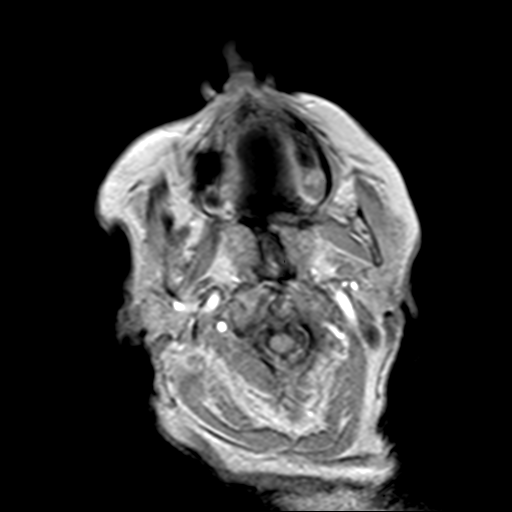
[im 13/25]
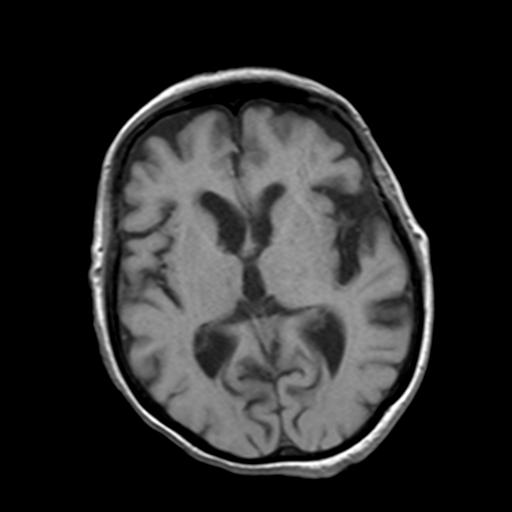
[im 25/25]
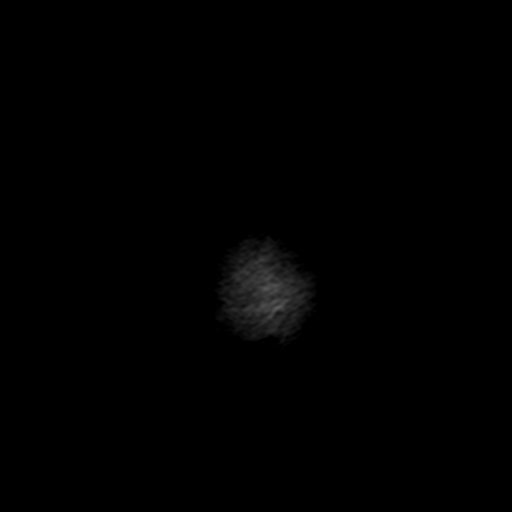

[Series 12: T2 · coronal · 5.0mm · 0.45mm/px · 3 of 27 slices shown (3 of 3)]
[im 1/27]
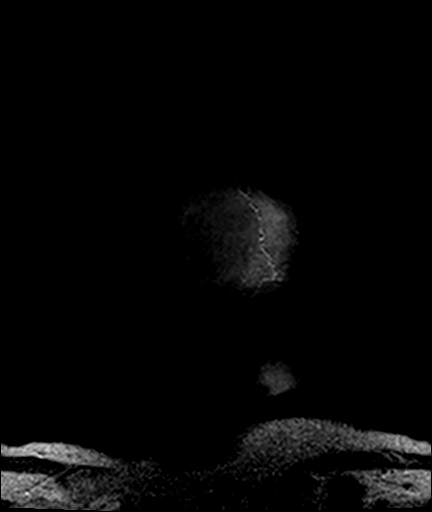
[im 14/27]
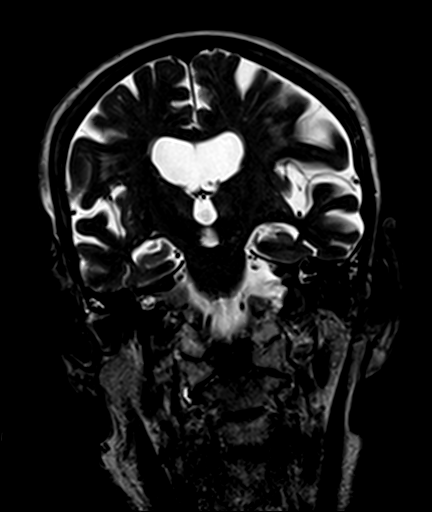
[im 27/27]
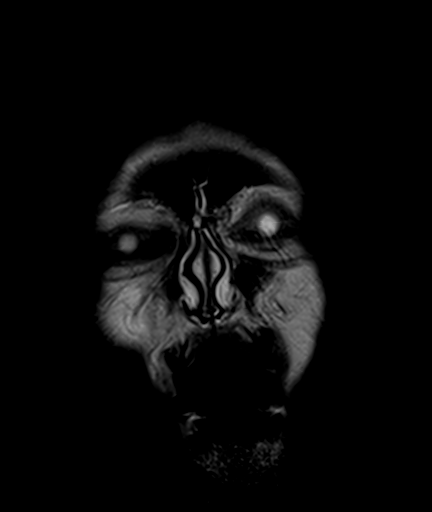

[Series 100: ax (id) · axial · 3.0mm · 1.80mm/px · z∈[-50,+45]mm · 5 of 55 slices shown]
[im 1/55]
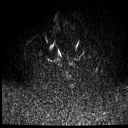
[im 10/55]
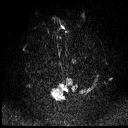
[im 19/55]
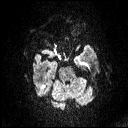
[im 28/55]
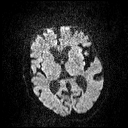
[im 37/55]
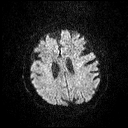

[40 of 48 positions shown; findings below may reference images not displayed]

FINDINGS: Brain: The diffusion-weighted images demonstrate no acute or
subacute infarction. There is no hemorrhage or mass lesion. Moderate
generalized atrophy and white matter disease is similar to the prior
exam. Ventricular dilation is proportionate to the degree of atrophy
and stable. No significant extra-axial fluid collection is present.
The brainstem and cerebellum are normal.

Vascular: Flow is present in the major intracranial arteries.

Skull and upper cervical spine: The skullbase is within normal
limits. The craniocervical junction is unremarkable. Midline
sagittal structures are within normal limits.

Sinuses/Orbits: Mild mucosal thickening is present in the right
maxillary sinus. There is mild mucosal thickening in the anterior
left ethmoid air cells and inferior left frontal sinus. Mild mucosal
thickening is present in the posterior right ethmoid air cells.
Small mastoid effusions are present bilaterally. No obstructing
nasopharyngeal lesion is present. Bilateral lens replacements. The
globes and orbits are otherwise within normal limits.
IMPRESSION: 1. No acute intracranial abnormality or significant interval
changes.
2. Stable atrophy and white matter disease.
